# Patient Record
Sex: Male | Born: 1935
Health system: Southern US, Community
[De-identification: ages and names within clinical notes are randomized; demographics above are authoritative.]

## PROBLEM LIST (undated history)

## (undated) DIAGNOSIS — I10 Essential (primary) hypertension: Secondary | ICD-10-CM

## (undated) DIAGNOSIS — I739 Peripheral vascular disease, unspecified: Secondary | ICD-10-CM

## (undated) DIAGNOSIS — J449 Chronic obstructive pulmonary disease, unspecified: Secondary | ICD-10-CM

## (undated) DIAGNOSIS — I82409 Acute embolism and thrombosis of unspecified deep veins of unspecified lower extremity: Secondary | ICD-10-CM

## (undated) DIAGNOSIS — E785 Hyperlipidemia, unspecified: Secondary | ICD-10-CM

## (undated) DIAGNOSIS — I2510 Atherosclerotic heart disease of native coronary artery without angina pectoris: Secondary | ICD-10-CM

## (undated) HISTORY — DX: Essential (primary) hypertension: I10

## (undated) HISTORY — DX: Chronic obstructive pulmonary disease, unspecified: J44.9

## (undated) HISTORY — DX: Atherosclerotic heart disease of native coronary artery without angina pectoris: I25.10

## (undated) HISTORY — PX: TONSILLECTOMY: SUR1361

## (undated) HISTORY — PX: COLONOSCOPY: SHX174

## (undated) HISTORY — DX: Acute embolism and thrombosis of unspecified deep veins of unspecified lower extremity: I82.409

## (undated) HISTORY — DX: Hyperlipidemia, unspecified: E78.5

## (undated) HISTORY — DX: Peripheral vascular disease, unspecified: I73.9

---

## 1992-01-03 HISTORY — PX: CARDIAC CATHETERIZATION: SHX172

## 2004-09-14 ENCOUNTER — Encounter: Admission: RE | Admit: 2004-09-14 | Discharge: 2004-09-14 | Payer: Self-pay | Admitting: Orthopedic Surgery

## 2004-09-15 ENCOUNTER — Ambulatory Visit (HOSPITAL_COMMUNITY): Admission: RE | Admit: 2004-09-15 | Discharge: 2004-09-15 | Payer: Self-pay | Admitting: Orthopedic Surgery

## 2004-09-15 ENCOUNTER — Ambulatory Visit (HOSPITAL_BASED_OUTPATIENT_CLINIC_OR_DEPARTMENT_OTHER): Admission: RE | Admit: 2004-09-15 | Discharge: 2004-09-15 | Payer: Self-pay | Admitting: Orthopedic Surgery

## 2005-05-31 ENCOUNTER — Emergency Department (HOSPITAL_COMMUNITY): Admission: EM | Admit: 2005-05-31 | Discharge: 2005-05-31 | Payer: Self-pay | Admitting: Emergency Medicine

## 2009-01-02 DIAGNOSIS — I82409 Acute embolism and thrombosis of unspecified deep veins of unspecified lower extremity: Secondary | ICD-10-CM

## 2009-01-02 HISTORY — DX: Acute embolism and thrombosis of unspecified deep veins of unspecified lower extremity: I82.409

## 2009-02-26 ENCOUNTER — Observation Stay (HOSPITAL_COMMUNITY): Admission: EM | Admit: 2009-02-26 | Discharge: 2009-02-27 | Payer: Self-pay | Admitting: Emergency Medicine

## 2009-02-26 ENCOUNTER — Ambulatory Visit: Payer: Self-pay | Admitting: Vascular Surgery

## 2009-02-26 ENCOUNTER — Ambulatory Visit (HOSPITAL_COMMUNITY): Admission: RE | Admit: 2009-02-26 | Discharge: 2009-02-26 | Payer: Self-pay | Admitting: Family Medicine

## 2009-02-26 ENCOUNTER — Encounter (INDEPENDENT_AMBULATORY_CARE_PROVIDER_SITE_OTHER): Payer: Self-pay | Admitting: Family Medicine

## 2009-08-03 ENCOUNTER — Encounter: Admission: RE | Admit: 2009-08-03 | Discharge: 2009-08-03 | Payer: Self-pay | Admitting: Family Medicine

## 2010-03-23 LAB — CBC
HCT: 39.4 % (ref 39.0–52.0)
Hemoglobin: 13.2 g/dL (ref 13.0–17.0)
MCHC: 33.5 g/dL (ref 30.0–36.0)
MCV: 90.1 fL (ref 78.0–100.0)
Platelets: 211 10*3/uL (ref 150–400)
RBC: 4.37 MIL/uL (ref 4.22–5.81)
RDW: 14.2 % (ref 11.5–15.5)
WBC: 8.1 10*3/uL (ref 4.0–10.5)

## 2010-03-23 LAB — PROTIME-INR
INR: 1.02 (ref 0.00–1.49)
INR: 1.08 (ref 0.00–1.49)
Prothrombin Time: 13.3 seconds (ref 11.6–15.2)
Prothrombin Time: 13.9 seconds (ref 11.6–15.2)

## 2010-03-23 LAB — DIFFERENTIAL
Basophils Absolute: 0 10*3/uL (ref 0.0–0.1)
Basophils Relative: 1 % (ref 0–1)
Eosinophils Absolute: 0.2 10*3/uL (ref 0.0–0.7)
Eosinophils Relative: 3 % (ref 0–5)
Lymphocytes Relative: 21 % (ref 12–46)
Lymphs Abs: 1.7 10*3/uL (ref 0.7–4.0)
Monocytes Absolute: 0.7 10*3/uL (ref 0.1–1.0)
Monocytes Relative: 8 % (ref 3–12)
Neutro Abs: 5.5 10*3/uL (ref 1.7–7.7)
Neutrophils Relative %: 68 % (ref 43–77)

## 2010-03-23 LAB — BASIC METABOLIC PANEL
BUN: 13 mg/dL (ref 6–23)
CO2: 28 mEq/L (ref 19–32)
Calcium: 8.9 mg/dL (ref 8.4–10.5)
Chloride: 107 mEq/L (ref 96–112)
Creatinine, Ser: 0.77 mg/dL (ref 0.4–1.5)
GFR calc Af Amer: >60 mL/min (ref >60)
GFR calc non Af Amer: >60 mL/min (ref >60)
Glucose, Bld: 107 mg/dL — ABNORMAL HIGH (ref 70–99)
Potassium: 3.4 mEq/L — ABNORMAL LOW (ref 3.5–5.1)
Sodium: 141 mEq/L (ref 135–145)

## 2010-05-02 ENCOUNTER — Other Ambulatory Visit: Payer: Self-pay | Admitting: Gastroenterology

## 2010-05-06 ENCOUNTER — Ambulatory Visit
Admission: RE | Admit: 2010-05-06 | Discharge: 2010-05-06 | Disposition: A | Discharge status: Home / Self Care | Payer: Medicare Other | Source: Ambulatory Visit | Attending: Gastroenterology | Admitting: Gastroenterology

## 2010-05-20 NOTE — Op Note (Signed)
NAME:  Ernest Clark, Ernest Clark NO.:  [id number]   MEDICAL RECORD NO.:  [id number]          PATIENT TYPE:  AMB   LOCATION:  DSC                          FACILITY:  [facility]   PHYSICIAN:  Cindee Salt, M.D.       DATE OF BIRTH:  1931-01-03   DATE OF PROCEDURE:  09/15/2004  DATE OF DISCHARGE:                                 OPERATIVE REPORT   PREOPERATIVE DIAGNOSIS:  Carpal tunnel syndrome right hand.   POSTOPERATIVE DIAGNOSIS:  Carpal tunnel syndrome right hand.   OPERATION:  Decompression right median nerve.   SURGEON:  Kuzma.   ASSISTANT:  Carolyne Fiscal R.N.   ANESTHESIA:  Forearm based IV regional.   HISTORY:  The patient is a 77 year old male with a history of carpal tunnel  syndrome EMG nerve conductions positive which has not responded to  conservative treatment.   PROCEDURE:  The patient is brought to the operating room where a forearm  based IV regional anesthetic was carried out without difficulty. He was  prepped using DuraPrep, supine position, right arm free. Longitudinal  incision was made in the palm and carried down through subcutaneous tissue.  Bleeders were electrocauterized. The palmar fascia was split, superficial  palmar arch identified.  The flexor tendon to the ring and little finger  identified to the ulnar side of median nerve.  The carpal retinaculum was  incised with sharp dissection. A right angle and Sewell retractor placed  between skin and forearm fascia. The fascia was released for approximately a  centimeter and a half proximal to the wrist crease under direct vision.  Canal was explored.  The tenosynovial tissue was thickened. Area of  compression to the nerve was apparent. No further lesions were identified.  The wound was irrigated. The skin then closed with interrupted 5-0 nylon  sutures. Sterile compressive dressing and splint was applied. The patient  tolerated the procedure well and was taken to the recovery for observation  in satisfactory  condition. He is discharged home to return to the [facility] [place] in 1 week on Vicodin.           ______________________________  Cindee Salt, M.D.     GK/MEDQ  D:  09/15/2004  T:  09/15/2004  Job:  [id number]

## 2010-12-08 ENCOUNTER — Encounter: Payer: Self-pay | Admitting: Internal Medicine

## 2010-12-09 ENCOUNTER — Ambulatory Visit (INDEPENDENT_AMBULATORY_CARE_PROVIDER_SITE_OTHER): Payer: Medicare Other | Admitting: Internal Medicine

## 2010-12-09 ENCOUNTER — Encounter: Payer: Self-pay | Admitting: Internal Medicine

## 2010-12-09 DIAGNOSIS — I10 Essential (primary) hypertension: Secondary | ICD-10-CM

## 2010-12-09 DIAGNOSIS — J449 Chronic obstructive pulmonary disease, unspecified: Secondary | ICD-10-CM | POA: Insufficient documentation

## 2010-12-09 MED ORDER — OLMESARTAN MEDOXOMIL 20 MG PO TABS: 20.0000 mg | ORAL_TABLET | Freq: Every day | ORAL | Status: DC

## 2010-12-09 NOTE — Progress Notes (Signed)
  Subjective:    Patient ID: Ernest Clark, male    DOB: December 29, 1931, 77 y.o.   MRN: [id number]  HPI  77 yowm quit smoking around 2002 due to cough which resolved and after seasonal problems with asthma fine for months at a time but had trouble with extreme heat or in fall at a weight of 190 then within a few years  gained significant wt and breathing deteriorated ever since.     12/09/2010 Hodaya Curto/ 1st pulmonary eval for a pattern on ACEI cc  worsening breathing x 7 years with occ nighttime sob but every time sob walking to mailbox x 30 yards stops and rests before rests no better on dulrera -  Has also seen Ramon Dredge with dysphagia but taking meds for it, not sure what, and some better s overt hb.  Minimal cough in am, thick and white with persistent daily sense of chest and sinus congestion but no purulent sputum- some better with saba. No h/o childhood allergies or asthma.  Sleeping ok without nocturnal  or early am exacerbation  of respiratory  c/o's or need for noct saba. Also denies any obvious fluctuation of symptoms with weather or environmental changes or other aggravating or alleviating factors except as outlined above    Review of Systems  Constitutional: Negative for fever, chills, activity change, appetite change and unexpected weight change.  HENT: Positive for congestion. Negative for sore throat, rhinorrhea, sneezing, trouble swallowing, dental problem, voice change and postnasal drip.   Eyes: Negative for visual disturbance.  Respiratory: Positive for cough and shortness of breath. Negative for choking.   Cardiovascular: Negative for chest pain and leg swelling.  Gastrointestinal: Negative for nausea, vomiting and abdominal pain.  Genitourinary: Negative for difficulty urinating.  Musculoskeletal: Negative for arthralgias.  Skin: Negative for rash.  Psychiatric/Behavioral: Negative for behavioral problems and confusion.       Objective:   Physical Exam  Obese wm nad  Wt 262  12/09/2010  HEENT mild turbinate edema.  Oropharynx no thrush or excess pnd or cobblestoning.  No JVD or cervical adenopathy. Mild accessory muscle hypertrophy. Trachea midline, nl thryroid. Chest was hyperinflated by percussion with diminished breath sounds and moderate increased exp time without wheeze. Hoover sign positive at mid inspiration. Regular rate and rhythm without murmur gallop or rub or increase P2 or edema.  Abd: no hsm, nl excursion. Ext warm without cyanosis or clubbing.      cxr report 01/10/10 Mild hyperinflation/ no active dz Assessment & Plan:

## 2010-12-09 NOTE — Patient Instructions (Signed)
Stop lisinopril/ Zesteril/ Prinivil  Benicar 20 mg one each am in place of the lisinopril -if too strong break it in half and only take a half in am.  Stay on dulera Take 2 puffs first thing in am and then another 2 puffs about 12 hours later.   Only use your albuterol(proaire)  as a rescue medication to be used if you can't catch your breath by resting or doing a relaxed purse lip breathing pattern. The less you use it, the better it will work when you need it.   Stop fish oil  Please schedule a follow up office visit in 6 weeks, call sooner if needed with PFT's

## 2010-12-11 NOTE — Assessment & Plan Note (Addendum)
-   HFA 25% 12/09/2010     -12/09/10  Walked RA x 3 laps @ 185 ft each stopped due to  End of study, no desats  COPD appears to be only mild to moderate at most but need pft's to confirm  When respiratory symptoms flare  well after a patient reports complete smoking cessation,  it is very hard to "blame" COPD specifically or airways disorders in general  ie it doesn't make any more sense than hearing a  [employer] driver wrecked his car while driving his kids to school or a surgeon sliced his hand off carving roast beef (it must be rare indeed!)     That is to say, once the high risk activity stops,  the symptoms should not suddenly erupt or markedly worsen.  If so, the differential diagnosis should include  obesity/deconditioning,  LPR/Reflux/Aspiration syndromes,  occult CHF, or  especially side effect of medications commonly used in this population like ACE inhibitors - not clear what role their playing but only way to know is stop them for 6 weeks then return for pft's.  The proper method of use, as well as anticipated side effects, of this metered-dose inhaler are discussed and demonstrated to the patient. Improved only tol 75% with coaching so needs lots more work.    Each maintenance medication was reviewed in detail including most importantly the difference between maintenance and as needed and under what circumstances the prns are to be used.  Please see instructions for details which were reviewed in writing and the patient given a copy.

## 2010-12-11 NOTE — Assessment & Plan Note (Signed)

## 2011-01-26 ENCOUNTER — Ambulatory Visit (INDEPENDENT_AMBULATORY_CARE_PROVIDER_SITE_OTHER): Payer: Medicare Other | Admitting: Internal Medicine

## 2011-01-26 ENCOUNTER — Encounter: Payer: Self-pay | Admitting: Internal Medicine

## 2011-01-26 VITALS — BP 138/70 | HR 101 | Temp 97.7°F | Ht 68.0 in | Wt 256.0 lb

## 2011-01-26 DIAGNOSIS — I10 Essential (primary) hypertension: Secondary | ICD-10-CM | POA: Diagnosis not present

## 2011-01-26 DIAGNOSIS — J449 Chronic obstructive pulmonary disease, unspecified: Secondary | ICD-10-CM

## 2011-01-26 LAB — PULMONARY FUNCTION TEST

## 2011-01-26 MED ORDER — OLMESARTAN MEDOXOMIL 40 MG PO TABS: ORAL_TABLET | ORAL | Status: DC

## 2011-01-26 NOTE — Assessment & Plan Note (Signed)
Adequate control on present rx, reviewed options, doing well on benicar if can afford it under his health plan, otherwise could use alternative generic arb

## 2011-01-26 NOTE — Assessment & Plan Note (Signed)
-   HFA 25% 12/09/2010  > 90% 01/26/2011     -12/09/10  Walked RA x 3 laps @ 185 ft each stopped due to  End of study, no desats   - PFT's 01/26/2011  FEV1  1.71 (68%) ratio 63 improved 13% p B2 > DLCO 87%   ERV 56%  GOLD II with significant reversibility and suboptimal  The proper method of use, as well as anticipated side effects, of this metered-dose inhaler are discussed and demonstrated to the patient. Improved to 90% with extensive coaching but this wasn't consistent.    Each maintenance medication was reviewed in detail including most importantly the difference between maintenance and as needed and under what circumstances the prns are to be used.  Please see instructions for details which were reviewed in writing and the patient given a copy.

## 2011-01-26 NOTE — Progress Notes (Signed)
PFT done today. 

## 2011-01-26 NOTE — Patient Instructions (Signed)
Work on inhaler technique:  relax and gently blow all the way out then take a nice smooth deep breath back in, triggering the inhaler at same time you start breathing in.  Hold for up to 5 seconds if you can.  Rinse and gargle with water when done   If your mouth or throat starts to bother you,   I suggest you time the inhaler to your dental care and after using the inhaler(s) brush teeth and tongue with a baking soda containing toothpaste and when you rinse this out, gargle with it first to see if this helps your mouth and throat.     Only use your albuterol (PROAIRE/ RED INHALER) as a rescue medication to be used if you can't catch your breath by resting or doing a relaxed purse lip breathing pattern. The less you use it, the better it will work when you need it.    Benicar 40 mg one half daily  Please schedule a follow up office visit in 4 weeks, sooner if needed

## 2011-01-26 NOTE — Progress Notes (Signed)
  Subjective:    Patient ID: Ernest Clark, male    DOB: 08-21-1931   MRN: [id number]  HPI  Brief patient profile:  79 yowm quit smoking around 2002 due to cough which resolved and after seasonal problems with asthma fine for months at a time but had trouble with extreme heat or in fall at a weight of 190 then within a few years  gained significant wt and breathing deteriorated ever since.     12/09/2010 Ernest Clark/ 1st pulmonary eval for a pattern on ACEI cc  worsening breathing x 7 years with occ nighttime sob but every time sob walking to mailbox x 30 yards stops and rests before rests no better on dulrera -  Has also seen Ramon Dredge with dysphagia but taking meds for it, not sure what, and some better s overt hb.  Minimal cough in am, thick and white with persistent daily sense of chest and sinus congestion but no purulent sputum- some better with saba. No h/o childhood allergies or asthma. rec Stop lisinopril/ Zesteril/ Prinivil Benicar 20 mg one each am in place of the lisinopril -if too strong break it in half and only take a half in am. Stay on dulera Take 2 puffs first thing in am and then another 2 puffs about 12 hours later.  Only use your albuterol(proaire)  as a rescue medication  Stop fish oil   01/26/2011 f/u ov/Ernest Clark cc breathing about the same using less albuterol, not really limited by breathing. No cough  Sleeping ok without nocturnal  or early am exacerbation  of respiratory  c/o's or need for noct saba. Also denies any obvious fluctuation of symptoms with weather or environmental changes or other aggravating or alleviating factors except as outlined above   ROS  At present neg for  any significant sore throat, dysphagia, itching, sneezing,  nasal congestion or excess/ purulent secretions,  fever, chills, sweats, unintended wt loss, pleuritic or exertional cp, hempoptysis, orthopnea pnd or leg swelling.  Also denies presyncope, palpitations, heartburn, abdominal pain, nausea, vomiting,  diarrhea  or change in bowel or urinary habits, dysuria,hematuria,  rash, arthralgias, visual complaints, headache, numbness weakness or ataxia.             Objective:   Physical Exam  Obese wm nad  Wt 262 12/09/2010 > 01/26/2011   256  HEENT mild turbinate edema.  Oropharynx no thrush or excess pnd or cobblestoning.  No JVD or cervical adenopathy. Mild accessory muscle hypertrophy. Trachea midline, nl thryroid. Chest was hyperinflated by percussion with diminished breath sounds and moderate increased exp time without wheeze. Hoover sign positive at mid inspiration. Regular rate and rhythm without murmur gallop or rub or increase P2 or edema.  Abd: no hsm, nl excursion. Ext warm without cyanosis or clubbing.      cxr report 01/10/10 Mild hyperinflation/ no active dz Assessment & Plan:

## 2011-02-07 DIAGNOSIS — E119 Type 2 diabetes mellitus without complications: Secondary | ICD-10-CM | POA: Diagnosis not present

## 2011-02-07 DIAGNOSIS — L0291 Cutaneous abscess, unspecified: Secondary | ICD-10-CM | POA: Diagnosis not present

## 2011-02-07 DIAGNOSIS — L0390 Cellulitis, unspecified: Secondary | ICD-10-CM | POA: Diagnosis not present

## 2011-02-23 ENCOUNTER — Encounter: Payer: Self-pay | Admitting: Internal Medicine

## 2011-02-23 ENCOUNTER — Ambulatory Visit (INDEPENDENT_AMBULATORY_CARE_PROVIDER_SITE_OTHER): Payer: Medicare Other | Admitting: Internal Medicine

## 2011-02-23 DIAGNOSIS — J449 Chronic obstructive pulmonary disease, unspecified: Secondary | ICD-10-CM

## 2011-02-23 DIAGNOSIS — I10 Essential (primary) hypertension: Secondary | ICD-10-CM

## 2011-02-23 MED ORDER — MOMETASONE FURO-FORMOTEROL FUM 200-5 MCG/ACT IN AERO: 2.0000 | INHALATION_SPRAY | Freq: Two times a day (BID) | RESPIRATORY_TRACT | Status: DC

## 2011-02-23 NOTE — Progress Notes (Signed)
  Subjective:    Patient ID: Ernest Clark, male    DOB: [birth year]   MRN: [id number]  HPI  Brief patient profile:  79 yowm quit smoking around 2002 due to cough which resolved and after seasonal problems with asthma fine for months at a time but had trouble with extreme heat or in fall at a weight of 190 then within a few years  gained significant wt and breathing deteriorated ever since. Dx GOLD II COPD 01/25/10    12/09/2010 Ernest Clark/ 1st pulmonary eval for a pattern on ACEI cc  worsening breathing x 7 years with occ nighttime sob but every time sob walking to mailbox x 30 yards stops and rests before rests no better on dulrera -  Has also seen Ernest Clark with dysphagia but taking meds for it, not sure what, and some better s overt hb.  Minimal cough in am, thick and white with persistent daily sense of chest and sinus congestion but no purulent sputum- some better with saba. No h/o childhood allergies or asthma. rec Stop lisinopril/ Zesteril/ Prinivil Benicar 20 mg one each am in place of the lisinopril -if too strong break it in half and only take a half in am. Stay on dulera Take 2 puffs first thing in am and then another 2 puffs about 12 hours later.  Only use your albuterol(proaire)  as a rescue medication  Stop fish oil   01/26/2011 f/u ov/Ernest Clark cc breathing about the same using less albuterol, not really limited by breathing. No cough rec Work on inhaler technique:  Only use your albuterol (PROAIRE/ RED INHALER) as a rescue medication  Benicar 40 mg one half daily   02/23/2011 f/u ov/Ernest Clark cc breathing much better  On dulera 200 2bid still feels needs proaire with exertion, no cough. No variability to doe   Sleeping ok without nocturnal  or early am exacerbation  of respiratory  c/o's or need for noct saba. Also denies any obvious fluctuation of symptoms with weather or environmental changes or other aggravating or alleviating factors except as outlined above   ROS  At present neg for   any significant sore throat, dysphagia, itching, sneezing,  nasal congestion or excess/ purulent secretions,  fever, chills, sweats, unintended wt loss, pleuritic or exertional cp, hempoptysis, orthopnea pnd or leg swelling.  Also denies presyncope, palpitations, heartburn, abdominal pain, nausea, vomiting, diarrhea  or change in bowel or urinary habits, dysuria,hematuria,  rash, arthralgias, visual complaints, headache, numbness weakness or ataxia.             Objective:   Physical Exam  Obese wm nad  Wt 262 12/09/2010 > 01/26/2011   256 > 02/23/2011  259 HEENT mild turbinate edema.  Oropharynx no thrush or excess pnd or cobblestoning.  No JVD or cervical adenopathy. Mild accessory muscle hypertrophy. Trachea midline, nl thryroid. Chest was hyperinflated by percussion with diminished breath sounds and moderate increased exp time without wheeze. Hoover sign positive at mid inspiration. Regular rate and rhythm without murmur gallop or rub or increase P2 or edema.  Abd: no hsm, nl excursion. Ext warm without cyanosis or clubbing.      cxr report 01/10/10 Mild hyperinflation/ no active dz Assessment & Plan:

## 2011-02-23 NOTE — Patient Instructions (Signed)
If you are satisfied with your treatment plan let your doctor know and he/she can either refill your medications or you can return here when your prescription runs out.     If in any way you are not 100% satisfied,  please tell us.  If 100% better, tell your friends!  

## 2011-02-25 NOTE — Assessment & Plan Note (Signed)
-   HFA 25% 12/09/2010  > 90% 01/26/2011     -12/09/10  Walked RA x 3 laps @ 185 ft each stopped due to  End of study, no desats   - PFT's 01/26/2011  FEV1  1.71 (68%) ratio 63 improved 13% p B2 > DLCO 87%   ERV 56%  GOLD II copd with asthmatic component eliminated with dulera 200 2bid and atypical symptoms eliminated off the acei  His need for proair is more likely obesity/deconditioning and not related to airways at all, reviewed

## 2011-02-25 NOTE — Assessment & Plan Note (Signed)
Stop acei 12/09/2010 due to ? Pseudoasthma > resolved 01/26/2011   ACE inhibitors are problematic in  pts with airway complaints because  even experienced pulmonologists can't always distinguish ace effects from copd/asthma/pnds/ allergies etc.  By themselves they don't actually cause a problem, much like oxygen can't by itself start a fire, but they certainly serve as a powerful catalyst or enhancer for any "fire"  or inflammatory process in the upper airway, be it caused by an ET  tube or more commonly reflux (especially in the obese or pts with known GERD or who are on biphoshonates) or URI's, due to interference with bradykinin clearance.  The effects of acei on bradykinin levels occurs in 100% of pt's on acei (unless they surreptitiously stop the med!) but the classic cough is only reported in 5%.  This leaves 95% of pts on acei's  with a variety of syndromes including no identifiable symptom in most  vs non-specific symptoms that wax and wane depending on what other insult is occuring at the level of the upper airway.  His symptoms of "airways" problems are so much better off acei I would avoid this class indefinitely in favor of arb but defer final choice of hbp rx to Dr Clarene Duke

## 2011-05-09 DIAGNOSIS — E119 Type 2 diabetes mellitus without complications: Secondary | ICD-10-CM | POA: Diagnosis not present

## 2011-05-09 DIAGNOSIS — E785 Hyperlipidemia, unspecified: Secondary | ICD-10-CM | POA: Diagnosis not present

## 2011-05-09 DIAGNOSIS — N529 Male erectile dysfunction, unspecified: Secondary | ICD-10-CM | POA: Diagnosis not present

## 2011-06-14 DIAGNOSIS — IMO0001 Reserved for inherently not codable concepts without codable children: Secondary | ICD-10-CM | POA: Diagnosis not present

## 2011-06-14 DIAGNOSIS — E669 Obesity, unspecified: Secondary | ICD-10-CM | POA: Diagnosis not present

## 2011-06-14 DIAGNOSIS — R809 Proteinuria, unspecified: Secondary | ICD-10-CM | POA: Diagnosis not present

## 2011-07-25 ENCOUNTER — Other Ambulatory Visit: Payer: Self-pay | Admitting: Internal Medicine

## 2011-07-25 DIAGNOSIS — M7989 Other specified soft tissue disorders: Secondary | ICD-10-CM | POA: Diagnosis not present

## 2011-07-25 DIAGNOSIS — E669 Obesity, unspecified: Secondary | ICD-10-CM | POA: Diagnosis not present

## 2011-07-25 DIAGNOSIS — IMO0001 Reserved for inherently not codable concepts without codable children: Secondary | ICD-10-CM | POA: Diagnosis not present

## 2011-07-25 DIAGNOSIS — R809 Proteinuria, unspecified: Secondary | ICD-10-CM | POA: Diagnosis not present

## 2011-07-25 DIAGNOSIS — M79669 Pain in unspecified lower leg: Secondary | ICD-10-CM

## 2011-07-25 DIAGNOSIS — Z86718 Personal history of other venous thrombosis and embolism: Secondary | ICD-10-CM | POA: Diagnosis not present

## 2011-07-26 ENCOUNTER — Ambulatory Visit
Admission: RE | Admit: 2011-07-26 | Discharge: 2011-07-26 | Disposition: A | Discharge status: Home / Self Care | Payer: Medicare Other | Source: Ambulatory Visit | Attending: Internal Medicine | Admitting: Internal Medicine

## 2011-07-26 DIAGNOSIS — IMO0001 Reserved for inherently not codable concepts without codable children: Secondary | ICD-10-CM | POA: Diagnosis not present

## 2011-07-26 DIAGNOSIS — R609 Edema, unspecified: Secondary | ICD-10-CM | POA: Diagnosis not present

## 2011-07-26 DIAGNOSIS — M79669 Pain in unspecified lower leg: Secondary | ICD-10-CM

## 2011-07-26 DIAGNOSIS — E669 Obesity, unspecified: Secondary | ICD-10-CM | POA: Diagnosis not present

## 2011-07-26 DIAGNOSIS — Z86718 Personal history of other venous thrombosis and embolism: Secondary | ICD-10-CM | POA: Diagnosis not present

## 2011-07-26 DIAGNOSIS — R809 Proteinuria, unspecified: Secondary | ICD-10-CM | POA: Diagnosis not present

## 2011-07-26 DIAGNOSIS — M79609 Pain in unspecified limb: Secondary | ICD-10-CM | POA: Diagnosis not present

## 2011-08-09 DIAGNOSIS — IMO0001 Reserved for inherently not codable concepts without codable children: Secondary | ICD-10-CM | POA: Diagnosis not present

## 2011-10-04 DIAGNOSIS — E669 Obesity, unspecified: Secondary | ICD-10-CM | POA: Diagnosis not present

## 2011-10-04 DIAGNOSIS — E119 Type 2 diabetes mellitus without complications: Secondary | ICD-10-CM | POA: Diagnosis not present

## 2011-10-04 DIAGNOSIS — R809 Proteinuria, unspecified: Secondary | ICD-10-CM | POA: Diagnosis not present

## 2011-10-10 DIAGNOSIS — D3701 Neoplasm of uncertain behavior of lip: Secondary | ICD-10-CM | POA: Diagnosis not present

## 2011-10-10 DIAGNOSIS — H9319 Tinnitus, unspecified ear: Secondary | ICD-10-CM | POA: Diagnosis not present

## 2011-10-10 DIAGNOSIS — H6120 Impacted cerumen, unspecified ear: Secondary | ICD-10-CM | POA: Diagnosis not present

## 2011-10-10 DIAGNOSIS — D3709 Neoplasm of uncertain behavior of other specified sites of the oral cavity: Secondary | ICD-10-CM | POA: Diagnosis not present

## 2011-10-24 DIAGNOSIS — H905 Unspecified sensorineural hearing loss: Secondary | ICD-10-CM | POA: Diagnosis not present

## 2011-10-24 DIAGNOSIS — D3701 Neoplasm of uncertain behavior of lip: Secondary | ICD-10-CM | POA: Diagnosis not present

## 2011-10-24 DIAGNOSIS — D3709 Neoplasm of uncertain behavior of other specified sites of the oral cavity: Secondary | ICD-10-CM | POA: Diagnosis not present

## 2011-12-11 DIAGNOSIS — R05 Cough: Secondary | ICD-10-CM | POA: Diagnosis not present

## 2011-12-11 DIAGNOSIS — E119 Type 2 diabetes mellitus without complications: Secondary | ICD-10-CM | POA: Diagnosis not present

## 2011-12-11 DIAGNOSIS — E669 Obesity, unspecified: Secondary | ICD-10-CM | POA: Diagnosis not present

## 2011-12-11 DIAGNOSIS — R809 Proteinuria, unspecified: Secondary | ICD-10-CM | POA: Diagnosis not present

## 2011-12-11 DIAGNOSIS — R059 Cough, unspecified: Secondary | ICD-10-CM | POA: Diagnosis not present

## 2011-12-12 DIAGNOSIS — J40 Bronchitis, not specified as acute or chronic: Secondary | ICD-10-CM | POA: Diagnosis not present

## 2011-12-12 DIAGNOSIS — J441 Chronic obstructive pulmonary disease with (acute) exacerbation: Secondary | ICD-10-CM | POA: Diagnosis not present

## 2012-01-22 DIAGNOSIS — R0602 Shortness of breath: Secondary | ICD-10-CM | POA: Diagnosis not present

## 2012-01-22 DIAGNOSIS — R0989 Other specified symptoms and signs involving the circulatory and respiratory systems: Secondary | ICD-10-CM | POA: Diagnosis not present

## 2012-03-22 DIAGNOSIS — E1149 Type 2 diabetes mellitus with other diabetic neurological complication: Secondary | ICD-10-CM | POA: Diagnosis not present

## 2012-03-22 DIAGNOSIS — R21 Rash and other nonspecific skin eruption: Secondary | ICD-10-CM | POA: Diagnosis not present

## 2012-03-22 DIAGNOSIS — E669 Obesity, unspecified: Secondary | ICD-10-CM | POA: Diagnosis not present

## 2012-03-25 DIAGNOSIS — M25519 Pain in unspecified shoulder: Secondary | ICD-10-CM | POA: Diagnosis not present

## 2012-03-25 DIAGNOSIS — J449 Chronic obstructive pulmonary disease, unspecified: Secondary | ICD-10-CM | POA: Diagnosis not present

## 2012-04-05 DIAGNOSIS — E669 Obesity, unspecified: Secondary | ICD-10-CM | POA: Diagnosis not present

## 2012-04-05 DIAGNOSIS — R21 Rash and other nonspecific skin eruption: Secondary | ICD-10-CM | POA: Diagnosis not present

## 2012-04-05 DIAGNOSIS — IMO0001 Reserved for inherently not codable concepts without codable children: Secondary | ICD-10-CM | POA: Diagnosis not present

## 2012-06-05 DIAGNOSIS — R21 Rash and other nonspecific skin eruption: Secondary | ICD-10-CM | POA: Diagnosis not present

## 2012-06-05 DIAGNOSIS — E669 Obesity, unspecified: Secondary | ICD-10-CM | POA: Diagnosis not present

## 2012-06-05 DIAGNOSIS — E1149 Type 2 diabetes mellitus with other diabetic neurological complication: Secondary | ICD-10-CM | POA: Diagnosis not present

## 2012-09-05 DIAGNOSIS — E063 Autoimmune thyroiditis: Secondary | ICD-10-CM | POA: Diagnosis not present

## 2012-09-05 DIAGNOSIS — E1149 Type 2 diabetes mellitus with other diabetic neurological complication: Secondary | ICD-10-CM | POA: Diagnosis not present

## 2012-09-05 DIAGNOSIS — E669 Obesity, unspecified: Secondary | ICD-10-CM | POA: Diagnosis not present

## 2012-09-05 DIAGNOSIS — E038 Other specified hypothyroidism: Secondary | ICD-10-CM | POA: Diagnosis not present

## 2012-09-23 DIAGNOSIS — J441 Chronic obstructive pulmonary disease with (acute) exacerbation: Secondary | ICD-10-CM | POA: Diagnosis not present

## 2012-09-23 DIAGNOSIS — J069 Acute upper respiratory infection, unspecified: Secondary | ICD-10-CM | POA: Diagnosis not present

## 2012-09-23 DIAGNOSIS — J449 Chronic obstructive pulmonary disease, unspecified: Secondary | ICD-10-CM | POA: Diagnosis not present

## 2012-11-18 DIAGNOSIS — K219 Gastro-esophageal reflux disease without esophagitis: Secondary | ICD-10-CM | POA: Diagnosis not present

## 2013-03-06 DIAGNOSIS — E063 Autoimmune thyroiditis: Secondary | ICD-10-CM | POA: Diagnosis not present

## 2013-03-06 DIAGNOSIS — E669 Obesity, unspecified: Secondary | ICD-10-CM | POA: Diagnosis not present

## 2013-03-06 DIAGNOSIS — E1149 Type 2 diabetes mellitus with other diabetic neurological complication: Secondary | ICD-10-CM | POA: Diagnosis not present

## 2013-03-06 DIAGNOSIS — Z6835 Body mass index (BMI) 35.0-35.9, adult: Secondary | ICD-10-CM | POA: Diagnosis not present

## 2013-03-06 DIAGNOSIS — E038 Other specified hypothyroidism: Secondary | ICD-10-CM | POA: Diagnosis not present

## 2013-08-06 DIAGNOSIS — E1149 Type 2 diabetes mellitus with other diabetic neurological complication: Secondary | ICD-10-CM | POA: Diagnosis not present

## 2013-08-06 DIAGNOSIS — E1142 Type 2 diabetes mellitus with diabetic polyneuropathy: Secondary | ICD-10-CM | POA: Diagnosis not present

## 2013-08-06 DIAGNOSIS — E063 Autoimmune thyroiditis: Secondary | ICD-10-CM | POA: Diagnosis not present

## 2013-08-06 DIAGNOSIS — E038 Other specified hypothyroidism: Secondary | ICD-10-CM | POA: Diagnosis not present

## 2013-08-06 DIAGNOSIS — Z6834 Body mass index (BMI) 34.0-34.9, adult: Secondary | ICD-10-CM | POA: Diagnosis not present

## 2013-08-06 DIAGNOSIS — E669 Obesity, unspecified: Secondary | ICD-10-CM | POA: Diagnosis not present

## 2013-08-20 ENCOUNTER — Encounter: Payer: Self-pay | Admitting: Podiatry

## 2013-08-20 ENCOUNTER — Ambulatory Visit (INDEPENDENT_AMBULATORY_CARE_PROVIDER_SITE_OTHER): Payer: Medicare Other

## 2013-08-20 ENCOUNTER — Ambulatory Visit (INDEPENDENT_AMBULATORY_CARE_PROVIDER_SITE_OTHER): Payer: Medicare Other | Admitting: Podiatry

## 2013-08-20 ENCOUNTER — Telehealth (HOSPITAL_COMMUNITY): Payer: Self-pay | Admitting: *Deleted

## 2013-08-20 VITALS — BP 133/67 | HR 89 | Resp 12

## 2013-08-20 DIAGNOSIS — M79673 Pain in unspecified foot: Secondary | ICD-10-CM

## 2013-08-20 DIAGNOSIS — M2010 Hallux valgus (acquired), unspecified foot: Secondary | ICD-10-CM

## 2013-08-20 DIAGNOSIS — M79609 Pain in unspecified limb: Secondary | ICD-10-CM

## 2013-08-20 DIAGNOSIS — E1149 Type 2 diabetes mellitus with other diabetic neurological complication: Secondary | ICD-10-CM | POA: Diagnosis not present

## 2013-08-20 MED ORDER — GABAPENTIN 300 MG PO CAPS: 300.0000 mg | ORAL_CAPSULE | Freq: Every day | ORAL | Status: DC

## 2013-08-20 NOTE — Progress Notes (Signed)
   Subjective:    Patient ID: Pauline Aus., male    DOB: July 25, 1931, 80 y.o.   MRN: [id number]  HPI N-SORE, BURNING SENSATION. L-B/L FOOT BUNION D-1 YEAR O-SLOWLY C-WORSE A-PRESSURE T-NONE  ALSO, TOENAILS TRIM IF POSSIBLE. B/L   Review of Systems  Constitutional: Positive for unexpected weight change.  HENT: Positive for hearing loss.   Respiratory: Positive for wheezing.   Musculoskeletal: Positive for gait problem and myalgias.  All other systems reviewed and are negative.      Objective:   Physical Exam  Orientated x3 white male  Vascular: DP and PT pulses 0/4 bilaterally  Neurological: Sensation to 10 g monofilament wire intact 4/5 bilaterally Ankle reflexes equal and reactive bilaterally Vibratory sensation intact bilaterally  Dermatological: Elongated, brittle, discolored toenails 6-10  Varicose veins distal lower leg ankle and dorsal feet bilaterally  Musculoskeletal: HAV deformities bilaterally Atrophic fat-pad MPJs bilaterally without any palpable plantar tenderness  X-ray examination weightbearing left foot  Intact bony structure without any fracture and/or dislocation  HAV deformity  Hammertoe deformity second  Inferior calcaneal spur  Radiographic impression:  No acute bony abnormality noted left foot  X-ray examination right foot weightbearing  Intact bony structure without fracture and/or dislocation  HAV deformity  Hammertoe deformity second  Inferior calcaneal spur  Radiographic impression:  No acute bony abnormality noted in the right foot      Assessment & Plan:   Assessment: Absent pedal pulses bilaterally rule out peripheral arterial disease Diabetic peripheral neuropathy  Plan: Patient referred to vascular lab for lower extremity arterial Doppler for the indication of diabetic with nonpalpable pedal pulses Gabapentin 300 mg #30 take one at bedtime  Reappoint x30 days Notify patient of results of arterial Doppler

## 2013-08-20 NOTE — Patient Instructions (Signed)
The vascular lab we'll contact you to schedule a lower extremity arterial Doppler Begin gabapentin 300 mg at bedtime for burning  Diabetes and Foot Care Diabetes may cause you to have problems because of poor blood supply (circulation) to your feet and legs. This may cause the skin on your feet to become thinner, break easier, and heal more slowly. Your skin may become dry, and the skin may peel and crack. You may also have nerve damage in your legs and feet causing decreased feeling in them. You may not notice minor injuries to your feet that could lead to infections or more serious problems. Taking care of your feet is one of the most important things you can do for yourself.  HOME CARE INSTRUCTIONS  Wear shoes at all times, even in the house. Do not go barefoot. Bare feet are easily injured.  Check your feet daily for blisters, cuts, and redness. If you cannot see the bottom of your feet, use a mirror or ask someone for help.  Wash your feet with warm water (do not use hot water) and mild soap. Then pat your feet and the areas between your toes until they are completely dry. Do not soak your feet as this can dry your skin.  Apply a moisturizing lotion or petroleum jelly (that does not contain alcohol and is unscented) to the skin on your feet and to dry, brittle toenails. Do not apply lotion between your toes.  Trim your toenails straight across. Do not dig under them or around the cuticle. File the edges of your nails with an emery board or nail file.  Do not cut corns or calluses or try to remove them with medicine.  Wear clean socks or stockings every day. Make sure they are not too tight. Do not wear knee-high stockings since they may decrease blood flow to your legs.  Wear shoes that fit properly and have enough cushioning. To break in new shoes, wear them for just a few hours a day. This prevents you from injuring your feet. Always look in your shoes before you put them on to be sure  there are no objects inside.  Do not cross your legs. This may decrease the blood flow to your feet.  If you find a minor scrape, cut, or break in the skin on your feet, keep it and the skin around it clean and dry. These areas may be cleansed with mild soap and water. Do not cleanse the area with peroxide, alcohol, or iodine.  When you remove an adhesive bandage, be sure not to damage the skin around it.  If you have a wound, look at it several times a day to make sure it is healing.  Do not use heating pads or hot water bottles. They may burn your skin. If you have lost feeling in your feet or legs, you may not know it is happening until it is too late.  Make sure your health care provider performs a complete foot exam at least annually or more often if you have foot problems. Report any cuts, sores, or bruises to your health care provider immediately. SEEK MEDICAL CARE IF:   You have an injury that is not healing.  You have cuts or breaks in the skin.  You have an ingrown nail.  You notice redness on your legs or feet.  You feel burning or tingling in your legs or feet.  You have pain or cramps in your legs and feet.  Your  legs or feet are numb.  Your feet always feel cold. SEEK IMMEDIATE MEDICAL CARE IF:   There is increasing redness, swelling, or pain in or around a wound.  There is a red line that goes up your leg.  Pus is coming from a wound.  You develop a fever or as directed by your health care provider.  You notice a bad smell coming from an ulcer or wound. Document Released: 12/17/1999 Document Revised: 08/21/2012 Document Reviewed: 05/28/2012 [facility] Patient Information 2015 [place], [place]. This information is not intended to replace advice given to you by your health care provider. Make sure you discuss any questions you have with your health care provider.

## 2013-08-27 ENCOUNTER — Ambulatory Visit (HOSPITAL_COMMUNITY)
Admission: RE | Admit: 2013-08-27 | Discharge: 2013-08-27 | Disposition: A | Discharge status: Home / Self Care | Payer: Medicare Other | Source: Ambulatory Visit | Attending: Cardiovascular Disease | Admitting: Cardiovascular Disease

## 2013-08-27 DIAGNOSIS — M79609 Pain in unspecified limb: Secondary | ICD-10-CM | POA: Diagnosis not present

## 2013-08-27 DIAGNOSIS — M79673 Pain in unspecified foot: Secondary | ICD-10-CM

## 2013-08-27 DIAGNOSIS — R0989 Other specified symptoms and signs involving the circulatory and respiratory systems: Secondary | ICD-10-CM | POA: Diagnosis not present

## 2013-08-27 NOTE — Progress Notes (Signed)
Lower Extremity Arterial Duplex Completed. °Brianna L Mazza,RVT °

## 2013-09-01 ENCOUNTER — Telehealth: Payer: Self-pay | Admitting: *Deleted

## 2013-09-01 ENCOUNTER — Telehealth (HOSPITAL_COMMUNITY): Payer: Self-pay | Admitting: *Deleted

## 2013-09-01 DIAGNOSIS — I739 Peripheral vascular disease, unspecified: Secondary | ICD-10-CM

## 2013-09-01 NOTE — Telephone Encounter (Signed)
I'm calling on behalf of Ernest Clark.  I'd like to speak to Dr. Phoebe Perch nurse about his vascular ultrasound that he had the other day.  We were wondering if you have any results.  My number is [phone].  You can reach them at their home number but I'm leaving at 10 but he would like me to talk to you so he can understand everything.  Give [place] a call back thank you.

## 2013-09-02 ENCOUNTER — Telehealth: Payer: Self-pay | Admitting: *Deleted

## 2013-09-02 NOTE — Telephone Encounter (Signed)
I called and informed her we have not received the results.  She asked if we would call him when the results came in or can we call her with the results. I told her we will contact the patient when we get the results.

## 2013-09-03 NOTE — Telephone Encounter (Signed)
I informed her that his doppler study was abnormal.  Dr. Amalia Hailey wants to refer him to Dr. Quay Burow for follow-up.  I told her they will call him to schedule the appointment.  She said she will let him know.

## 2013-09-03 NOTE — Telephone Encounter (Signed)
Message copied by Lolita Rieger on Wed Sep 03, 2013 10:21 AM ------      Message from: Gean Birchwood      Created: Tue Sep 02, 2013 11:45 AM       The results of the arterial Doppler dated 08/26/2013            Summary:      This is an abnormal lower extremity arterial Doppler.      Contact Dr. Gwenlyn Found immediately and patient for followup evaluation based on this abnormal arterial Doppler ------

## 2013-09-04 NOTE — Telephone Encounter (Signed)
Dr Gwenlyn Found reviewed patient's dopplers and he needs to come in for an appointment.  Patient notified and has an appt with Dr Gwenlyn Found on 9/24

## 2013-09-17 ENCOUNTER — Ambulatory Visit: Payer: Medicare Other | Admitting: Podiatry

## 2013-09-25 ENCOUNTER — Encounter: Payer: Self-pay | Admitting: Cardiovascular Disease

## 2013-09-25 ENCOUNTER — Ambulatory Visit (INDEPENDENT_AMBULATORY_CARE_PROVIDER_SITE_OTHER): Payer: Medicare Other | Admitting: Cardiovascular Disease

## 2013-09-25 VITALS — BP 135/80 | HR 88 | Ht 68.0 in | Wt 230.2 lb

## 2013-09-25 DIAGNOSIS — E785 Hyperlipidemia, unspecified: Secondary | ICD-10-CM

## 2013-09-25 DIAGNOSIS — I739 Peripheral vascular disease, unspecified: Secondary | ICD-10-CM | POA: Diagnosis not present

## 2013-09-25 DIAGNOSIS — I10 Essential (primary) hypertension: Secondary | ICD-10-CM | POA: Diagnosis not present

## 2013-09-25 DIAGNOSIS — I2510 Atherosclerotic heart disease of native coronary artery without angina pectoris: Secondary | ICD-10-CM | POA: Insufficient documentation

## 2013-09-25 DIAGNOSIS — I2583 Coronary atherosclerosis due to lipid rich plaque: Secondary | ICD-10-CM

## 2013-09-25 NOTE — Assessment & Plan Note (Signed)
The patient was referred by Dr. Amalia Hailey for evaluation of claudication. He had lower extremity arterial Doppler studies performed in our office 08/27/13 revealing a right ABI of 0.65 with moderate SFA disease and left ABI of 0.51 with occluded distal SFA. He does have diabetic peripheral neuropathy as well. Complains of hip and buttock pain primarily while lying flat and sitting which sounds more like cervical claudication. He has no aortoiliac disease which would contribute to the thigh claudication.

## 2013-09-25 NOTE — Progress Notes (Signed)
09/25/2013 Pauline Aus.   Nov 11, 1931  [id number]  Primary Physician LITTLE, Jeanella Craze, MD Primary Cardiologist: Lorretta Harp MD Renae Gloss   HPI:  Mr. Whitenight is a delightful 80 year old moderately overweight divorced Caucasian male father of 2 children, grandfather high grandchildren referred by Dr. Amalia Hailey, podiatrist, for peripheral vascular evaluation because of claudication. He is retired at working as a Furniture conservator/restorer at [employer]. His primary care physician is Dr. Hulan Fess. His cardiac risk factor profile is notable for over 100 pack years of tobacco abuse having quit 5 years ago as well as history of hypertension, diabetes or hyperlipidemia. He has never had a heart attack or stroke. He denies chest pain or shortness of breath. He has had a remote PCI. He also has what sounds like diabetic peripheral neuropathy with burning in his feet. He had Dopplers in our office performed 08/27/13 revealing a right ABI 0.65 with moderate SFA disease, left ABI 0.51 with occluded distal left SFA. He really does not complain of calf claudication.   Current Outpatient Prescriptions  Medication Sig Dispense Refill  . albuterol (PROAIR HFA) 108 (90 BASE) MCG/ACT inhaler Inhale 2 puffs into the lungs every 6 (six) hours as needed.        Marland Kitchen aspirin 81 MG tablet Take 162 mg by mouth daily.        . betamethasone dipropionate (DIPROLENE) 0.05 % cream Apply topically 2 (two) times daily.        Marland Kitchen ezetimibe (ZETIA) 10 MG tablet Take 10 mg by mouth daily.        Marland Kitchen gabapentin (NEURONTIN) 300 MG capsule Take 1 capsule (300 mg total) by mouth at bedtime.  30 capsule  1  . glimepiride (AMARYL) 2 MG tablet Take 2 mg by mouth 2 (two) times daily.        Marland Kitchen HYDROcodone-acetaminophen (NORCO) 5-325 MG per tablet Take 1 tablet by mouth every 6 (six) hours as needed.        . indomethacin (INDOCIN) 25 MG capsule Take 25 mg by mouth as needed.        . Mometasone Furo-Formoterol Fum (DULERA) 200-5  MCG/ACT AERO Inhale 2 puffs into the lungs 2 (two) times daily.  1 Inhaler  11  . montelukast (SINGULAIR) 10 MG tablet Take 10 mg by mouth daily.        Marland Kitchen olmesartan (BENICAR) 40 MG tablet One half daily  15 tablet  5  . rosuvastatin (CRESTOR) 40 MG tablet Take 40 mg by mouth daily.        . sildenafil (VIAGRA) 100 MG tablet Take 100 mg by mouth daily as needed.        . sitaGLIPtin (JANUVIA) 100 MG tablet Take 100 mg by mouth daily.       No current facility-administered medications for this visit.    Allergies  Allergen Reactions  . Actos [Pioglitazone Hydrochloride]   . Metformin And Related     History   Social History  . Marital Status: Divorced    Spouse Name: N/A    Number of Children: 2  . Years of Education: N/A   Occupational History  . retired from [employer]  . Smoking status: Former Smoker -- 2.00 packs/day for 60 years    Types: Cigarettes    Quit date: 01/02/2001  . Smokeless tobacco: Former Systems developer    Types: Chew  . Alcohol Use: No  . Drug Use: No  .  Sexual Activity: Not on file   Other Topics Concern  . Not on file   Social History Narrative  . No narrative on file     Review of Systems: General: negative for chills, fever, night sweats or weight changes.  Cardiovascular: negative for chest pain, dyspnea on exertion, edema, orthopnea, palpitations, paroxysmal nocturnal dyspnea or shortness of breath Dermatological: negative for rash Respiratory: negative for cough or wheezing Urologic: negative for hematuria Abdominal: negative for nausea, vomiting, diarrhea, bright red blood per rectum, melena, or hematemesis Neurologic: negative for visual changes, syncope, or dizziness All other systems reviewed and are otherwise negative except as noted above.    Blood pressure 135/80, pulse 88, height 5\' 8"  (1.727 m), weight 230 lb 3.2 oz (104.418 kg).  General appearance: alert and no distress Neck: no adenopathy, no carotid  bruit, no JVD, supple, symmetrical, trachea midline and thyroid not enlarged, symmetric, no tenderness/mass/nodules Lungs: clear to auscultation bilaterally Heart: regular rate and rhythm, S1, S2 normal, no murmur, click, rub or gallop Extremities: extremities normal, atraumatic, no cyanosis or edema and 1+ pedal pulses bilaterally  EKG normal sinus rhythm at 88 without ST or T wave changes  ASSESSMENT AND PLAN:   HBP (high blood pressure) Well-controlled on current medications  Peripheral arterial disease The patient was referred by Dr. Amalia Hailey for evaluation of claudication. He had lower extremity arterial Doppler studies performed in our office 08/27/13 revealing a right ABI of 0.65 with moderate SFA disease and left ABI of 0.51 with occluded distal SFA. He does have diabetic peripheral neuropathy as well. Complains of hip and buttock pain primarily while lying flat and sitting which sounds more like cervical claudication. He has no aortoiliac disease which would contribute to the thigh claudication.  Coronary artery disease History of remote PCI      Lorretta Harp MD [facility], [facility] 09/25/2013 3:53 PM

## 2013-09-25 NOTE — Patient Instructions (Signed)
Follow up with Dr Berry as needed.  

## 2013-09-25 NOTE — Assessment & Plan Note (Signed)
History of remote PCI

## 2013-09-25 NOTE — Assessment & Plan Note (Signed)
Well-controlled on current medications 

## 2013-10-01 ENCOUNTER — Encounter: Payer: Self-pay | Admitting: Podiatry

## 2013-10-01 ENCOUNTER — Ambulatory Visit (INDEPENDENT_AMBULATORY_CARE_PROVIDER_SITE_OTHER): Payer: Medicare Other | Admitting: Podiatry

## 2013-10-01 VITALS — BP 142/86 | HR 69 | Resp 12

## 2013-10-01 DIAGNOSIS — B351 Tinea unguium: Secondary | ICD-10-CM

## 2013-10-01 DIAGNOSIS — M79676 Pain in unspecified toe(s): Secondary | ICD-10-CM

## 2013-10-01 DIAGNOSIS — M79609 Pain in unspecified limb: Secondary | ICD-10-CM

## 2013-10-01 DIAGNOSIS — E1142 Type 2 diabetes mellitus with diabetic polyneuropathy: Secondary | ICD-10-CM

## 2013-10-01 DIAGNOSIS — E1149 Type 2 diabetes mellitus with other diabetic neurological complication: Secondary | ICD-10-CM

## 2013-10-01 NOTE — Progress Notes (Signed)
   Subjective:    Patient ID: Pauline Aus., male    DOB: 06/27/1931, 80 y.o.   MRN: [id number]  HPI  This patient presents assess the visit of 08/20/2013 the 300 mg gabapentin at night has reduced her painful burning sensation considerably and allows her to sleep much better. He denies any complaints from the gabapentin He was evaluated with Dr. Gwenlyn Found for peripheral arterial disease. He is also complaining of painful toenails and request debridement today and   Review of Systems  Respiratory: Positive for shortness of breath and wheezing.        Objective:   Physical Exam  Orientated x3  The toenails are elongated, hypertrophic, incurvated and tender to palpation  Vascular evaluation: Arterial Doppler 08/27/2013 was abnormal and was evaluated by Dr. Quay Burow  Neurological deferred      Assessment & Plan:   Assessment: Symptomatic onychomycoses 6-10 Peripheral arterial disease under management by Dr. Quay Burow Diabetic peripheral neuropathy  Plan: Debridement toenails x10 without bleeding Maintain gabapentin 300 mg at bedtime Okay to refill x11 months  Reappoint x3 months

## 2013-10-01 NOTE — Patient Instructions (Signed)
Continue gabapentin at night ?

## 2013-10-02 ENCOUNTER — Encounter: Payer: Self-pay | Admitting: Podiatry

## 2013-11-13 ENCOUNTER — Other Ambulatory Visit: Payer: Self-pay | Admitting: Podiatry

## 2013-11-14 ENCOUNTER — Telehealth: Payer: Self-pay | Admitting: *Deleted

## 2013-11-14 NOTE — Telephone Encounter (Signed)
"  Pharmacy at [facility] sent over a refill on his generic Neurontin yesterday.  They haven't received a response yet.  I was wondering if there's a possibility to get that back to them so we could pick that up today.  I'm actually here.  I was thinking that it would be ready by now.  They said they haven't heard from you yet.  So if you wouldn't mind that would be great.  [facility] on [address], if you don't have the fax, you can just call it in.    Prescription was okayed, sent via surescripts.  I called and informed the patient.  "Thank you, I'll pick it up first thing in the morning."

## 2013-11-17 DIAGNOSIS — E1142 Type 2 diabetes mellitus with diabetic polyneuropathy: Secondary | ICD-10-CM | POA: Diagnosis not present

## 2013-11-17 DIAGNOSIS — E039 Hypothyroidism, unspecified: Secondary | ICD-10-CM | POA: Diagnosis not present

## 2013-11-17 DIAGNOSIS — Z23 Encounter for immunization: Secondary | ICD-10-CM | POA: Diagnosis not present

## 2013-11-17 DIAGNOSIS — E063 Autoimmune thyroiditis: Secondary | ICD-10-CM | POA: Diagnosis not present

## 2013-12-24 DIAGNOSIS — I10 Essential (primary) hypertension: Secondary | ICD-10-CM | POA: Diagnosis not present

## 2013-12-24 DIAGNOSIS — E1140 Type 2 diabetes mellitus with diabetic neuropathy, unspecified: Secondary | ICD-10-CM | POA: Diagnosis not present

## 2013-12-24 DIAGNOSIS — E039 Hypothyroidism, unspecified: Secondary | ICD-10-CM | POA: Diagnosis not present

## 2013-12-24 DIAGNOSIS — J45909 Unspecified asthma, uncomplicated: Secondary | ICD-10-CM | POA: Diagnosis not present

## 2013-12-24 DIAGNOSIS — E785 Hyperlipidemia, unspecified: Secondary | ICD-10-CM | POA: Diagnosis not present

## 2013-12-29 ENCOUNTER — Ambulatory Visit: Payer: Medicare Other | Admitting: Podiatry

## 2014-01-14 ENCOUNTER — Encounter: Payer: Self-pay | Admitting: Podiatry

## 2014-01-14 ENCOUNTER — Ambulatory Visit (INDEPENDENT_AMBULATORY_CARE_PROVIDER_SITE_OTHER): Payer: Medicare Other | Admitting: Podiatry

## 2014-01-14 VITALS — BP 118/74 | HR 96 | Resp 12

## 2014-01-14 DIAGNOSIS — B351 Tinea unguium: Secondary | ICD-10-CM

## 2014-01-14 DIAGNOSIS — M79676 Pain in unspecified toe(s): Secondary | ICD-10-CM | POA: Diagnosis not present

## 2014-01-14 NOTE — Progress Notes (Signed)
Patient ID: Ernest Clark., male   DOB: 06-Feb-1931, 81 y.o.   MRN: [id number]  Subjective: As patient presents complaining of painful toenails and requests debridement  Objective: The toenails are hypertrophic elongated, incurvated, discolored and tender palpation 6-10  Assessment: Diabetic with a history of peripheral arterial disease evaluated by Dr. Quay Burow Diabetic peripheral neuropathy Symptomatic onychomycoses 6-10  Plan: Debrided toenails 10 without a bleeding  Reappoint 3 months

## 2014-01-14 NOTE — Patient Instructions (Signed)
Diabetes and Foot Care Diabetes may cause you to have problems because of poor blood supply (circulation) to your feet and legs. This may cause the skin on your feet to become thinner, break easier, and heal more slowly. Your skin may become dry, and the skin may peel and crack. You may also have nerve damage in your legs and feet causing decreased feeling in them. You may not notice minor injuries to your feet that could lead to infections or more serious problems. Taking care of your feet is one of the most important things you can do for yourself.  HOME CARE INSTRUCTIONS  Wear shoes at all times, even in the house. Do not go barefoot. Bare feet are easily injured.  Check your feet daily for blisters, cuts, and redness. If you cannot see the bottom of your feet, use a mirror or ask someone for help.  Wash your feet with warm water (do not use hot water) and mild soap. Then pat your feet and the areas between your toes until they are completely dry. Do not soak your feet as this can dry your skin.  Apply a moisturizing lotion or petroleum jelly (that does not contain alcohol and is unscented) to the skin on your feet and to dry, brittle toenails. Do not apply lotion between your toes.  Trim your toenails straight across. Do not dig under them or around the cuticle. File the edges of your nails with an emery board or nail file.  Do not cut corns or calluses or try to remove them with medicine.  Wear clean socks or stockings every day. Make sure they are not too tight. Do not wear knee-high stockings since they may decrease blood flow to your legs.  Wear shoes that fit properly and have enough cushioning. To break in new shoes, wear them for just a few hours a day. This prevents you from injuring your feet. Always look in your shoes before you put them on to be sure there are no objects inside.  Do not cross your legs. This may decrease the blood flow to your feet.  If you find a minor scrape,  cut, or break in the skin on your feet, keep it and the skin around it clean and dry. These areas may be cleansed with mild soap and water. Do not cleanse the area with peroxide, alcohol, or iodine.  When you remove an adhesive bandage, be sure not to damage the skin around it.  If you have a wound, look at it several times a day to make sure it is healing.  Do not use heating pads or hot water bottles. They may burn your skin. If you have lost feeling in your feet or legs, you may not know it is happening until it is too late.  Make sure your health care provider performs a complete foot exam at least annually or more often if you have foot problems. Report any cuts, sores, or bruises to your health care provider immediately. SEEK MEDICAL CARE IF:   You have an injury that is not healing.  You have cuts or breaks in the skin.  You have an ingrown nail.  You notice redness on your legs or feet.  You feel burning or tingling in your legs or feet.  You have pain or cramps in your legs and feet.  Your legs or feet are numb.  Your feet always feel cold. SEEK IMMEDIATE MEDICAL CARE IF:   There is increasing redness,   swelling, or pain in or around a wound.  There is a red line that goes up your leg.  Pus is coming from a wound.  You develop a fever or as directed by your health care provider.  You notice a bad smell coming from an ulcer or wound. Document Released: 12/17/1999 Document Revised: 08/21/2012 Document Reviewed: 05/28/2012 [employer] Patient Information 2015 [employer]. This information is not intended to replace advice given to you by your health care provider. Make sure you discuss any questions you have with your health care provider.  

## 2014-04-22 ENCOUNTER — Encounter: Payer: Self-pay | Admitting: Podiatry

## 2014-04-22 ENCOUNTER — Ambulatory Visit (INDEPENDENT_AMBULATORY_CARE_PROVIDER_SITE_OTHER): Payer: Medicare Other | Admitting: Podiatry

## 2014-04-22 DIAGNOSIS — B351 Tinea unguium: Secondary | ICD-10-CM

## 2014-04-22 DIAGNOSIS — M79676 Pain in unspecified toe(s): Secondary | ICD-10-CM

## 2014-04-22 NOTE — Progress Notes (Signed)
Patient ID: Ernest Clark., male   DOB: [birth year], 81 y.o.   MRN: [id number]  Subjective: This patient presents complaining of painful toenails and requests toenail debridement  Objective: The toenails are elongated, hypertrophic, incurvated, discolored and tender to direct palpation 6-10  Assessment: Symptomatic onychomycoses 6-10 Diabetic peripheral neuropathy Diabetic with a history of peripheral arterial disease evaluated by Dr. Quay Burow  Plan: Debridement of toenails 10 without any bleeding  Reappoint 3 months

## 2014-04-22 NOTE — Patient Instructions (Signed)
Diabetes and Foot Care Diabetes may cause you to have problems because of poor blood supply (circulation) to your feet and legs. This may cause the skin on your feet to become thinner, break easier, and heal more slowly. Your skin may become dry, and the skin may peel and crack. You may also have nerve damage in your legs and feet causing decreased feeling in them. You may not notice minor injuries to your feet that could lead to infections or more serious problems. Taking care of your feet is one of the most important things you can do for yourself.  HOME CARE INSTRUCTIONS  Wear shoes at all times, even in the house. Do not go barefoot. Bare feet are easily injured.  Check your feet daily for blisters, cuts, and redness. If you cannot see the bottom of your feet, use a mirror or ask someone for help.  Wash your feet with warm water (do not use hot water) and mild soap. Then pat your feet and the areas between your toes until they are completely dry. Do not soak your feet as this can dry your skin.  Apply a moisturizing lotion or petroleum jelly (that does not contain alcohol and is unscented) to the skin on your feet and to dry, brittle toenails. Do not apply lotion between your toes.  Trim your toenails straight across. Do not dig under them or around the cuticle. File the edges of your nails with an emery board or nail file.  Do not cut corns or calluses or try to remove them with medicine.  Wear clean socks or stockings every day. Make sure they are not too tight. Do not wear knee-high stockings since they may decrease blood flow to your legs.  Wear shoes that fit properly and have enough cushioning. To break in new shoes, wear them for just a few hours a day. This prevents you from injuring your feet. Always look in your shoes before you put them on to be sure there are no objects inside.  Do not cross your legs. This may decrease the blood flow to your feet.  If you find a minor scrape,  cut, or break in the skin on your feet, keep it and the skin around it clean and dry. These areas may be cleansed with mild soap and water. Do not cleanse the area with peroxide, alcohol, or iodine.  When you remove an adhesive bandage, be sure not to damage the skin around it.  If you have a wound, look at it several times a day to make sure it is healing.  Do not use heating pads or hot water bottles. They may burn your skin. If you have lost feeling in your feet or legs, you may not know it is happening until it is too late.  Make sure your health care provider performs a complete foot exam at least annually or more often if you have foot problems. Report any cuts, sores, or bruises to your health care provider immediately. SEEK MEDICAL CARE IF:   You have an injury that is not healing.  You have cuts or breaks in the skin.  You have an ingrown nail.  You notice redness on your legs or feet.  You feel burning or tingling in your legs or feet.  You have pain or cramps in your legs and feet.  Your legs or feet are numb.  Your feet always feel cold. SEEK IMMEDIATE MEDICAL CARE IF:   There is increasing redness,   swelling, or pain in or around a wound.  There is a red line that goes up your leg.  Pus is coming from a wound.  You develop a fever or as directed by your health care provider.  You notice a bad smell coming from an ulcer or wound. Document Released: 12/17/1999 Document Revised: 08/21/2012 Document Reviewed: 05/28/2012 [employer] Patient Information 2015 [employer]. This information is not intended to replace advice given to you by your health care provider. Make sure you discuss any questions you have with your health care provider.  

## 2014-07-07 DIAGNOSIS — S93491A Sprain of other ligament of right ankle, initial encounter: Secondary | ICD-10-CM | POA: Diagnosis not present

## 2014-07-29 ENCOUNTER — Ambulatory Visit (INDEPENDENT_AMBULATORY_CARE_PROVIDER_SITE_OTHER): Payer: Medicare Other | Admitting: Podiatry

## 2014-07-29 DIAGNOSIS — B351 Tinea unguium: Secondary | ICD-10-CM

## 2014-07-29 DIAGNOSIS — M79673 Pain in unspecified foot: Secondary | ICD-10-CM | POA: Diagnosis not present

## 2014-07-29 DIAGNOSIS — E1142 Type 2 diabetes mellitus with diabetic polyneuropathy: Secondary | ICD-10-CM | POA: Diagnosis not present

## 2014-07-29 DIAGNOSIS — I739 Peripheral vascular disease, unspecified: Secondary | ICD-10-CM

## 2014-07-29 NOTE — Patient Instructions (Signed)
As you have informed us the right foot episode of gout which was evaluated by [facility] orthopedic and you have a pending follow-up with your medical doctor  Diabetes and Foot Care Diabetes may cause you to have problems because of poor blood supply (circulation) to your feet and legs. This may cause the skin on your feet to become thinner, break easier, and heal more slowly. Your skin may become dry, and the skin may peel and crack. You may also have nerve damage in your legs and feet causing decreased feeling in them. You may not notice minor injuries to your feet that could lead to infections or more serious problems. Taking care of your feet is one of the most important things you can do for yourself.  HOME CARE INSTRUCTIONS  Wear shoes at all times, even in the house. Do not go barefoot. Bare feet are easily injured.  Check your feet daily for blisters, cuts, and redness. If you cannot see the bottom of your feet, use a mirror or ask someone for help.  Wash your feet with warm water (do not use hot water) and mild soap. Then pat your feet and the areas between your toes until they are completely dry. Do not soak your feet as this can dry your skin.  Apply a moisturizing lotion or petroleum jelly (that does not contain alcohol and is unscented) to the skin on your feet and to dry, brittle toenails. Do not apply lotion between your toes.  Trim your toenails straight across. Do not dig under them or around the cuticle. File the edges of your nails with an emery board or nail file.  Do not cut corns or calluses or try to remove them with medicine.  Wear clean socks or stockings every day. Make sure they are not too tight. Do not wear knee-high stockings since they may decrease blood flow to your legs.  Wear shoes that fit properly and have enough cushioning. To break in new shoes, wear them for just a few hours a day. This prevents you from injuring your feet. Always look in your shoes before you  put them on to be sure there are no objects inside.  Do not cross your legs. This may decrease the blood flow to your feet.  If you find a minor scrape, cut, or break in the skin on your feet, keep it and the skin around it clean and dry. These areas may be cleansed with mild soap and water. Do not cleanse the area with peroxide, alcohol, or iodine.  When you remove an adhesive bandage, be sure not to damage the skin around it.  If you have a wound, look at it several times a day to make sure it is healing.  Do not use heating pads or hot water bottles. They may burn your skin. If you have lost feeling in your feet or legs, you may not know it is happening until it is too late.  Make sure your health care provider performs a complete foot exam at least annually or more often if you have foot problems. Report any cuts, sores, or bruises to your health care provider immediately. SEEK MEDICAL CARE IF:   You have an injury that is not healing.  You have cuts or breaks in the skin.  You have an ingrown nail.  You notice redness on your legs or feet.  You feel burning or tingling in your legs or feet.  You have pain or cramps in  your legs and feet.  Your legs or feet are numb.  Your feet always feel cold. SEEK IMMEDIATE MEDICAL CARE IF:   There is increasing redness, swelling, or pain in or around a wound.  There is a red line that goes up your leg.  Pus is coming from a wound.  You develop a fever or as directed by your health care provider.  You notice a bad smell coming from an ulcer or wound. Document Released: 12/17/1999 Document Revised: 08/21/2012 Document Reviewed: 05/28/2012 [facility] Patient Information 2015 [place], [place]. This information is not intended to replace advice given to you by your health care provider. Make sure you discuss any questions you have with your health care provider.

## 2014-07-30 NOTE — Progress Notes (Signed)
Patient ID: Ernest Clark., male   DOB: 1931-03-30, 81 y.o.   MRN: [id number]  Subjective: This patient presents for a scheduled visit for debridement of mycotic toenails. He also states that he's had a possible gout episode in the right foot there is been evaluated and diagnosed by orthopedic doctor on 07/17/2014 with follow-up with his internist.  Objective: Right foot demonstrates no open lesions. There is local erythema edema noted The toenails are elongated, hypertrophic, incurvated and tender to direct palpation 6-10  Assessment: Diabetic with a history of neuropathy and peripheral arterial disease. Peripheral arterial disease is been evaluated by Dr. Quay Burow The swelling has been evaluated by with the Dr. and has follow-up with internist Mycotic toenails 6-10  Plan: Debridement of toenails 10 without any bleeding  Reappoint 3 months

## 2014-08-06 DIAGNOSIS — Z794 Long term (current) use of insulin: Secondary | ICD-10-CM | POA: Diagnosis not present

## 2014-08-06 DIAGNOSIS — E1142 Type 2 diabetes mellitus with diabetic polyneuropathy: Secondary | ICD-10-CM | POA: Diagnosis not present

## 2014-08-06 DIAGNOSIS — E039 Hypothyroidism, unspecified: Secondary | ICD-10-CM | POA: Diagnosis not present

## 2014-08-06 DIAGNOSIS — M109 Gout, unspecified: Secondary | ICD-10-CM | POA: Diagnosis not present

## 2014-08-06 DIAGNOSIS — E063 Autoimmune thyroiditis: Secondary | ICD-10-CM | POA: Diagnosis not present

## 2014-08-11 DIAGNOSIS — L03115 Cellulitis of right lower limb: Secondary | ICD-10-CM | POA: Diagnosis not present

## 2014-08-14 DIAGNOSIS — L03119 Cellulitis of unspecified part of limb: Secondary | ICD-10-CM | POA: Diagnosis not present

## 2014-08-14 DIAGNOSIS — M10071 Idiopathic gout, right ankle and foot: Secondary | ICD-10-CM | POA: Diagnosis not present

## 2014-08-17 DIAGNOSIS — L03115 Cellulitis of right lower limb: Secondary | ICD-10-CM | POA: Diagnosis not present

## 2014-08-17 DIAGNOSIS — M109 Gout, unspecified: Secondary | ICD-10-CM | POA: Diagnosis not present

## 2014-08-19 DIAGNOSIS — L03115 Cellulitis of right lower limb: Secondary | ICD-10-CM | POA: Diagnosis not present

## 2014-08-26 DIAGNOSIS — M7989 Other specified soft tissue disorders: Secondary | ICD-10-CM | POA: Diagnosis not present

## 2014-08-27 DIAGNOSIS — I70201 Unspecified atherosclerosis of native arteries of extremities, right leg: Secondary | ICD-10-CM | POA: Diagnosis not present

## 2014-08-27 DIAGNOSIS — R600 Localized edema: Secondary | ICD-10-CM | POA: Diagnosis not present

## 2014-09-04 DIAGNOSIS — I8310 Varicose veins of unspecified lower extremity with inflammation: Secondary | ICD-10-CM | POA: Diagnosis not present

## 2014-09-04 DIAGNOSIS — R609 Edema, unspecified: Secondary | ICD-10-CM | POA: Diagnosis not present

## 2014-09-04 DIAGNOSIS — I739 Peripheral vascular disease, unspecified: Secondary | ICD-10-CM | POA: Diagnosis not present

## 2014-09-04 DIAGNOSIS — Z79899 Other long term (current) drug therapy: Secondary | ICD-10-CM | POA: Diagnosis not present

## 2014-09-04 DIAGNOSIS — E1140 Type 2 diabetes mellitus with diabetic neuropathy, unspecified: Secondary | ICD-10-CM | POA: Diagnosis not present

## 2014-09-14 DIAGNOSIS — R600 Localized edema: Secondary | ICD-10-CM | POA: Diagnosis not present

## 2014-09-14 DIAGNOSIS — E1142 Type 2 diabetes mellitus with diabetic polyneuropathy: Secondary | ICD-10-CM | POA: Diagnosis not present

## 2014-09-14 DIAGNOSIS — Z794 Long term (current) use of insulin: Secondary | ICD-10-CM | POA: Diagnosis not present

## 2014-09-14 DIAGNOSIS — E063 Autoimmune thyroiditis: Secondary | ICD-10-CM | POA: Diagnosis not present

## 2014-09-14 DIAGNOSIS — E039 Hypothyroidism, unspecified: Secondary | ICD-10-CM | POA: Diagnosis not present

## 2014-09-21 DIAGNOSIS — I8312 Varicose veins of left lower extremity with inflammation: Secondary | ICD-10-CM | POA: Diagnosis not present

## 2014-09-21 DIAGNOSIS — I8311 Varicose veins of right lower extremity with inflammation: Secondary | ICD-10-CM | POA: Diagnosis not present

## 2014-09-21 DIAGNOSIS — I872 Venous insufficiency (chronic) (peripheral): Secondary | ICD-10-CM | POA: Diagnosis not present

## 2014-09-21 DIAGNOSIS — L821 Other seborrheic keratosis: Secondary | ICD-10-CM | POA: Diagnosis not present

## 2014-10-22 DIAGNOSIS — Z23 Encounter for immunization: Secondary | ICD-10-CM | POA: Diagnosis not present

## 2014-11-04 ENCOUNTER — Encounter: Payer: Self-pay | Admitting: Podiatry

## 2014-11-04 ENCOUNTER — Ambulatory Visit (INDEPENDENT_AMBULATORY_CARE_PROVIDER_SITE_OTHER): Payer: Medicare Other | Admitting: Podiatry

## 2014-11-04 DIAGNOSIS — E1142 Type 2 diabetes mellitus with diabetic polyneuropathy: Secondary | ICD-10-CM | POA: Diagnosis not present

## 2014-11-04 DIAGNOSIS — I739 Peripheral vascular disease, unspecified: Secondary | ICD-10-CM

## 2014-11-04 DIAGNOSIS — B351 Tinea unguium: Secondary | ICD-10-CM | POA: Diagnosis not present

## 2014-11-04 DIAGNOSIS — M79673 Pain in unspecified foot: Secondary | ICD-10-CM

## 2014-11-04 NOTE — Progress Notes (Signed)
Patient ID: Ernest Barna., male   DOB: 1931-04-26, 81 y.o.   MRN: [id number]    Subjective:   this patient presents for scheduled visit complaining of painful toenails when walking wearing shoes and requests toenail debridement. Patient also states that in the past 3 weeks he feel causing his right foot and leg to become swollen including a second right toe which is still swollen and pain-free   Objective:  edematous second right toe without any open lesions   hammertoe second right toe  the toenails are elongated, hypertrophic, deformed and tender to direct palpation 6-10   Assessment:  diabetic with a history of neuropathy and peripheral arterial disease   peripheral 2 disease is been evaluated by Dr. Gwenlyn Found   second right toe  Is edematous   with correct alignment other than hammertoe deformity , however, no open lesions   symptomatic onychomycosis 6-10   plan:  debridement toenails 10 mechanically and electrically without any bleeding   advised patient to wear a soft shoe until all swelling in second right toe resolves  Reappoint 3 months

## 2014-11-04 NOTE — Patient Instructions (Signed)
Diabetes and Foot Care Diabetes may cause you to have problems because of poor blood supply (circulation) to your feet and legs. This may cause the skin on your feet to become thinner, break easier, and heal more slowly. Your skin may become dry, and the skin may peel and crack. You may also have nerve damage in your legs and feet causing decreased feeling in them. You may not notice minor injuries to your feet that could lead to infections or more serious problems. Taking care of your feet is one of the most important things you can do for yourself.  HOME CARE INSTRUCTIONS  Wear shoes at all times, even in the house. Do not go barefoot. Bare feet are easily injured.  Check your feet daily for blisters, cuts, and redness. If you cannot see the bottom of your feet, use a mirror or ask someone for help.  Wash your feet with warm water (do not use hot water) and mild soap. Then pat your feet and the areas between your toes until they are completely dry. Do not soak your feet as this can dry your skin.  Apply a moisturizing lotion or petroleum jelly (that does not contain alcohol and is unscented) to the skin on your feet and to dry, brittle toenails. Do not apply lotion between your toes.  Trim your toenails straight across. Do not dig under them or around the cuticle. File the edges of your nails with an emery board or nail file.  Do not cut corns or calluses or try to remove them with medicine.  Wear clean socks or stockings every day. Make sure they are not too tight. Do not wear knee-high stockings since they may decrease blood flow to your legs.  Wear shoes that fit properly and have enough cushioning. To break in new shoes, wear them for just a few hours a day. This prevents you from injuring your feet. Always look in your shoes before you put them on to be sure there are no objects inside.  Do not cross your legs. This may decrease the blood flow to your feet.  If you find a minor scrape,  cut, or break in the skin on your feet, keep it and the skin around it clean and dry. These areas may be cleansed with mild soap and water. Do not cleanse the area with peroxide, alcohol, or iodine.  When you remove an adhesive bandage, be sure not to damage the skin around it.  If you have a wound, look at it several times a day to make sure it is healing.  Do not use heating pads or hot water bottles. They may burn your skin. If you have lost feeling in your feet or legs, you may not know it is happening until it is too late.  Make sure your health care provider performs a complete foot exam at least annually or more often if you have foot problems. Report any cuts, sores, or bruises to your health care provider immediately. SEEK MEDICAL CARE IF:   You have an injury that is not healing.  You have cuts or breaks in the skin.  You have an ingrown nail.  You notice redness on your legs or feet.  You feel burning or tingling in your legs or feet.  You have pain or cramps in your legs and feet.  Your legs or feet are numb.  Your feet always feel cold. SEEK IMMEDIATE MEDICAL CARE IF:   There is increasing redness,   swelling, or pain in or around a wound.  There is a red line that goes up your leg.  Pus is coming from a wound.  You develop a fever or as directed by your health care provider.  You notice a bad smell coming from an ulcer or wound.   This information is not intended to replace advice given to you by your health care provider. Make sure you discuss any questions you have with your health care provider.   Document Released: 12/17/1999 Document Revised: 08/21/2012 Document Reviewed: 05/28/2012 Elsevier Interactive Patient Education 2016 [employer]  

## 2014-12-16 DIAGNOSIS — Z794 Long term (current) use of insulin: Secondary | ICD-10-CM | POA: Diagnosis not present

## 2014-12-16 DIAGNOSIS — E063 Autoimmune thyroiditis: Secondary | ICD-10-CM | POA: Diagnosis not present

## 2014-12-16 DIAGNOSIS — Z7984 Long term (current) use of oral hypoglycemic drugs: Secondary | ICD-10-CM | POA: Diagnosis not present

## 2014-12-16 DIAGNOSIS — R600 Localized edema: Secondary | ICD-10-CM | POA: Diagnosis not present

## 2014-12-16 DIAGNOSIS — E039 Hypothyroidism, unspecified: Secondary | ICD-10-CM | POA: Diagnosis not present

## 2014-12-16 DIAGNOSIS — E1142 Type 2 diabetes mellitus with diabetic polyneuropathy: Secondary | ICD-10-CM | POA: Diagnosis not present

## 2015-02-11 DIAGNOSIS — E1142 Type 2 diabetes mellitus with diabetic polyneuropathy: Secondary | ICD-10-CM | POA: Diagnosis not present

## 2015-02-11 DIAGNOSIS — E039 Hypothyroidism, unspecified: Secondary | ICD-10-CM | POA: Diagnosis not present

## 2015-02-11 DIAGNOSIS — Z794 Long term (current) use of insulin: Secondary | ICD-10-CM | POA: Diagnosis not present

## 2015-02-11 DIAGNOSIS — E063 Autoimmune thyroiditis: Secondary | ICD-10-CM | POA: Diagnosis not present

## 2015-02-17 ENCOUNTER — Ambulatory Visit (INDEPENDENT_AMBULATORY_CARE_PROVIDER_SITE_OTHER): Payer: Medicare Other | Admitting: Podiatry

## 2015-02-17 ENCOUNTER — Encounter: Payer: Self-pay | Admitting: Podiatry

## 2015-02-17 DIAGNOSIS — M79674 Pain in right toe(s): Secondary | ICD-10-CM

## 2015-02-17 DIAGNOSIS — M79675 Pain in left toe(s): Secondary | ICD-10-CM

## 2015-02-17 DIAGNOSIS — B351 Tinea unguium: Secondary | ICD-10-CM | POA: Diagnosis not present

## 2015-02-17 NOTE — Patient Instructions (Signed)
Diabetes and Foot Care Diabetes may cause you to have problems because of poor blood supply (circulation) to your feet and legs. This may cause the skin on your feet to become thinner, break easier, and heal more slowly. Your skin may become dry, and the skin may peel and crack. You may also have nerve damage in your legs and feet causing decreased feeling in them. You may not notice minor injuries to your feet that could lead to infections or more serious problems. Taking care of your feet is one of the most important things you can do for yourself.  HOME CARE INSTRUCTIONS  Wear shoes at all times, even in the house. Do not go barefoot. Bare feet are easily injured.  Check your feet daily for blisters, cuts, and redness. If you cannot see the bottom of your feet, use a mirror or ask someone for help.  Wash your feet with warm water (do not use hot water) and mild soap. Then pat your feet and the areas between your toes until they are completely dry. Do not soak your feet as this can dry your skin.  Apply a moisturizing lotion or petroleum jelly (that does not contain alcohol and is unscented) to the skin on your feet and to dry, brittle toenails. Do not apply lotion between your toes.  Trim your toenails straight across. Do not dig under them or around the cuticle. File the edges of your nails with an emery board or nail file.  Do not cut corns or calluses or try to remove them with medicine.  Wear clean socks or stockings every day. Make sure they are not too tight. Do not wear knee-high stockings since they may decrease blood flow to your legs.  Wear shoes that fit properly and have enough cushioning. To break in new shoes, wear them for just a few hours a day. This prevents you from injuring your feet. Always look in your shoes before you put them on to be sure there are no objects inside.  Do not cross your legs. This may decrease the blood flow to your feet.  If you find a minor scrape,  cut, or break in the skin on your feet, keep it and the skin around it clean and dry. These areas may be cleansed with mild soap and water. Do not cleanse the area with peroxide, alcohol, or iodine.  When you remove an adhesive bandage, be sure not to damage the skin around it.  If you have a wound, look at it several times a day to make sure it is healing.  Do not use heating pads or hot water bottles. They may burn your skin. If you have lost feeling in your feet or legs, you may not know it is happening until it is too late.  Make sure your health care provider performs a complete foot exam at least annually or more often if you have foot problems. Report any cuts, sores, or bruises to your health care provider immediately. SEEK MEDICAL CARE IF:   You have an injury that is not healing.  You have cuts or breaks in the skin.  You have an ingrown nail.  You notice redness on your legs or feet.  You feel burning or tingling in your legs or feet.  You have pain or cramps in your legs and feet.  Your legs or feet are numb.  Your feet always feel cold. SEEK IMMEDIATE MEDICAL CARE IF:   There is increasing redness,   swelling, or pain in or around a wound.  There is a red line that goes up your leg.  Pus is coming from a wound.  You develop a fever or as directed by your health care provider.  You notice a bad smell coming from an ulcer or wound.   This information is not intended to replace advice given to you by your health care provider. Make sure you discuss any questions you have with your health care provider.   Document Released: 12/17/1999 Document Revised: 08/21/2012 Document Reviewed: 05/28/2012 Elsevier Interactive Patient Education 2016 [employer]  

## 2015-02-17 NOTE — Progress Notes (Signed)
Patient ID: Ernest Clark., male   DOB: 07-09-1931, 82 y.o.   MRN: [id number]  Subjective: This patient presents again for schedule visit complaining of toenails that are uncomfortable walking wearing shoes and requesting nail debridement.  Objective: Orientated 3 No open skin lesions bilaterally The toenails are elongated, brittle, deformed, discolored and tender to direct palpation 6-10  Assessment: Diabetic with a history neuropathy and peripheral arterial disease Symptomatic onychomycoses 6-10  Plan: Debrided toenails 6-10 mechanically an electrical without any bleeding  Reappoint 3 months

## 2015-03-06 ENCOUNTER — Encounter (HOSPITAL_COMMUNITY): Payer: Self-pay

## 2015-03-06 ENCOUNTER — Emergency Department (HOSPITAL_COMMUNITY)
Admission: EM | Admit: 2015-03-06 | Discharge: 2015-03-07 | Disposition: A | Discharge status: Home / Self Care | Payer: Medicare Other | Attending: Emergency Medicine | Admitting: Emergency Medicine

## 2015-03-06 ENCOUNTER — Emergency Department (HOSPITAL_COMMUNITY): Payer: Medicare Other

## 2015-03-06 DIAGNOSIS — J449 Chronic obstructive pulmonary disease, unspecified: Secondary | ICD-10-CM | POA: Diagnosis not present

## 2015-03-06 DIAGNOSIS — S42292A Other displaced fracture of upper end of left humerus, initial encounter for closed fracture: Secondary | ICD-10-CM | POA: Insufficient documentation

## 2015-03-06 DIAGNOSIS — Y92511 Restaurant or cafe as the place of occurrence of the external cause: Secondary | ICD-10-CM | POA: Diagnosis not present

## 2015-03-06 DIAGNOSIS — Z7984 Long term (current) use of oral hypoglycemic drugs: Secondary | ICD-10-CM | POA: Insufficient documentation

## 2015-03-06 DIAGNOSIS — Z23 Encounter for immunization: Secondary | ICD-10-CM | POA: Diagnosis not present

## 2015-03-06 DIAGNOSIS — I2510 Atherosclerotic heart disease of native coronary artery without angina pectoris: Secondary | ICD-10-CM | POA: Diagnosis not present

## 2015-03-06 DIAGNOSIS — S42252A Displaced fracture of greater tuberosity of left humerus, initial encounter for closed fracture: Secondary | ICD-10-CM | POA: Diagnosis not present

## 2015-03-06 DIAGNOSIS — S42212A Unspecified displaced fracture of surgical neck of left humerus, initial encounter for closed fracture: Secondary | ICD-10-CM

## 2015-03-06 DIAGNOSIS — S0081XA Abrasion of other part of head, initial encounter: Secondary | ICD-10-CM | POA: Insufficient documentation

## 2015-03-06 DIAGNOSIS — I10 Essential (primary) hypertension: Secondary | ICD-10-CM | POA: Diagnosis not present

## 2015-03-06 DIAGNOSIS — Z7982 Long term (current) use of aspirin: Secondary | ICD-10-CM | POA: Insufficient documentation

## 2015-03-06 DIAGNOSIS — S0990XA Unspecified injury of head, initial encounter: Secondary | ICD-10-CM | POA: Insufficient documentation

## 2015-03-06 DIAGNOSIS — E785 Hyperlipidemia, unspecified: Secondary | ICD-10-CM | POA: Insufficient documentation

## 2015-03-06 DIAGNOSIS — Z86718 Personal history of other venous thrombosis and embolism: Secondary | ICD-10-CM | POA: Insufficient documentation

## 2015-03-06 DIAGNOSIS — Z87891 Personal history of nicotine dependence: Secondary | ICD-10-CM | POA: Diagnosis not present

## 2015-03-06 DIAGNOSIS — S199XXA Unspecified injury of neck, initial encounter: Secondary | ICD-10-CM | POA: Diagnosis not present

## 2015-03-06 DIAGNOSIS — Y9301 Activity, walking, marching and hiking: Secondary | ICD-10-CM | POA: Insufficient documentation

## 2015-03-06 DIAGNOSIS — Z79899 Other long term (current) drug therapy: Secondary | ICD-10-CM | POA: Insufficient documentation

## 2015-03-06 DIAGNOSIS — E119 Type 2 diabetes mellitus without complications: Secondary | ICD-10-CM | POA: Insufficient documentation

## 2015-03-06 DIAGNOSIS — Z7952 Long term (current) use of systemic steroids: Secondary | ICD-10-CM | POA: Diagnosis not present

## 2015-03-06 DIAGNOSIS — M25512 Pain in left shoulder: Secondary | ICD-10-CM | POA: Diagnosis not present

## 2015-03-06 DIAGNOSIS — Y998 Other external cause status: Secondary | ICD-10-CM | POA: Diagnosis not present

## 2015-03-06 DIAGNOSIS — S4992XA Unspecified injury of left shoulder and upper arm, initial encounter: Secondary | ICD-10-CM | POA: Diagnosis present

## 2015-03-06 DIAGNOSIS — W19XXXA Unspecified fall, initial encounter: Secondary | ICD-10-CM

## 2015-03-06 DIAGNOSIS — W108XXA Fall (on) (from) other stairs and steps, initial encounter: Secondary | ICD-10-CM | POA: Insufficient documentation

## 2015-03-06 DIAGNOSIS — M109 Gout, unspecified: Secondary | ICD-10-CM | POA: Diagnosis not present

## 2015-03-06 DIAGNOSIS — S0031XA Abrasion of nose, initial encounter: Secondary | ICD-10-CM | POA: Diagnosis not present

## 2015-03-06 LAB — CBG MONITORING, ED: Glucose-Capillary: 150 mg/dL — ABNORMAL HIGH (ref 65–99)

## 2015-03-06 MED ORDER — TETANUS-DIPHTH-ACELL PERTUSSIS 5-2.5-18.5 LF-MCG/0.5 IM SUSP
0.5000 mL | Freq: Once | INTRAMUSCULAR | Status: AC
  Administered 2015-03-06: 0.5 mL via INTRAMUSCULAR
  Filled 2015-03-06: qty 0.5

## 2015-03-06 MED ORDER — HYDROCODONE-ACETAMINOPHEN 5-325 MG PO TABS
1.0000 | ORAL_TABLET | Freq: Once | ORAL | Status: AC
  Administered 2015-03-07: 1 via ORAL
  Filled 2015-03-06: qty 1

## 2015-03-06 NOTE — ED Notes (Signed)
Onset tonight pt was coming out of restaurant, stepped off step and fell forward onto gravel, abrasion to forehead and top of nose.  Pt c/o left shoulder pain, unable to move arm d/t pain.

## 2015-03-06 NOTE — ED Notes (Signed)
Patient transported to X-ray 

## 2015-03-06 NOTE — ED Provider Notes (Signed)
CSN: [id number]     Arrival date & time 03/06/15  2130 History   First MD Initiated Contact with Patient 03/06/15 2202     Chief Complaint  Patient presents with  . Fall  . Abrasion  . Shoulder Pain    Ernest Clark. is a 82 y.o. male who presents to the emergency department after a trip and fall while walking out of a restaurant tonight. The patient reports he was walking out of a restaurant in the dark when he misstepped and tripped. He fell hitting his forehead and his left shoulder. He is complaining of pain at his left shoulder and has pain with movement. He denies loss of consciousness. He denies headache or neck pain. He denies any numbness, tingling or weakness. He is not on any blood thinners. He reports chronic shortness of breath that is not worsened due to his COPD. He denies fevers, chest pain, palpitations, urinary symptoms, abdominal pain, nausea, vomiting, diarrhea, rashes, or syncope.   Patient is a 82 y.o. male presenting with fall and shoulder pain. The history is provided by the patient and a relative. No language interpreter was used.  Fall Associated symptoms include arthralgias. Pertinent negatives include no abdominal pain, chest pain, chills, congestion, coughing, fever, headaches, nausea, neck pain, numbness, rash, sore throat, vomiting or weakness.  Shoulder Pain Associated symptoms: no back pain, no fever and no neck pain     Past Medical History  Diagnosis Date  . Coronary heart disease     remote PCI  . Hypertension   . Hyperlipidemia   . Diabetes mellitus   . DVT (deep venous thrombosis) ([place]) 2011  . Gout   . COPD (chronic obstructive pulmonary disease) ([place])   . Peripheral arterial disease [facility])    Past Surgical History  Procedure Laterality Date  . Tonsillectomy  as a child  . Cardiac catheterization  1994    w/ PTCA   . Colonoscopy      w/ polyp resection   Family History  Problem Relation Age of Onset  . Diabetes Father   . Diabetes  Mother   . Diabetes Brother   . Diabetes Brother    Social History  Substance Use Topics  . Smoking status: Former Smoker -- 2.00 packs/day for 60 years    Types: Cigarettes    Quit date: 01/02/2001  . Smokeless tobacco: Former Systems developer    Types: Chew  . Alcohol Use: No    Review of Systems  Constitutional: Negative for fever and chills.  HENT: Negative for congestion and sore throat.   Eyes: Negative for visual disturbance.  Respiratory: Negative for cough, shortness of breath and wheezing.   Cardiovascular: Negative for chest pain and palpitations.  Gastrointestinal: Negative for nausea, vomiting, abdominal pain and diarrhea.  Genitourinary: Negative for dysuria.  Musculoskeletal: Positive for arthralgias. Negative for back pain, neck pain and neck stiffness.  Skin: Positive for wound. Negative for rash.  Neurological: Negative for dizziness, syncope, weakness, light-headedness, numbness and headaches.      Allergies  Actos and Metformin and related  Home Medications   Prior to Admission medications   Medication Sig Start Date End Date Taking? Authorizing Provider  albuterol (PROAIR HFA) 108 (90 BASE) MCG/ACT inhaler Inhale 2 puffs into the lungs every 6 (six) hours as needed for shortness of breath.    Yes Historical Provider, MD  allopurinol (ZYLOPRIM) 100 MG tablet TK 1 T PO D 10/13/14  Yes Historical Provider, MD  aspirin 81  MG tablet Take 81 mg by mouth daily.    Yes Historical Provider, MD  glimepiride (AMARYL) 2 MG tablet Take 2 mg by mouth 2 (two) times daily.     Yes Historical Provider, MD  montelukast (SINGULAIR) 10 MG tablet Take 10 mg by mouth daily.     Yes Historical Provider, MD  olmesartan (BENICAR) 40 MG tablet One half daily 01/26/11  Yes Tanda Rockers, MD  rosuvastatin (CRESTOR) 40 MG tablet Take 40 mg by mouth daily.     Yes Historical Provider, MD  sitaGLIPtin (JANUVIA) 100 MG tablet Take 100 mg by mouth daily.   Yes Historical Provider, MD  Skin  Protectants, Misc. (EUCERIN) cream Apply 1 application topically as needed for dry skin.   Yes Historical Provider, MD  amoxicillin-clavulanate (AUGMENTIN) 875-125 MG tablet TK 1 T PO  Q 12 H 08/24/14   Historical Provider, MD  betamethasone dipropionate (DIPROLENE) 0.05 % cream Apply topically 2 (two) times daily.      Historical Provider, MD  cephALEXin (KEFLEX) 500 MG capsule TK ONE C PO  TID X 10 DAYS 08/11/14   Historical Provider, MD  ezetimibe (ZETIA) 10 MG tablet Take 10 mg by mouth daily.      Historical Provider, MD  FLUZONE HIGH-DOSE 0.5 ML SUSY ADM 0.5ML IM UTD 10/22/14   Historical Provider, MD  gabapentin (NEURONTIN) 300 MG capsule TAKE ONE CAPSULE ( 300 MG TOTAL) BY MOUTH AT BEDTIME 11/14/13   Gean Birchwood, DPM  HYDROcodone-acetaminophen (NORCO/VICODIN) 5-325 MG tablet Take 1 tablet by mouth every 4 (four) hours as needed for moderate pain or severe pain. 03/07/15   Waynetta Pean, PA-C  indomethacin (INDOCIN) 25 MG capsule Take 25 mg by mouth as needed.      Historical Provider, MD  Mometasone Furo-Formoterol Fum (DULERA) 200-5 MCG/ACT AERO Inhale 2 puffs into the lungs 2 (two) times daily. 02/23/11   Tanda Rockers, MD  sildenafil (VIAGRA) 100 MG tablet Take 100 mg by mouth daily as needed.      Historical Provider, MD  triamcinolone cream (KENALOG) 0.1 % APP EXT  TO LEGS ATN 09/21/14   Historical Provider, MD  triamterene-hydrochlorothiazide (DYAZIDE) 37.5-25 MG capsule TK ONE C PO QAM 08/27/14   Historical Provider, MD   BP 162/88 mmHg  Pulse 91  Temp(Src) 97.3 F (36.3 C) (Oral)  Resp 19  Ht 5\' 8"  (1.727 m)  Wt 108.636 kg  BMI 36.42 kg/m2  SpO2 95% Physical Exam  Constitutional: He is oriented to person, place, and time. He appears well-developed and well-nourished. No distress.  Nontoxic appearing.  HENT:  Head: Normocephalic.  Right Ear: External ear normal.  Left Ear: External ear normal.  Mouth/Throat: Oropharynx is clear and moist.  Small nonbleeding abrasion  noted to the patient's nose and anterior forehead. No other evidence of head injury.   Eyes: Conjunctivae and EOM are normal. Pupils are equal, round, and reactive to light. Right eye exhibits no discharge. Left eye exhibits no discharge.  Neck: Normal range of motion. Neck supple. No JVD present. No tracheal deviation present.  No midline neck tenderness.  Cardiovascular: Regular rhythm, normal heart sounds and intact distal pulses.  Exam reveals no gallop and no friction rub.   No murmur heard. Heart rate is 104. Bilateral radial pulses are intact. Good capillary refill to his left distal fingertips.  Pulmonary/Chest: Effort normal and breath sounds normal. No respiratory distress. He has no wheezes. He has no rales. He exhibits no tenderness.  Lungs are clear to auscultation bilaterally.  Abdominal: Soft. Bowel sounds are normal. There is no tenderness. There is no guarding.  Musculoskeletal: He exhibits tenderness. He exhibits no edema.  Patient has tenderness to his left humeral head. He has restricted range of motion due to pain. No clavicle tenderness. No tenderness over his scapula. No midline neck or back tenderness.  Lymphadenopathy:    He has no cervical adenopathy.  Neurological: He is alert and oriented to person, place, and time. No cranial nerve deficit. Coordination normal.  The patient is alert night 3. Cranial nerves are intact. Sensation is intact in his bilateral upper and lower extremities.  Skin: Skin is warm and dry. No rash noted. He is not diaphoretic. No erythema. No pallor.  Psychiatric: He has a normal mood and affect. His behavior is normal.  Nursing note and vitals reviewed.   ED Course  Procedures (including critical care time) Labs Review Labs Reviewed  CBG MONITORING, ED - Abnormal; Notable for the following:    Glucose-Capillary 150 (*)    All other components within normal limits    Imaging Review Ct Head Wo Contrast  03/07/2015  CLINICAL DATA:   Stepped off step and fell into gravel parking lot, with abrasion at the forehead. Concern for cervical spine injury. Initial encounter. EXAM: CT HEAD WITHOUT CONTRAST CT CERVICAL SPINE WITHOUT CONTRAST TECHNIQUE: Multidetector CT imaging of the head and cervical spine was performed following the standard protocol without intravenous contrast. Multiplanar CT image reconstructions of the cervical spine were also generated. COMPARISON:  None. FINDINGS: CT HEAD FINDINGS There is no evidence of acute infarction, mass lesion, or intra- or extra-axial hemorrhage on CT. Prominence of the ventricles and sulci reflects mild to moderate cortical volume loss. Scattered periventricular and subcortical white matter change likely reflects small vessel ischemic microangiopathy. The brainstem and fourth ventricle are within normal limits. The basal ganglia are unremarkable in appearance. The cerebral hemispheres demonstrate grossly normal gray-white differentiation. No mass effect or midline shift is seen. There is no evidence of fracture; visualized osseous structures are unremarkable in appearance. A 1.2 cm soft tissue density is suggested along the superior aspect of the central right orbit, with minimal calcification, of uncertain significance. The orbits are otherwise within normal limits. The paranasal sinuses and mastoid air cells are well-aerated. No significant soft tissue abnormalities are seen. CT CERVICAL SPINE FINDINGS There is no evidence of fracture or subluxation. Multilevel disc space narrowing is noted along the cervical spine, with scattered anterior and posterior osteophyte complexes, and mild underlying facet disease. Vertebral bodies demonstrate normal height and alignment. Prevertebral soft tissues are within normal limits. The thyroid gland is unremarkable in appearance. The visualized lung apices are clear. Scattered calcification is noted at the carotid bifurcations bilaterally, with likely mild luminal  narrowing. IMPRESSION: 1. No evidence of traumatic intracranial injury or fracture. 2. No evidence of fracture or subluxation along the cervical spine. 3. 1.2 cm soft tissue density suggested along the superior aspect of the central right orbit, with minimal calcification, of uncertain significance. If the patient has any right orbital symptoms, MRI of the orbits could be considered for further evaluation. 4. Mild to moderate cortical volume loss and scattered small vessel ischemic microangiopathy. 5. Mild diffuse degenerative change along the cervical spine. 6. Scattered calcification at the carotid bifurcations bilaterally, with likely mild luminal narrowing. Carotid ultrasound would be helpful for further evaluation, when and as deemed clinically appropriate. Electronically Signed   By: Francoise Schaumann.D.  On: 03/07/2015 00:49   Ct Cervical Spine Wo Contrast  03/07/2015  CLINICAL DATA:  Stepped off step and fell into gravel parking lot, with abrasion at the forehead. Concern for cervical spine injury. Initial encounter. EXAM: CT HEAD WITHOUT CONTRAST CT CERVICAL SPINE WITHOUT CONTRAST TECHNIQUE: Multidetector CT imaging of the head and cervical spine was performed following the standard protocol without intravenous contrast. Multiplanar CT image reconstructions of the cervical spine were also generated. COMPARISON:  None. FINDINGS: CT HEAD FINDINGS There is no evidence of acute infarction, mass lesion, or intra- or extra-axial hemorrhage on CT. Prominence of the ventricles and sulci reflects mild to moderate cortical volume loss. Scattered periventricular and subcortical white matter change likely reflects small vessel ischemic microangiopathy. The brainstem and fourth ventricle are within normal limits. The basal ganglia are unremarkable in appearance. The cerebral hemispheres demonstrate grossly normal gray-white differentiation. No mass effect or midline shift is seen. There is no evidence of fracture;  visualized osseous structures are unremarkable in appearance. A 1.2 cm soft tissue density is suggested along the superior aspect of the central right orbit, with minimal calcification, of uncertain significance. The orbits are otherwise within normal limits. The paranasal sinuses and mastoid air cells are well-aerated. No significant soft tissue abnormalities are seen. CT CERVICAL SPINE FINDINGS There is no evidence of fracture or subluxation. Multilevel disc space narrowing is noted along the cervical spine, with scattered anterior and posterior osteophyte complexes, and mild underlying facet disease. Vertebral bodies demonstrate normal height and alignment. Prevertebral soft tissues are within normal limits. The thyroid gland is unremarkable in appearance. The visualized lung apices are clear. Scattered calcification is noted at the carotid bifurcations bilaterally, with likely mild luminal narrowing. IMPRESSION: 1. No evidence of traumatic intracranial injury or fracture. 2. No evidence of fracture or subluxation along the cervical spine. 3. 1.2 cm soft tissue density suggested along the superior aspect of the central right orbit, with minimal calcification, of uncertain significance. If the patient has any right orbital symptoms, MRI of the orbits could be considered for further evaluation. 4. Mild to moderate cortical volume loss and scattered small vessel ischemic microangiopathy. 5. Mild diffuse degenerative change along the cervical spine. 6. Scattered calcification at the carotid bifurcations bilaterally, with likely mild luminal narrowing. Carotid ultrasound would be helpful for further evaluation, when and as deemed clinically appropriate. Electronically Signed   By: Garald Balding M.D.   On: 03/07/2015 00:49   Dg Shoulder Left  03/06/2015  CLINICAL DATA:  Missed a step coming out of restaurant and fell face first. Left shoulder pain. Initial encounter. EXAM: LEFT SHOULDER - 2+ VIEW COMPARISON:  None.  FINDINGS: There is a comminuted fracture involving the left humeral neck, with extension to the greater tuberosity. The left humeral head remains seated at the glenoid fossa. Mild degenerative change is noted at the left acromioclavicular joint. Soft tissue swelling is suggested about the left shoulder. The visualized portions of the left lung appear grossly clear. IMPRESSION: Comminuted fracture involving the left humeral neck, with extension to the greater tuberosity. Electronically Signed   By: Garald Balding M.D.   On: 03/06/2015 22:23   I have personally reviewed and evaluated these images and lab results as part of my medical decision-making.   EKG Interpretation   Date/Time:  Saturday March 06 2015 21:42:50 EST Ventricular Rate:  120 PR Interval:  194 QRS Duration: 54 QT Interval:  294 QTC Calculation: 415 R Axis:   -48 Text Interpretation:  Sinus tachycardia Left  axis deviation Anterior  infarct , age undetermined Abnormal ECG artifact limits interpretation  Confirmed by GOLDSTON  MD, SCOTT ([id number]) on 03/06/2015 10:00:43 PM     Filed Vitals:   03/06/15 2315 03/06/15 2330 03/06/15 2345 03/07/15 0045  BP: 137/73 158/89 154/89 162/88  Pulse: 86 87 89 91  Temp:      TempSrc:      Resp: 14 18 20 19   Height:      Weight:      SpO2: 96% 94% 95% 95%    MDM   Meds given in ED:  Medications  Tdap (BOOSTRIX) injection 0.5 mL (0.5 mLs Intramuscular Given 03/06/15 2329)  HYDROcodone-acetaminophen (NORCO/VICODIN) 5-325 MG per tablet 1 tablet (1 tablet Oral Given 03/07/15 0041)    New Prescriptions   HYDROCODONE-ACETAMINOPHEN (NORCO/VICODIN) 5-325 MG TABLET    Take 1 tablet by mouth every 4 (four) hours as needed for moderate pain or severe pain.    Final diagnoses:  Humeral surgical neck fracture, left, closed, initial encounter  Fall, initial encounter   This s a 82 y.o. male who presents to the emergency department after a trip and fall while walking out of a restaurant tonight.  The patient reports he was walking out of a restaurant in the dark when he misstepped and tripped. He fell hitting his forehead and his left shoulder. He is complaining of pain at his left shoulder and has pain with movement. He denies loss of consciousness. He denies headache or neck pain. He denies any numbness, tingling or weakness. He is not on any blood thinners. On exam patient is afebrile and nontoxic-appearing. He has no focal neurological deficits. There is abrasion to his forehead and no views. He has tenderness to his left humeral head and decreased range of motion due to pain. No deformity noted. He is neurovascularly intact. Left shoulder x-ray indicates a comminuted fracture involving the left humeral neck with extension into the greater tuberosity. CT head and neck shows no evidence of traumatic intracranial injury or fracture along the cervical spine.  Patient placed in arm sling and will follow up with Dr. Percell Miller from orthopedic surgery beginning of this week. Will provide with a small amount of Vicodin for pain control. I discussed precautions in using narcotics as they can make him drowsy, loopy and stable on his feet. Patient tolerated Norco in the emergency department well. Patient's tetanus updated in the emergency department. I advised the patient to follow-up with their primary care provider and orthopedic surgery this week. I advised the patient to return to the emergency department with new or worsening symptoms or new concerns. The patient and his son verbalized understanding and agreement with plan.    This patient was discussed with and evaluated by Dr. Regenia Skeeter who agrees with assessment and plan.    Waynetta Pean, PA-C 03/07/15 0121  Sherwood Gambler, MD 03/07/15 [phone]

## 2015-03-07 ENCOUNTER — Emergency Department (HOSPITAL_COMMUNITY): Payer: Medicare Other

## 2015-03-07 ENCOUNTER — Other Ambulatory Visit (HOSPITAL_COMMUNITY): Payer: Medicare Other

## 2015-03-07 DIAGNOSIS — S42292A Other displaced fracture of upper end of left humerus, initial encounter for closed fracture: Secondary | ICD-10-CM | POA: Diagnosis not present

## 2015-03-07 DIAGNOSIS — S199XXA Unspecified injury of neck, initial encounter: Secondary | ICD-10-CM | POA: Diagnosis not present

## 2015-03-07 DIAGNOSIS — S0990XA Unspecified injury of head, initial encounter: Secondary | ICD-10-CM | POA: Diagnosis not present

## 2015-03-07 MED ORDER — HYDROCODONE-ACETAMINOPHEN 5-325 MG PO TABS: 1.0000 | ORAL_TABLET | ORAL | Status: DC | PRN

## 2015-03-07 NOTE — Discharge Instructions (Signed)
Humerus Fracture Treated With Immobilization The humerus is the large bone in your upper arm. You have a broken (fractured) humerus. These fractures are easily diagnosed with X-rays. TREATMENT  Simple fractures which will heal without disability are treated with simple immobilization. Immobilization means you will wear a cast, splint, or sling. You have a fracture which will do well with immobilization. The fracture will heal well simply by being held in a good position until it is stable enough to begin range of motion exercises. Do not take part in activities which would further injure your arm.  HOME CARE INSTRUCTIONS   Put ice on the injured area.  Put ice in a plastic bag.  Place a towel between your skin and the bag.  Leave the ice on for 15-20 minutes, 03-04 times a day.  If you have a cast:  Do not scratch the skin under the cast using sharp or pointed objects.  Check the skin around the cast every day. You may put lotion on any red or sore areas.  Keep your cast dry and clean.  If you have a splint:  Wear the splint as directed.  Keep your splint dry and clean.  You may loosen the elastic around the splint if your fingers become numb, tingle, or turn cold or blue.  If you have a sling:  Wear the sling as directed.  Do not put pressure on any part of your cast or splint until it is fully hardened.  Your cast or splint can be protected during bathing with a plastic bag. Do not lower the cast or splint into water.  Only take over-the-counter or prescription medicines for pain, discomfort, or fever as directed by your caregiver.  Do range of motion exercises as instructed by your caregiver.  Follow up as directed by your caregiver. This is very important in order to avoid permanent injury or disability and chronic pain. SEEK IMMEDIATE MEDICAL CARE IF:   Your skin or nails in the injured arm turn blue or gray.  Your arm feels cold or numb.  You develop severe pain  in the injured arm.  You are having problems with the medicines you were given. MAKE SURE YOU:   Understand these instructions.  Will watch your condition.  Will get help right away if you are not doing well or get worse.   This information is not intended to replace advice given to you by your health care provider. Make sure you discuss any questions you have with your health care provider.   Document Released: 03/27/2000 Document Revised: 01/09/2014 Document Reviewed: 05/13/2014 Elsevier Interactive Patient Education 2016 [employer].  How to Use a Sling A sling is a type of hanging bandage that is worn around your neck to protect an injured arm, shoulder, or other body part. You may need to wear a sling to keep you from moving (immobilize) the injured body part while it heals. Keeping the injured part of your body still reduces pain and speeds up healing. Your health care provider may recommend using a sling if you have:   A broken arm.  A broken collarbone.  A shoulder injury.  Surgery. RISKS AND COMPLICATIONS Wearing a sling the wrong way can:  Make your injury worse.  Cause stiffness or numbness.  Affect blood circulation in your arm and hand. This can causetingling or numbness in your fingers or hands. HOW TO USE A SLING The way that you should use a sling depends on your injury. It  is important that you follow all of your health care provider's instructions for your injury. Also follow these general guidelines:  Wear the sling so that your arm bends 90 degrees at the elbow. That is like a right angle or the shape of a capital letter "L." The sling should also support your wrist and your hand.  Try to avoid moving your arm.  Do not lie down flat on your back while wearing a sling. Sleep in a recliner or use pillows to raise your upper body in bed.  Do not twist, raise, or move your arm in a way that could make your injury worse.  Do not lean on your arm while  wearing a sling.  Do not lift anything while wearing a sling. SEEK MEDICAL CARE IF:  You have bruising, swelling, or pain that is getting worse.  Your pain medicine is not helping.  You have a fever. SEEK IMMEDIATE MEDICAL CARE IF:  Your fingers are numb or tingling.  Your fingers turn blue or feel cold to the touch.  You cannot control the bleeding from your injury.  You are short of breath.   This information is not intended to replace advice given to you by your health care provider. Make sure you discuss any questions you have with your health care provider.   Document Released: 08/03/2003 Document Revised: 01/09/2014 Document Reviewed: 10/22/2013 Elsevier Interactive Patient Education [employer].

## 2015-03-08 ENCOUNTER — Telehealth (HOSPITAL_COMMUNITY): Payer: Self-pay

## 2015-03-10 DIAGNOSIS — S42295A Other nondisplaced fracture of upper end of left humerus, initial encounter for closed fracture: Secondary | ICD-10-CM | POA: Diagnosis not present

## 2015-03-10 DIAGNOSIS — M25512 Pain in left shoulder: Secondary | ICD-10-CM | POA: Diagnosis not present

## 2015-03-12 DIAGNOSIS — J449 Chronic obstructive pulmonary disease, unspecified: Secondary | ICD-10-CM | POA: Diagnosis not present

## 2015-03-12 DIAGNOSIS — E1151 Type 2 diabetes mellitus with diabetic peripheral angiopathy without gangrene: Secondary | ICD-10-CM | POA: Diagnosis not present

## 2015-03-12 DIAGNOSIS — Z9181 History of falling: Secondary | ICD-10-CM | POA: Diagnosis not present

## 2015-03-12 DIAGNOSIS — S42202D Unspecified fracture of upper end of left humerus, subsequent encounter for fracture with routine healing: Secondary | ICD-10-CM | POA: Diagnosis not present

## 2015-03-12 DIAGNOSIS — I2510 Atherosclerotic heart disease of native coronary artery without angina pectoris: Secondary | ICD-10-CM | POA: Diagnosis not present

## 2015-03-12 DIAGNOSIS — M109 Gout, unspecified: Secondary | ICD-10-CM | POA: Diagnosis not present

## 2015-03-15 DIAGNOSIS — S42202D Unspecified fracture of upper end of left humerus, subsequent encounter for fracture with routine healing: Secondary | ICD-10-CM | POA: Diagnosis not present

## 2015-03-15 DIAGNOSIS — J449 Chronic obstructive pulmonary disease, unspecified: Secondary | ICD-10-CM | POA: Diagnosis not present

## 2015-03-15 DIAGNOSIS — E1151 Type 2 diabetes mellitus with diabetic peripheral angiopathy without gangrene: Secondary | ICD-10-CM | POA: Diagnosis not present

## 2015-03-15 DIAGNOSIS — I2510 Atherosclerotic heart disease of native coronary artery without angina pectoris: Secondary | ICD-10-CM | POA: Diagnosis not present

## 2015-03-15 DIAGNOSIS — Z9181 History of falling: Secondary | ICD-10-CM | POA: Diagnosis not present

## 2015-03-15 DIAGNOSIS — M109 Gout, unspecified: Secondary | ICD-10-CM | POA: Diagnosis not present

## 2015-03-16 DIAGNOSIS — I2510 Atherosclerotic heart disease of native coronary artery without angina pectoris: Secondary | ICD-10-CM | POA: Diagnosis not present

## 2015-03-16 DIAGNOSIS — S42202D Unspecified fracture of upper end of left humerus, subsequent encounter for fracture with routine healing: Secondary | ICD-10-CM | POA: Diagnosis not present

## 2015-03-16 DIAGNOSIS — E1151 Type 2 diabetes mellitus with diabetic peripheral angiopathy without gangrene: Secondary | ICD-10-CM | POA: Diagnosis not present

## 2015-03-16 DIAGNOSIS — Z9181 History of falling: Secondary | ICD-10-CM | POA: Diagnosis not present

## 2015-03-16 DIAGNOSIS — J449 Chronic obstructive pulmonary disease, unspecified: Secondary | ICD-10-CM | POA: Diagnosis not present

## 2015-03-16 DIAGNOSIS — M109 Gout, unspecified: Secondary | ICD-10-CM | POA: Diagnosis not present

## 2015-03-17 DIAGNOSIS — E1151 Type 2 diabetes mellitus with diabetic peripheral angiopathy without gangrene: Secondary | ICD-10-CM | POA: Diagnosis not present

## 2015-03-17 DIAGNOSIS — I2510 Atherosclerotic heart disease of native coronary artery without angina pectoris: Secondary | ICD-10-CM | POA: Diagnosis not present

## 2015-03-17 DIAGNOSIS — Z9181 History of falling: Secondary | ICD-10-CM | POA: Diagnosis not present

## 2015-03-17 DIAGNOSIS — J449 Chronic obstructive pulmonary disease, unspecified: Secondary | ICD-10-CM | POA: Diagnosis not present

## 2015-03-17 DIAGNOSIS — S42202D Unspecified fracture of upper end of left humerus, subsequent encounter for fracture with routine healing: Secondary | ICD-10-CM | POA: Diagnosis not present

## 2015-03-17 DIAGNOSIS — M109 Gout, unspecified: Secondary | ICD-10-CM | POA: Diagnosis not present

## 2015-03-18 DIAGNOSIS — Z9181 History of falling: Secondary | ICD-10-CM | POA: Diagnosis not present

## 2015-03-18 DIAGNOSIS — J449 Chronic obstructive pulmonary disease, unspecified: Secondary | ICD-10-CM | POA: Diagnosis not present

## 2015-03-18 DIAGNOSIS — I2510 Atherosclerotic heart disease of native coronary artery without angina pectoris: Secondary | ICD-10-CM | POA: Diagnosis not present

## 2015-03-18 DIAGNOSIS — S42202D Unspecified fracture of upper end of left humerus, subsequent encounter for fracture with routine healing: Secondary | ICD-10-CM | POA: Diagnosis not present

## 2015-03-18 DIAGNOSIS — M109 Gout, unspecified: Secondary | ICD-10-CM | POA: Diagnosis not present

## 2015-03-18 DIAGNOSIS — E1151 Type 2 diabetes mellitus with diabetic peripheral angiopathy without gangrene: Secondary | ICD-10-CM | POA: Diagnosis not present

## 2015-03-19 DIAGNOSIS — D649 Anemia, unspecified: Secondary | ICD-10-CM | POA: Diagnosis not present

## 2015-03-19 DIAGNOSIS — R9431 Abnormal electrocardiogram [ECG] [EKG]: Secondary | ICD-10-CM | POA: Diagnosis not present

## 2015-03-19 DIAGNOSIS — L03116 Cellulitis of left lower limb: Secondary | ICD-10-CM | POA: Diagnosis not present

## 2015-03-20 DIAGNOSIS — M79606 Pain in leg, unspecified: Secondary | ICD-10-CM | POA: Diagnosis not present

## 2015-03-20 DIAGNOSIS — Z794 Long term (current) use of insulin: Secondary | ICD-10-CM | POA: Diagnosis not present

## 2015-03-20 DIAGNOSIS — E1142 Type 2 diabetes mellitus with diabetic polyneuropathy: Secondary | ICD-10-CM | POA: Diagnosis not present

## 2015-03-22 DIAGNOSIS — J449 Chronic obstructive pulmonary disease, unspecified: Secondary | ICD-10-CM | POA: Diagnosis not present

## 2015-03-22 DIAGNOSIS — S42202D Unspecified fracture of upper end of left humerus, subsequent encounter for fracture with routine healing: Secondary | ICD-10-CM | POA: Diagnosis not present

## 2015-03-22 DIAGNOSIS — R609 Edema, unspecified: Secondary | ICD-10-CM | POA: Diagnosis not present

## 2015-03-22 DIAGNOSIS — Z9181 History of falling: Secondary | ICD-10-CM | POA: Diagnosis not present

## 2015-03-22 DIAGNOSIS — L03119 Cellulitis of unspecified part of limb: Secondary | ICD-10-CM | POA: Diagnosis not present

## 2015-03-22 DIAGNOSIS — M109 Gout, unspecified: Secondary | ICD-10-CM | POA: Diagnosis not present

## 2015-03-22 DIAGNOSIS — I2510 Atherosclerotic heart disease of native coronary artery without angina pectoris: Secondary | ICD-10-CM | POA: Diagnosis not present

## 2015-03-22 DIAGNOSIS — E1151 Type 2 diabetes mellitus with diabetic peripheral angiopathy without gangrene: Secondary | ICD-10-CM | POA: Diagnosis not present

## 2015-03-23 DIAGNOSIS — S42202D Unspecified fracture of upper end of left humerus, subsequent encounter for fracture with routine healing: Secondary | ICD-10-CM | POA: Diagnosis not present

## 2015-03-23 DIAGNOSIS — E1151 Type 2 diabetes mellitus with diabetic peripheral angiopathy without gangrene: Secondary | ICD-10-CM | POA: Diagnosis not present

## 2015-03-23 DIAGNOSIS — I2510 Atherosclerotic heart disease of native coronary artery without angina pectoris: Secondary | ICD-10-CM | POA: Diagnosis not present

## 2015-03-23 DIAGNOSIS — M109 Gout, unspecified: Secondary | ICD-10-CM | POA: Diagnosis not present

## 2015-03-23 DIAGNOSIS — J449 Chronic obstructive pulmonary disease, unspecified: Secondary | ICD-10-CM | POA: Diagnosis not present

## 2015-03-23 DIAGNOSIS — Z9181 History of falling: Secondary | ICD-10-CM | POA: Diagnosis not present

## 2015-03-24 DIAGNOSIS — S42295D Other nondisplaced fracture of upper end of left humerus, subsequent encounter for fracture with routine healing: Secondary | ICD-10-CM | POA: Diagnosis not present

## 2015-03-24 DIAGNOSIS — S42202D Unspecified fracture of upper end of left humerus, subsequent encounter for fracture with routine healing: Secondary | ICD-10-CM | POA: Diagnosis not present

## 2015-03-24 DIAGNOSIS — J449 Chronic obstructive pulmonary disease, unspecified: Secondary | ICD-10-CM | POA: Diagnosis not present

## 2015-03-24 DIAGNOSIS — E1151 Type 2 diabetes mellitus with diabetic peripheral angiopathy without gangrene: Secondary | ICD-10-CM | POA: Diagnosis not present

## 2015-03-24 DIAGNOSIS — L03116 Cellulitis of left lower limb: Secondary | ICD-10-CM | POA: Diagnosis not present

## 2015-03-24 DIAGNOSIS — Z9181 History of falling: Secondary | ICD-10-CM | POA: Diagnosis not present

## 2015-03-24 DIAGNOSIS — R609 Edema, unspecified: Secondary | ICD-10-CM | POA: Diagnosis not present

## 2015-03-24 DIAGNOSIS — I2510 Atherosclerotic heart disease of native coronary artery without angina pectoris: Secondary | ICD-10-CM | POA: Diagnosis not present

## 2015-03-24 DIAGNOSIS — M109 Gout, unspecified: Secondary | ICD-10-CM | POA: Diagnosis not present

## 2015-03-26 ENCOUNTER — Telehealth: Payer: Self-pay | Admitting: Physician Assistant

## 2015-03-26 DIAGNOSIS — E1151 Type 2 diabetes mellitus with diabetic peripheral angiopathy without gangrene: Secondary | ICD-10-CM | POA: Diagnosis not present

## 2015-03-26 DIAGNOSIS — Z9181 History of falling: Secondary | ICD-10-CM | POA: Diagnosis not present

## 2015-03-26 DIAGNOSIS — M109 Gout, unspecified: Secondary | ICD-10-CM | POA: Diagnosis not present

## 2015-03-26 DIAGNOSIS — I2510 Atherosclerotic heart disease of native coronary artery without angina pectoris: Secondary | ICD-10-CM | POA: Diagnosis not present

## 2015-03-26 DIAGNOSIS — S42202D Unspecified fracture of upper end of left humerus, subsequent encounter for fracture with routine healing: Secondary | ICD-10-CM | POA: Diagnosis not present

## 2015-03-26 DIAGNOSIS — J449 Chronic obstructive pulmonary disease, unspecified: Secondary | ICD-10-CM | POA: Diagnosis not present

## 2015-03-26 NOTE — Telephone Encounter (Signed)
Received records from [place] for appointment on 3/38/17 with Rosaria Ferries, PA.  Records given to [employer] (medical records) for Rhonda's schedule on 03/30/15. lp

## 2015-03-29 DIAGNOSIS — D649 Anemia, unspecified: Secondary | ICD-10-CM | POA: Diagnosis not present

## 2015-03-30 ENCOUNTER — Encounter: Payer: Medicare Other | Admitting: Physician Assistant

## 2015-03-30 DIAGNOSIS — R609 Edema, unspecified: Secondary | ICD-10-CM | POA: Diagnosis not present

## 2015-03-30 NOTE — Progress Notes (Signed)
    Cardiology Office Note   Date:  03/30/2015   ID:  Pauline Aus., DOB [birth year], MRN [id number]  PCP:  Fulton Reek, MD  Cardiologist:  Dr Stacy Gardner, PA-C   No chief complaint on file.    History of Present Illness: Ulice Czech. is a 82 y.o. male with a history of DM, HTN, COPD, PAD (L-SFA 100%), hypothyroid, gout, periph neuropathy.  Seen in ER 03/04 after a fall, L humeral neck fx, ECG? abnl Seen in Dr Eddie Dibbles office 03/17 and rx for cellulitis LLE   This encounter was created in error - please disregard.

## 2015-03-31 DIAGNOSIS — Z9181 History of falling: Secondary | ICD-10-CM | POA: Diagnosis not present

## 2015-03-31 DIAGNOSIS — E1151 Type 2 diabetes mellitus with diabetic peripheral angiopathy without gangrene: Secondary | ICD-10-CM | POA: Diagnosis not present

## 2015-03-31 DIAGNOSIS — M109 Gout, unspecified: Secondary | ICD-10-CM | POA: Diagnosis not present

## 2015-03-31 DIAGNOSIS — I2510 Atherosclerotic heart disease of native coronary artery without angina pectoris: Secondary | ICD-10-CM | POA: Diagnosis not present

## 2015-03-31 DIAGNOSIS — J449 Chronic obstructive pulmonary disease, unspecified: Secondary | ICD-10-CM | POA: Diagnosis not present

## 2015-03-31 DIAGNOSIS — S42202D Unspecified fracture of upper end of left humerus, subsequent encounter for fracture with routine healing: Secondary | ICD-10-CM | POA: Diagnosis not present

## 2015-04-01 ENCOUNTER — Encounter: Payer: Self-pay | Admitting: *Deleted

## 2015-04-02 DIAGNOSIS — J449 Chronic obstructive pulmonary disease, unspecified: Secondary | ICD-10-CM | POA: Diagnosis not present

## 2015-04-02 DIAGNOSIS — I2510 Atherosclerotic heart disease of native coronary artery without angina pectoris: Secondary | ICD-10-CM | POA: Diagnosis not present

## 2015-04-02 DIAGNOSIS — Z9181 History of falling: Secondary | ICD-10-CM | POA: Diagnosis not present

## 2015-04-02 DIAGNOSIS — E1151 Type 2 diabetes mellitus with diabetic peripheral angiopathy without gangrene: Secondary | ICD-10-CM | POA: Diagnosis not present

## 2015-04-02 DIAGNOSIS — M109 Gout, unspecified: Secondary | ICD-10-CM | POA: Diagnosis not present

## 2015-04-02 DIAGNOSIS — S42202D Unspecified fracture of upper end of left humerus, subsequent encounter for fracture with routine healing: Secondary | ICD-10-CM | POA: Diagnosis not present

## 2015-04-05 DIAGNOSIS — E1151 Type 2 diabetes mellitus with diabetic peripheral angiopathy without gangrene: Secondary | ICD-10-CM | POA: Diagnosis not present

## 2015-04-05 DIAGNOSIS — J449 Chronic obstructive pulmonary disease, unspecified: Secondary | ICD-10-CM | POA: Diagnosis not present

## 2015-04-05 DIAGNOSIS — I2510 Atherosclerotic heart disease of native coronary artery without angina pectoris: Secondary | ICD-10-CM | POA: Diagnosis not present

## 2015-04-05 DIAGNOSIS — Z9181 History of falling: Secondary | ICD-10-CM | POA: Diagnosis not present

## 2015-04-05 DIAGNOSIS — S42202D Unspecified fracture of upper end of left humerus, subsequent encounter for fracture with routine healing: Secondary | ICD-10-CM | POA: Diagnosis not present

## 2015-04-05 DIAGNOSIS — M109 Gout, unspecified: Secondary | ICD-10-CM | POA: Diagnosis not present

## 2015-04-06 ENCOUNTER — Ambulatory Visit: Payer: Medicare Other | Admitting: Physician Assistant

## 2015-04-07 ENCOUNTER — Encounter: Payer: Self-pay | Admitting: Cardiovascular Disease

## 2015-04-07 ENCOUNTER — Ambulatory Visit (INDEPENDENT_AMBULATORY_CARE_PROVIDER_SITE_OTHER): Payer: Medicare Other | Admitting: Cardiovascular Disease

## 2015-04-07 VITALS — BP 138/64 | HR 98 | Ht 68.0 in | Wt 232.8 lb

## 2015-04-07 DIAGNOSIS — M109 Gout, unspecified: Secondary | ICD-10-CM | POA: Diagnosis not present

## 2015-04-07 DIAGNOSIS — E785 Hyperlipidemia, unspecified: Secondary | ICD-10-CM | POA: Diagnosis not present

## 2015-04-07 DIAGNOSIS — Z9181 History of falling: Secondary | ICD-10-CM | POA: Diagnosis not present

## 2015-04-07 DIAGNOSIS — S42202D Unspecified fracture of upper end of left humerus, subsequent encounter for fracture with routine healing: Secondary | ICD-10-CM | POA: Diagnosis not present

## 2015-04-07 DIAGNOSIS — I2510 Atherosclerotic heart disease of native coronary artery without angina pectoris: Secondary | ICD-10-CM

## 2015-04-07 DIAGNOSIS — I739 Peripheral vascular disease, unspecified: Secondary | ICD-10-CM | POA: Diagnosis not present

## 2015-04-07 DIAGNOSIS — I2583 Coronary atherosclerosis due to lipid rich plaque: Secondary | ICD-10-CM

## 2015-04-07 DIAGNOSIS — I10 Essential (primary) hypertension: Secondary | ICD-10-CM

## 2015-04-07 DIAGNOSIS — E1151 Type 2 diabetes mellitus with diabetic peripheral angiopathy without gangrene: Secondary | ICD-10-CM | POA: Diagnosis not present

## 2015-04-07 DIAGNOSIS — J449 Chronic obstructive pulmonary disease, unspecified: Secondary | ICD-10-CM | POA: Diagnosis not present

## 2015-04-07 NOTE — Assessment & Plan Note (Signed)
History of hyperlipidemia on statin therapy followed by his PCP 

## 2015-04-07 NOTE — Progress Notes (Signed)
04/07/2015 Ernest Clark.   Mar 02, 1931  [id number]  Primary Physician Ernest Clark, Ernest Craze, MD Primary Cardiologist: Ernest Harp MD Ernest Clark   HPI:  [name] is a delightful 82 year old moderately overweight divorced Caucasian male father of 2 children, grandfather high grandchildren referred by Dr. Amalia Hailey, podiatrist, for peripheral vascular evaluation because of claudication. I last saw him in the office 09/25/13. He is retired at working as a Furniture conservator/restorer at [employer]. His primary care physician is Dr. Hulan Clark. His cardiac risk factor profile is notable for over 100 pack years of tobacco abuse having quit 5 years ago as well as history of hypertension, diabetes or hyperlipidemia. He has never had a heart attack or stroke. He denies chest pain or shortness of breath. He has had a remote PCI. He also has what sounds like diabetic peripheral neuropathy with burning in his feet. He had Dopplers in our office performed 08/27/13 revealing a right ABI 0.65 with moderate SFA disease, left ABI 0.51 with occluded distal left SFA. He really does not complain of calf claudication. He recently fell and sustained a fractured bone. At the time for suppression of A. Fib on his EKG from closer inspection it appears to be sinus rhythm.   Current Outpatient Prescriptions  Medication Sig Dispense Refill  . albuterol (PROAIR HFA) 108 (90 BASE) MCG/ACT inhaler Inhale 2 puffs into the lungs every 6 (six) hours as needed for shortness of breath.     . allopurinol (ZYLOPRIM) 100 MG tablet TK 1 T PO D  12  . amoxicillin-clavulanate (AUGMENTIN) 875-125 MG tablet TK 1 T PO  Q 12 H  1  . aspirin 81 MG tablet Take 81 mg by mouth daily.     . betamethasone dipropionate (DIPROLENE) 0.05 % cream Apply topically 2 (two) times daily.      . cephALEXin (KEFLEX) 500 MG capsule TK ONE C PO  TID X 10 DAYS  0  . ezetimibe (ZETIA) 10 MG tablet Take 10 mg by mouth daily.      Marland Kitchen FLUZONE HIGH-DOSE 0.5 ML  SUSY ADM 0.5ML IM UTD  0  . gabapentin (NEURONTIN) 300 MG capsule TAKE ONE CAPSULE ( 300 MG TOTAL) BY MOUTH AT BEDTIME 30 capsule 0  . glimepiride (AMARYL) 2 MG tablet Take 2 mg by mouth 2 (two) times daily.      Marland Kitchen HYDROcodone-acetaminophen (NORCO/VICODIN) 5-325 MG tablet Take 1 tablet by mouth every 4 (four) hours as needed for moderate pain or severe pain. 12 tablet 0  . indomethacin (INDOCIN) 25 MG capsule Take 25 mg by mouth as needed.      . Mometasone Furo-Formoterol Fum (DULERA) 200-5 MCG/ACT AERO Inhale 2 puffs into the lungs 2 (two) times daily. 1 Inhaler 11  . montelukast (SINGULAIR) 10 MG tablet Take 10 mg by mouth daily.      Marland Kitchen olmesartan (BENICAR) 40 MG tablet One half daily 15 tablet 5  . rosuvastatin (CRESTOR) 40 MG tablet Take 40 mg by mouth daily.      . sildenafil (VIAGRA) 100 MG tablet Take 100 mg by mouth daily as needed.      . sitaGLIPtin (JANUVIA) 100 MG tablet Take 100 mg by mouth daily.    . Skin Protectants, Misc. (EUCERIN) cream Apply 1 application topically as needed for dry skin.    Marland Kitchen triamcinolone cream (KENALOG) 0.1 % APP EXT  TO LEGS ATN  0  . triamterene-hydrochlorothiazide (DYAZIDE) 37.5-25 MG capsule TK ONE C PO  QAM  6  . furosemide (LASIX) 20 MG tablet Take 40 mg by mouth daily.   0  . traMADol (ULTRAM) 50 MG tablet 50 mg every 12 (twelve) hours as needed.   0   No current facility-administered medications for this visit.    Allergies  Allergen Reactions  . Actos [Pioglitazone Hydrochloride] Other (See Comments)    RIGHT LEG SWELLING   . Metformin And Related Other (See Comments)    Social History   Social History  . Marital Status: Divorced    Spouse Name: N/A  . Number of Children: 2  . Years of Education: N/A   Occupational History  . retired from [employer]  . Smoking status: Former Smoker -- 2.00 packs/day for 60 years    Types: Cigarettes    Quit date: 01/02/2001  . Smokeless tobacco: Former Systems developer     Types: Chew  . Alcohol Use: No  . Drug Use: No  . Sexual Activity: Not on file   Other Topics Concern  . Not on file   Social History Narrative     Review of Systems: General: negative for chills, fever, night sweats or weight changes.  Cardiovascular: negative for chest pain, dyspnea on exertion, edema, orthopnea, palpitations, paroxysmal nocturnal dyspnea or shortness of breath Dermatological: negative for rash Respiratory: negative for cough or wheezing Urologic: negative for hematuria Abdominal: negative for nausea, vomiting, diarrhea, bright red blood per rectum, melena, or hematemesis Neurologic: negative for visual changes, syncope, or dizziness All other systems reviewed and are otherwise negative except as noted above.    Blood pressure 138/64, pulse 98, height 5\' 8"  (1.727 m), weight 232 lb 12.8 oz (105.597 kg).  General appearance: alert and no distress Neck: no adenopathy, no carotid bruit, no JVD, supple, symmetrical, trachea midline and thyroid not enlarged, symmetric, no tenderness/mass/nodules Lungs: clear to auscultation bilaterally Heart: regular rate and rhythm, S1, S2 normal, no murmur, click, rub or gallop Extremities: extremities normal, atraumatic, no cyanosis or edema  EKG not performed today  ASSESSMENT AND PLAN:   Peripheral arterial disease History of peripheral arterial disease with Dopplers performed 08/27/13 revealing a right ABI 0.65 with moderate SFA disease and a left ABI 0.51 with an occluded left SFA. He does have diabetic peripheral neuropathy as well as arthritic changes. He denies claudication.  Coronary artery disease History of CAD with remote PCI. The patient denies chest pain  Essential hypertension History of hypertension blood pressure measured at 138/64. He is on Dyazide. Continue current meds at current dosing  Hyperlipidemia History of hyperlipidemia on statin therapy followed by his PCP      Ernest Harp MD  [facility], [facility] 04/07/2015 3:01 PM

## 2015-04-07 NOTE — Patient Instructions (Signed)

## 2015-04-07 NOTE — Assessment & Plan Note (Signed)
History of hypertension blood pressure measured at 138/64. He is on Dyazide. Continue current meds at current dosing

## 2015-04-07 NOTE — Assessment & Plan Note (Signed)
History of CAD with remote PCI. The patient denies chest pain

## 2015-04-07 NOTE — Assessment & Plan Note (Signed)
History of peripheral arterial disease with Dopplers performed 08/27/13 revealing a right ABI 0.65 with moderate SFA disease and a left ABI 0.51 with an occluded left SFA. He does have diabetic peripheral neuropathy as well as arthritic changes. He denies claudication.

## 2015-04-09 DIAGNOSIS — Z9181 History of falling: Secondary | ICD-10-CM | POA: Diagnosis not present

## 2015-04-09 DIAGNOSIS — J449 Chronic obstructive pulmonary disease, unspecified: Secondary | ICD-10-CM | POA: Diagnosis not present

## 2015-04-09 DIAGNOSIS — M109 Gout, unspecified: Secondary | ICD-10-CM | POA: Diagnosis not present

## 2015-04-09 DIAGNOSIS — I2510 Atherosclerotic heart disease of native coronary artery without angina pectoris: Secondary | ICD-10-CM | POA: Diagnosis not present

## 2015-04-09 DIAGNOSIS — E1151 Type 2 diabetes mellitus with diabetic peripheral angiopathy without gangrene: Secondary | ICD-10-CM | POA: Diagnosis not present

## 2015-04-09 DIAGNOSIS — S42202D Unspecified fracture of upper end of left humerus, subsequent encounter for fracture with routine healing: Secondary | ICD-10-CM | POA: Diagnosis not present

## 2015-04-14 DIAGNOSIS — Z9181 History of falling: Secondary | ICD-10-CM | POA: Diagnosis not present

## 2015-04-14 DIAGNOSIS — M109 Gout, unspecified: Secondary | ICD-10-CM | POA: Diagnosis not present

## 2015-04-14 DIAGNOSIS — I2510 Atherosclerotic heart disease of native coronary artery without angina pectoris: Secondary | ICD-10-CM | POA: Diagnosis not present

## 2015-04-14 DIAGNOSIS — S42202D Unspecified fracture of upper end of left humerus, subsequent encounter for fracture with routine healing: Secondary | ICD-10-CM | POA: Diagnosis not present

## 2015-04-14 DIAGNOSIS — J449 Chronic obstructive pulmonary disease, unspecified: Secondary | ICD-10-CM | POA: Diagnosis not present

## 2015-04-14 DIAGNOSIS — E1151 Type 2 diabetes mellitus with diabetic peripheral angiopathy without gangrene: Secondary | ICD-10-CM | POA: Diagnosis not present

## 2015-04-15 DIAGNOSIS — I2510 Atherosclerotic heart disease of native coronary artery without angina pectoris: Secondary | ICD-10-CM | POA: Diagnosis not present

## 2015-04-15 DIAGNOSIS — Z9181 History of falling: Secondary | ICD-10-CM | POA: Diagnosis not present

## 2015-04-15 DIAGNOSIS — M109 Gout, unspecified: Secondary | ICD-10-CM | POA: Diagnosis not present

## 2015-04-15 DIAGNOSIS — J449 Chronic obstructive pulmonary disease, unspecified: Secondary | ICD-10-CM | POA: Diagnosis not present

## 2015-04-15 DIAGNOSIS — E1151 Type 2 diabetes mellitus with diabetic peripheral angiopathy without gangrene: Secondary | ICD-10-CM | POA: Diagnosis not present

## 2015-04-15 DIAGNOSIS — S42202D Unspecified fracture of upper end of left humerus, subsequent encounter for fracture with routine healing: Secondary | ICD-10-CM | POA: Diagnosis not present

## 2015-04-19 DIAGNOSIS — M109 Gout, unspecified: Secondary | ICD-10-CM | POA: Diagnosis not present

## 2015-04-19 DIAGNOSIS — I2510 Atherosclerotic heart disease of native coronary artery without angina pectoris: Secondary | ICD-10-CM | POA: Diagnosis not present

## 2015-04-19 DIAGNOSIS — S42202D Unspecified fracture of upper end of left humerus, subsequent encounter for fracture with routine healing: Secondary | ICD-10-CM | POA: Diagnosis not present

## 2015-04-19 DIAGNOSIS — E1151 Type 2 diabetes mellitus with diabetic peripheral angiopathy without gangrene: Secondary | ICD-10-CM | POA: Diagnosis not present

## 2015-04-19 DIAGNOSIS — J449 Chronic obstructive pulmonary disease, unspecified: Secondary | ICD-10-CM | POA: Diagnosis not present

## 2015-04-19 DIAGNOSIS — Z9181 History of falling: Secondary | ICD-10-CM | POA: Diagnosis not present

## 2015-04-20 DIAGNOSIS — S42202D Unspecified fracture of upper end of left humerus, subsequent encounter for fracture with routine healing: Secondary | ICD-10-CM | POA: Diagnosis not present

## 2015-04-20 DIAGNOSIS — E1151 Type 2 diabetes mellitus with diabetic peripheral angiopathy without gangrene: Secondary | ICD-10-CM | POA: Diagnosis not present

## 2015-04-20 DIAGNOSIS — Z9181 History of falling: Secondary | ICD-10-CM | POA: Diagnosis not present

## 2015-04-20 DIAGNOSIS — J449 Chronic obstructive pulmonary disease, unspecified: Secondary | ICD-10-CM | POA: Diagnosis not present

## 2015-04-20 DIAGNOSIS — I2510 Atherosclerotic heart disease of native coronary artery without angina pectoris: Secondary | ICD-10-CM | POA: Diagnosis not present

## 2015-04-20 DIAGNOSIS — M109 Gout, unspecified: Secondary | ICD-10-CM | POA: Diagnosis not present

## 2015-04-21 DIAGNOSIS — E785 Hyperlipidemia, unspecified: Secondary | ICD-10-CM | POA: Diagnosis not present

## 2015-04-21 DIAGNOSIS — R609 Edema, unspecified: Secondary | ICD-10-CM | POA: Diagnosis not present

## 2015-04-21 DIAGNOSIS — D649 Anemia, unspecified: Secondary | ICD-10-CM | POA: Diagnosis not present

## 2015-04-21 DIAGNOSIS — S42295D Other nondisplaced fracture of upper end of left humerus, subsequent encounter for fracture with routine healing: Secondary | ICD-10-CM | POA: Diagnosis not present

## 2015-04-21 DIAGNOSIS — I872 Venous insufficiency (chronic) (peripheral): Secondary | ICD-10-CM | POA: Diagnosis not present

## 2015-04-21 DIAGNOSIS — E039 Hypothyroidism, unspecified: Secondary | ICD-10-CM | POA: Diagnosis not present

## 2015-04-21 DIAGNOSIS — I10 Essential (primary) hypertension: Secondary | ICD-10-CM | POA: Diagnosis not present

## 2015-04-21 DIAGNOSIS — E1142 Type 2 diabetes mellitus with diabetic polyneuropathy: Secondary | ICD-10-CM | POA: Diagnosis not present

## 2015-04-21 DIAGNOSIS — Z794 Long term (current) use of insulin: Secondary | ICD-10-CM | POA: Diagnosis not present

## 2015-04-22 DIAGNOSIS — E1151 Type 2 diabetes mellitus with diabetic peripheral angiopathy without gangrene: Secondary | ICD-10-CM | POA: Diagnosis not present

## 2015-04-22 DIAGNOSIS — M109 Gout, unspecified: Secondary | ICD-10-CM | POA: Diagnosis not present

## 2015-04-22 DIAGNOSIS — Z9181 History of falling: Secondary | ICD-10-CM | POA: Diagnosis not present

## 2015-04-22 DIAGNOSIS — S42202D Unspecified fracture of upper end of left humerus, subsequent encounter for fracture with routine healing: Secondary | ICD-10-CM | POA: Diagnosis not present

## 2015-04-22 DIAGNOSIS — J449 Chronic obstructive pulmonary disease, unspecified: Secondary | ICD-10-CM | POA: Diagnosis not present

## 2015-04-22 DIAGNOSIS — I2510 Atherosclerotic heart disease of native coronary artery without angina pectoris: Secondary | ICD-10-CM | POA: Diagnosis not present

## 2015-04-27 DIAGNOSIS — M109 Gout, unspecified: Secondary | ICD-10-CM | POA: Diagnosis not present

## 2015-04-27 DIAGNOSIS — Z9181 History of falling: Secondary | ICD-10-CM | POA: Diagnosis not present

## 2015-04-27 DIAGNOSIS — S42202D Unspecified fracture of upper end of left humerus, subsequent encounter for fracture with routine healing: Secondary | ICD-10-CM | POA: Diagnosis not present

## 2015-04-27 DIAGNOSIS — J449 Chronic obstructive pulmonary disease, unspecified: Secondary | ICD-10-CM | POA: Diagnosis not present

## 2015-04-27 DIAGNOSIS — I2510 Atherosclerotic heart disease of native coronary artery without angina pectoris: Secondary | ICD-10-CM | POA: Diagnosis not present

## 2015-04-27 DIAGNOSIS — E1151 Type 2 diabetes mellitus with diabetic peripheral angiopathy without gangrene: Secondary | ICD-10-CM | POA: Diagnosis not present

## 2015-04-29 DIAGNOSIS — Z9181 History of falling: Secondary | ICD-10-CM | POA: Diagnosis not present

## 2015-04-29 DIAGNOSIS — E1151 Type 2 diabetes mellitus with diabetic peripheral angiopathy without gangrene: Secondary | ICD-10-CM | POA: Diagnosis not present

## 2015-04-29 DIAGNOSIS — I2510 Atherosclerotic heart disease of native coronary artery without angina pectoris: Secondary | ICD-10-CM | POA: Diagnosis not present

## 2015-04-29 DIAGNOSIS — J449 Chronic obstructive pulmonary disease, unspecified: Secondary | ICD-10-CM | POA: Diagnosis not present

## 2015-04-29 DIAGNOSIS — S42202D Unspecified fracture of upper end of left humerus, subsequent encounter for fracture with routine healing: Secondary | ICD-10-CM | POA: Diagnosis not present

## 2015-04-29 DIAGNOSIS — M109 Gout, unspecified: Secondary | ICD-10-CM | POA: Diagnosis not present

## 2015-04-30 DIAGNOSIS — I2510 Atherosclerotic heart disease of native coronary artery without angina pectoris: Secondary | ICD-10-CM | POA: Diagnosis not present

## 2015-04-30 DIAGNOSIS — M109 Gout, unspecified: Secondary | ICD-10-CM | POA: Diagnosis not present

## 2015-04-30 DIAGNOSIS — Z9181 History of falling: Secondary | ICD-10-CM | POA: Diagnosis not present

## 2015-04-30 DIAGNOSIS — S42202D Unspecified fracture of upper end of left humerus, subsequent encounter for fracture with routine healing: Secondary | ICD-10-CM | POA: Diagnosis not present

## 2015-04-30 DIAGNOSIS — E1151 Type 2 diabetes mellitus with diabetic peripheral angiopathy without gangrene: Secondary | ICD-10-CM | POA: Diagnosis not present

## 2015-04-30 DIAGNOSIS — J449 Chronic obstructive pulmonary disease, unspecified: Secondary | ICD-10-CM | POA: Diagnosis not present

## 2015-05-03 DIAGNOSIS — M109 Gout, unspecified: Secondary | ICD-10-CM | POA: Diagnosis not present

## 2015-05-03 DIAGNOSIS — S42202D Unspecified fracture of upper end of left humerus, subsequent encounter for fracture with routine healing: Secondary | ICD-10-CM | POA: Diagnosis not present

## 2015-05-03 DIAGNOSIS — I2510 Atherosclerotic heart disease of native coronary artery without angina pectoris: Secondary | ICD-10-CM | POA: Diagnosis not present

## 2015-05-03 DIAGNOSIS — E1151 Type 2 diabetes mellitus with diabetic peripheral angiopathy without gangrene: Secondary | ICD-10-CM | POA: Diagnosis not present

## 2015-05-03 DIAGNOSIS — J449 Chronic obstructive pulmonary disease, unspecified: Secondary | ICD-10-CM | POA: Diagnosis not present

## 2015-05-03 DIAGNOSIS — Z9181 History of falling: Secondary | ICD-10-CM | POA: Diagnosis not present

## 2015-05-05 DIAGNOSIS — S42202D Unspecified fracture of upper end of left humerus, subsequent encounter for fracture with routine healing: Secondary | ICD-10-CM | POA: Diagnosis not present

## 2015-05-05 DIAGNOSIS — E1142 Type 2 diabetes mellitus with diabetic polyneuropathy: Secondary | ICD-10-CM | POA: Diagnosis not present

## 2015-05-05 DIAGNOSIS — I739 Peripheral vascular disease, unspecified: Secondary | ICD-10-CM | POA: Diagnosis not present

## 2015-05-05 DIAGNOSIS — J449 Chronic obstructive pulmonary disease, unspecified: Secondary | ICD-10-CM | POA: Diagnosis not present

## 2015-05-05 DIAGNOSIS — Z794 Long term (current) use of insulin: Secondary | ICD-10-CM | POA: Diagnosis not present

## 2015-05-05 DIAGNOSIS — M109 Gout, unspecified: Secondary | ICD-10-CM | POA: Diagnosis not present

## 2015-05-05 DIAGNOSIS — E063 Autoimmune thyroiditis: Secondary | ICD-10-CM | POA: Diagnosis not present

## 2015-05-05 DIAGNOSIS — S42295D Other nondisplaced fracture of upper end of left humerus, subsequent encounter for fracture with routine healing: Secondary | ICD-10-CM | POA: Diagnosis not present

## 2015-05-05 DIAGNOSIS — Z9181 History of falling: Secondary | ICD-10-CM | POA: Diagnosis not present

## 2015-05-05 DIAGNOSIS — E1151 Type 2 diabetes mellitus with diabetic peripheral angiopathy without gangrene: Secondary | ICD-10-CM | POA: Diagnosis not present

## 2015-05-05 DIAGNOSIS — E039 Hypothyroidism, unspecified: Secondary | ICD-10-CM | POA: Diagnosis not present

## 2015-05-05 DIAGNOSIS — I2510 Atherosclerotic heart disease of native coronary artery without angina pectoris: Secondary | ICD-10-CM | POA: Diagnosis not present

## 2015-05-05 DIAGNOSIS — R609 Edema, unspecified: Secondary | ICD-10-CM | POA: Diagnosis not present

## 2015-05-06 DIAGNOSIS — E1151 Type 2 diabetes mellitus with diabetic peripheral angiopathy without gangrene: Secondary | ICD-10-CM | POA: Diagnosis not present

## 2015-05-06 DIAGNOSIS — Z9181 History of falling: Secondary | ICD-10-CM | POA: Diagnosis not present

## 2015-05-06 DIAGNOSIS — S42202D Unspecified fracture of upper end of left humerus, subsequent encounter for fracture with routine healing: Secondary | ICD-10-CM | POA: Diagnosis not present

## 2015-05-06 DIAGNOSIS — I2510 Atherosclerotic heart disease of native coronary artery without angina pectoris: Secondary | ICD-10-CM | POA: Diagnosis not present

## 2015-05-06 DIAGNOSIS — J449 Chronic obstructive pulmonary disease, unspecified: Secondary | ICD-10-CM | POA: Diagnosis not present

## 2015-05-06 DIAGNOSIS — M109 Gout, unspecified: Secondary | ICD-10-CM | POA: Diagnosis not present

## 2015-05-07 DIAGNOSIS — M109 Gout, unspecified: Secondary | ICD-10-CM | POA: Diagnosis not present

## 2015-05-07 DIAGNOSIS — J449 Chronic obstructive pulmonary disease, unspecified: Secondary | ICD-10-CM | POA: Diagnosis not present

## 2015-05-07 DIAGNOSIS — E1151 Type 2 diabetes mellitus with diabetic peripheral angiopathy without gangrene: Secondary | ICD-10-CM | POA: Diagnosis not present

## 2015-05-07 DIAGNOSIS — S42202D Unspecified fracture of upper end of left humerus, subsequent encounter for fracture with routine healing: Secondary | ICD-10-CM | POA: Diagnosis not present

## 2015-05-07 DIAGNOSIS — Z9181 History of falling: Secondary | ICD-10-CM | POA: Diagnosis not present

## 2015-05-07 DIAGNOSIS — I2510 Atherosclerotic heart disease of native coronary artery without angina pectoris: Secondary | ICD-10-CM | POA: Diagnosis not present

## 2015-05-11 ENCOUNTER — Encounter: Payer: Self-pay | Admitting: Podiatry

## 2015-05-11 ENCOUNTER — Ambulatory Visit (INDEPENDENT_AMBULATORY_CARE_PROVIDER_SITE_OTHER): Payer: Medicare Other

## 2015-05-11 ENCOUNTER — Ambulatory Visit (INDEPENDENT_AMBULATORY_CARE_PROVIDER_SITE_OTHER): Payer: Medicare Other | Admitting: Podiatry

## 2015-05-11 VITALS — BP 116/74 | HR 69 | Temp 98.6°F | Resp 12

## 2015-05-11 DIAGNOSIS — L02619 Cutaneous abscess of unspecified foot: Secondary | ICD-10-CM | POA: Diagnosis not present

## 2015-05-11 DIAGNOSIS — B351 Tinea unguium: Secondary | ICD-10-CM | POA: Diagnosis not present

## 2015-05-11 DIAGNOSIS — R52 Pain, unspecified: Secondary | ICD-10-CM

## 2015-05-11 DIAGNOSIS — S82202D Unspecified fracture of shaft of left tibia, subsequent encounter for closed fracture with routine healing: Secondary | ICD-10-CM | POA: Diagnosis not present

## 2015-05-11 DIAGNOSIS — I2510 Atherosclerotic heart disease of native coronary artery without angina pectoris: Secondary | ICD-10-CM | POA: Diagnosis not present

## 2015-05-11 DIAGNOSIS — L03119 Cellulitis of unspecified part of limb: Secondary | ICD-10-CM | POA: Diagnosis not present

## 2015-05-11 DIAGNOSIS — M79675 Pain in left toe(s): Secondary | ICD-10-CM

## 2015-05-11 DIAGNOSIS — R2681 Unsteadiness on feet: Secondary | ICD-10-CM | POA: Diagnosis not present

## 2015-05-11 DIAGNOSIS — M79674 Pain in right toe(s): Secondary | ICD-10-CM | POA: Diagnosis not present

## 2015-05-11 DIAGNOSIS — Z9181 History of falling: Secondary | ICD-10-CM | POA: Diagnosis not present

## 2015-05-11 DIAGNOSIS — J449 Chronic obstructive pulmonary disease, unspecified: Secondary | ICD-10-CM | POA: Diagnosis not present

## 2015-05-11 DIAGNOSIS — E1151 Type 2 diabetes mellitus with diabetic peripheral angiopathy without gangrene: Secondary | ICD-10-CM | POA: Diagnosis not present

## 2015-05-11 NOTE — Progress Notes (Signed)
Subjective:    Patient ID: Ernest Aus., male    DOB: 09-Sep-1931, 82 y.o.   MRN: [id number]  HPI This patient presents today for a scheduled visit complaining of uncomfortable toenails walking wearing shoes and request toenail debridement The patient's son is present in the treatment room today who helps give more detailed history of the chief complaint  Also patient is requesting evaluation for redness, burning, a painful feet/ankles bilaterally for approximately 2 months with gradual reduction symptoms over the past 1 month. t ankles over the past  month. The patient says that the redness and swelling has significantly improved. Over the past 2 months. He describes taking clindamycin orally for 10 days in the past month by his primary care physician which has significantly reduced the symptoms of the the swelling and redness. He also has taken furosemide. The swelling and redness seems to rotate between the seems to rotate from one extremity to the other with today's redness more prominent in the dorsal left ankle and foot. He also says that he was taking gabapentin but discontinued it recently is unsure as to why and now he complains of great deal of discomfort at night preventing him from falling asleep easily and staying asleep.  Patient is a diabetic with a known history of peripheral arterial disease. Patient was last evaluated for peripheral arterial disease on 04/07/2015 by Dr. Gwenlyn Found  Review of Systems  Cardiovascular: Positive for leg swelling.  Musculoskeletal: Positive for gait problem.  Skin: Positive for color change.       Objective:   Physical Exam  Patient appears orientated 3 Oral temperature 98.6 BP 116/74 Pulse 69 Respiration 12  Vascular: Bilateral lower extremity edema Tenderness palpation bilaterally DP and PT pulses 0/4 bilaterally Capillary reflex leg bilaterally  Neurological: Sensation to 10 g monofilament wire intact 4/5 right 1/5  left Vibratory sensation reactive right nonreactive left Ankle reflexes equal reactive bilaterally   Dermatological: No open skin lesions bilaterally Dorsal aspect of left foot has erythema without warmth Right ankle appears more edematous than the left The toenails are brittle hypertrophic, deformed and tender to direct palpation 6-10  Musculoskeletal: HAV right Hammertoe second right   review of chart notes Dr. Gwenlyn Found dated 04/07/2015 Peripheral arterial disease Coronary artery disease essential hypertension hyperlipidemia  X-ray examination weightbearing left foot dated 05/11/2015  Intact bony structure without fracture and/or dislocation Decreased bone density in all views HAV left with decreased joint space in first MPJ Decreased joint spaces midfoot  Radiographic impression: No acute bony abnormality noted in the left foot on the x-ray of 05/11/2015   X-ray weightbearing right foot dated 05/11/2015  Intact bony structure without a fracture and/or dislocation HAV deformity with decreased joint space right first MPJ  Radiographic impression: No acute bony abnormality noted in weightbearing x-ray dated 05/11/2015  X-ray weightbearing left ankle dated 05/11/2015  Intact bony structure without fracture and/or dislocation Ankle mortise joint space adequate Inferior calcaneal spur  Radiographic impression: No acute bony abnormality noted in the left ankle dated 05/11/2015   X-ray examination weightbearing right ankle dated 05/11/2015  Intact bony structure without fracture and/or dislocation Ankle mortise joint space adequate Inferior calcaneal spur  Radiographic impression: No acute bony abnormality noted in weightbearing ankle dated 05/11/2015      Assessment & Plan:   Assessment: Resolving or reducing cellulitis lower extremity bilaterally Symptomatic onychomycoses 6-10 Diabetic peripheral neuropathy  Plan: Today I reviewed the results of examination  and x-rays with patient and patient's  son. At this time because the symptoms seem to be gradually improving and the erythema and edema seems to rotate between the right and left extremities I did not prescribe any antibiotics at this time. I want patient to return within 7 days to evaluate this problem again. Also, patient advised that if he develops any sudden pain, redness, swelling, warmth present to emergency department or contact our office Also, I recommended that patient restart his existing prescription for gabapentin  The toenails 6-10 were debrided mechanically and electronically without any bleeding  Reappoint 7 days

## 2015-05-11 NOTE — Patient Instructions (Signed)
Today your examination revealed 1 month history of improving swelling and redness in your right and left feet that have been treated with 10 days of clindamycin. Also, you were complaining of difficulty falling asleep and have recently discontinued gabapentin. As his symptoms seem to be improving wear going to observe the swelling redness and reevaluate 7 days. If you develop any sudden increase in pain, swelling, fever present to ED  Also, if you have your existing prescription for gabapentin refill it and take it as it was previously described  Diabetes and Foot Care Diabetes may cause you to have problems because of poor blood supply (circulation) to your feet and legs. This may cause the skin on your feet to become thinner, break easier, and heal more slowly. Your skin may become dry, and the skin may peel and crack. You may also have nerve damage in your legs and feet causing decreased feeling in them. You may not notice minor injuries to your feet that could lead to infections or more serious problems. Taking care of your feet is one of the most important things you can do for yourself.  HOME CARE INSTRUCTIONS  Wear shoes at all times, even in the house. Do not go barefoot. Bare feet are easily injured.  Check your feet daily for blisters, cuts, and redness. If you cannot see the bottom of your feet, use a mirror or ask someone for help.  Wash your feet with warm water (do not use hot water) and mild soap. Then pat your feet and the areas between your toes until they are completely dry. Do not soak your feet as this can dry your skin.  Apply a moisturizing lotion or petroleum jelly (that does not contain alcohol and is unscented) to the skin on your feet and to dry, brittle toenails. Do not apply lotion between your toes.  Trim your toenails straight across. Do not dig under them or around the cuticle. File the edges of your nails with an emery board or nail file.  Do not cut corns or  calluses or try to remove them with medicine.  Wear clean socks or stockings every day. Make sure they are not too tight. Do not wear knee-high stockings since they may decrease blood flow to your legs.  Wear shoes that fit properly and have enough cushioning. To break in new shoes, wear them for just a few hours a day. This prevents you from injuring your feet. Always look in your shoes before you put them on to be sure there are no objects inside.  Do not cross your legs. This may decrease the blood flow to your feet.  If you find a minor scrape, cut, or break in the skin on your feet, keep it and the skin around it clean and dry. These areas may be cleansed with mild soap and water. Do not cleanse the area with peroxide, alcohol, or iodine.  When you remove an adhesive bandage, be sure not to damage the skin around it.  If you have a wound, look at it several times a day to make sure it is healing.  Do not use heating pads or hot water bottles. They may burn your skin. If you have lost feeling in your feet or legs, you may not know it is happening until it is too late.  Make sure your health care provider performs a complete foot exam at least annually or more often if you have foot problems. Report any cuts, sores,  or bruises to your health care provider immediately. SEEK MEDICAL CARE IF:   You have an injury that is not healing.  You have cuts or breaks in the skin.  You have an ingrown nail.  You notice redness on your legs or feet.  You feel burning or tingling in your legs or feet.  You have pain or cramps in your legs and feet.  Your legs or feet are numb.  Your feet always feel cold. SEEK IMMEDIATE MEDICAL CARE IF:   There is increasing redness, swelling, or pain in or around a wound.  There is a red line that goes up your leg.  Pus is coming from a wound.  You develop a fever or as directed by your health care provider.  You notice a bad smell coming from an  ulcer or wound.   This information is not intended to replace advice given to you by your health care provider. Make sure you discuss any questions you have with your health care provider.   Document Released: 12/17/1999 Document Revised: 08/21/2012 Document Reviewed: 05/28/2012 Elsevier Interactive Patient Education [employer].

## 2015-05-12 DIAGNOSIS — I2510 Atherosclerotic heart disease of native coronary artery without angina pectoris: Secondary | ICD-10-CM | POA: Diagnosis not present

## 2015-05-12 DIAGNOSIS — S82202D Unspecified fracture of shaft of left tibia, subsequent encounter for closed fracture with routine healing: Secondary | ICD-10-CM | POA: Diagnosis not present

## 2015-05-12 DIAGNOSIS — R2681 Unsteadiness on feet: Secondary | ICD-10-CM | POA: Diagnosis not present

## 2015-05-12 DIAGNOSIS — J449 Chronic obstructive pulmonary disease, unspecified: Secondary | ICD-10-CM | POA: Diagnosis not present

## 2015-05-12 DIAGNOSIS — E1151 Type 2 diabetes mellitus with diabetic peripheral angiopathy without gangrene: Secondary | ICD-10-CM | POA: Diagnosis not present

## 2015-05-12 DIAGNOSIS — Z9181 History of falling: Secondary | ICD-10-CM | POA: Diagnosis not present

## 2015-05-14 DIAGNOSIS — R2681 Unsteadiness on feet: Secondary | ICD-10-CM | POA: Diagnosis not present

## 2015-05-14 DIAGNOSIS — I2510 Atherosclerotic heart disease of native coronary artery without angina pectoris: Secondary | ICD-10-CM | POA: Diagnosis not present

## 2015-05-14 DIAGNOSIS — E1151 Type 2 diabetes mellitus with diabetic peripheral angiopathy without gangrene: Secondary | ICD-10-CM | POA: Diagnosis not present

## 2015-05-14 DIAGNOSIS — S82202D Unspecified fracture of shaft of left tibia, subsequent encounter for closed fracture with routine healing: Secondary | ICD-10-CM | POA: Diagnosis not present

## 2015-05-14 DIAGNOSIS — J449 Chronic obstructive pulmonary disease, unspecified: Secondary | ICD-10-CM | POA: Diagnosis not present

## 2015-05-14 DIAGNOSIS — Z9181 History of falling: Secondary | ICD-10-CM | POA: Diagnosis not present

## 2015-05-17 DIAGNOSIS — Z9181 History of falling: Secondary | ICD-10-CM | POA: Diagnosis not present

## 2015-05-17 DIAGNOSIS — E1151 Type 2 diabetes mellitus with diabetic peripheral angiopathy without gangrene: Secondary | ICD-10-CM | POA: Diagnosis not present

## 2015-05-17 DIAGNOSIS — I2510 Atherosclerotic heart disease of native coronary artery without angina pectoris: Secondary | ICD-10-CM | POA: Diagnosis not present

## 2015-05-17 DIAGNOSIS — S82202D Unspecified fracture of shaft of left tibia, subsequent encounter for closed fracture with routine healing: Secondary | ICD-10-CM | POA: Diagnosis not present

## 2015-05-17 DIAGNOSIS — J449 Chronic obstructive pulmonary disease, unspecified: Secondary | ICD-10-CM | POA: Diagnosis not present

## 2015-05-17 DIAGNOSIS — R2681 Unsteadiness on feet: Secondary | ICD-10-CM | POA: Diagnosis not present

## 2015-05-18 ENCOUNTER — Ambulatory Visit (INDEPENDENT_AMBULATORY_CARE_PROVIDER_SITE_OTHER): Payer: Medicare Other | Admitting: Podiatry

## 2015-05-18 ENCOUNTER — Encounter: Payer: Self-pay | Admitting: Podiatry

## 2015-05-18 VITALS — BP 129/76 | HR 69 | Temp 96.5°F | Resp 12

## 2015-05-18 DIAGNOSIS — Z9181 History of falling: Secondary | ICD-10-CM | POA: Diagnosis not present

## 2015-05-18 DIAGNOSIS — L02619 Cutaneous abscess of unspecified foot: Secondary | ICD-10-CM | POA: Diagnosis not present

## 2015-05-18 DIAGNOSIS — I2510 Atherosclerotic heart disease of native coronary artery without angina pectoris: Secondary | ICD-10-CM | POA: Diagnosis not present

## 2015-05-18 DIAGNOSIS — E1151 Type 2 diabetes mellitus with diabetic peripheral angiopathy without gangrene: Secondary | ICD-10-CM | POA: Diagnosis not present

## 2015-05-18 DIAGNOSIS — L03119 Cellulitis of unspecified part of limb: Secondary | ICD-10-CM

## 2015-05-18 DIAGNOSIS — R2681 Unsteadiness on feet: Secondary | ICD-10-CM | POA: Diagnosis not present

## 2015-05-18 DIAGNOSIS — S82202D Unspecified fracture of shaft of left tibia, subsequent encounter for closed fracture with routine healing: Secondary | ICD-10-CM | POA: Diagnosis not present

## 2015-05-18 DIAGNOSIS — J449 Chronic obstructive pulmonary disease, unspecified: Secondary | ICD-10-CM | POA: Diagnosis not present

## 2015-05-18 NOTE — Progress Notes (Signed)
Subjective:    Patient ID: Ernest Aus., male    DOB: 27-May-1931, 82 y.o.   MRN: [id number]  HPI This patient presents today for a scheduled visit complaining of uncomfortable toenails walking wearing shoes and request toenail debridement The patient's son is present in the treatment room today who helps give more detailed history of the chief complaint  Also patient is requesting evaluation for redness, burning, a painful feet/ankles bilaterally for approximately 2 months with gradual reduction symptoms over the past 1 month. t ankles over the past month. The patient says that the redness and swelling has significantly improved. Over the past 2 months. He describes taking clindamycin orally for 10 days Completing the last dose on 03/24/2015, by his primary care physician which has significantly reduced the symptoms of the the swelling and redness. He also has taken furosemide. The swelling and redness seems to rotate between the seems to rotate from one extremity to the other with today's redness more prominent in the dorsal left ankle and foot. He also says that he was taking gabapentin but discontinued it recently is unsure as to why and now he complains of great deal of discomfort at night preventing him from falling asleep easily and staying asleep.  Patient is a diabetic with a known history of peripheral arterial disease. Patient was last evaluated for peripheral arterial disease on 04/07/2015 by Dr. Gwenlyn Clark On the initial visit of 05/11/2015 I deferred on adding any further oral antibiotics of the symptoms were stable and not worsening over time   Review of Systems  Cardiovascular: Positive for leg swelling.  Skin: Positive for color change.       Objective:   Physical Exam  BP 129/76 Pulse 69 Respiration 12 Temperature 96.5 Fahrenheit  Vascular: Bilateral lower extremity edema Tenderness palpation bilaterally DP and PT pulses 0/4 bilaterally Capillary reflex leg  bilaterally  Neurological: Sensation to 10 g monofilament wire intact 4/5 right 1/5 left Vibratory sensation reactive right nonreactive left Ankle reflexes equal reactive bilaterally   Dermatological: No open skin lesions bilaterally Dorsal aspect of left foot has erythema without warmth Right ankle appears more edematous than the left The toenails are brittle hypertrophic, deformed and tender to direct palpation 6-10  Musculoskeletal: HAV right Hammertoe second right  review of chart notes Dr. Gwenlyn Clark dated 04/07/2015 Peripheral arterial disease Coronary artery disease essential hypertension hyperlipidemia  X-ray examination weightbearing left foot dated 05/11/2015  Intact bony structure without fracture and/or dislocation Decreased bone density in all views HAV left with decreased joint space in first MPJ Decreased joint spaces midfoot  Radiographic impression: No acute bony abnormality noted in the left foot on the x-ray of 05/11/2015   X-ray weightbearing right foot dated 05/11/2015  Intact bony structure without a fracture and/or dislocation HAV deformity with decreased joint space right first MPJ  Radiographic impression: No acute bony abnormality noted in weightbearing x-ray dated 05/11/2015  X-ray weightbearing left ankle dated 05/11/2015  Intact bony structure without fracture and/or dislocation Ankle mortise joint space adequate Inferior calcaneal spur  Radiographic impression: No acute bony abnormality noted in the left ankle dated 05/11/2015   X-ray examination weightbearing right ankle dated 05/11/2015  Intact bony structure without fracture and/or dislocation Ankle mortise joint space adequate Inferior calcaneal spur  Radiographic impression: No acute bony abnormality noted in weightbearing ankle dated 05/11/2015          Assessment & Plan:   Assessment: Resolving or reducing cellulitis lower extremity bilaterallyThere is no progression  in the erythema, edema as noted in the baseline examination of 05/11/2015 Bilateral peripheral edema Diabetic peripheral neuropathy  Plan: At this time his symptoms are seen to be stabilized out any progression no further treatment recommended at this time. I recommended to patient and his son to observe the area for any sudden change in appearance  Reappoint 3 months for nail debridement

## 2015-05-18 NOTE — Patient Instructions (Signed)
Today your foot examination has not changed in appearance and general findings from the visit of 05/11/2015. It appears that the cellulitis on the left foot has resolved continue to observe any sudden increase in pain, swelling, redness and presented for evaluation if you notice the symptoms Return as needed for follow-up in regards to this problem Reschedule 3 months for debridement of mycotic toenails    Diabetes and Foot Care Diabetes may cause you to have problems because of poor blood supply (circulation) to your feet and legs. This may cause the skin on your feet to become thinner, break easier, and heal more slowly. Your skin may become dry, and the skin may peel and crack. You may also have nerve damage in your legs and feet causing decreased feeling in them. You may not notice minor injuries to your feet that could lead to infections or more serious problems. Taking care of your feet is one of the most important things you can do for yourself.  HOME CARE INSTRUCTIONS  Wear shoes at all times, even in the house. Do not go barefoot. Bare feet are easily injured.  Check your feet daily for blisters, cuts, and redness. If you cannot see the bottom of your feet, use a mirror or ask someone for help.  Wash your feet with warm water (do not use hot water) and mild soap. Then pat your feet and the areas between your toes until they are completely dry. Do not soak your feet as this can dry your skin.  Apply a moisturizing lotion or petroleum jelly (that does not contain alcohol and is unscented) to the skin on your feet and to dry, brittle toenails. Do not apply lotion between your toes.  Trim your toenails straight across. Do not dig under them or around the cuticle. File the edges of your nails with an emery board or nail file.  Do not cut corns or calluses or try to remove them with medicine.  Wear clean socks or stockings every day. Make sure they are not too tight. Do not wear knee-high  stockings since they may decrease blood flow to your legs.  Wear shoes that fit properly and have enough cushioning. To break in new shoes, wear them for just a few hours a day. This prevents you from injuring your feet. Always look in your shoes before you put them on to be sure there are no objects inside.  Do not cross your legs. This may decrease the blood flow to your feet.  If you find a minor scrape, cut, or break in the skin on your feet, keep it and the skin around it clean and dry. These areas may be cleansed with mild soap and water. Do not cleanse the area with peroxide, alcohol, or iodine.  When you remove an adhesive bandage, be sure not to damage the skin around it.  If you have a wound, look at it several times a day to make sure it is healing.  Do not use heating pads or hot water bottles. They may burn your skin. If you have lost feeling in your feet or legs, you may not know it is happening until it is too late.  Make sure your health care provider performs a complete foot exam at least annually or more often if you have foot problems. Report any cuts, sores, or bruises to your health care provider immediately. SEEK MEDICAL CARE IF:   You have an injury that is not healing.  You  have cuts or breaks in the skin.  You have an ingrown nail.  You notice redness on your legs or feet.  You feel burning or tingling in your legs or feet.  You have pain or cramps in your legs and feet.  Your legs or feet are numb.  Your feet always feel cold. SEEK IMMEDIATE MEDICAL CARE IF:   There is increasing redness, swelling, or pain in or around a wound.  There is a red line that goes up your leg.  Pus is coming from a wound.  You develop a fever or as directed by your health care provider.  You notice a bad smell coming from an ulcer or wound.   This information is not intended to replace advice given to you by your health care provider. Make sure you discuss any questions  you have with your health care provider.   Document Released: 12/17/1999 Document Revised: 08/21/2012 Document Reviewed: 05/28/2012 Elsevier Interactive Patient Education [employer].

## 2015-05-20 DIAGNOSIS — S82202D Unspecified fracture of shaft of left tibia, subsequent encounter for closed fracture with routine healing: Secondary | ICD-10-CM | POA: Diagnosis not present

## 2015-05-20 DIAGNOSIS — R2681 Unsteadiness on feet: Secondary | ICD-10-CM | POA: Diagnosis not present

## 2015-05-20 DIAGNOSIS — E1151 Type 2 diabetes mellitus with diabetic peripheral angiopathy without gangrene: Secondary | ICD-10-CM | POA: Diagnosis not present

## 2015-05-20 DIAGNOSIS — J449 Chronic obstructive pulmonary disease, unspecified: Secondary | ICD-10-CM | POA: Diagnosis not present

## 2015-05-20 DIAGNOSIS — I2510 Atherosclerotic heart disease of native coronary artery without angina pectoris: Secondary | ICD-10-CM | POA: Diagnosis not present

## 2015-05-20 DIAGNOSIS — Z9181 History of falling: Secondary | ICD-10-CM | POA: Diagnosis not present

## 2015-05-24 DIAGNOSIS — J449 Chronic obstructive pulmonary disease, unspecified: Secondary | ICD-10-CM | POA: Diagnosis not present

## 2015-05-24 DIAGNOSIS — R2681 Unsteadiness on feet: Secondary | ICD-10-CM | POA: Diagnosis not present

## 2015-05-24 DIAGNOSIS — E1151 Type 2 diabetes mellitus with diabetic peripheral angiopathy without gangrene: Secondary | ICD-10-CM | POA: Diagnosis not present

## 2015-05-24 DIAGNOSIS — I2510 Atherosclerotic heart disease of native coronary artery without angina pectoris: Secondary | ICD-10-CM | POA: Diagnosis not present

## 2015-05-24 DIAGNOSIS — Z9181 History of falling: Secondary | ICD-10-CM | POA: Diagnosis not present

## 2015-05-24 DIAGNOSIS — S82202D Unspecified fracture of shaft of left tibia, subsequent encounter for closed fracture with routine healing: Secondary | ICD-10-CM | POA: Diagnosis not present

## 2015-05-25 DIAGNOSIS — J449 Chronic obstructive pulmonary disease, unspecified: Secondary | ICD-10-CM | POA: Diagnosis not present

## 2015-05-25 DIAGNOSIS — S82202D Unspecified fracture of shaft of left tibia, subsequent encounter for closed fracture with routine healing: Secondary | ICD-10-CM | POA: Diagnosis not present

## 2015-05-25 DIAGNOSIS — I2510 Atherosclerotic heart disease of native coronary artery without angina pectoris: Secondary | ICD-10-CM | POA: Diagnosis not present

## 2015-05-25 DIAGNOSIS — E1151 Type 2 diabetes mellitus with diabetic peripheral angiopathy without gangrene: Secondary | ICD-10-CM | POA: Diagnosis not present

## 2015-05-25 DIAGNOSIS — Z9181 History of falling: Secondary | ICD-10-CM | POA: Diagnosis not present

## 2015-05-25 DIAGNOSIS — R2681 Unsteadiness on feet: Secondary | ICD-10-CM | POA: Diagnosis not present

## 2015-05-26 ENCOUNTER — Ambulatory Visit: Payer: Medicare Other | Admitting: Podiatry

## 2015-05-26 DIAGNOSIS — I2510 Atherosclerotic heart disease of native coronary artery without angina pectoris: Secondary | ICD-10-CM | POA: Diagnosis not present

## 2015-05-26 DIAGNOSIS — R2681 Unsteadiness on feet: Secondary | ICD-10-CM | POA: Diagnosis not present

## 2015-05-26 DIAGNOSIS — J449 Chronic obstructive pulmonary disease, unspecified: Secondary | ICD-10-CM | POA: Diagnosis not present

## 2015-05-26 DIAGNOSIS — S82202D Unspecified fracture of shaft of left tibia, subsequent encounter for closed fracture with routine healing: Secondary | ICD-10-CM | POA: Diagnosis not present

## 2015-05-26 DIAGNOSIS — E1151 Type 2 diabetes mellitus with diabetic peripheral angiopathy without gangrene: Secondary | ICD-10-CM | POA: Diagnosis not present

## 2015-05-26 DIAGNOSIS — Z9181 History of falling: Secondary | ICD-10-CM | POA: Diagnosis not present

## 2015-05-27 DIAGNOSIS — Z9181 History of falling: Secondary | ICD-10-CM | POA: Diagnosis not present

## 2015-05-27 DIAGNOSIS — E1151 Type 2 diabetes mellitus with diabetic peripheral angiopathy without gangrene: Secondary | ICD-10-CM | POA: Diagnosis not present

## 2015-05-27 DIAGNOSIS — J449 Chronic obstructive pulmonary disease, unspecified: Secondary | ICD-10-CM | POA: Diagnosis not present

## 2015-05-27 DIAGNOSIS — S82202D Unspecified fracture of shaft of left tibia, subsequent encounter for closed fracture with routine healing: Secondary | ICD-10-CM | POA: Diagnosis not present

## 2015-05-27 DIAGNOSIS — R2681 Unsteadiness on feet: Secondary | ICD-10-CM | POA: Diagnosis not present

## 2015-05-27 DIAGNOSIS — I2510 Atherosclerotic heart disease of native coronary artery without angina pectoris: Secondary | ICD-10-CM | POA: Diagnosis not present

## 2015-06-01 DIAGNOSIS — S82202D Unspecified fracture of shaft of left tibia, subsequent encounter for closed fracture with routine healing: Secondary | ICD-10-CM | POA: Diagnosis not present

## 2015-06-01 DIAGNOSIS — J449 Chronic obstructive pulmonary disease, unspecified: Secondary | ICD-10-CM | POA: Diagnosis not present

## 2015-06-01 DIAGNOSIS — Z9181 History of falling: Secondary | ICD-10-CM | POA: Diagnosis not present

## 2015-06-01 DIAGNOSIS — R2681 Unsteadiness on feet: Secondary | ICD-10-CM | POA: Diagnosis not present

## 2015-06-01 DIAGNOSIS — I2510 Atherosclerotic heart disease of native coronary artery without angina pectoris: Secondary | ICD-10-CM | POA: Diagnosis not present

## 2015-06-01 DIAGNOSIS — E1151 Type 2 diabetes mellitus with diabetic peripheral angiopathy without gangrene: Secondary | ICD-10-CM | POA: Diagnosis not present

## 2015-06-03 DIAGNOSIS — E1151 Type 2 diabetes mellitus with diabetic peripheral angiopathy without gangrene: Secondary | ICD-10-CM | POA: Diagnosis not present

## 2015-06-03 DIAGNOSIS — Z9181 History of falling: Secondary | ICD-10-CM | POA: Diagnosis not present

## 2015-06-03 DIAGNOSIS — J449 Chronic obstructive pulmonary disease, unspecified: Secondary | ICD-10-CM | POA: Diagnosis not present

## 2015-06-03 DIAGNOSIS — I2510 Atherosclerotic heart disease of native coronary artery without angina pectoris: Secondary | ICD-10-CM | POA: Diagnosis not present

## 2015-06-03 DIAGNOSIS — R2681 Unsteadiness on feet: Secondary | ICD-10-CM | POA: Diagnosis not present

## 2015-06-03 DIAGNOSIS — S82202D Unspecified fracture of shaft of left tibia, subsequent encounter for closed fracture with routine healing: Secondary | ICD-10-CM | POA: Diagnosis not present

## 2015-06-07 DIAGNOSIS — S82202D Unspecified fracture of shaft of left tibia, subsequent encounter for closed fracture with routine healing: Secondary | ICD-10-CM | POA: Diagnosis not present

## 2015-06-07 DIAGNOSIS — R2681 Unsteadiness on feet: Secondary | ICD-10-CM | POA: Diagnosis not present

## 2015-06-07 DIAGNOSIS — J449 Chronic obstructive pulmonary disease, unspecified: Secondary | ICD-10-CM | POA: Diagnosis not present

## 2015-06-07 DIAGNOSIS — I2510 Atherosclerotic heart disease of native coronary artery without angina pectoris: Secondary | ICD-10-CM | POA: Diagnosis not present

## 2015-06-07 DIAGNOSIS — Z9181 History of falling: Secondary | ICD-10-CM | POA: Diagnosis not present

## 2015-06-07 DIAGNOSIS — E1151 Type 2 diabetes mellitus with diabetic peripheral angiopathy without gangrene: Secondary | ICD-10-CM | POA: Diagnosis not present

## 2015-06-09 DIAGNOSIS — I2510 Atherosclerotic heart disease of native coronary artery without angina pectoris: Secondary | ICD-10-CM | POA: Diagnosis not present

## 2015-06-09 DIAGNOSIS — R2681 Unsteadiness on feet: Secondary | ICD-10-CM | POA: Diagnosis not present

## 2015-06-09 DIAGNOSIS — E1151 Type 2 diabetes mellitus with diabetic peripheral angiopathy without gangrene: Secondary | ICD-10-CM | POA: Diagnosis not present

## 2015-06-09 DIAGNOSIS — Z9181 History of falling: Secondary | ICD-10-CM | POA: Diagnosis not present

## 2015-06-09 DIAGNOSIS — S82202D Unspecified fracture of shaft of left tibia, subsequent encounter for closed fracture with routine healing: Secondary | ICD-10-CM | POA: Diagnosis not present

## 2015-06-09 DIAGNOSIS — J449 Chronic obstructive pulmonary disease, unspecified: Secondary | ICD-10-CM | POA: Diagnosis not present

## 2015-06-21 DIAGNOSIS — E1142 Type 2 diabetes mellitus with diabetic polyneuropathy: Secondary | ICD-10-CM | POA: Diagnosis not present

## 2015-06-21 DIAGNOSIS — Z794 Long term (current) use of insulin: Secondary | ICD-10-CM | POA: Diagnosis not present

## 2015-06-21 DIAGNOSIS — Z7984 Long term (current) use of oral hypoglycemic drugs: Secondary | ICD-10-CM | POA: Diagnosis not present

## 2015-06-21 DIAGNOSIS — E063 Autoimmune thyroiditis: Secondary | ICD-10-CM | POA: Diagnosis not present

## 2015-06-21 DIAGNOSIS — E785 Hyperlipidemia, unspecified: Secondary | ICD-10-CM | POA: Diagnosis not present

## 2015-06-21 DIAGNOSIS — I10 Essential (primary) hypertension: Secondary | ICD-10-CM | POA: Diagnosis not present

## 2015-06-23 DIAGNOSIS — I739 Peripheral vascular disease, unspecified: Secondary | ICD-10-CM | POA: Diagnosis not present

## 2015-06-23 DIAGNOSIS — R609 Edema, unspecified: Secondary | ICD-10-CM | POA: Diagnosis not present

## 2015-06-23 DIAGNOSIS — Z794 Long term (current) use of insulin: Secondary | ICD-10-CM | POA: Diagnosis not present

## 2015-06-23 DIAGNOSIS — E1142 Type 2 diabetes mellitus with diabetic polyneuropathy: Secondary | ICD-10-CM | POA: Diagnosis not present

## 2015-06-23 DIAGNOSIS — E063 Autoimmune thyroiditis: Secondary | ICD-10-CM | POA: Diagnosis not present

## 2015-06-23 DIAGNOSIS — E039 Hypothyroidism, unspecified: Secondary | ICD-10-CM | POA: Diagnosis not present

## 2015-08-17 ENCOUNTER — Ambulatory Visit (INDEPENDENT_AMBULATORY_CARE_PROVIDER_SITE_OTHER): Payer: Medicare Other | Admitting: Podiatry

## 2015-08-17 ENCOUNTER — Encounter: Payer: Self-pay | Admitting: Podiatry

## 2015-08-17 DIAGNOSIS — B351 Tinea unguium: Secondary | ICD-10-CM | POA: Diagnosis not present

## 2015-08-17 DIAGNOSIS — M79675 Pain in left toe(s): Secondary | ICD-10-CM | POA: Diagnosis not present

## 2015-08-17 DIAGNOSIS — M79674 Pain in right toe(s): Secondary | ICD-10-CM

## 2015-08-17 NOTE — Progress Notes (Signed)
Patient ID: Ernest Clark., male   DOB: [birth year], 82 y.o.   MRN: [id number]   Subjective: This patient presents today complaining of elongated and thickened toenails and requests toenail debridement. He states that the nails are cough and walking wearing shoes Patient is a diabetic with a known history of peripheral arterial disease that has been evaluated by Dr. Gwenlyn Found  Objective Vascular: Bilateral lower extremity edema Tenderness palpation bilaterally DP and PT pulses 0/4 bilaterally Capillary reflex leg bilaterally  Neurological: Sensation to 10 g monofilament wire intact 4/5 right 1/5 left Vibratory sensation reactive right nonreactive left Ankle reflexes equal reactive bilaterally   Dermatological: No open skin lesions bilaterally Right ankle appears more edematous than the left The toenails are brittle hypertrophic, deformed and tender to direct palpation 6-10  Musculoskeletal: HAV right Hammertoe second right  Assessment: Peripheral neuropathy Peripheral vascular disease Diabetic Symptomatic onychomycoses 6-10  Plan: Debridement of toenails 6-10 mechanically and electrically without any bleeding  Reappoint 3 months

## 2015-08-17 NOTE — Patient Instructions (Signed)
Diabetes and Foot Care Diabetes may cause you to have problems because of poor blood supply (circulation) to your feet and legs. This may cause the skin on your feet to become thinner, break easier, and heal more slowly. Your skin may become dry, and the skin may peel and crack. You may also have nerve damage in your legs and feet causing decreased feeling in them. You may not notice minor injuries to your feet that could lead to infections or more serious problems. Taking care of your feet is one of the most important things you can do for yourself.  HOME CARE INSTRUCTIONS  Wear shoes at all times, even in the house. Do not go barefoot. Bare feet are easily injured.  Check your feet daily for blisters, cuts, and redness. If you cannot see the bottom of your feet, use a mirror or ask someone for help.  Wash your feet with warm water (do not use hot water) and mild soap. Then pat your feet and the areas between your toes until they are completely dry. Do not soak your feet as this can dry your skin.  Apply a moisturizing lotion or petroleum jelly (that does not contain alcohol and is unscented) to the skin on your feet and to dry, brittle toenails. Do not apply lotion between your toes.  Trim your toenails straight across. Do not dig under them or around the cuticle. File the edges of your nails with an emery board or nail file.  Do not cut corns or calluses or try to remove them with medicine.  Wear clean socks or stockings every day. Make sure they are not too tight. Do not wear knee-high stockings since they may decrease blood flow to your legs.  Wear shoes that fit properly and have enough cushioning. To break in new shoes, wear them for just a few hours a day. This prevents you from injuring your feet. Always look in your shoes before you put them on to be sure there are no objects inside.  Do not cross your legs. This may decrease the blood flow to your feet.  If you find a minor scrape,  cut, or break in the skin on your feet, keep it and the skin around it clean and dry. These areas may be cleansed with mild soap and water. Do not cleanse the area with peroxide, alcohol, or iodine.  When you remove an adhesive bandage, be sure not to damage the skin around it.  If you have a wound, look at it several times a day to make sure it is healing.  Do not use heating pads or hot water bottles. They may burn your skin. If you have lost feeling in your feet or legs, you may not know it is happening until it is too late.  Make sure your health care provider performs a complete foot exam at least annually or more often if you have foot problems. Report any cuts, sores, or bruises to your health care provider immediately. SEEK MEDICAL CARE IF:   You have an injury that is not healing.  You have cuts or breaks in the skin.  You have an ingrown nail.  You notice redness on your legs or feet.  You feel burning or tingling in your legs or feet.  You have pain or cramps in your legs and feet.  Your legs or feet are numb.  Your feet always feel cold. SEEK IMMEDIATE MEDICAL CARE IF:   There is increasing redness,   swelling, or pain in or around a wound.  There is a red line that goes up your leg.  Pus is coming from a wound.  You develop a fever or as directed by your health care provider.  You notice a bad smell coming from an ulcer or wound.   This information is not intended to replace advice given to you by your health care provider. Make sure you discuss any questions you have with your health care provider.   Document Released: 12/17/1999 Document Revised: 08/21/2012 Document Reviewed: 05/28/2012 Elsevier Interactive Patient Education 2016 [employer]  

## 2015-08-20 DIAGNOSIS — M7989 Other specified soft tissue disorders: Secondary | ICD-10-CM | POA: Diagnosis not present

## 2015-08-20 DIAGNOSIS — Z23 Encounter for immunization: Secondary | ICD-10-CM | POA: Diagnosis not present

## 2015-10-04 DIAGNOSIS — I739 Peripheral vascular disease, unspecified: Secondary | ICD-10-CM | POA: Diagnosis not present

## 2015-10-04 DIAGNOSIS — E1142 Type 2 diabetes mellitus with diabetic polyneuropathy: Secondary | ICD-10-CM | POA: Diagnosis not present

## 2015-10-04 DIAGNOSIS — Z794 Long term (current) use of insulin: Secondary | ICD-10-CM | POA: Diagnosis not present

## 2015-10-04 DIAGNOSIS — E039 Hypothyroidism, unspecified: Secondary | ICD-10-CM | POA: Diagnosis not present

## 2015-10-04 DIAGNOSIS — E063 Autoimmune thyroiditis: Secondary | ICD-10-CM | POA: Diagnosis not present

## 2015-10-04 DIAGNOSIS — R609 Edema, unspecified: Secondary | ICD-10-CM | POA: Diagnosis not present

## 2015-10-21 DIAGNOSIS — J449 Chronic obstructive pulmonary disease, unspecified: Secondary | ICD-10-CM | POA: Diagnosis not present

## 2015-10-21 DIAGNOSIS — I10 Essential (primary) hypertension: Secondary | ICD-10-CM | POA: Diagnosis not present

## 2015-10-21 DIAGNOSIS — E039 Hypothyroidism, unspecified: Secondary | ICD-10-CM | POA: Diagnosis not present

## 2015-10-21 DIAGNOSIS — E1140 Type 2 diabetes mellitus with diabetic neuropathy, unspecified: Secondary | ICD-10-CM | POA: Diagnosis not present

## 2015-10-21 DIAGNOSIS — D649 Anemia, unspecified: Secondary | ICD-10-CM | POA: Diagnosis not present

## 2015-10-21 DIAGNOSIS — Z794 Long term (current) use of insulin: Secondary | ICD-10-CM | POA: Diagnosis not present

## 2015-10-21 DIAGNOSIS — E785 Hyperlipidemia, unspecified: Secondary | ICD-10-CM | POA: Diagnosis not present

## 2015-11-17 ENCOUNTER — Ambulatory Visit: Payer: Medicare Other | Admitting: Podiatry

## 2015-11-30 ENCOUNTER — Ambulatory Visit (INDEPENDENT_AMBULATORY_CARE_PROVIDER_SITE_OTHER): Payer: Medicare Other | Admitting: Podiatry

## 2015-11-30 ENCOUNTER — Encounter: Payer: Self-pay | Admitting: Podiatry

## 2015-11-30 VITALS — BP 113/66 | HR 88

## 2015-11-30 DIAGNOSIS — M79674 Pain in right toe(s): Secondary | ICD-10-CM | POA: Diagnosis not present

## 2015-11-30 DIAGNOSIS — B351 Tinea unguium: Secondary | ICD-10-CM | POA: Diagnosis not present

## 2015-11-30 DIAGNOSIS — M79675 Pain in left toe(s): Secondary | ICD-10-CM | POA: Diagnosis not present

## 2015-11-30 NOTE — Progress Notes (Signed)
Patient ID: Ernest Clark., male   DOB: [birth year], 82 y.o.   MRN: [id number]    Subjective: This patient presents today complaining of elongated and thickened toenails and requests toenail debridement. He states that the nails are cough and walking wearing shoes Patient is a diabetic with a known history of peripheral arterial disease that has been evaluated by Dr. Gwenlyn Found The patient's son is present in the treatment room today  Objective Vascular: Bilateral lower extremity edema Tenderness palpation bilaterally DP and PT pulses 0/4 bilaterally Capillary reflex leg bilaterally  Neurological: Sensation to 10 g monofilament wire intact 4/5 right 1/5 left Vibratory sensation reactive right nonreactive left Ankle reflexes equal reactive bilaterally   Dermatological: No open skin lesions bilaterally Corn distal third right toe Right ankle appears more edematous than the left The toenails are brittle hypertrophic, deformed and tender to direct palpation 6-10  Musculoskeletal: HAV right Hammertoe second right  Assessment: Peripheral neuropathy Peripheral vascular disease Diabetic Symptomatic onychomycoses 6-10  Plan: Debridement of toenails 6-10 mechanically and electrically without any bleeding Debride corn third right toe without any bleeding  Reappoint 3 months

## 2015-11-30 NOTE — Patient Instructions (Signed)

## 2016-01-11 ENCOUNTER — Telehealth: Payer: Self-pay | Admitting: *Deleted

## 2016-01-11 NOTE — Telephone Encounter (Signed)
Pt's son, Shoji Dize states pt is currently having increase pain and swelling in feet now that Dr. Buddy Duty is weaning pt off Gabapentin again. Pt's son, states initially Dr. Buddy Duty had taken pt off of the Gabapentin and son was wondering if Dr. Amalia Hailey had ordered Gabapentin for pt.  I reviewed Medications and Dr. Amalia Hailey had not ordered a prescription of Gabapentin, but 05/11/2015 Dr. Amalia Hailey chart note states, "Also, I recommended that patient restart his existing prescription for Gabapentin". Pt's son, states Dr. Buddy Duty had started pt on Cymbalta, and son was wondering if the Cymbalta and Gabapentin were okay to be taken together. I told pt's son, that there was not Cymbalta listed in pt's Medications. Pt's son states maybe it is Cetirizine I told him that was not listed in pt's Medication list. Pt's son, states maybe it was Crestor, was it not to be taken with the Gabapentin.  I had reviewed product literature in [facility] and [employer] and did not see a reaction between Crestor and Gabapentin, I informed pt's son of this. Pt's son states he will check with Dr. Buddy Duty and he feels pt needs another appt with Dr. Amalia Hailey.

## 2016-02-29 ENCOUNTER — Ambulatory Visit: Payer: Medicare Other | Admitting: Podiatry

## 2016-02-29 ENCOUNTER — Ambulatory Visit (INDEPENDENT_AMBULATORY_CARE_PROVIDER_SITE_OTHER): Payer: Medicare Other | Admitting: Podiatry

## 2016-02-29 ENCOUNTER — Encounter: Payer: Self-pay | Admitting: Podiatry

## 2016-02-29 DIAGNOSIS — I739 Peripheral vascular disease, unspecified: Secondary | ICD-10-CM

## 2016-02-29 DIAGNOSIS — M79674 Pain in right toe(s): Secondary | ICD-10-CM

## 2016-02-29 DIAGNOSIS — M79675 Pain in left toe(s): Secondary | ICD-10-CM

## 2016-02-29 DIAGNOSIS — B351 Tinea unguium: Secondary | ICD-10-CM | POA: Diagnosis not present

## 2016-02-29 NOTE — Patient Instructions (Addendum)
History of toe injury third left proximally 2 weeks ago causing some partial tearing of the toenail. Apply topical antibiotic ointment and Band-Aid to the third left toenail daily until the skin looks normal  Diabetes and Foot Care Diabetes may cause you to have problems because of poor blood supply (circulation) to your feet and legs. This may cause the skin on your feet to become thinner, break easier, and heal more slowly. Your skin may become dry, and the skin may peel and crack. You may also have nerve damage in your legs and feet causing decreased feeling in them. You may not notice minor injuries to your feet that could lead to infections or more serious problems. Taking care of your feet is one of the most important things you can do for yourself. Follow these instructions at home:  Wear shoes at all times, even in the house. Do not go barefoot. Bare feet are easily injured.  Check your feet daily for blisters, cuts, and redness. If you cannot see the bottom of your feet, use a mirror or ask someone for help.  Wash your feet with warm water (do not use hot water) and mild soap. Then pat your feet and the areas between your toes until they are completely dry. Do not soak your feet as this can dry your skin.  Apply a moisturizing lotion or petroleum jelly (that does not contain alcohol and is unscented) to the skin on your feet and to dry, brittle toenails. Do not apply lotion between your toes.  Trim your toenails straight across. Do not dig under them or around the cuticle. File the edges of your nails with an emery board or nail file.  Do not cut corns or calluses or try to remove them with medicine.  Wear clean socks or stockings every day. Make sure they are not too tight. Do not wear knee-high stockings since they may decrease blood flow to your legs.  Wear shoes that fit properly and have enough cushioning. To break in new shoes, wear them for just a few hours a day. This prevents  you from injuring your feet. Always look in your shoes before you put them on to be sure there are no objects inside.  Do not cross your legs. This may decrease the blood flow to your feet.  If you find a minor scrape, cut, or break in the skin on your feet, keep it and the skin around it clean and dry. These areas may be cleansed with mild soap and water. Do not cleanse the area with peroxide, alcohol, or iodine.  When you remove an adhesive bandage, be sure not to damage the skin around it.  If you have a wound, look at it several times a day to make sure it is healing.  Do not use heating pads or hot water bottles. They may burn your skin. If you have lost feeling in your feet or legs, you may not know it is happening until it is too late.  Make sure your health care provider performs a complete foot exam at least annually or more often if you have foot problems. Report any cuts, sores, or bruises to your health care provider immediately. Contact a health care provider if:  You have an injury that is not healing.  You have cuts or breaks in the skin.  You have an ingrown nail.  You notice redness on your legs or feet.  You feel burning or tingling in your legs  or feet.  You have pain or cramps in your legs and feet.  Your legs or feet are numb.  Your feet always feel cold. Get help right away if:  There is increasing redness, swelling, or pain in or around a wound.  There is a red line that goes up your leg.  Pus is coming from a wound.  You develop a fever or as directed by your health care provider.  You notice a bad smell coming from an ulcer or wound. This information is not intended to replace advice given to you by your health care provider. Make sure you discuss any questions you have with your health care provider. Document Released: 12/17/1999 Document Revised: 05/27/2015 Document Reviewed: 05/28/2012 Elsevier Interactive Patient Education  2017 [employer].

## 2016-02-29 NOTE — Progress Notes (Signed)
Patient ID: Ernest Clark., male   DOB: Nov 04, 1931, 83 y.o.   MRN: [id number]    Subjective: This patient presents today complaining of elongated and thickened toenails and requests toenail debridement. He states that the nails are cough and walking wearing shoes Patient is a diabetic with a known history of peripheral arterial disease that has been evaluated by Dr. Gwenlyn Found Patient recalls stubbing third left toe approximately 2 weeks ago and describes some loosening of the toenail he says that the areas improving and he's been applying some ointment to the area The patient's son is present in the treatment room today  Objective Vascular: Bilateral lower extremity edema Tenderness palpation bilaterally DP and PT pulses 0/4 bilaterally Capillary reflex leg bilaterally  Neurological: Sensation to 10 g monofilament wire intact 4/5 right 1/5 left Vibratory sensation reactive right nonreactive left Ankle reflexes equal reactive bilaterally   Dermatological: No open skin lesions bilaterally Corn distal third right toe Right ankle appears more edematous than the left The toenails are brittle hypertrophic, deformed and tender to direct palpation 6-10 The third left toenail is partially detached from the nailbed distally with eschar. There is no surrounding erythema, edema, warmth, drainage from the third left toenail. Musculoskeletal: HAV right Hammertoe second right  Assessment: Peripheral neuropathy Peripheral vascular disease Diabetic Symptomatic onychomycoses 6-10 Partial traumatic injury of third left toenail without clinical sign of infection  Plan: Debridement of toenails 6-10 mechanically and electrically without anybleeding Debride corn third right toe without any bleeding Patient instructed apply topical antibiotic ointment and Band-Aid daily to the third left toenail until the surrounding tissues look normal without eschar  Reappoint 3 months

## 2016-04-04 DIAGNOSIS — E063 Autoimmune thyroiditis: Secondary | ICD-10-CM | POA: Diagnosis not present

## 2016-04-04 DIAGNOSIS — E1142 Type 2 diabetes mellitus with diabetic polyneuropathy: Secondary | ICD-10-CM | POA: Diagnosis not present

## 2016-04-04 DIAGNOSIS — E039 Hypothyroidism, unspecified: Secondary | ICD-10-CM | POA: Diagnosis not present

## 2016-04-04 DIAGNOSIS — R609 Edema, unspecified: Secondary | ICD-10-CM | POA: Diagnosis not present

## 2016-04-04 DIAGNOSIS — I739 Peripheral vascular disease, unspecified: Secondary | ICD-10-CM | POA: Diagnosis not present

## 2016-04-04 DIAGNOSIS — Z794 Long term (current) use of insulin: Secondary | ICD-10-CM | POA: Diagnosis not present

## 2016-05-12 DIAGNOSIS — E039 Hypothyroidism, unspecified: Secondary | ICD-10-CM | POA: Diagnosis not present

## 2016-05-30 ENCOUNTER — Ambulatory Visit (INDEPENDENT_AMBULATORY_CARE_PROVIDER_SITE_OTHER): Payer: Medicare Other | Admitting: Podiatry

## 2016-05-30 ENCOUNTER — Encounter: Payer: Self-pay | Admitting: Podiatry

## 2016-05-30 DIAGNOSIS — E1142 Type 2 diabetes mellitus with diabetic polyneuropathy: Secondary | ICD-10-CM | POA: Diagnosis not present

## 2016-05-30 DIAGNOSIS — B351 Tinea unguium: Secondary | ICD-10-CM | POA: Diagnosis not present

## 2016-05-30 DIAGNOSIS — M79674 Pain in right toe(s): Secondary | ICD-10-CM | POA: Diagnosis not present

## 2016-05-30 DIAGNOSIS — I739 Peripheral vascular disease, unspecified: Secondary | ICD-10-CM | POA: Diagnosis not present

## 2016-05-30 DIAGNOSIS — M79675 Pain in left toe(s): Secondary | ICD-10-CM | POA: Diagnosis not present

## 2016-05-30 NOTE — Patient Instructions (Signed)

## 2016-05-30 NOTE — Progress Notes (Signed)
Patient ID: Ernest W Newcom Jr., male   DOB: 10/07/1931, 83 y.o.   MRN: [id number]      Subjective: This patient presents today complaining of elongated and thickened toenails and requests toenail debridement. He states that the nails are cough and walking wearing shoes Patient is a diabetic with a known history of peripheral arterial disease that has been evaluated by Dr. Berry The patient's son is present in the treatment room today  Objective Vascular: Bilateral lower extremity pitting edema Tenderness palpation bilaterally DP and PT pulses 0/4 bilaterally Capillary reflex leg bilaterally  Neurological: Sensation to 10 g monofilament wire intact 4/5 right 1/5 left Vibratory sensation reactive right nonreactive left Ankle reflexes equal reactive bilaterally   Dermatological: Atrophic skin with absent hair growth bilaterally No open skin lesions bilaterally Corn distal third right toe Plantar callus fifth MPJ right Right ankle appears more edematous than the left The toenails are brittle hypertrophic, deformed and tender to direct palpation 6-10 The fifth left toenailis partially detached from the nailbed distally, There is no surrounding erythema, edema, warmth, drainage from the fifth left toenail. The entire fifth nail plates left plates left after debridement  Musculoskeletal: HAV right Hammertoe second right  Assessment: Diabetic Peripheral neuropathy Peripheral vascular disease Symptomatic onychomycoses 6-10   Plan: Debridement of toenails 6-10 mechanically and electrically without anybleeding Debride corn third right toe without any bleeding  Reappoint 3 months 

## 2016-07-04 ENCOUNTER — Encounter (HOSPITAL_COMMUNITY): Payer: Self-pay | Admitting: Emergency Medicine

## 2016-07-04 ENCOUNTER — Emergency Department (HOSPITAL_COMMUNITY): Payer: Medicare Other

## 2016-07-04 ENCOUNTER — Inpatient Hospital Stay (HOSPITAL_COMMUNITY)
Admission: EM | Admit: 2016-07-04 | Discharge: 2016-07-08 | DRG: 184 | Disposition: A | Discharge status: Skilled Nursing Facility | Payer: Medicare Other | Attending: Internal Medicine | Admitting: Internal Medicine

## 2016-07-04 DIAGNOSIS — E1151 Type 2 diabetes mellitus with diabetic peripheral angiopathy without gangrene: Secondary | ICD-10-CM | POA: Diagnosis present

## 2016-07-04 DIAGNOSIS — Z9861 Coronary angioplasty status: Secondary | ICD-10-CM

## 2016-07-04 DIAGNOSIS — J441 Chronic obstructive pulmonary disease with (acute) exacerbation: Secondary | ICD-10-CM | POA: Diagnosis not present

## 2016-07-04 DIAGNOSIS — I10 Essential (primary) hypertension: Secondary | ICD-10-CM | POA: Diagnosis present

## 2016-07-04 DIAGNOSIS — Z794 Long term (current) use of insulin: Secondary | ICD-10-CM | POA: Diagnosis not present

## 2016-07-04 DIAGNOSIS — W19XXXA Unspecified fall, initial encounter: Secondary | ICD-10-CM

## 2016-07-04 DIAGNOSIS — E119 Type 2 diabetes mellitus without complications: Secondary | ICD-10-CM | POA: Diagnosis not present

## 2016-07-04 DIAGNOSIS — R0781 Pleurodynia: Secondary | ICD-10-CM | POA: Diagnosis not present

## 2016-07-04 DIAGNOSIS — M109 Gout, unspecified: Secondary | ICD-10-CM | POA: Diagnosis present

## 2016-07-04 DIAGNOSIS — D72829 Elevated white blood cell count, unspecified: Secondary | ICD-10-CM | POA: Diagnosis present

## 2016-07-04 DIAGNOSIS — S299XXA Unspecified injury of thorax, initial encounter: Secondary | ICD-10-CM | POA: Diagnosis not present

## 2016-07-04 DIAGNOSIS — S0990XA Unspecified injury of head, initial encounter: Secondary | ICD-10-CM

## 2016-07-04 DIAGNOSIS — J449 Chronic obstructive pulmonary disease, unspecified: Secondary | ICD-10-CM | POA: Diagnosis present

## 2016-07-04 DIAGNOSIS — K219 Gastro-esophageal reflux disease without esophagitis: Secondary | ICD-10-CM | POA: Diagnosis present

## 2016-07-04 DIAGNOSIS — I2583 Coronary atherosclerosis due to lipid rich plaque: Secondary | ICD-10-CM | POA: Diagnosis not present

## 2016-07-04 DIAGNOSIS — I739 Peripheral vascular disease, unspecified: Secondary | ICD-10-CM | POA: Diagnosis not present

## 2016-07-04 DIAGNOSIS — I2510 Atherosclerotic heart disease of native coronary artery without angina pectoris: Secondary | ICD-10-CM | POA: Diagnosis not present

## 2016-07-04 DIAGNOSIS — S199XXA Unspecified injury of neck, initial encounter: Secondary | ICD-10-CM | POA: Diagnosis not present

## 2016-07-04 DIAGNOSIS — R0600 Dyspnea, unspecified: Secondary | ICD-10-CM

## 2016-07-04 DIAGNOSIS — S2249XA Multiple fractures of ribs, unspecified side, initial encounter for closed fracture: Secondary | ICD-10-CM | POA: Diagnosis present

## 2016-07-04 DIAGNOSIS — R972 Elevated prostate specific antigen [PSA]: Secondary | ICD-10-CM | POA: Diagnosis present

## 2016-07-04 DIAGNOSIS — E785 Hyperlipidemia, unspecified: Secondary | ICD-10-CM | POA: Diagnosis present

## 2016-07-04 DIAGNOSIS — E871 Hypo-osmolality and hyponatremia: Secondary | ICD-10-CM | POA: Diagnosis not present

## 2016-07-04 DIAGNOSIS — S2241XA Multiple fractures of ribs, right side, initial encounter for closed fracture: Secondary | ICD-10-CM | POA: Diagnosis not present

## 2016-07-04 DIAGNOSIS — R600 Localized edema: Secondary | ICD-10-CM | POA: Diagnosis present

## 2016-07-04 DIAGNOSIS — R911 Solitary pulmonary nodule: Secondary | ICD-10-CM | POA: Diagnosis present

## 2016-07-04 DIAGNOSIS — Z86718 Personal history of other venous thrombosis and embolism: Secondary | ICD-10-CM

## 2016-07-04 DIAGNOSIS — Z7982 Long term (current) use of aspirin: Secondary | ICD-10-CM

## 2016-07-04 DIAGNOSIS — Z79899 Other long term (current) drug therapy: Secondary | ICD-10-CM

## 2016-07-04 DIAGNOSIS — W06XXXA Fall from bed, initial encounter: Secondary | ICD-10-CM | POA: Diagnosis present

## 2016-07-04 DIAGNOSIS — Z87891 Personal history of nicotine dependence: Secondary | ICD-10-CM

## 2016-07-04 DIAGNOSIS — S0081XA Abrasion of other part of head, initial encounter: Secondary | ICD-10-CM | POA: Diagnosis not present

## 2016-07-04 LAB — CBC WITH DIFFERENTIAL/PLATELET
Basophils Absolute: 0.1 10*3/uL (ref 0.0–0.1)
Basophils Relative: 0 %
Eosinophils Absolute: 0.2 10*3/uL (ref 0.0–0.7)
Eosinophils Relative: 1 %
HCT: 39.3 % (ref 39.0–52.0)
Hemoglobin: 13 g/dL (ref 13.0–17.0)
Lymphocytes Relative: 13 %
Lymphs Abs: 1.8 10*3/uL (ref 0.7–4.0)
MCH: 29.3 pg (ref 26.0–34.0)
MCHC: 33.1 g/dL (ref 30.0–36.0)
MCV: 88.5 fL (ref 78.0–100.0)
Monocytes Absolute: 1 10*3/uL (ref 0.1–1.0)
Monocytes Relative: 7 %
Neutro Abs: 11.2 10*3/uL — ABNORMAL HIGH (ref 1.7–7.7)
Neutrophils Relative %: 79 %
Platelets: 284 10*3/uL (ref 150–400)
RBC: 4.44 MIL/uL (ref 4.22–5.81)
RDW: 14 % (ref 11.5–15.5)
WBC: 14.3 10*3/uL — ABNORMAL HIGH (ref 4.0–10.5)

## 2016-07-04 LAB — I-STAT CHEM 8, ED
BUN: 18 mg/dL (ref 6–20)
Calcium, Ion: 0.98 mmol/L — ABNORMAL LOW (ref 1.15–1.40)
Chloride: 96 mmol/L — ABNORMAL LOW (ref 101–111)
Creatinine, Ser: 0.9 mg/dL (ref 0.61–1.24)
Glucose, Bld: 122 mg/dL — ABNORMAL HIGH (ref 65–99)
HCT: 42 % (ref 39.0–52.0)
Hemoglobin: 14.3 g/dL (ref 13.0–17.0)
Potassium: 4.7 mmol/L (ref 3.5–5.1)
Sodium: 128 mmol/L — ABNORMAL LOW (ref 135–145)
TCO2: 23 mmol/L (ref 0–100)

## 2016-07-04 LAB — BASIC METABOLIC PANEL
Anion gap: 9 (ref 5–15)
BUN: 15 mg/dL (ref 6–20)
CO2: 25 mmol/L (ref 22–32)
Calcium: 9.2 mg/dL (ref 8.9–10.3)
Chloride: 93 mmol/L — ABNORMAL LOW (ref 101–111)
Creatinine, Ser: 1.01 mg/dL (ref 0.61–1.24)
GFR calc Af Amer: >60 mL/min (ref >60)
GFR calc non Af Amer: >60 mL/min (ref >60)
Glucose, Bld: 120 mg/dL — ABNORMAL HIGH (ref 65–99)
Potassium: 4.7 mmol/L (ref 3.5–5.1)
Sodium: 127 mmol/L — ABNORMAL LOW (ref 135–145)

## 2016-07-04 LAB — GLUCOSE, CAPILLARY: Glucose-Capillary: 200 mg/dL — ABNORMAL HIGH (ref 65–99)

## 2016-07-04 MED ORDER — ALBUTEROL SULFATE (2.5 MG/3ML) 0.083% IN NEBU
2.5000 mg | INHALATION_SOLUTION | Freq: Two times a day (BID) | RESPIRATORY_TRACT | Status: DC
  Administered 2016-07-05 – 2016-07-07 (×5): 2.5 mg via RESPIRATORY_TRACT
  Filled 2016-07-04 (×6): qty 3

## 2016-07-04 MED ORDER — INSULIN ASPART 100 UNIT/ML ~~LOC~~ SOLN
0.0000 [IU] | Freq: Three times a day (TID) | SUBCUTANEOUS | Status: DC
  Administered 2016-07-05 – 2016-07-07 (×7): 2 [IU] via SUBCUTANEOUS
  Administered 2016-07-08: 3 [IU] via SUBCUTANEOUS

## 2016-07-04 MED ORDER — INSULIN GLARGINE 100 UNIT/ML ~~LOC~~ SOLN
26.0000 [IU] | Freq: Every morning | SUBCUTANEOUS | Status: DC
  Administered 2016-07-05 – 2016-07-08 (×4): 26 [IU] via SUBCUTANEOUS
  Filled 2016-07-04 (×5): qty 0.26

## 2016-07-04 MED ORDER — ROSUVASTATIN CALCIUM 40 MG PO TABS
40.0000 mg | ORAL_TABLET | Freq: Every day | ORAL | Status: DC
  Administered 2016-07-05 – 2016-07-08 (×4): 40 mg via ORAL
  Filled 2016-07-04 (×5): qty 1

## 2016-07-04 MED ORDER — HYDROCODONE-ACETAMINOPHEN 5-325 MG PO TABS
1.0000 | ORAL_TABLET | ORAL | Status: DC | PRN
  Administered 2016-07-05 – 2016-07-08 (×4): 1 via ORAL
  Filled 2016-07-04 (×5): qty 1

## 2016-07-04 MED ORDER — INSULIN ASPART 100 UNIT/ML ~~LOC~~ SOLN: 0.0000 [IU] | Freq: Every day | SUBCUTANEOUS | Status: DC

## 2016-07-04 MED ORDER — ASPIRIN EC 81 MG PO TBEC
81.0000 mg | DELAYED_RELEASE_TABLET | Freq: Every day | ORAL | Status: DC
  Administered 2016-07-05 – 2016-07-08 (×4): 81 mg via ORAL
  Filled 2016-07-04 (×4): qty 1

## 2016-07-04 MED ORDER — IPRATROPIUM BROMIDE 0.02 % IN SOLN
0.5000 mg | Freq: Once | RESPIRATORY_TRACT | Status: AC
  Administered 2016-07-04: 0.5 mg via RESPIRATORY_TRACT
  Filled 2016-07-04: qty 2.5

## 2016-07-04 MED ORDER — SODIUM CHLORIDE 0.9 % IV SOLN
INTRAVENOUS | Status: DC
  Administered 2016-07-04 – 2016-07-07 (×5): via INTRAVENOUS

## 2016-07-04 MED ORDER — IPRATROPIUM-ALBUTEROL 0.5-2.5 (3) MG/3ML IN SOLN
3.0000 mL | Freq: Once | RESPIRATORY_TRACT | Status: AC
  Administered 2016-07-04: 3 mL via RESPIRATORY_TRACT
  Filled 2016-07-04: qty 3

## 2016-07-04 MED ORDER — ENOXAPARIN SODIUM 40 MG/0.4ML ~~LOC~~ SOLN
40.0000 mg | SUBCUTANEOUS | Status: DC
  Administered 2016-07-05 – 2016-07-08 (×4): 40 mg via SUBCUTANEOUS
  Filled 2016-07-04 (×4): qty 0.4

## 2016-07-04 MED ORDER — ALBUTEROL SULFATE (2.5 MG/3ML) 0.083% IN NEBU
5.0000 mg | INHALATION_SOLUTION | Freq: Once | RESPIRATORY_TRACT | Status: AC
  Administered 2016-07-04: 5 mg via RESPIRATORY_TRACT
  Filled 2016-07-04: qty 6

## 2016-07-04 MED ORDER — IOPAMIDOL (ISOVUE-300) INJECTION 61%
INTRAVENOUS | Status: AC
  Administered 2016-07-04: 75 mL
  Filled 2016-07-04: qty 75

## 2016-07-04 MED ORDER — ALLOPURINOL 100 MG PO TABS
100.0000 mg | ORAL_TABLET | Freq: Every day | ORAL | Status: DC
  Administered 2016-07-05 – 2016-07-08 (×4): 100 mg via ORAL
  Filled 2016-07-04 (×4): qty 1

## 2016-07-04 MED ORDER — MORPHINE SULFATE (PF) 4 MG/ML IV SOLN
4.0000 mg | Freq: Once | INTRAVENOUS | Status: AC
  Administered 2016-07-04: 4 mg via INTRAVENOUS
  Filled 2016-07-04: qty 1

## 2016-07-04 MED ORDER — ONDANSETRON HCL 4 MG PO TABS: 4.0000 mg | ORAL_TABLET | Freq: Four times a day (QID) | ORAL | Status: DC | PRN

## 2016-07-04 MED ORDER — PANTOPRAZOLE SODIUM 40 MG PO TBEC
40.0000 mg | DELAYED_RELEASE_TABLET | Freq: Every day | ORAL | Status: DC
  Administered 2016-07-05 – 2016-07-08 (×4): 40 mg via ORAL
  Filled 2016-07-04 (×4): qty 1

## 2016-07-04 MED ORDER — MOMETASONE FURO-FORMOTEROL FUM 200-5 MCG/ACT IN AERO
2.0000 | INHALATION_SPRAY | Freq: Two times a day (BID) | RESPIRATORY_TRACT | Status: DC
  Administered 2016-07-05 – 2016-07-08 (×8): 2 via RESPIRATORY_TRACT
  Filled 2016-07-04 (×2): qty 8.8

## 2016-07-04 MED ORDER — IRBESARTAN 300 MG PO TABS
300.0000 mg | ORAL_TABLET | Freq: Every day | ORAL | Status: DC
  Administered 2016-07-05 – 2016-07-08 (×4): 300 mg via ORAL
  Filled 2016-07-04 (×4): qty 1

## 2016-07-04 MED ORDER — ONDANSETRON HCL 4 MG/2ML IJ SOLN: 4.0000 mg | Freq: Four times a day (QID) | INTRAMUSCULAR | Status: DC | PRN

## 2016-07-04 NOTE — ED Notes (Signed)
Pt returned from CT °

## 2016-07-04 NOTE — ED Notes (Signed)
This RN helped pt to use the urinal. Pt was difficult to assist to standing and was unsteady on his feet.

## 2016-07-04 NOTE — ED Notes (Signed)
Ernest Clark, Ernest Clark, [phone].

## 2016-07-04 NOTE — ED Provider Notes (Signed)
[facility] DEPT Provider Note   CSN: [id number] Arrival date & time: 07/04/16  1150     History   Chief Complaint Chief Complaint  Patient presents with  . Fall  . Rib Injury  . Shoulder Pain    HPI Ernest Clark. is a 83 y.o. male.  Ernest Clark. Is a 83 y.o. Male who presents to the emergency department after a slip and fall 2 days ago complaining of right rib pain. Patient reports he was getting out of bed 2 days ago and put his hand on a railing when it slipped causing him to fall onto his right side of his chest and his head. He reports he has done this twice in the past week. His son is at bedside and reports that the railings are there to keep him from rolling out of bed. They're not designed to hold his weight. Patient reports he last fell 2 days ago. He reports pain to his right side of his ribs. He also reports hitting his head and denies loss of consciousness. He reports a minor headache. He does not take anticoagulants. Son reports that he chronically has some wheezing and takes albuterol as needed. Patient denies fevers, increasing cough, shortness of breath, chest pain, abdominal pain, nausea, vomiting, numbness, tingling, weakness, changes to his vision, neck pain, back pain, or rashes.    The history is provided by the patient, medical records and a relative. No language interpreter was used.  Fall  Associated symptoms include chest pain and headaches. Pertinent negatives include no abdominal pain and no shortness of breath.  Shoulder Pain   Pertinent negatives include no numbness.    Past Medical History:  Diagnosis Date  . COPD (chronic obstructive pulmonary disease) ([place])   . Coronary heart disease    remote PCI  . Diabetes mellitus   . DVT (deep venous thrombosis) ([place]) 2011  . Gout   . Hyperlipidemia   . Hypertension   . Peripheral arterial disease [facility])     Patient Active Problem List   Diagnosis Date Noted  . Peripheral arterial  disease ([place]) 09/25/2013  . Coronary artery disease 09/25/2013  . Essential hypertension 09/25/2013  . Hyperlipidemia 09/25/2013  . COPD (chronic obstructive pulmonary disease) ([place]) 12/09/2010  . HBP (high blood pressure) 12/09/2010    Past Surgical History:  Procedure Laterality Date  . CARDIAC CATHETERIZATION  1994   w/ PTCA   . COLONOSCOPY     w/ polyp resection  . TONSILLECTOMY  as a child       Home Medications    Prior to Admission medications   Medication Sig Start Date End Date Taking? Authorizing Provider  albuterol (PROAIR HFA) 108 (90 BASE) MCG/ACT inhaler Inhale 2 puffs into the lungs every 6 (six) hours as needed for shortness of breath.     [provider]  allopurinol (ZYLOPRIM) 100 MG tablet TK 1 T PO D 10/13/14   [provider]  aspirin 81 MG tablet Take 81 mg by mouth daily.     [provider]  betamethasone dipropionate (DIPROLENE) 0.05 % cream Apply topically 2 (two) times daily.      [provider]  cephALEXin (KEFLEX) 500 MG capsule TK ONE C PO  TID X 10 DAYS 08/11/14   [provider]  ezetimibe (ZETIA) 10 MG tablet Take 10 mg by mouth daily.      [provider]  FLUZONE HIGH-DOSE 0.5 ML SUSY ADM 0.5ML IM UTD  10/22/14   [provider]  furosemide (LASIX) 20 MG tablet Take 40 mg by mouth daily.  03/22/15   [provider]  furosemide (LASIX) 40 MG tablet TK 1 T PO ONCE A DAY 04/09/15   [provider]  glimepiride (AMARYL) 2 MG tablet Take 2 mg by mouth 2 (two) times daily.      [provider]  HYDROcodone-acetaminophen (NORCO/VICODIN) 5-325 MG tablet Take 1 tablet by mouth every 4 (four) hours as needed for moderate pain or severe pain. 03/07/15   Ernest Pean, PA-C  indomethacin (INDOCIN) 25 MG capsule Take 25 mg by mouth as needed.      [provider]  Mometasone Furo-Formoterol Fum (DULERA) 200-5 MCG/ACT AERO Inhale 2 puffs into the lungs 2 (two) times  daily. 02/23/11   Tanda Rockers, MD  montelukast (SINGULAIR) 10 MG tablet Take 10 mg by mouth daily.      [provider]  olmesartan (BENICAR) 40 MG tablet One half daily 01/26/11   Tanda Rockers, MD  rosuvastatin (CRESTOR) 40 MG tablet Take 40 mg by mouth daily.      [provider]  sildenafil (VIAGRA) 100 MG tablet Take 100 mg by mouth daily as needed.      [provider]  sitaGLIPtin (JANUVIA) 100 MG tablet Take 100 mg by mouth daily.    [provider]  Skin Protectants, Misc. (EUCERIN) cream Apply 1 application topically as needed for dry skin.    [provider]  traMADol (ULTRAM) 50 MG tablet 50 mg every 12 (twelve) hours as needed.  03/22/15   [provider]  triamcinolone cream (KENALOG) 0.1 % APP EXT  TO LEGS ATN 09/21/14   [provider]  triamterene-hydrochlorothiazide (DYAZIDE) 37.5-25 MG capsule TK ONE C PO QAM 08/27/14   [provider]    Family History Family History  Problem Relation Age of Onset  . Diabetes Father   . Diabetes Mother   . Diabetes Brother   . Diabetes Brother     Social History Social History  Substance Use Topics  . Smoking status: Former Smoker    Packs/day: 2.00    Years: 60.00    Types: Cigarettes    Quit date: 01/02/2001  . Smokeless tobacco: Former Systems developer    Types: Chew  . Alcohol use No     Allergies   Actos [pioglitazone hydrochloride] and Metformin and related   Review of Systems Review of Systems  Constitutional: Negative for chills and fever.  HENT: Negative for congestion and sore throat.   Eyes: Negative for visual disturbance.  Respiratory: Positive for wheezing (chronic ). Negative for cough and shortness of breath.   Cardiovascular: Positive for chest pain. Negative for palpitations.  Gastrointestinal: Negative for abdominal pain, diarrhea, nausea and vomiting.  Genitourinary: Negative for difficulty urinating, dysuria and urgency.    Musculoskeletal: Negative for back pain and neck pain.  Skin: Negative for rash.  Neurological: Positive for headaches. Negative for dizziness, syncope, weakness, light-headedness and numbness.     Physical Exam Updated Vital Signs BP 138/72   Pulse (!) 101   Temp 97.9 F (36.6 C) (Oral)   Resp 17   Ht 5' 8.5" (1.74 m)   Wt 111.6 kg (246 lb)   SpO2 97%   BMI 36.86 kg/m   Physical Exam  Constitutional: He is oriented to person, place, and time. He appears well-developed and well-nourished. No distress.  Nontoxic appearing. Obese male.  HENT:  Head:  Normocephalic and atraumatic.  Right Ear: External ear normal.  Left Ear: External ear normal.  Mouth/Throat: Oropharynx is clear and moist.  Superficial old appearing abrasion noted to his left forehead. No other visible or palpated signs of head injury or trauma.  Eyes: Conjunctivae and EOM are normal. Pupils are equal, round, and reactive to light. Right eye exhibits no discharge. Left eye exhibits no discharge.  Neck: Normal range of motion. Neck supple. No JVD present.  No midline neck tenderness to palpation. No crepitus or step-offs.  Cardiovascular: Normal rate, regular rhythm, normal heart sounds and intact distal pulses.  Exam reveals no gallop and no friction rub.   No murmur heard. Pulmonary/Chest: Effort normal. No stridor. No respiratory distress. He has wheezes. He has no rales. He exhibits tenderness.  Scattered wheezes noted bilaterally. No increased work of breathing. No rales or rhonchi. Symmetric chest expansion bilaterally. Patient has right-sided chest wall tenderness to palpation which reproduces his pain. No crepitus or deformity. No ecchymosis.  Abdominal: Soft. There is no tenderness. There is no guarding.  Musculoskeletal: Normal range of motion. He exhibits no edema, tenderness or deformity.  Patient is spontaneously moving all extremities in a coordinated fashion exhibiting good strength. Patient's  bilateral shoulder, elbow, wrist, hip, knee and ankle joints are supple and nontender to palpation. No midline back tenderness to palpation.  Lymphadenopathy:    He has no cervical adenopathy.  Neurological: He is alert and oriented to person, place, and time. No cranial nerve deficit or sensory deficit. He exhibits normal muscle tone. Coordination normal.  Alert and oriented 3. Speech is clear and coherent. No pronator drift. Sensation and strength is intact in his bilateral upper and lower extremities. EOMs are intact. Vision is grossly intact.  Skin: Skin is warm and dry. Capillary refill takes less than 2 seconds. No rash noted. He is not diaphoretic. No erythema. No pallor.  Psychiatric: He has a normal mood and affect. His behavior is normal.  Nursing note and vitals reviewed.    ED Treatments / Results  Labs (all labs ordered are listed, but only abnormal results are displayed) Labs Reviewed  I-STAT CHEM 8, ED - Abnormal; Notable for the following:       Result Value   Sodium 128 (*)    Chloride 96 (*)    Glucose, Bld 122 (*)    Calcium, Ion 0.98 (*)    All other components within normal limits  BASIC METABOLIC PANEL  CBC WITH DIFFERENTIAL/PLATELET    EKG  EKG Interpretation None       Radiology Dg Ribs Unilateral W/chest Right  Result Date: 07/04/2016 CLINICAL DATA:  Status post fall from bed twice this morning. Generalized right ribcage pain. EXAM: RIGHT RIBS AND CHEST - 3+ VIEW COMPARISON:  Chest x-ray of September 14, 2004 FINDINGS: The lungs are adequately inflated. There is no focal infiltrate. There is no pneumothorax nor definite pleural effusion. The heart and pulmonary vascularity are normal. There is calcification in the wall of the aortic arch. Right rib detail images reveal minimally displaced fractures of the lateral aspects of the right fifth, sixth, and seventh ribs. IMPRESSION: Fractures of the lateral aspects of the right fifth through seventh ribs. No  pneumothorax nor significant pleural effusion. Mild chronic bronchitic changes. Thoracic aortic atherosclerosis. Electronically Signed   By: David  Martinique M.D.   On: 07/04/2016 14:54   Ct Head Wo Contrast  Result Date: 07/04/2016 CLINICAL DATA:  Fall, hit head. EXAM: CT HEAD WITHOUT CONTRAST  CT CERVICAL SPINE WITHOUT CONTRAST TECHNIQUE: Multidetector CT imaging of the head and cervical spine was performed following the standard protocol without intravenous contrast. Multiplanar CT image reconstructions of the cervical spine were also generated. COMPARISON:  03/07/2015 FINDINGS: CT HEAD FINDINGS Brain: There is atrophy and chronic small vessel disease changes. No acute intracranial abnormality. Specifically, no hemorrhage, hydrocephalus, mass lesion, acute infarction, or significant intracranial injury. Vascular: No hyperdense vessel or unexpected calcification. Skull: No acute calvarial abnormality. Sinuses/Orbits: Visualized paranasal sinuses and mastoids clear. Orbital soft tissues unremarkable. Other: None CT CERVICAL SPINE FINDINGS Alignment: Normal Skull base and vertebrae: No fracture Soft tissues and spinal canal: Prevertebral soft tissues are normal. No epidural or paraspinal hematoma. Disc levels: Diffuse degenerative disc and facet disease throughout the cervical spine. Upper chest: Negative Other: Carotid artery calcifications again noted, unchanged. IMPRESSION: No acute intracranial abnormality. Atrophy, chronic microvascular disease. No acute bony abnormality in the cervical spine. Degenerative disc and facet disease. Electronically Signed   By: Rolm Baptise M.D.   On: 07/04/2016 15:14   Ct Cervical Spine Wo Contrast  Result Date: 07/04/2016 CLINICAL DATA:  Fall, hit head. EXAM: CT HEAD WITHOUT CONTRAST CT CERVICAL SPINE WITHOUT CONTRAST TECHNIQUE: Multidetector CT imaging of the head and cervical spine was performed following the standard protocol without intravenous contrast. Multiplanar CT  image reconstructions of the cervical spine were also generated. COMPARISON:  03/07/2015 FINDINGS: CT HEAD FINDINGS Brain: There is atrophy and chronic small vessel disease changes. No acute intracranial abnormality. Specifically, no hemorrhage, hydrocephalus, mass lesion, acute infarction, or significant intracranial injury. Vascular: No hyperdense vessel or unexpected calcification. Skull: No acute calvarial abnormality. Sinuses/Orbits: Visualized paranasal sinuses and mastoids clear. Orbital soft tissues unremarkable. Other: None CT CERVICAL SPINE FINDINGS Alignment: Normal Skull base and vertebrae: No fracture Soft tissues and spinal canal: Prevertebral soft tissues are normal. No epidural or paraspinal hematoma. Disc levels: Diffuse degenerative disc and facet disease throughout the cervical spine. Upper chest: Negative Other: Carotid artery calcifications again noted, unchanged. IMPRESSION: No acute intracranial abnormality. Atrophy, chronic microvascular disease. No acute bony abnormality in the cervical spine. Degenerative disc and facet disease. Electronically Signed   By: Rolm Baptise M.D.   On: 07/04/2016 15:14    Procedures Procedures (including critical care time)  Medications Ordered in ED Medications  ipratropium-albuterol (DUONEB) 0.5-2.5 (3) MG/3ML nebulizer solution 3 mL (3 mLs Nebulization Given 07/04/16 1531)     Initial Impression / Assessment and Plan / ED Course  I have reviewed the triage vital signs and the nursing notes.  Pertinent labs & imaging results that were available during my care of the patient were reviewed by me and considered in my medical decision making (see chart for details).    This is a 84 y.o. Male who presents to the emergency department after a slip and fall 2 days ago complaining of right rib pain. Patient reports he was getting out of bed 2 days ago and put his hand on a railing when it slipped causing him to fall onto his right side of his chest and  his head. He reports he has done this twice in the past week. His son is at bedside and reports that the railings are there to keep him from rolling out of bed. They're not designed to hold his weight. Patient reports he last fell 2 days ago. He reports pain to his right side of his ribs. He also reports hitting his head and denies loss of consciousness. He reports a minor  headache. He does not take anticoagulants. Son reports that he chronically has some wheezing and takes albuterol as needed.   On exam patient is afebrile and nontoxic appearing. His slight scattered wheezing noted bilaterally. No increased work of breathing. His right-sided chest wall is tender to palpation. No crepitus, deformity or ecchymosis. He has no focal neurological deficits.  X-ray of his right RIBS with chest shows fractures of his right fifth, sixth and seventh ribs. No pneumothorax or pleural effusion. Obtain CT chest with contrast. Plan for admission.  CT head and cervical spine show no acute findings.  At shift change patient is awaiting blood work and CT chest. Patient care signed out to Quincy Carnes, PA-C at shift change. She will disposition the patient accordingly after imaging results.   This patient was discussed with Dr. Sherry Ruffing who agrees with assessment and plan.   Final Clinical Impressions(s) / ED Diagnoses   Final diagnoses:  Multiple fractures of ribs, right side, initial encounter for closed fracture  Fall, initial encounter  Minor head injury, initial encounter    New Prescriptions New Prescriptions   No medications on file     Ernest Pean, PA-C 07/04/16 1600    Tegeler, Gwenyth Allegra, MD 07/10/16 [phone]

## 2016-07-04 NOTE — ED Triage Notes (Signed)
Pt. Stated, we have this railing for our bed, and I've fell out of be twice. Injured rt. Arm and shoulder and underneath rt. Arm pit.

## 2016-07-04 NOTE — ED Notes (Signed)
ED Provider at bedside. 

## 2016-07-04 NOTE — ED Notes (Signed)
Pt oxygen saturations to be 88% with good pleth. Placed pt on 2L [place] and saturations improved to 95%. Will cont to monitor.

## 2016-07-04 NOTE — H&P (Signed)
History and Physical  Patient Name: Ernest Clark.     PJA:[id number]    DOB: July 06, 1931    DOA: 07/04/2016 PCP: Fulton Reek, MD  Patient coming from: Home  Chief Complaint: Rib pain, weakness      HPI: Ernest Clark. is a 83 y.o. male with a past medical history significant for CAD s/p PCI in '94. COPD, IDDM, HTN, and PVD who presents with fall and rib pain.  The patient was in his usual state of health (he tells me he still drives, doesn't use a cane, no dementia) until about a week or so ago when he started to feel weaker, need to start using a walker he had in the house because of weakness.  Then two nights ago, he woke up to pee, and fell (in the dark he felt disoriented, and missed the railing on his bed? He is vague), landed on his right side, hurt his right ribs.  Since then, he has had a lot of pain, had difficulty caring for himself at home, so today his son brought him to the ER.  He has right rib pain and generalized weakness, no confusion, seizures, no cough, sputum production, fever.  ED course: -Afebrile, heart rate 101, respirations normal, pulse ox variating between mid-90s and upper 80s, blood pressure 138/72 -Na 127, K 4.7, Cr 1.01 (baseline 1.1), WBC 14.3K, Hgb 13 -Rib x-ray showed right 5-7 rib fractures -CT head and c-spine normal -CT chest showed above mentioned rib fractures, incidental focal sclerotic lesion in thoracic spine (nonspecific) and incidental lung nodule -Case was discussed with Trauma on call who had no particular recommendations and TRH were asked to evaluate for multiple rib fractures and hyponatremia   The patient has no new medications.  He is back on furosemide for leg swelling over the last 2 months, takes 1-2 times daily (no recent change in dose).  No echocardiogram in our system to suggest CHF, no previous admissions here for CHF.       ROS: Review of Systems  Constitutional: Negative for chills and fever.  Respiratory:  Positive for cough.   Cardiovascular: Negative for chest pain.  Musculoskeletal: Positive for falls.  Neurological: Positive for weakness. Negative for dizziness, sensory change, speech change, focal weakness, seizures, loss of consciousness and headaches.  All other systems reviewed and are negative.         Past Medical History:  Diagnosis Date  . COPD (chronic obstructive pulmonary disease) ([place])   . Coronary heart disease    remote PCI  . Diabetes mellitus   . DVT (deep venous thrombosis) ([place]) 2011  . Gout   . Hyperlipidemia   . Hypertension   . Peripheral arterial disease [facility])     Past Surgical History:  Procedure Laterality Date  . CARDIAC CATHETERIZATION  1994   w/ PTCA   . COLONOSCOPY     w/ polyp resection  . TONSILLECTOMY  as a child    Social History: Patient lives with his ex-wife.  He still drives, usually walks without a cane. Denies dementia. 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  Former heavy smoker.  Former alcohol user.  Allergies  Allergen Reactions  . Actos [Pioglitazone Hydrochloride] Other (See Comments)    RIGHT LEG SWELLING   . Metformin And Related Other (See Comments)    Family history: family history includes Diabetes in his brother, brother, father, and mother.  Prior to Admission medications   Medication Sig Start Date End Date Taking? Authorizing  Provider  acetaminophen (TYLENOL) 500 MG tablet Take 1,000 mg by mouth every 6 (six) hours as needed.   Yes [provider]  albuterol (PROAIR HFA) 108 (90 BASE) MCG/ACT inhaler Inhale 2 puffs into the lungs every 6 (six) hours as needed for wheezing or shortness of breath.    Yes [provider]  allopurinol (ZYLOPRIM) 100 MG tablet TAKE 1 TABLET (100MG ) DAILY 10/13/14  Yes [provider]  aspirin 81 MG tablet Take 81 mg by mouth daily.    Yes [provider]  cetirizine (ZYRTEC) 10 MG tablet Take 10 mg by mouth daily as needed for allergies.   Yes [provider]  docusate sodium (STOOL SOFTENER) 100 MG capsule Take 100 mg by mouth 2 (two) times daily.   Yes [provider]  DULoxetine (CYMBALTA) 30 MG capsule Take 30 mg by mouth daily.   Yes [provider]  Ferrous Sulfate (IRON) 325 (65 Fe) MG TABS Take 325 mg by mouth daily.   Yes [provider]  furosemide (LASIX) 40 MG tablet TAKE 1 TABLET (40MG ) BY MOUTH DAILY 04/09/15  Yes [provider]  Insulin Degludec (TRESIBA FLEXTOUCH [place]) Inject 26 Units into the skin every morning.   Yes [provider]  insulin lispro (HUMALOG) 100 UNIT/ML injection Inject 8 Units into the skin daily.   Yes [provider]  Melatonin 10 MG TABS Take 10 mg by mouth at bedtime.   Yes [provider]  Mometasone Furo-Formoterol Fum (DULERA) 200-5 MCG/ACT AERO Inhale 2 puffs into the lungs 2 (two) times daily. 02/23/11  Yes Tanda Rockers, MD  olmesartan (BENICAR) 40 MG tablet One half daily Patient taking differently: Take 20 mg by mouth daily.  01/26/11  Yes Tanda Rockers, MD  omeprazole (PRILOSEC) 20 MG capsule Take 20 mg by mouth 2 (two) times daily.   Yes [provider]  rosuvastatin (CRESTOR) 40 MG tablet Take 40 mg by mouth daily.     Yes [provider]  sitaGLIPtin (JANUVIA) 100 MG tablet Take 100 mg by mouth daily.   Yes [provider]  HYDROcodone-acetaminophen (NORCO/VICODIN) 5-325 MG tablet Take 1 tablet by mouth every 4 (four) hours as needed for moderate pain or severe pain. Patient not taking: Reported on 07/04/2016 03/07/15   Waynetta Pean, PA-C       Physical Exam: BP 111/79   Pulse 99   Temp 97.9 F (36.6 C) (Oral)   Resp 17   Ht 5' 8.5" (1.74 m)   Wt 111.6 kg (246 lb)   SpO2 97%   BMI 36.86 kg/m  General appearance: Well-developed, elderly adult male, alert and in no acute distress, appears weak, debilitated.   Eyes: Anicteric, conjunctiva pink, lids and lashes normal. PERRL. Left  Ptosis. ENT: No nasal deformity, discharge, epistaxis.  Hearing poor, hearing aids in. OP moist without lesions.  Dentures upper. Neck: No neck masses.  Trachea midline.  No thyromegaly/tenderness. Lymph: No cervical or supraclavicular lymphadenopathy. Skin: Warm and dry.  No jaundice.  No suspicious rashes or lesions. Cardiac: Tachycardic, regular, nl S1-S2, no murmurs appreciated.  Capillary refill is brisk.  JVP not visible.  No LE edema.  Radial pulses 2+ and symmetric.  DP pulses diminished. Respiratory: Normal respiratory rate and rhythm.  CTAB without rales or wheezes. Abdomen: Abdomen soft.  No TTP. No ascites, distension, hepatosplenomegaly.   MSK: No deformities or effusions.  No cyanosis or clubbing. Neuro: Cranial nerves 3-12 intact except left ptosis.  Sensation intact to light touch. Speech is fluent.  Muscle strength 5/5 and symmetric.    Psych: Sensorium intact and responding to questions, attention normal.  Behavior appropriate.  Affect normal.  Judgment and insight appear normal.     Labs on Admission:  I have personally reviewed following labs and imaging studies: CBC:  Recent Labs Lab 07/04/16 1538 07/04/16 1548  WBC 14.3*  --   NEUTROABS 11.2*  --   HGB 13.0 14.3  HCT 39.3 42.0  MCV 88.5  --   PLT 284  --    Basic Metabolic Panel:  Recent Labs Lab 07/04/16 1538 07/04/16 1548  NA 127* 128*  K 4.7 4.7  CL 93* 96*  CO2 25  --   GLUCOSE 120* 122*  BUN 15 18  CREATININE 1.01 0.90  CALCIUM 9.2  --    GFR: Estimated Creatinine Clearance: 76 mL/min (by C-G formula based on SCr of 0.9 mg/dL).        Radiological Exams on Admission: Personally reviewed Rib x-ray report, CT chest and CT head, cspine reports: Dg Ribs Unilateral W/chest Right  Result Date: 07/04/2016 CLINICAL DATA:  Status post fall from bed twice this morning. Generalized right ribcage pain. EXAM: RIGHT RIBS AND CHEST - 3+ VIEW COMPARISON:  Chest x-ray of September 14, 2004 FINDINGS: The  lungs are adequately inflated. There is no focal infiltrate. There is no pneumothorax nor definite pleural effusion. The heart and pulmonary vascularity are normal. There is calcification in the wall of the aortic arch. Right rib detail images reveal minimally displaced fractures of the lateral aspects of the right fifth, sixth, and seventh ribs. IMPRESSION: Fractures of the lateral aspects of the right fifth through seventh ribs. No pneumothorax nor significant pleural effusion. Mild chronic bronchitic changes. Thoracic aortic atherosclerosis. Electronically Signed   By: David  Martinique M.D.   On: 07/04/2016 14:54   Ct Head Wo Contrast  Result Date: 07/04/2016 CLINICAL DATA:  Fall, hit head. EXAM: CT HEAD WITHOUT CONTRAST CT CERVICAL SPINE WITHOUT CONTRAST TECHNIQUE: Multidetector CT imaging of the head and cervical spine was performed following the standard protocol without intravenous contrast. Multiplanar CT image reconstructions of the cervical spine were also generated. COMPARISON:  03/07/2015 FINDINGS: CT HEAD FINDINGS Brain: There is atrophy and chronic small vessel disease changes. No acute intracranial abnormality. Specifically, no hemorrhage, hydrocephalus, mass lesion, acute infarction, or significant intracranial injury. Vascular: No hyperdense vessel or unexpected calcification. Skull: No acute calvarial abnormality. Sinuses/Orbits: Visualized paranasal sinuses and mastoids clear. Orbital soft tissues unremarkable. Other: None CT CERVICAL SPINE FINDINGS Alignment: Normal Skull base and vertebrae: No fracture Soft tissues and spinal canal: Prevertebral soft tissues are normal. No epidural or paraspinal hematoma. Disc levels: Diffuse degenerative disc and facet disease throughout the cervical spine. Upper chest: Negative Other: Carotid artery calcifications again noted, unchanged. IMPRESSION: No acute intracranial abnormality. Atrophy, chronic microvascular disease. No acute bony abnormality in the  cervical spine. Degenerative disc and facet disease. Electronically Signed   By: Rolm Baptise M.D.   On: 07/04/2016 15:14   Ct Chest W Contrast  Result Date: 07/04/2016 CLINICAL DATA:  83 year old hypertensive male post fall getting out of bed 2 days ago hitting right-side of chest. Shortness breath. Subsequent encounter. EXAM: CT CHEST WITH CONTRAST TECHNIQUE: Multidetector CT imaging of the chest was performed during intravenous contrast administration. CONTRAST:  15mL ISOVUE-300 IOPAMIDOL (ISOVUE-300) INJECTION 61% COMPARISON:  07/04/2016 chest x-ray. FINDINGS: Cardiovascular: No pulmonary embolus. No aortic dissection. Ascending thoracic aorta measures up to  4.1 cm. Heart size within normal limits. Prominent coronary artery calcification. Calcification aortic root. Mediastinum/Nodes: Top-normal size pretracheal lymph node. Lungs/Pleura: No pneumothorax. Scattered atelectatic changes/scarring. 4 mm right apical nodule (series 4 image 24). Trachea and mainstem bronchi are patent. Upper Abdomen: Elevated right hemidiaphragm. No acute upper abdominal abnormality noted. Gallstones. Right renal cyst. Musculoskeletal: Slightly displaced fracture anterolateral aspect right fifth and sixth rib. There may be a subtle buckle fracture of the right seventh rib. Broad-based lipoma right lateral chest wall. Sclerosis at the base of the T1 spinous process and left T1 lamina. Sclerotic metastatic disease not excluded in the proper clinical setting. No other sclerotic foci noted. Mild degenerative changes lower cervical spine and thoracic spine with mild curvature thoracic spine without focal compression fracture. IMPRESSION: Slightly displaced fracture anterolateral aspect right fifth and sixth rib. There may be a subtle buckle fracture of the right seventh rib. No pneumothorax. Basilar atelectatic changes/scarring. Broad-based lipoma right lateral chest wall. Sclerosis at the base of the T1 spinous process and left T1  lamina. Sclerotic metastatic disease not excluded in the proper clinical setting. No other sclerotic foci noted. Mild degenerative changes lower cervical spine and thoracic spine with mild curvature thoracic spine without focal compression fracture. 4 mm right apical nodule (series 4 image 24). No follow-up needed if patient is low-risk. Non-contrast chest CT can be considered in 12 months if patient is high-risk. This recommendation follows the consensus statement: Guidelines for Management of Incidental Pulmonary Nodules Detected on CT Images: From the Fleischner Society 2017; Radiology 2017; 284:228-243. Aortic Atherosclerosis (ICD10-I70.0). Ascending thoracic aorta measures up to 4.1 cm. Prominent coronary artery calcification. Electronically Signed   By: Genia Del M.D.   On: 07/04/2016 17:14   Ct Cervical Spine Wo Contrast  Result Date: 07/04/2016 CLINICAL DATA:  Fall, hit head. EXAM: CT HEAD WITHOUT CONTRAST CT CERVICAL SPINE WITHOUT CONTRAST TECHNIQUE: Multidetector CT imaging of the head and cervical spine was performed following the standard protocol without intravenous contrast. Multiplanar CT image reconstructions of the cervical spine were also generated. COMPARISON:  03/07/2015 FINDINGS: CT HEAD FINDINGS Brain: There is atrophy and chronic small vessel disease changes. No acute intracranial abnormality. Specifically, no hemorrhage, hydrocephalus, mass lesion, acute infarction, or significant intracranial injury. Vascular: No hyperdense vessel or unexpected calcification. Skull: No acute calvarial abnormality. Sinuses/Orbits: Visualized paranasal sinuses and mastoids clear. Orbital soft tissues unremarkable. Other: None CT CERVICAL SPINE FINDINGS Alignment: Normal Skull base and vertebrae: No fracture Soft tissues and spinal canal: Prevertebral soft tissues are normal. No epidural or paraspinal hematoma. Disc levels: Diffuse degenerative disc and facet disease throughout the cervical spine. Upper  chest: Negative Other: Carotid artery calcifications again noted, unchanged. IMPRESSION: No acute intracranial abnormality. Atrophy, chronic microvascular disease. No acute bony abnormality in the cervical spine. Degenerative disc and facet disease. Electronically Signed   By: Rolm Baptise M.D.   On: 07/04/2016 15:14    EKG: Independently reviewed. Rate 108, QTc 425, LAFB, no ST changes.           Assessment/Plan  1. Multiple rib fractures:  From fall 2 days ago.   -Recommend observation 24 hours -Hydrocodone-acetaminophen for pain, step up if needed -IS every 4 hrs while awake -PT eval -CM consult for SNF placement vs HHPT (son asked that he be contacted re: options)  2. Hyponatremia:  Patient thinks he has been taking "salt tabs", son thinks patient is confused, only taking Lasix, restarted about 2 months ago for leg swelling. -Hold Lasix -Check urine  sodium tomorrow -Check free water clearance/osmolalities tomorrow -Gentle IV fluids overnight and repeat BMP -Duloxetine only weakly assoc with hypoNA, but will hold for now  3. CT chest incidental findings:  -For sclerotic bone lesion: check PSA, if negative, f/u lesion on CT chest in a few months -For lung nodule: repeat CT chest in 12 months  4. Leukocytosis:  Presumably this is reactive to #1, no focal infection identified.  5. Diabetes:  -Continue degludec -Hold Januvia -SSI with meals  6. Hypertesnion and CV secondary prevention:  -Hold furosemide -Continue Crestor -Continue aspirin -Continue Olmesartan  7. COPD:  -Continue Dulera  8. Other medications: -Continue allopurinol -Continue PPI -Continue iron         DVT prophylaxis: Lovenox  Code Status: FULL  Family Communication: Son by phone  Disposition Plan: Anticipate IV fluids overnight, check urine studies and repeat Na in AM.  For rib fractures: reassess pain control tomorrow, work with PT, likely home with HHPT tomorrow. Consults called:  None Admission status: OBS At the point of initial evaluation, it is my clinical opinion that admission for OBSERVATION is reasonable and necessary because the patient's presenting complaints in the context of their chronic conditions represent sufficient risk of deterioration or significant morbidity to constitute reasonable grounds for close observation in the hospital setting, but that the patient may be medically stable for discharge from the hospital within 24 to 48 hours.    Medical decision making: Patient seen at 7:50 PM on 07/04/2016.  The patient was discussed with Quincy Carnes, PA-C.  What exists of the patient's chart was reviewed in depth and summarized above.  Clinical condition: stable.        Edwin Dada [facility] Pager [phone]

## 2016-07-04 NOTE — ED Provider Notes (Signed)
Assumed care from PA Dansie at shift change.  See his note for full H&P.  Briefly, 83 y.o. M here after 2 falls, most recent was 2 days ago.  Fell onto right ribs and now with right rib pain.  Some wheezing on exam.  3 rib fxs on plain films.  Plan:  CT chest w/contrast, labs.  May need to consult trauma.  Results for orders placed or performed during the hospital encounter of 83/15/17  Basic metabolic panel  Result Value Ref Range   Sodium 127 (L) 135 - 145 mmol/L   Potassium 4.7 3.5 - 5.1 mmol/L   Chloride 93 (L) 101 - 111 mmol/L   CO2 25 22 - 32 mmol/L   Glucose, Bld 120 (H) 65 - 99 mg/dL   BUN 15 6 - 20 mg/dL   Creatinine, Ser 1.01 0.61 - 1.24 mg/dL   Calcium 9.2 8.9 - 10.3 mg/dL   GFR calc non Af Amer >60 >60 mL/min   GFR calc Af Amer >60 >60 mL/min   Anion gap 9 5 - 15  CBC with Differential  Result Value Ref Range   WBC 14.3 (H) 4.0 - 10.5 K/uL   RBC 4.44 4.22 - 5.81 MIL/uL   Hemoglobin 13.0 13.0 - 17.0 g/dL   HCT 39.3 39.0 - 52.0 %   MCV 88.5 78.0 - 100.0 fL   MCH 29.3 26.0 - 34.0 pg   MCHC 33.1 30.0 - 36.0 g/dL   RDW 14.0 11.5 - 15.5 %   Platelets 284 150 - 400 K/uL   Neutrophils Relative % 79 %   Neutro Abs 11.2 (H) 1.7 - 7.7 K/uL   Lymphocytes Relative 13 %   Lymphs Abs 1.8 0.7 - 4.0 K/uL   Monocytes Relative 7 %   Monocytes Absolute 1.0 0.1 - 1.0 K/uL   Eosinophils Relative 1 %   Eosinophils Absolute 0.2 0.0 - 0.7 K/uL   Basophils Relative 0 %   Basophils Absolute 0.1 0.0 - 0.1 K/uL  I-stat Chem 8, ED  Result Value Ref Range   Sodium 128 (L) 135 - 145 mmol/L   Potassium 4.7 3.5 - 5.1 mmol/L   Chloride 96 (L) 101 - 111 mmol/L   BUN 18 6 - 20 mg/dL   Creatinine, Ser 0.90 0.61 - 1.24 mg/dL   Glucose, Bld 122 (H) 65 - 99 mg/dL   Calcium, Ion 0.98 (L) 1.15 - 1.40 mmol/L   TCO2 23 0 - 100 mmol/L   Hemoglobin 14.3 13.0 - 17.0 g/dL   HCT 42.0 39.0 - 52.0 %   Dg Ribs Unilateral W/chest Right  Result Date: 07/04/2016 CLINICAL DATA:  Status post fall from bed  twice this morning. Generalized right ribcage pain. EXAM: RIGHT RIBS AND CHEST - 3+ VIEW COMPARISON:  Chest x-ray of September 14, 2004 FINDINGS: The lungs are adequately inflated. There is no focal infiltrate. There is no pneumothorax nor definite pleural effusion. The heart and pulmonary vascularity are normal. There is calcification in the wall of the aortic arch. Right rib detail images reveal minimally displaced fractures of the lateral aspects of the right fifth, sixth, and seventh ribs. IMPRESSION: Fractures of the lateral aspects of the right fifth through seventh ribs. No pneumothorax nor significant pleural effusion. Mild chronic bronchitic changes. Thoracic aortic atherosclerosis. Electronically Signed   By: David  Martinique M.D.   On: 07/04/2016 14:54   Ct Head Wo Contrast  Result Date: 07/04/2016 CLINICAL DATA:  Fall, hit head. EXAM: CT HEAD WITHOUT CONTRAST  CT CERVICAL SPINE WITHOUT CONTRAST TECHNIQUE: Multidetector CT imaging of the head and cervical spine was performed following the standard protocol without intravenous contrast. Multiplanar CT image reconstructions of the cervical spine were also generated. COMPARISON:  03/07/2015 FINDINGS: CT HEAD FINDINGS Brain: There is atrophy and chronic small vessel disease changes. No acute intracranial abnormality. Specifically, no hemorrhage, hydrocephalus, mass lesion, acute infarction, or significant intracranial injury. Vascular: No hyperdense vessel or unexpected calcification. Skull: No acute calvarial abnormality. Sinuses/Orbits: Visualized paranasal sinuses and mastoids clear. Orbital soft tissues unremarkable. Other: None CT CERVICAL SPINE FINDINGS Alignment: Normal Skull base and vertebrae: No fracture Soft tissues and spinal canal: Prevertebral soft tissues are normal. No epidural or paraspinal hematoma. Disc levels: Diffuse degenerative disc and facet disease throughout the cervical spine. Upper chest: Negative Other: Carotid artery  calcifications again noted, unchanged. IMPRESSION: No acute intracranial abnormality. Atrophy, chronic microvascular disease. No acute bony abnormality in the cervical spine. Degenerative disc and facet disease. Electronically Signed   By: Rolm Baptise M.D.   On: 07/04/2016 15:14   Ct Chest W Contrast  Result Date: 07/04/2016 CLINICAL DATA:  83 year old hypertensive male post fall getting out of bed 2 days ago hitting right-side of chest. Shortness breath. Subsequent encounter. EXAM: CT CHEST WITH CONTRAST TECHNIQUE: Multidetector CT imaging of the chest was performed during intravenous contrast administration. CONTRAST:  39mL ISOVUE-300 IOPAMIDOL (ISOVUE-300) INJECTION 61% COMPARISON:  07/04/2016 chest x-ray. FINDINGS: Cardiovascular: No pulmonary embolus. No aortic dissection. Ascending thoracic aorta measures up to 4.1 cm. Heart size within normal limits. Prominent coronary artery calcification. Calcification aortic root. Mediastinum/Nodes: Top-normal size pretracheal lymph node. Lungs/Pleura: No pneumothorax. Scattered atelectatic changes/scarring. 4 mm right apical nodule (series 4 image 24). Trachea and mainstem bronchi are patent. Upper Abdomen: Elevated right hemidiaphragm. No acute upper abdominal abnormality noted. Gallstones. Right renal cyst. Musculoskeletal: Slightly displaced fracture anterolateral aspect right fifth and sixth rib. There may be a subtle buckle fracture of the right seventh rib. Broad-based lipoma right lateral chest wall. Sclerosis at the base of the T1 spinous process and left T1 lamina. Sclerotic metastatic disease not excluded in the proper clinical setting. No other sclerotic foci noted. Mild degenerative changes lower cervical spine and thoracic spine with mild curvature thoracic spine without focal compression fracture. IMPRESSION: Slightly displaced fracture anterolateral aspect right fifth and sixth rib. There may be a subtle buckle fracture of the right seventh rib. No  pneumothorax. Basilar atelectatic changes/scarring. Broad-based lipoma right lateral chest wall. Sclerosis at the base of the T1 spinous process and left T1 lamina. Sclerotic metastatic disease not excluded in the proper clinical setting. No other sclerotic foci noted. Mild degenerative changes lower cervical spine and thoracic spine with mild curvature thoracic spine without focal compression fracture. 4 mm right apical nodule (series 4 image 24). No follow-up needed if patient is low-risk. Non-contrast chest CT can be considered in 12 months if patient is high-risk. This recommendation follows the consensus statement: Guidelines for Management of Incidental Pulmonary Nodules Detected on CT Images: From the Fleischner Society 2017; Radiology 2017; 284:228-243. Aortic Atherosclerosis (ICD10-I70.0). Ascending thoracic aorta measures up to 4.1 cm. Prominent coronary artery calcification. Electronically Signed   By: Genia Del M.D.   On: 07/04/2016 17:14   Ct Cervical Spine Wo Contrast  Result Date: 07/04/2016 CLINICAL DATA:  Fall, hit head. EXAM: CT HEAD WITHOUT CONTRAST CT CERVICAL SPINE WITHOUT CONTRAST TECHNIQUE: Multidetector CT imaging of the head and cervical spine was performed following the standard protocol without intravenous contrast. Multiplanar CT  image reconstructions of the cervical spine were also generated. COMPARISON:  03/07/2015 FINDINGS: CT HEAD FINDINGS Brain: There is atrophy and chronic small vessel disease changes. No acute intracranial abnormality. Specifically, no hemorrhage, hydrocephalus, mass lesion, acute infarction, or significant intracranial injury. Vascular: No hyperdense vessel or unexpected calcification. Skull: No acute calvarial abnormality. Sinuses/Orbits: Visualized paranasal sinuses and mastoids clear. Orbital soft tissues unremarkable. Other: None CT CERVICAL SPINE FINDINGS Alignment: Normal Skull base and vertebrae: No fracture Soft tissues and spinal canal:  Prevertebral soft tissues are normal. No epidural or paraspinal hematoma. Disc levels: Diffuse degenerative disc and facet disease throughout the cervical spine. Upper chest: Negative Other: Carotid artery calcifications again noted, unchanged. IMPRESSION: No acute intracranial abnormality. Atrophy, chronic microvascular disease. No acute bony abnormality in the cervical spine. Degenerative disc and facet disease. Electronically Signed   By: Rolm Baptise M.D.   On: 07/04/2016 15:14   Chest CT with evidence of right fifth and 6 rib fractures as well as questionable buckle fracture of the seventh. Patient vitals remained stable on room air, he is not hypoxic or tachypnea. He continues to have some wheezing. He does have history of COPD. Patient given additional neb. Will start incentive spirometry. We'll plan to ambulate with pulse ox.  6:13 PM Patient unable to get up and ambulate due to pain.  He generally ambulates with a walker.  Lung sounds clearing after neb.  Remains with normal oxygen saturations here.  Given that he cannot get up, feel he will need admission.  Discussed with trauma surgery, Dr. Kieth Brightly-- no acute intervention, agrees with pain control admission given his current state.    Will admit to medicine for ongoing care.     Larene Pickett, PA-C 07/05/16 Sylvester Harder    Duffy Bruce, MD 07/05/16 1131

## 2016-07-04 NOTE — ED Notes (Signed)
Assisted pt with using the restroom.

## 2016-07-04 NOTE — Progress Notes (Signed)
Received patient from Ed. Patient AOx4, VS stable with slightly elevated BP 144/60 and PR 102, O2Sat at 97% on RA and pain at 2/10 without movement.  Patient oriented to room, bed controls and call light.  Offered ice water to drink and patient resting on bed comfortably watching TV and call light within reach.  Will monitor.

## 2016-07-04 NOTE — ED Notes (Signed)
Pt was not able to assist in log rolling to change linens.

## 2016-07-05 DIAGNOSIS — W06XXXA Fall from bed, initial encounter: Secondary | ICD-10-CM | POA: Diagnosis present

## 2016-07-05 DIAGNOSIS — Z87891 Personal history of nicotine dependence: Secondary | ICD-10-CM | POA: Diagnosis not present

## 2016-07-05 DIAGNOSIS — R972 Elevated prostate specific antigen [PSA]: Secondary | ICD-10-CM | POA: Diagnosis present

## 2016-07-05 DIAGNOSIS — R911 Solitary pulmonary nodule: Secondary | ICD-10-CM | POA: Diagnosis present

## 2016-07-05 DIAGNOSIS — M109 Gout, unspecified: Secondary | ICD-10-CM | POA: Diagnosis present

## 2016-07-05 DIAGNOSIS — E1151 Type 2 diabetes mellitus with diabetic peripheral angiopathy without gangrene: Secondary | ICD-10-CM | POA: Diagnosis present

## 2016-07-05 DIAGNOSIS — Z794 Long term (current) use of insulin: Secondary | ICD-10-CM | POA: Diagnosis not present

## 2016-07-05 DIAGNOSIS — Z7982 Long term (current) use of aspirin: Secondary | ICD-10-CM | POA: Diagnosis not present

## 2016-07-05 DIAGNOSIS — R0781 Pleurodynia: Secondary | ICD-10-CM | POA: Diagnosis not present

## 2016-07-05 DIAGNOSIS — S2241XA Multiple fractures of ribs, right side, initial encounter for closed fracture: Secondary | ICD-10-CM | POA: Diagnosis present

## 2016-07-05 DIAGNOSIS — I2583 Coronary atherosclerosis due to lipid rich plaque: Secondary | ICD-10-CM

## 2016-07-05 DIAGNOSIS — E785 Hyperlipidemia, unspecified: Secondary | ICD-10-CM | POA: Diagnosis present

## 2016-07-05 DIAGNOSIS — R2681 Unsteadiness on feet: Secondary | ICD-10-CM | POA: Diagnosis not present

## 2016-07-05 DIAGNOSIS — J441 Chronic obstructive pulmonary disease with (acute) exacerbation: Secondary | ICD-10-CM | POA: Diagnosis present

## 2016-07-05 DIAGNOSIS — J449 Chronic obstructive pulmonary disease, unspecified: Secondary | ICD-10-CM | POA: Diagnosis not present

## 2016-07-05 DIAGNOSIS — I2510 Atherosclerotic heart disease of native coronary artery without angina pectoris: Secondary | ICD-10-CM | POA: Diagnosis not present

## 2016-07-05 DIAGNOSIS — I10 Essential (primary) hypertension: Secondary | ICD-10-CM

## 2016-07-05 DIAGNOSIS — R600 Localized edema: Secondary | ICD-10-CM | POA: Diagnosis present

## 2016-07-05 DIAGNOSIS — S2241XS Multiple fractures of ribs, right side, sequela: Secondary | ICD-10-CM | POA: Diagnosis not present

## 2016-07-05 DIAGNOSIS — M6281 Muscle weakness (generalized): Secondary | ICD-10-CM | POA: Diagnosis not present

## 2016-07-05 DIAGNOSIS — R278 Other lack of coordination: Secondary | ICD-10-CM | POA: Diagnosis not present

## 2016-07-05 DIAGNOSIS — D72829 Elevated white blood cell count, unspecified: Secondary | ICD-10-CM | POA: Diagnosis present

## 2016-07-05 DIAGNOSIS — Z79899 Other long term (current) drug therapy: Secondary | ICD-10-CM | POA: Diagnosis not present

## 2016-07-05 DIAGNOSIS — W19XXXA Unspecified fall, initial encounter: Secondary | ICD-10-CM

## 2016-07-05 DIAGNOSIS — S299XXA Unspecified injury of thorax, initial encounter: Secondary | ICD-10-CM | POA: Diagnosis not present

## 2016-07-05 DIAGNOSIS — Z86718 Personal history of other venous thrombosis and embolism: Secondary | ICD-10-CM | POA: Diagnosis not present

## 2016-07-05 DIAGNOSIS — E871 Hypo-osmolality and hyponatremia: Secondary | ICD-10-CM | POA: Diagnosis not present

## 2016-07-05 DIAGNOSIS — R41841 Cognitive communication deficit: Secondary | ICD-10-CM | POA: Diagnosis not present

## 2016-07-05 DIAGNOSIS — R0602 Shortness of breath: Secondary | ICD-10-CM | POA: Diagnosis not present

## 2016-07-05 DIAGNOSIS — K219 Gastro-esophageal reflux disease without esophagitis: Secondary | ICD-10-CM | POA: Diagnosis present

## 2016-07-05 DIAGNOSIS — Z9861 Coronary angioplasty status: Secondary | ICD-10-CM | POA: Diagnosis not present

## 2016-07-05 LAB — OSMOLALITY, URINE: Osmolality, Ur: 328 mOsm/kg (ref 300–900)

## 2016-07-05 LAB — BASIC METABOLIC PANEL
Anion gap: 6 (ref 5–15)
BUN: 13 mg/dL (ref 6–20)
CO2: 27 mmol/L (ref 22–32)
Calcium: 8.8 mg/dL — ABNORMAL LOW (ref 8.9–10.3)
Chloride: 99 mmol/L — ABNORMAL LOW (ref 101–111)
Creatinine, Ser: 0.88 mg/dL (ref 0.61–1.24)
GFR calc Af Amer: >60 mL/min (ref >60)
GFR calc non Af Amer: >60 mL/min (ref >60)
Glucose, Bld: 88 mg/dL (ref 65–99)
Potassium: 4.2 mmol/L (ref 3.5–5.1)
Sodium: 132 mmol/L — ABNORMAL LOW (ref 135–145)

## 2016-07-05 LAB — GLUCOSE, CAPILLARY
Glucose-Capillary: 87 mg/dL (ref 65–99)
Glucose-Capillary: 149 mg/dL — ABNORMAL HIGH (ref 65–99)
Glucose-Capillary: 130 mg/dL — ABNORMAL HIGH (ref 65–99)
Glucose-Capillary: 126 mg/dL — ABNORMAL HIGH (ref 65–99)

## 2016-07-05 LAB — SODIUM, URINE, RANDOM: Sodium, Ur: 36 mmol/L

## 2016-07-05 LAB — PSA: Prostatic Specific Antigen: 10.45 ng/mL — ABNORMAL HIGH (ref 0.00–4.00)

## 2016-07-05 LAB — OSMOLALITY: Osmolality: 275 mOsm/kg (ref 275–295)

## 2016-07-05 MED ORDER — GUAIFENESIN-DM 100-10 MG/5ML PO SYRP
5.0000 mL | ORAL_SOLUTION | ORAL | Status: DC | PRN
  Administered 2016-07-05 – 2016-07-07 (×4): 5 mL via ORAL
  Filled 2016-07-05 (×4): qty 5

## 2016-07-05 MED ORDER — LEVOTHYROXINE SODIUM 25 MCG PO TABS
25.0000 ug | ORAL_TABLET | Freq: Every day | ORAL | Status: DC
  Administered 2016-07-05 – 2016-07-08 (×4): 25 ug via ORAL
  Filled 2016-07-05 (×4): qty 1

## 2016-07-05 NOTE — Progress Notes (Signed)
Pt. seen for [facility] ordered BID, medication not found until after 2300 hrs., upon initial contact with pt. stated he "has been using ProAir inhaler at home 3X/day to help loosen mucus from lungs, and his Dulera twice a day as ordered, assessment done, pt. Sleeping once RT able to return for administration after locating medication.

## 2016-07-05 NOTE — Care Management Note (Addendum)
83 yo M with multiple rib fractures from fall 2 days ago. He is admitted under observation.  PT/OT is recommending HHPT/OT  Met with pt to discuss PT recommendations. He reports that he is planning to return home alone and he is able to manage. Discussed PT recommendations. He stated he needs to discuss [place] with his son Rodas). Asked pt if I could contact his son to discuss [facility] and he agreed.  Contacted pt's son and he stated that his father lives alone and he wants him to go to rehab. Informed son that the SW will assist them with the rehab facility.  Spoke with pt's nurse and explained the d/c plan to rehab.  Will continue to f/u to assist with the d/c plan if pt is not able to go to rehab.

## 2016-07-05 NOTE — Evaluation (Signed)
Physical Therapy Evaluation Patient Details Name: Ernest Clark. MRN: [id number] DOB: 1931/07/14 Today's Date: 07/05/2016   History of Present Illness  Ernest Clark. is a 83 y.o. male with a past medical history significant for CAD s/p PCI in '94. COPD, IDDM, HTN, and PVD who presents with fall and rib pain.  Clinical Impression  >pt admitted with above. Pt functioning at min guard level and is at increased falls risk. Due to rib and R flank pain pt requires assist for transfers and ADLs. Pt requires 24/7 assist for safe d/c home and would benefit from HHPT or outpt PT if he has a ride to improve strength and balance to decrease falls risk as patient has had several falls at home.    Follow Up Recommendations Home health PT;Supervision/Assistance - 24 hour (if patient has a ride would benfit from outpt PT)    Equipment Recommendations  None recommended by PT    Recommendations for Other Services       Precautions / Restrictions Precautions Precautions: Fall Precaution Comments: very HOH Restrictions Weight Bearing Restrictions: No Other Position/Activity Restrictions: limited UE ROM due to rib pain      Mobility  Bed Mobility Overal bed mobility: Needs Assistance Bed Mobility: Rolling;Sidelying to Sit Rolling: Supervision (used blankets to splint R flank) Sidelying to sit: Mod assist       General bed mobility comments: modA for trunk elevation due to pain  Transfers Overall transfer level: Needs assistance   Transfers: Sit to/from Stand Sit to Stand: Min assist         General transfer comment: v/c's to push up from bed but con't to pull up on walker, v/c's and tactile cues to reach back for chair  Ambulation/Gait Ambulation/Gait assistance: Min guard Ambulation Distance (Feet): 120 Feet Assistive device: Rolling walker (2 wheeled) Gait Pattern/deviations: Step-through pattern;Decreased stride length;Trunk flexed Gait velocity: slow Gait velocity  interpretation: Below normal speed for age/gender General Gait Details: pt with 1 standing rest break. pt requires RW for safe ambualtion, pt very unsteady without AD  Stairs Stairs:  (pt deferred due to fatigue and flank pain)          Wheelchair Mobility    Modified Rankin (Stroke Patients Only)       Balance Overall balance assessment: History of Falls                                           Pertinent Vitals/Pain Pain Assessment: 0-10 Pain Score: 7  Pain Location: R flank/ribs Pain Descriptors / Indicators: Jabbing Pain Intervention(s): Monitored during session (educated on using blanket/pillow to splint)    Home Living Family/patient expects to be discharged to:: Private residence Living Arrangements: Spouse/significant other (ex-wife who has dementia) Available Help at Discharge: Family;Personal care attendant;Available 24 hours/day (3 people who provide 24/7) Type of Home: House Home Access: Stairs to enter Entrance Stairs-Rails: Can reach both Entrance Stairs-Number of Steps: 4 Home Layout: One level Home Equipment: Walker - 2 wheels;Cane - single point;Bedside commode;Shower seat;Grab bars - toilet;Grab bars - tub/shower      Prior Function Level of Independence: Independent         Comments: was indep, driving, used cane occasionally but recently started using walker     Hand Dominance   Dominant Hand: Right    Extremity/Trunk Assessment   Upper Extremity Assessment Upper Extremity Assessment:  Generalized weakness (limited by flank/rib pain)    Lower Extremity Assessment Lower Extremity Assessment: Generalized weakness (pt reports having claudications in arteries in both LEs)    Cervical / Trunk Assessment Cervical / Trunk Assessment: Normal  Communication   Communication: HOH  Cognition Arousal/Alertness: Awake/alert Behavior During Therapy: WFL for tasks assessed/performed Overall Cognitive Status: No  family/caregiver present to determine baseline cognitive functioning                                 General Comments: pt with delayed comprehension but could be from [facility], decreased safety awareness      General Comments General comments (skin integrity, edema, etc.): R LE with mild edema    Exercises     Assessment/Plan    PT Assessment Patient needs continued PT services  PT Problem List Decreased strength;Decreased range of motion;Decreased activity tolerance;Decreased balance;Decreased mobility;Decreased knowledge of use of DME;Decreased safety awareness;Decreased cognition;Pain       PT Treatment Interventions DME instruction;Gait training;Stair training;Functional mobility training;Therapeutic activities;Therapeutic exercise;Balance training;Neuromuscular re-education    PT Goals (Current goals can be found in the Care Plan section)  Acute Rehab PT Goals Patient Stated Goal: get my equilibrium back PT Goal Formulation: With patient Time For Goal Achievement: 07/19/16 Potential to Achieve Goals: Fair    Frequency Min 3X/week   Barriers to discharge Decreased caregiver support unsure if pt would also have 24/7 or if help is exclusively for ex-wife    Co-evaluation               AM-PAC PT "6 Clicks" Daily Activity  Outcome Measure Difficulty turning over in bed (including adjusting bedclothes, sheets and blankets)?: A Lot Difficulty moving from lying on back to sitting on the side of the bed? : A Lot Difficulty sitting down on and standing up from a chair with arms (e.g., wheelchair, bedside commode, etc,.)?: A Little Help needed moving to and from a bed to chair (including a wheelchair)?: A Little Help needed walking in hospital room?: A Little Help needed climbing 3-5 steps with a railing? : A Lot 6 Click Score: 15    End of Session Equipment Utilized During Treatment: Gait belt Activity Tolerance: Patient tolerated treatment well Patient  left: in chair;with call bell/phone within reach;with chair alarm set Nurse Communication: Mobility status PT Visit Diagnosis: Unsteadiness on feet (R26.81);Pain;Difficulty in walking, not elsewhere classified (R26.2);History of falling (Z91.81) Pain - part of body:  (R flank)    Time: 1040-1107 PT Time Calculation (min) (ACUTE ONLY): 27 min   Charges:   PT Evaluation $PT Eval Moderate Complexity: 1 Procedure PT Treatments $Gait Training: 8-22 mins   PT G Codes:   PT G-Codes **NOT FOR INPATIENT CLASS** Functional Assessment Tool Used: Clinical judgement Functional Limitation: Mobility: Walking and moving around Mobility: Walking and Moving Around Current Status (K8127): At least 40 percent but less than 60 percent impaired, limited or restricted Mobility: Walking and Moving Around Goal Status [phone]): At least 1 percent but less than 20 percent impaired, limited or restricted    Kittie Plater, PT, DPT Pager #: [phone] Office #: [phone]   [place] 07/05/2016, 11:26 AM

## 2016-07-05 NOTE — Evaluation (Signed)
Occupational Therapy Evaluation Patient Details Name: Ernest Clark. MRN: [id number] DOB: March 20, 1931 Today's Date: 07/05/2016    History of Present Illness Ernest Clark. is a 83 y.o. male with a past medical history significant for CAD s/p PCI in '94. COPD, IDDM, HTN, and PVD who presents with fall and rib pain.   Clinical Impression   Pt reports he was managing BADL independently PTA; aide was assisting with IADL. Currently pt requires min-mod assist for functional mobility and ADL. Pt presenting with pain, decreased safety awareness, impaired balance, generalized weakness/UE ROM due to pain, and hx of falls impacting his independence and safety with ADL and functional mobility. Pt planning to d/c home with 24/7 supervision from Ross and family. Recommending HHOT for follow up to maximize independence and safety with ADL and functional mobility upon return home. Pt would benefit from continued skilled OT to address established goals.    Follow Up Recommendations  Home health OT;Supervision/Assistance - 24 hour    Equipment Recommendations  None recommended by OT    Recommendations for Other Services       Precautions / Restrictions Precautions Precautions: Fall Precaution Comments: very HOH Restrictions Weight Bearing Restrictions: No Other Position/Activity Restrictions: limited UE ROM due to rib pain      Mobility Bed Mobility        General bed mobility comments: Pt OOB in chair upon arrival  Transfers Overall transfer level: Needs assistance Equipment used: Rolling walker (2 wheeled) Transfers: Sit to/from Stand Sit to Stand: Mod assist;Min assist         General transfer comment: Cues for hand placement and technique. Pt bracing with pillow along R side. Min assist from chair, mod assist from toilet    Balance Overall balance assessment: Needs assistance;History of Falls Sitting-balance support: Feet supported;No upper extremity supported Sitting  balance-Leahy Scale: Fair     Standing balance support: No upper extremity supported;During functional activity Standing balance-Leahy Scale: Fair Standing balance comment: for static standing                           ADL either performed or assessed with clinical judgement   ADL Overall ADL's : Needs assistance/impaired Eating/Feeding: Set up;Sitting   Grooming: Set up;Supervision/safety;Sitting   Upper Body Bathing: Minimal assistance;Sitting   Lower Body Bathing: Moderate assistance;Sit to/from stand   Upper Body Dressing : Minimal assistance;Sitting   Lower Body Dressing: Moderate assistance;Sit to/from stand   Toilet Transfer: Minimal assistance;Moderate assistance;Ambulation;Comfort height toilet;RW Armed forces technical officer Details (indicate cue type and reason): Mod assist for sit to stand from toilet, min assist for mobility to bathroom Toileting- Clothing Manipulation and Hygiene: Set up;Min guard;Sitting/lateral lean Toileting - Clothing Manipulation Details (indicate cue type and reason): for peri care     Functional mobility during ADLs: Minimal assistance;Rolling walker General ADL Comments: DOE 2/4 with mobility; educated on pursed lip breathing     Vision         Perception     Praxis      Pertinent Vitals/Pain Pain Assessment: Faces Pain Score: 7  Faces Pain Scale: Hurts even more Pain Location: R ribs Pain Descriptors / Indicators: Jabbing;Sharp Pain Intervention(s): Monitored during session     Hand Dominance Right   Extremity/Trunk Assessment Upper Extremity Assessment Upper Extremity Assessment: Generalized weakness (limited assessment due to pain)   Lower Extremity Assessment Lower Extremity Assessment: Defer to PT evaluation   Cervical / Trunk Assessment Cervical / Trunk  Assessment: Normal   Communication Communication Communication: HOH   Cognition Arousal/Alertness: Awake/alert Behavior During Therapy: WFL for tasks  assessed/performed Overall Cognitive Status: No family/caregiver present to determine baseline cognitive functioning                                 General Comments: pt with delayed comprehension but could be from [facility], decreased safety awareness   General Comments      Exercises     Shoulder Instructions      Home Living Family/patient expects to be discharged to:: Private residence Living Arrangements: Spouse/significant other (ex wife who has dementia) Available Help at Discharge: Family;Personal care attendant;Available 24 hours/day (3 people who provide 24/7) Type of Home: House Home Access: Stairs to enter [employer] of Steps: 4 Entrance Stairs-Rails: Can reach both Home Layout: One level     Bathroom Shower/Tub: Occupational psychologist: Handicapped height     Home Equipment: Environmental consultant - 2 wheels;Cane - single point;Bedside commode;Shower seat;Grab bars - toilet;Grab bars - tub/shower          Prior Functioning/Environment Level of Independence: Needs assistance  Gait / Transfers Assistance Needed: used cane for mobility occasionally, recently using RW ADL's / Homemaking Assistance Needed: pt completing BADL with mod I, assist with medication management and IADL           OT Problem List: Decreased strength;Decreased range of motion;Decreased activity tolerance;Impaired balance (sitting and/or standing);Decreased cognition;Decreased safety awareness;Decreased knowledge of use of DME or AE;Decreased knowledge of precautions;Cardiopulmonary status limiting activity;Obesity;Pain;Impaired UE functional use      OT Treatment/Interventions: Self-care/ADL training;Therapeutic exercise;Energy conservation;DME and/or AE instruction;Therapeutic activities;Patient/family education;Balance training;Cognitive remediation/compensation    OT Goals(Current goals can be found in the care plan section) Acute Rehab OT Goals Patient Stated Goal:  return home OT Goal Formulation: With patient Time For Goal Achievement: 07/19/16 Potential to Achieve Goals: Good ADL Goals Pt Will Perform Grooming: with supervision;standing Pt Will Perform Upper Body Bathing: with supervision;sitting Pt Will Perform Lower Body Bathing: with supervision;sit to/from stand Pt Will Transfer to Toilet: with supervision;ambulating;bedside commode (over toilet) Pt Will Perform Toileting - Clothing Manipulation and hygiene: with supervision;sit to/from stand Pt Will Perform Tub/Shower Transfer: Shower transfer;with supervision;ambulating;shower seat;rolling walker  OT Frequency: Min 2X/week   Barriers to D/C:            Co-evaluation              AM-PAC PT "6 Clicks" Daily Activity     Outcome Measure Help from another person eating meals?: None Help from another person taking care of personal grooming?: A Little Help from another person toileting, which includes using toliet, bedpan, or urinal?: A Lot Help from another person bathing (including washing, rinsing, drying)?: A Lot Help from another person to put on and taking off regular upper body clothing?: A Little Help from another person to put on and taking off regular lower body clothing?: A Lot 6 Click Score: 16   End of Session Equipment Utilized During Treatment: Rolling walker  Activity Tolerance: Patient tolerated treatment well Patient left: in chair;with call bell/phone within reach;with chair alarm set  OT Visit Diagnosis: Unsteadiness on feet (R26.81);Other abnormalities of gait and mobility (R26.89);History of falling (Z91.81);Pain Pain - Right/Left: Right Pain - part of body:  (side)                Time: 4196-2229 OT Time Calculation (min):  18 min Charges:  OT General Charges $OT Visit: 1 Procedure OT Evaluation $OT Eval Moderate Complexity: 1 Procedure G-Codes: OT G-codes **NOT FOR INPATIENT CLASS** Functional Assessment Tool Used: AM-PAC 6 Clicks Daily  Activity Functional Limitation: Self care Self Care Current Status (W0992): At least 40 percent but less than 60 percent impaired, limited or restricted Self Care Goal Status (T8004): At least 20 percent but less than 40 percent impaired, limited or restricted   Mel Almond A. Ulice Brilliant, M.S., OTR/L Pager: [place] 07/05/2016, 12:23 PM

## 2016-07-05 NOTE — Care Management Note (Signed)
[place] NOTIFICATION   Patient Details  Name: Ernest Clark. MRN: [id number] Date of Birth: July 27, 1931   Medicare Observation Status Notification Given:   yes    Norina Buzzard, RN 07/05/2016, 3:24 PM

## 2016-07-05 NOTE — Progress Notes (Signed)
[facility]                                                                              Patient Demographics  Ernest Clark, is a 83 y.o. male, DOB - Feb 28, 1931, JEH:[id number]  Admit date - 07/04/2016   Admitting Physician Edwin Dada, MD  Outpatient Primary MD for the patient is Little, Jeanella Craze, MD  Outpatient specialists:   LOS - 0  days   Medical records reviewed and are as summarized below:    Chief Complaint  Patient presents with  . Fall  . Rib Injury  . Shoulder Pain       Brief summary    83 y.o. male with a past medical history significant for CAD s/p PCI in '94. COPD, IDDM, HTN, and PVD  Presented with mechanical fall and rib pain. The patient reported that he was in his usual state of health until about a week ago when he started to feel weaker, 2 nights before admission he woke up to urinate, Ernest Clark on his bad and landed on his right side and hurt his right RIBS. Rib x-ray showed right 5-7 rib fractures. CT chest showed rib fractures, incidental focal sclerotic lesion in the thoracic spine, nonspecific and incidental lung nodule. Patient was admitted for multiple rib fractures and hyponatremia.   Assessment & Plan    Principal Problem:   Multiple rib fractures - From mechanical fall - Continue pain control, incentive spirometry - PT evaluation recommended home health PT although son requesting for skilled nursing facility placement  Active Problems: Hyponatremia - Improving, patient is taking Lasix which was started 2 months ago for leg swelling - Hold Lasix and Cymbalta, patient was placed on gentle IV fluid hydration -Presented with sodium of 127, improving to 132 today     COPD (chronic obstructive pulmonary disease) (HCC) - Currently stable, no wheezing, continued dulera  CT chest findings, pulmonary nodule - Repeat CT chest in 12 months - Mildly elevated PSA, 10.45, no prior PSA to compare with. Outpatient follow-up  with urology recommended     Type 2 diabetes mellitus without complication, with long-term current use of insulin (HCC) - Continue Lantus, sliding scale insulin - CBGs controlled, follow hemoglobin A1c  GERD - Continue PPI  History of gout - Continue allopurinol  Code Status: full  DVT Prophylaxis:  Lovenox Family Communication: Discussed in detail with the patient, all imaging results, lab results explained to the patient    Disposition Plan:   Time Spent in minutes  25 minutes  Procedures:    Consultants:     Antimicrobials:      Medications  Scheduled Meds: . albuterol  2.5 mg Nebulization BID  . allopurinol  100 mg Oral Daily  . aspirin EC  81 mg Oral Daily  . enoxaparin (LOVENOX) injection  40 mg Subcutaneous Q24H  . insulin aspart  0-15 Units Subcutaneous TID WC  . insulin aspart  0-5 Units Subcutaneous QHS  . insulin glargine  26 Units Subcutaneous q morning - 10a  . irbesartan  300 mg Oral Daily  . levothyroxine  25 mcg Oral QAC breakfast  .  mometasone-formoterol  2 puff Inhalation BID  . pantoprazole  40 mg Oral Daily  . rosuvastatin  40 mg Oral Daily   Continuous Infusions: . sodium chloride 75 mL/hr at 07/05/16 1122   PRN Meds:.HYDROcodone-acetaminophen, ondansetron **OR** ondansetron (ZOFRAN) IV   Antibiotics   Anti-infectives    None        Subjective:   Ernest Clark was seen and examined today.  Still having rib pain on deep breathing and coughing, 6/10.  Patient denies dizziness, abdominal pain, N/V/D/C, new weakness, numbess, tingling. No acute events overnight. No fevers or chills.   Objective:   Vitals:   07/04/16 2201 07/05/16 0035 07/05/16 0429 07/05/16 0802  BP: (!) 144/60  120/64   Pulse: (!) 102 83 98   Resp: 19  18   Temp: 98.1 F (36.7 C)  98.2 F (36.8 C)   TempSrc: Oral  Oral   SpO2: 97% 94% 98% 91%  Weight:      Height:        Intake/Output Summary (Last 24 hours) at 07/05/16 1420 Last data filed at  07/05/16 0934  Gross per 24 hour  Intake           1112.5 ml  Output              750 ml  Net            362.5 ml     Wt Readings from Last 3 Encounters:  07/04/16 111.6 kg (246 lb)  04/07/15 105.6 kg (232 lb 12.8 oz)  03/06/15 108.6 kg (239 lb 8 oz)     Exam  General: Alert and oriented x 3, NAD  Eyes: PERRLA, EOMI, Anicteric Sclera,  HEENT:  Atraumatic, normocephalic, normal oropharynx  Cardiovascular: S1 S2 auscultated, no rubs, murmurs or gallops. Regular rate and rhythm.  Respiratory: Clear to auscultation bilaterally, no wheezing, rales or rhonchi  Gastrointestinal: Soft, nontender, nondistended, + bowel sounds  Ext: no pedal edema bilaterally  Neuro: AAOx3, Cr N's II- XII. Strength 5/5 upper and lower extremities bilaterally, speech clear, sensations grossly intact  Musculoskeletal: No digital cyanosis, clubbing  Skin: No rashes  Psych: Normal affect and demeanor, alert and oriented x3    Data Reviewed:  I have personally reviewed following labs and imaging studies  Micro Results No results found for this or any previous visit (from the past 240 hour(s)).  Radiology Reports Dg Ribs Unilateral W/chest Right  Result Date: 07/04/2016 CLINICAL DATA:  Status post fall from bed twice this morning. Generalized right ribcage pain. EXAM: RIGHT RIBS AND CHEST - 3+ VIEW COMPARISON:  Chest x-ray of September 14, 2004 FINDINGS: The lungs are adequately inflated. There is no focal infiltrate. There is no pneumothorax nor definite pleural effusion. The heart and pulmonary vascularity are normal. There is calcification in the wall of the aortic arch. Right rib detail images reveal minimally displaced fractures of the lateral aspects of the right fifth, sixth, and seventh ribs. IMPRESSION: Fractures of the lateral aspects of the right fifth through seventh ribs. No pneumothorax nor significant pleural effusion. Mild chronic bronchitic changes. Thoracic aortic atherosclerosis.  Electronically Signed   By: David  Martinique M.D.   On: 07/04/2016 14:54   Ct Head Wo Contrast  Result Date: 07/04/2016 CLINICAL DATA:  Fall, hit head. EXAM: CT HEAD WITHOUT CONTRAST CT CERVICAL SPINE WITHOUT CONTRAST TECHNIQUE: Multidetector CT imaging of the head and cervical spine was performed following the standard protocol without intravenous contrast. Multiplanar CT image reconstructions of the  cervical spine were also generated. COMPARISON:  03/07/2015 FINDINGS: CT HEAD FINDINGS Brain: There is atrophy and chronic small vessel disease changes. No acute intracranial abnormality. Specifically, no hemorrhage, hydrocephalus, mass lesion, acute infarction, or significant intracranial injury. Vascular: No hyperdense vessel or unexpected calcification. Skull: No acute calvarial abnormality. Sinuses/Orbits: Visualized paranasal sinuses and mastoids clear. Orbital soft tissues unremarkable. Other: None CT CERVICAL SPINE FINDINGS Alignment: Normal Skull base and vertebrae: No fracture Soft tissues and spinal canal: Prevertebral soft tissues are normal. No epidural or paraspinal hematoma. Disc levels: Diffuse degenerative disc and facet disease throughout the cervical spine. Upper chest: Negative Other: Carotid artery calcifications again noted, unchanged. IMPRESSION: No acute intracranial abnormality. Atrophy, chronic microvascular disease. No acute bony abnormality in the cervical spine. Degenerative disc and facet disease. Electronically Signed   By: Rolm Baptise M.D.   On: 07/04/2016 15:14   Ct Chest W Contrast  Result Date: 07/04/2016 CLINICAL DATA:  83 year old hypertensive male post fall getting out of bed 2 days ago hitting right-side of chest. Shortness breath. Subsequent encounter. EXAM: CT CHEST WITH CONTRAST TECHNIQUE: Multidetector CT imaging of the chest was performed during intravenous contrast administration. CONTRAST:  64mL ISOVUE-300 IOPAMIDOL (ISOVUE-300) INJECTION 61% COMPARISON:  07/04/2016  chest x-ray. FINDINGS: Cardiovascular: No pulmonary embolus. No aortic dissection. Ascending thoracic aorta measures up to 4.1 cm. Heart size within normal limits. Prominent coronary artery calcification. Calcification aortic root. Mediastinum/Nodes: Top-normal size pretracheal lymph node. Lungs/Pleura: No pneumothorax. Scattered atelectatic changes/scarring. 4 mm right apical nodule (series 4 image 24). Trachea and mainstem bronchi are patent. Upper Abdomen: Elevated right hemidiaphragm. No acute upper abdominal abnormality noted. Gallstones. Right renal cyst. Musculoskeletal: Slightly displaced fracture anterolateral aspect right fifth and sixth rib. There may be a subtle buckle fracture of the right seventh rib. Broad-based lipoma right lateral chest wall. Sclerosis at the base of the T1 spinous process and left T1 lamina. Sclerotic metastatic disease not excluded in the proper clinical setting. No other sclerotic foci noted. Mild degenerative changes lower cervical spine and thoracic spine with mild curvature thoracic spine without focal compression fracture. IMPRESSION: Slightly displaced fracture anterolateral aspect right fifth and sixth rib. There may be a subtle buckle fracture of the right seventh rib. No pneumothorax. Basilar atelectatic changes/scarring. Broad-based lipoma right lateral chest wall. Sclerosis at the base of the T1 spinous process and left T1 lamina. Sclerotic metastatic disease not excluded in the proper clinical setting. No other sclerotic foci noted. Mild degenerative changes lower cervical spine and thoracic spine with mild curvature thoracic spine without focal compression fracture. 4 mm right apical nodule (series 4 image 24). No follow-up needed if patient is low-risk. Non-contrast chest CT can be considered in 12 months if patient is high-risk. This recommendation follows the consensus statement: Guidelines for Management of Incidental Pulmonary Nodules Detected on CT Images: From  the Fleischner Society 2017; Radiology 2017; 284:228-243. Aortic Atherosclerosis (ICD10-I70.0). Ascending thoracic aorta measures up to 4.1 cm. Prominent coronary artery calcification. Electronically Signed   By: Genia Del M.D.   On: 07/04/2016 17:14   Ct Cervical Spine Wo Contrast  Result Date: 07/04/2016 CLINICAL DATA:  Fall, hit head. EXAM: CT HEAD WITHOUT CONTRAST CT CERVICAL SPINE WITHOUT CONTRAST TECHNIQUE: Multidetector CT imaging of the head and cervical spine was performed following the standard protocol without intravenous contrast. Multiplanar CT image reconstructions of the cervical spine were also generated. COMPARISON:  03/07/2015 FINDINGS: CT HEAD FINDINGS Brain: There is atrophy and chronic small vessel disease changes. No acute intracranial abnormality.  Specifically, no hemorrhage, hydrocephalus, mass lesion, acute infarction, or significant intracranial injury. Vascular: No hyperdense vessel or unexpected calcification. Skull: No acute calvarial abnormality. Sinuses/Orbits: Visualized paranasal sinuses and mastoids clear. Orbital soft tissues unremarkable. Other: None CT CERVICAL SPINE FINDINGS Alignment: Normal Skull base and vertebrae: No fracture Soft tissues and spinal canal: Prevertebral soft tissues are normal. No epidural or paraspinal hematoma. Disc levels: Diffuse degenerative disc and facet disease throughout the cervical spine. Upper chest: Negative Other: Carotid artery calcifications again noted, unchanged. IMPRESSION: No acute intracranial abnormality. Atrophy, chronic microvascular disease. No acute bony abnormality in the cervical spine. Degenerative disc and facet disease. Electronically Signed   By: Rolm Baptise M.D.   On: 07/04/2016 15:14    Lab Data:  CBC:  Recent Labs Lab 07/04/16 1538 07/04/16 1548  WBC 14.3*  --   NEUTROABS 11.2*  --   HGB 13.0 14.3  HCT 39.3 42.0  MCV 88.5  --   PLT 284  --    Basic Metabolic Panel:  Recent Labs Lab 07/04/16 1538  07/04/16 1548 07/05/16 0705  NA 127* 128* 132*  K 4.7 4.7 4.2  CL 93* 96* 99*  CO2 25  --  27  GLUCOSE 120* 122* 88  BUN 15 18 13   CREATININE 1.01 0.90 0.88  CALCIUM 9.2  --  8.8*   GFR: Estimated Creatinine Clearance: 77.7 mL/min (by C-G formula based on SCr of 0.88 mg/dL). Liver Function Tests: No results for input(s): AST, ALT, ALKPHOS, BILITOT, PROT, ALBUMIN in the last 168 hours. No results for input(s): LIPASE, AMYLASE in the last 168 hours. No results for input(s): AMMONIA in the last 168 hours. Coagulation Profile: No results for input(s): INR, PROTIME in the last 168 hours. Cardiac Enzymes: No results for input(s): CKTOTAL, CKMB, CKMBINDEX, TROPONINI in the last 168 hours. BNP (last 3 results) No results for input(s): PROBNP in the last 8760 hours. HbA1C: No results for input(s): HGBA1C in the last 72 hours. CBG:  Recent Labs Lab 07/04/16 2213 07/05/16 0732 07/05/16 1250  GLUCAP 200* 87 149*   Lipid Profile: No results for input(s): CHOL, HDL, LDLCALC, TRIG, CHOLHDL, LDLDIRECT in the last 72 hours. Thyroid Function Tests: No results for input(s): TSH, T4TOTAL, FREET4, T3FREE, THYROIDAB in the last 72 hours. Anemia Panel: No results for input(s): VITAMINB12, FOLATE, FERRITIN, TIBC, IRON, RETICCTPCT in the last 72 hours. Urine analysis: No results found for: COLORURINE, APPEARANCEUR, LABSPEC, PHURINE, GLUCOSEU, HGBUR, BILIRUBINUR, KETONESUR, PROTEINUR, UROBILINOGEN, NITRITE, LEUKOCYTESUR   Ripudeep Rai M.D. [facility] 07/05/2016, 2:20 PM  Pager: [phone] Between 7am to 7pm - call Pager - [phone]  After 7pm go to [email or web address] - password [email or web address]  Call night coverage person covering after 7pm

## 2016-07-06 DIAGNOSIS — E871 Hypo-osmolality and hyponatremia: Secondary | ICD-10-CM

## 2016-07-06 LAB — BASIC METABOLIC PANEL
Anion gap: 6 (ref 5–15)
BUN: 12 mg/dL (ref 6–20)
CO2: 25 mmol/L (ref 22–32)
Calcium: 9.2 mg/dL (ref 8.9–10.3)
Chloride: 101 mmol/L (ref 101–111)
Creatinine, Ser: 0.86 mg/dL (ref 0.61–1.24)
GFR calc Af Amer: >60 mL/min (ref >60)
GFR calc non Af Amer: >60 mL/min (ref >60)
Glucose, Bld: 125 mg/dL — ABNORMAL HIGH (ref 65–99)
Potassium: 4.4 mmol/L (ref 3.5–5.1)
Sodium: 132 mmol/L — ABNORMAL LOW (ref 135–145)

## 2016-07-06 LAB — CBC
HCT: 39.9 % (ref 39.0–52.0)
Hemoglobin: 12.8 g/dL — ABNORMAL LOW (ref 13.0–17.0)
MCH: 28.6 pg (ref 26.0–34.0)
MCHC: 32.1 g/dL (ref 30.0–36.0)
MCV: 89.1 fL (ref 78.0–100.0)
Platelets: 301 10*3/uL (ref 150–400)
RBC: 4.48 MIL/uL (ref 4.22–5.81)
RDW: 14.2 % (ref 11.5–15.5)
WBC: 12.3 10*3/uL — ABNORMAL HIGH (ref 4.0–10.5)

## 2016-07-06 LAB — GLUCOSE, CAPILLARY
Glucose-Capillary: 124 mg/dL — ABNORMAL HIGH (ref 65–99)
Glucose-Capillary: 136 mg/dL — ABNORMAL HIGH (ref 65–99)
Glucose-Capillary: 110 mg/dL — ABNORMAL HIGH (ref 65–99)
Glucose-Capillary: 141 mg/dL — ABNORMAL HIGH (ref 65–99)

## 2016-07-06 LAB — HEMOGLOBIN A1C
Hgb A1c MFr Bld: 7.6 % — ABNORMAL HIGH (ref 4.8–5.6)
Mean Plasma Glucose: 171 mg/dL

## 2016-07-06 NOTE — Progress Notes (Signed)
[facility]                                                                              Patient Demographics  Ernest Clark, is a 83 y.o. male, DOB - 11-03-1931, HTD:[id number]  Admit date - 07/04/2016   Admitting Physician Ernest Dada, MD  Outpatient Primary MD for the patient is Ernest Clark, Ernest Craze, MD  Outpatient specialists:   LOS - 1  days   Medical records reviewed and are as summarized below:    Chief Complaint  Patient presents with  . Fall  . Rib Injury  . Shoulder Pain       Brief summary    83 y.o. male with a past medical history significant for CAD s/p PCI in '94. COPD, IDDM, HTN, and PVD  Presented with mechanical fall and rib pain. The patient reported that he was in his usual state of health until about a week ago when he started to feel weaker, 2 nights before admission he woke up to urinate, [name] on his bad and landed on his right side and hurt his right RIBS. Rib x-ray showed right 5-7 rib fractures. CT chest showed rib fractures, incidental focal sclerotic lesion in the thoracic spine, nonspecific and incidental lung nodule. Patient was admitted for multiple rib fractures and hyponatremia.   Assessment & Plan    Principal Problem:   Multiple rib fractures - From mechanical fall - Still having some pain, continue Norco for the pain - Patient interested in skilled nursing facility, social consult placed, called the patient's son however he did not pick up the phone    Active Problems: Hyponatremia - Improving, was on Lasix which was started 2 months ago for leg swelling.  - Sodium 127 on admission, improving to 132, continue gentle hydration    COPD (chronic obstructive pulmonary disease) (HCC) - No wheezing, continue dulera  CT chest findings, pulmonary nodule - Repeat CT chest in 12 months - Mildly elevated PSA, 10.45, no prior PSA to compare with.  -Outpatient follow-up with urology recommended    Type 2  diabetes mellitus without complication, with long-term current use of insulin (HCC) - CBG is fairly controlled inpatient  - However hemoglobin A1c 7.6, continue Lantus 26 units, NovoLog sliding scale insulin   GERD - Stable, continue PPI  History of gout -Stable, continue allopurinol  Code Status: full  DVT Prophylaxis:  Lovenox Family Communication: Discussed in detail with the patient, all imaging results, lab results explained to the patient . Called patient's son on the phone however he did not pick up   Disposition Plan: Skilled nursing facility once bed available  Time Spent in minutes  25 minutes  Procedures:    Consultants:     Antimicrobials:      Medications  Scheduled Meds: . albuterol  2.5 mg Nebulization BID  . allopurinol  100 mg Oral Daily  . aspirin EC  81 mg Oral Daily  . enoxaparin (LOVENOX) injection  40 mg Subcutaneous Q24H  . insulin aspart  0-15 Units Subcutaneous TID WC  . insulin aspart  0-5 Units Subcutaneous QHS  . insulin glargine  26 Units Subcutaneous q morning - 10a  . irbesartan  300 mg Oral Daily  . levothyroxine  25 mcg Oral QAC breakfast  . mometasone-formoterol  2 puff Inhalation BID  . pantoprazole  40 mg Oral Daily  . rosuvastatin  40 mg Oral Daily   Continuous Infusions: . sodium chloride 75 mL/hr at 07/06/16 0400   PRN Meds:.guaiFENesin-dextromethorphan, HYDROcodone-acetaminophen, ondansetron **OR** ondansetron (ZOFRAN) IV   Antibiotics   Anti-infectives    None        Subjective:   Ernest Clark was seen and examined today. Still having right-sided rib pain however is improving, intermittently 5/10. No fevers or chills.   Patient denies dizziness, abdominal pain, N/V/D/C, new weakness, numbess, tingling. No acute events overnight.   Objective:   Vitals:   07/05/16 2300 07/06/16 0621 07/06/16 0900 07/06/16 0903  BP: (!) 145/55 (!) 162/81    Pulse: 89 94    Resp: 17 17    Temp: 97.7 F (36.5 C) 98.4 F  (36.9 C)    TempSrc: Oral     SpO2: 95% 94% 96% 99%  Weight:      Height:        Intake/Output Summary (Last 24 hours) at 07/06/16 1123 Last data filed at 07/06/16 0900  Gross per 24 hour  Intake             1740 ml  Output             1650 ml  Net               90 ml     Wt Readings from Last 3 Encounters:  07/04/16 111.6 kg (246 lb)  04/07/15 105.6 kg (232 lb 12.8 oz)  03/06/15 108.6 kg (239 lb 8 oz)     Exam Physical Exam  General: Alert and oriented x 3, NAD  Eyes: PERRLA, EOMI  HEENT:  Atraumatic, normocephalic, normal oropharynx  Cardiovascular: S1 S2 auscultated, no rubs, murmurs or gallops. Regular rate and rhythm. No pedal edema b/l  Respiratory: Clear to auscultation bilaterally, no wheezing, rales or rhonchi, tender in the right chest wall  Gastrointestinal: Soft, nontender, nondistended, + bowel sounds  Ext: no pedal edema bilaterally  Neuro: no neurological deficits  Musculoskeletal: No digital cyanosis, clubbing  Skin: No rashes  Psych: Normal affect and demeanor, alert and oriented x3     Data Reviewed:  I have personally reviewed following labs and imaging studies  Micro Results No results found for this or any previous visit (from the past 240 hour(s)).  Radiology Reports Dg Ribs Unilateral W/chest Right  Result Date: 07/04/2016 CLINICAL DATA:  Status post fall from bed twice this morning. Generalized right ribcage pain. EXAM: RIGHT RIBS AND CHEST - 3+ VIEW COMPARISON:  Chest x-ray of September 14, 2004 FINDINGS: The lungs are adequately inflated. There is no focal infiltrate. There is no pneumothorax nor definite pleural effusion. The heart and pulmonary vascularity are normal. There is calcification in the wall of the aortic arch. Right rib detail images reveal minimally displaced fractures of the lateral aspects of the right fifth, sixth, and seventh ribs. IMPRESSION: Fractures of the lateral aspects of the right fifth through seventh  ribs. No pneumothorax nor significant pleural effusion. Mild chronic bronchitic changes. Thoracic aortic atherosclerosis. Electronically Signed   By: Ernest  Clark M.D.   On: 07/04/2016 14:54   Ct Head Wo Contrast  Result Date: 07/04/2016 CLINICAL DATA:  Fall, hit head. EXAM: CT HEAD WITHOUT CONTRAST CT CERVICAL  SPINE WITHOUT CONTRAST TECHNIQUE: Multidetector CT imaging of the head and cervical spine was performed following the standard protocol without intravenous contrast. Multiplanar CT image reconstructions of the cervical spine were also generated. COMPARISON:  03/07/2015 FINDINGS: CT HEAD FINDINGS Brain: There is atrophy and chronic small vessel disease changes. No acute intracranial abnormality. Specifically, no hemorrhage, hydrocephalus, mass lesion, acute infarction, or significant intracranial injury. Vascular: No hyperdense vessel or unexpected calcification. Skull: No acute calvarial abnormality. Sinuses/Orbits: Visualized paranasal sinuses and mastoids clear. Orbital soft tissues unremarkable. Other: None CT CERVICAL SPINE FINDINGS Alignment: Normal Skull base and vertebrae: No fracture Soft tissues and spinal canal: Prevertebral soft tissues are normal. No epidural or paraspinal hematoma. Disc levels: Diffuse degenerative disc and facet disease throughout the cervical spine. Upper chest: Negative Other: Carotid artery calcifications again noted, unchanged. IMPRESSION: No acute intracranial abnormality. Atrophy, chronic microvascular disease. No acute bony abnormality in the cervical spine. Degenerative disc and facet disease. Electronically Signed   By: Rolm Baptise M.D.   On: 07/04/2016 15:14   Ct Chest W Contrast  Result Date: 07/04/2016 CLINICAL DATA:  83 year old hypertensive male post fall getting out of bed 2 days ago hitting right-side of chest. Shortness breath. Subsequent encounter. EXAM: CT CHEST WITH CONTRAST TECHNIQUE: Multidetector CT imaging of the chest was performed during  intravenous contrast administration. CONTRAST:  2mL ISOVUE-300 IOPAMIDOL (ISOVUE-300) INJECTION 61% COMPARISON:  07/04/2016 chest x-ray. FINDINGS: Cardiovascular: No pulmonary embolus. No aortic dissection. Ascending thoracic aorta measures up to 4.1 cm. Heart size within normal limits. Prominent coronary artery calcification. Calcification aortic root. Mediastinum/Nodes: Top-normal size pretracheal lymph node. Lungs/Pleura: No pneumothorax. Scattered atelectatic changes/scarring. 4 mm right apical nodule (series 4 image 24). Trachea and mainstem bronchi are patent. Upper Abdomen: Elevated right hemidiaphragm. No acute upper abdominal abnormality noted. Gallstones. Right renal cyst. Musculoskeletal: Slightly displaced fracture anterolateral aspect right fifth and sixth rib. There may be a subtle buckle fracture of the right seventh rib. Broad-based lipoma right lateral chest wall. Sclerosis at the base of the T1 spinous process and left T1 lamina. Sclerotic metastatic disease not excluded in the proper clinical setting. No other sclerotic foci noted. Mild degenerative changes lower cervical spine and thoracic spine with mild curvature thoracic spine without focal compression fracture. IMPRESSION: Slightly displaced fracture anterolateral aspect right fifth and sixth rib. There may be a subtle buckle fracture of the right seventh rib. No pneumothorax. Basilar atelectatic changes/scarring. Broad-based lipoma right lateral chest wall. Sclerosis at the base of the T1 spinous process and left T1 lamina. Sclerotic metastatic disease not excluded in the proper clinical setting. No other sclerotic foci noted. Mild degenerative changes lower cervical spine and thoracic spine with mild curvature thoracic spine without focal compression fracture. 4 mm right apical nodule (series 4 image 24). No follow-up needed if patient is low-risk. Non-contrast chest CT can be considered in 12 months if patient is high-risk. This  recommendation follows the consensus statement: Guidelines for Management of Incidental Pulmonary Nodules Detected on CT Images: From the Fleischner Society 2017; Radiology 2017; 284:228-243. Aortic Atherosclerosis (ICD10-I70.0). Ascending thoracic aorta measures up to 4.1 cm. Prominent coronary artery calcification. Electronically Signed   By: Genia Del M.D.   On: 07/04/2016 17:14   Ct Cervical Spine Wo Contrast  Result Date: 07/04/2016 CLINICAL DATA:  Fall, hit head. EXAM: CT HEAD WITHOUT CONTRAST CT CERVICAL SPINE WITHOUT CONTRAST TECHNIQUE: Multidetector CT imaging of the head and cervical spine was performed following the standard protocol without intravenous contrast. Multiplanar CT image reconstructions  of the cervical spine were also generated. COMPARISON:  03/07/2015 FINDINGS: CT HEAD FINDINGS Brain: There is atrophy and chronic small vessel disease changes. No acute intracranial abnormality. Specifically, no hemorrhage, hydrocephalus, mass lesion, acute infarction, or significant intracranial injury. Vascular: No hyperdense vessel or unexpected calcification. Skull: No acute calvarial abnormality. Sinuses/Orbits: Visualized paranasal sinuses and mastoids clear. Orbital soft tissues unremarkable. Other: None CT CERVICAL SPINE FINDINGS Alignment: Normal Skull base and vertebrae: No fracture Soft tissues and spinal canal: Prevertebral soft tissues are normal. No epidural or paraspinal hematoma. Disc levels: Diffuse degenerative disc and facet disease throughout the cervical spine. Upper chest: Negative Other: Carotid artery calcifications again noted, unchanged. IMPRESSION: No acute intracranial abnormality. Atrophy, chronic microvascular disease. No acute bony abnormality in the cervical spine. Degenerative disc and facet disease. Electronically Signed   By: Rolm Baptise M.D.   On: 07/04/2016 15:14    Lab Data:  CBC:  Recent Labs Lab 07/04/16 1538 07/04/16 1548 07/06/16 0517  WBC 14.3*  --   12.3*  NEUTROABS 11.2*  --   --   HGB 13.0 14.3 12.8*  HCT 39.3 42.0 39.9  MCV 88.5  --  89.1  PLT 284  --  364   Basic Metabolic Panel:  Recent Labs Lab 07/04/16 1538 07/04/16 1548 07/05/16 0705 07/06/16 0517  NA 127* 128* 132* 132*  K 4.7 4.7 4.2 4.4  CL 93* 96* 99* 101  CO2 25  --  27 25  GLUCOSE 120* 122* 88 125*  BUN 15 18 13 12   CREATININE 1.01 0.90 0.88 0.86  CALCIUM 9.2  --  8.8* 9.2   GFR: Estimated Creatinine Clearance: 79.5 mL/min (by C-G formula based on SCr of 0.86 mg/dL). Liver Function Tests: No results for input(s): AST, ALT, ALKPHOS, BILITOT, PROT, ALBUMIN in the last 168 hours. No results for input(s): LIPASE, AMYLASE in the last 168 hours. No results for input(s): AMMONIA in the last 168 hours. Coagulation Profile: No results for input(s): INR, PROTIME in the last 168 hours. Cardiac Enzymes: No results for input(s): CKTOTAL, CKMB, CKMBINDEX, TROPONINI in the last 168 hours. BNP (last 3 results) No results for input(s): PROBNP in the last 8760 hours. HbA1C:  Recent Labs  07/05/16 1517  HGBA1C 7.6*   CBG:  Recent Labs Lab 07/05/16 0732 07/05/16 1250 07/05/16 1751 07/05/16 2059 07/06/16 0735  GLUCAP 87 149* 130* 126* 124*   Lipid Profile: No results for input(s): CHOL, HDL, LDLCALC, TRIG, CHOLHDL, LDLDIRECT in the last 72 hours. Thyroid Function Tests: No results for input(s): TSH, T4TOTAL, FREET4, T3FREE, THYROIDAB in the last 72 hours. Anemia Panel: No results for input(s): VITAMINB12, FOLATE, FERRITIN, TIBC, IRON, RETICCTPCT in the last 72 hours. Urine analysis: No results found for: COLORURINE, APPEARANCEUR, LABSPEC, PHURINE, GLUCOSEU, HGBUR, BILIRUBINUR, KETONESUR, PROTEINUR, UROBILINOGEN, NITRITE, LEUKOCYTESUR   Ripudeep Rai M.D. [facility] 07/06/2016, 11:23 AM  Pager: [phone] Between 7am to 7pm - call Pager - [phone]  After 7pm go to [email or web address] - password [email or web address]  Call night coverage person covering after  7pm

## 2016-07-06 NOTE — Progress Notes (Signed)
Physical Therapy Treatment Patient Details Name: Ernest Clark. MRN: [id number] DOB: July 08, 1931 Today's Date: 07/06/2016    History of Present Illness Thurman Sarver. is a 83 y.o. male with a past medical history significant for CAD s/p PCI in '94. COPD, IDDM, HTN, and PVD who presents with fall and rib pain.    PT Comments    Upon arrival pt is transferring supine to sit at EOB with nursing to use urinal. Pt reports his R side is less painful today and agrees to mobilize. Pt requires VCs for safe use of RW during ambulation, as well as directional cues as pt is observed to be somewhat inattentive to surroundings. During ambulation, pt reported becoming SOB and SpO2 monitored (see vital info below). Pt able to navigate stairs today. Pt states he lives at home with his ex wife and that they have several aides that live with them- however, not sure how reliable this information is. Pt will benefit from continued acute therapy for strengthening and mobilization for safe d/c.   Vitals: SpO2 during ambulation: 88%; improved to 94% after instructing pt in deep breathing.    Follow Up Recommendations  Home health PT;Supervision/Assistance - 24 hour (pt reports he lives with ex wife and has aides 24hrs, unsure how reliable this is)     Equipment Recommendations  None recommended by PT    Recommendations for Other Services       Precautions / Restrictions Precautions Precautions: Fall Precaution Comments: very HOH    Mobility  Bed Mobility   Bed Mobility: Supine to Sit     Supine to sit: Min assist     General bed mobility comments: min HHA to rise from surface  Transfers Overall transfer level: Needs assistance   Transfers: Sit to/from Stand Sit to Stand: Min guard         General transfer comment: mod cues with pt ignoring safety for hand placement  Ambulation/Gait Ambulation/Gait assistance: Min guard Ambulation Distance (Feet): 300 Feet Assistive device:  Rolling walker (2 wheeled) Gait Pattern/deviations: Step-through pattern;Trunk flexed   Gait velocity interpretation: Below normal speed for age/gender General Gait Details: cues for direction and to step into RW   Stairs Stairs: Yes   Stair Management: Step to pattern;Forwards;One rail Right Number of Stairs: 4 General stair comments: pt ascended with rail right and HHA on left, descended sideways with both hands on right rail   Wheelchair Mobility    Modified Rankin (Stroke Patients Only)       Balance Overall balance assessment: History of Falls   Sitting balance-Leahy Scale: Fair       Standing balance-Leahy Scale: Fair                              Cognition Arousal/Alertness: Awake/alert Behavior During Therapy: WFL for tasks assessed/performed Overall Cognitive Status: No family/caregiver present to determine baseline cognitive functioning                                 General Comments: 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, delayed response to commands particularly for transfers      Exercises      General Comments        Pertinent Vitals/Pain Pain Score: 8  Pain Location: right chest Pain Descriptors / Indicators: Sore Pain Intervention(s): Limited activity within patient's tolerance;Repositioned;Monitored during session  Home Living                      Prior Function            PT Goals (current goals can now be found in the care plan section) Progress towards PT goals: Progressing toward goals    Frequency           PT Plan      Co-evaluation              AM-PAC PT "6 Clicks" Daily Activity  Outcome Measure  Difficulty turning over in bed (including adjusting bedclothes, sheets and blankets)?: A Lot Difficulty moving from lying on back to sitting on the side of the bed? : Total Difficulty sitting down on and standing up from a chair with arms (e.g., wheelchair, bedside commode,  etc,.)?: A Little Help needed moving to and from a bed to chair (including a wheelchair)?: A Little Help needed walking in hospital room?: A Little Help needed climbing 3-5 steps with a railing? : A Little 6 Click Score: 15    End of Session Equipment Utilized During Treatment: Gait belt Activity Tolerance: Patient tolerated treatment well Patient left: in chair;with call bell/phone within reach;with chair alarm set Nurse Communication: Mobility status       Time: 1107-1130 PT Time Calculation (min) (ACUTE ONLY): 23 min  Charges:  $Gait Training: 23-37 mins                    G CodesElberta Leatherwood, SPT Acute Re[facility] [place] 07/06/2016, 12:50 PM

## 2016-07-06 NOTE — Clinical Social Work Note (Signed)
CSW met with pt this AM to discuss consult for SNF placement. Pt refused SNF initially, but gave CSW permission to speak with son. CSW spoke with son-Monnie Santilli at [phone] who feels pt need STR at [facility].   CSW met with pt and son at bedside to address SNF. Pt now agreeable to SNF placement. CSW will fax [facility] and provide offers. Pt preference is [facility]. CSW discussed with pt and son that pt would need a 3 night, inpatient qualifying stay for Medicare to pay for STR. Son expressed understanding. CSW assessment to follow. CSW continuing to follow.   Oretha Ellis, [place], [place] Work [phone]

## 2016-07-07 ENCOUNTER — Inpatient Hospital Stay (HOSPITAL_COMMUNITY): Payer: Medicare Other

## 2016-07-07 LAB — BASIC METABOLIC PANEL
Anion gap: 5 (ref 5–15)
BUN: 11 mg/dL (ref 6–20)
CO2: 24 mmol/L (ref 22–32)
Calcium: 8.9 mg/dL (ref 8.9–10.3)
Chloride: 103 mmol/L (ref 101–111)
Creatinine, Ser: 0.85 mg/dL (ref 0.61–1.24)
GFR calc Af Amer: >60 mL/min (ref >60)
GFR calc non Af Amer: >60 mL/min (ref >60)
Glucose, Bld: 115 mg/dL — ABNORMAL HIGH (ref 65–99)
Potassium: 4.4 mmol/L (ref 3.5–5.1)
Sodium: 132 mmol/L — ABNORMAL LOW (ref 135–145)

## 2016-07-07 LAB — GLUCOSE, CAPILLARY
Glucose-Capillary: 125 mg/dL — ABNORMAL HIGH (ref 65–99)
Glucose-Capillary: 141 mg/dL — ABNORMAL HIGH (ref 65–99)
Glucose-Capillary: 126 mg/dL — ABNORMAL HIGH (ref 65–99)
Glucose-Capillary: 129 mg/dL — ABNORMAL HIGH (ref 65–99)

## 2016-07-07 LAB — BRAIN NATRIURETIC PEPTIDE: B Natriuretic Peptide: 30.8 pg/mL (ref 0.0–100.0)

## 2016-07-07 MED ORDER — ALBUTEROL SULFATE (2.5 MG/3ML) 0.083% IN NEBU
2.5000 mg | INHALATION_SOLUTION | Freq: Three times a day (TID) | RESPIRATORY_TRACT | Status: DC
  Administered 2016-07-07 – 2016-07-08 (×4): 2.5 mg via RESPIRATORY_TRACT
  Filled 2016-07-07 (×5): qty 3

## 2016-07-07 MED ORDER — POLYETHYLENE GLYCOL 3350 17 G PO PACK: 17.0000 g | PACK | Freq: Every day | ORAL | Status: DC | PRN

## 2016-07-07 MED ORDER — ALBUTEROL SULFATE (2.5 MG/3ML) 0.083% IN NEBU: 2.5000 mg | INHALATION_SOLUTION | Freq: Four times a day (QID) | RESPIRATORY_TRACT | Status: DC | PRN

## 2016-07-07 MED ORDER — SENNOSIDES-DOCUSATE SODIUM 8.6-50 MG PO TABS
1.0000 | ORAL_TABLET | Freq: Two times a day (BID) | ORAL | Status: DC
  Administered 2016-07-07 – 2016-07-08 (×3): 1 via ORAL
  Filled 2016-07-07 (×3): qty 1

## 2016-07-07 NOTE — Progress Notes (Addendum)
[facility]                                                                              Patient Demographics  Ernest Clark, is a 83 y.o. male, DOB - [birth year], XBM:[id number]  Admit date - 07/04/2016   Admitting Physician Ernest Dada, MD  Outpatient Primary MD for the patient is Little, Jeanella Craze, MD  Outpatient specialists:   LOS - 2  days   Medical records reviewed and are as summarized below:    Chief Complaint  Patient presents with  . Fall  . Rib Injury  . Shoulder Pain       Brief summary    83 y.o. male with a past medical history significant for CAD s/p PCI in '94. COPD, IDDM, HTN, and PVD  Presented with mechanical fall and rib pain. The patient reported that he was in his usual state of health until about a week ago when he started to feel weaker, 2 nights before admission he woke up to urinate, Ernest Clark on his bad and landed on his right side and hurt his right RIBS. Rib x-ray showed right 5-7 rib fractures. CT chest showed rib fractures, incidental focal sclerotic lesion in the thoracic spine, nonspecific and incidental lung nodule. Patient was admitted for multiple rib fractures and hyponatremia.   Assessment & Plan    Principal Problem:   Multiple rib fractures - From mechanical fall - Feeling somewhat short of breath today, obtain chest x-ray, BNP - Discontinued IV fluids - Continue pain control, incentive spirometry - Wheezing, placed on scheduled nebs Addendum:  CXR : no ac abn  BNP 30   Active Problems: Hyponatremia - Improving, was on Lasix which was started 2 months ago for leg swelling.  - Sodium 127 on admission, improving to 132 - IV fluids discontinued, may need to restart Lasix, if fluid overload    COPD (chronic obstructive pulmonary disease) (HCC) - Wheezing today, placed on scheduled nebs, continue dulera  CT chest findings, pulmonary nodule - Repeat CT chest in 12 months - Mildly elevated PSA, 10.45,  no prior PSA to compare with.  -Outpatient follow-up with urology recommended    Type 2 diabetes mellitus without complication, with long-term current use of insulin (HCC) - CBG is fairly controlled inpatient  - However hemoglobin A1c 7.6, continue Lantus 26 units, NovoLog sliding scale insulin  GERD - Stable, continue PPI  History of gout -Stable, continue allopurinol  Code Status: full  DVT Prophylaxis:  Lovenox Family Communication: Discussed in detail with the patient, all imaging results, lab results explained to the patient .   Disposition Plan: Skilled nursing facility once bed available, likely in a.m.  Time Spent in minutes  25 minutes  Procedures:    Consultants:     Antimicrobials:      Medications  Scheduled Meds: . albuterol  2.5 mg Nebulization TID  . allopurinol  100 mg Oral Daily  . aspirin EC  81 mg Oral Daily  . enoxaparin (LOVENOX) injection  40 mg Subcutaneous Q24H  . insulin aspart  0-15 Units Subcutaneous TID WC  . insulin aspart  0-5  Units Subcutaneous QHS  . insulin glargine  26 Units Subcutaneous q morning - 10a  . irbesartan  300 mg Oral Daily  . levothyroxine  25 mcg Oral QAC breakfast  . mometasone-formoterol  2 puff Inhalation BID  . pantoprazole  40 mg Oral Daily  . rosuvastatin  40 mg Oral Daily  . senna-docusate  1 tablet Oral BID   Continuous Infusions:  PRN Meds:.albuterol, guaiFENesin-dextromethorphan, HYDROcodone-acetaminophen, ondansetron **OR** ondansetron (ZOFRAN) IV, polyethylene glycol   Antibiotics   Anti-infectives    None        Subjective:   Ernest Clark was seen and examined today. Feeling somewhat short of breath, wheezing today. No fevers. Still having some right-sided soreness, overall improving. Patient denies dizziness, abdominal pain, N/V/D/C, new weakness, numbess, tingling.   Objective:   Vitals:   07/06/16 2137 07/07/16 0442 07/07/16 0614 07/07/16 0941  BP: (!) 155/81 (!) 158/72    Pulse:  93 92  98  Resp: 18 18  18   Temp: 98.7 F (37.1 C) 98.1 F (36.7 C)    TempSrc: Oral Oral    SpO2: 95% 95% 92% 90%  Weight:      Height:        Intake/Output Summary (Last 24 hours) at 07/07/16 1251 Last data filed at 07/07/16 0900  Gross per 24 hour  Intake           3247.5 ml  Output             1400 ml  Net           1847.5 ml     Wt Readings from Last 3 Encounters:  07/04/16 111.6 kg (246 lb)  04/07/15 105.6 kg (232 lb 12.8 oz)  03/06/15 108.6 kg (239 lb 8 oz)     Exam Physical Exam General: Alert and oriented x 3, NAD Eyes:  HEENT:  normocephalic atraumatic Cardiovascular: S1 S2 clear, RRR, trace edema b/l Respiratory: Bilateral scattered expiratory wheezing Gastrointestinal: Soft, nontender, nondistended, + bowel sounds Ext: no pedal edema bilaterally Neuro: no FND Musculoskeletal: No digital cyanosis, clubbing Skin: No rashes Psych: Normal affect and demeanor, alert and oriented x3       Data Reviewed:  I have personally reviewed following labs and imaging studies  Micro Results No results found for this or any previous visit (from the past 240 hour(s)).  Radiology Reports Dg Ribs Unilateral W/chest Right  Result Date: 07/04/2016 CLINICAL DATA:  Status post fall from bed twice this morning. Generalized right ribcage pain. EXAM: RIGHT RIBS AND CHEST - 3+ VIEW COMPARISON:  Chest x-ray of September 14, 2004 FINDINGS: The lungs are adequately inflated. There is no focal infiltrate. There is no pneumothorax nor definite pleural effusion. The heart and pulmonary vascularity are normal. There is calcification in the wall of the aortic arch. Right rib detail images reveal minimally displaced fractures of the lateral aspects of the right fifth, sixth, and seventh ribs. IMPRESSION: Fractures of the lateral aspects of the right fifth through seventh ribs. No pneumothorax nor significant pleural effusion. Mild chronic bronchitic changes. Thoracic aortic atherosclerosis.  Electronically Signed   By: David  Martinique M.D.   On: 07/04/2016 14:54   Ct Head Wo Contrast  Result Date: 07/04/2016 CLINICAL DATA:  Fall, hit head. EXAM: CT HEAD WITHOUT CONTRAST CT CERVICAL SPINE WITHOUT CONTRAST TECHNIQUE: Multidetector CT imaging of the head and cervical spine was performed following the standard protocol without intravenous contrast. Multiplanar CT image reconstructions of the cervical spine were also generated.  COMPARISON:  03/07/2015 FINDINGS: CT HEAD FINDINGS Brain: There is atrophy and chronic small vessel disease changes. No acute intracranial abnormality. Specifically, no hemorrhage, hydrocephalus, mass lesion, acute infarction, or significant intracranial injury. Vascular: No hyperdense vessel or unexpected calcification. Skull: No acute calvarial abnormality. Sinuses/Orbits: Visualized paranasal sinuses and mastoids clear. Orbital soft tissues unremarkable. Other: None CT CERVICAL SPINE FINDINGS Alignment: Normal Skull base and vertebrae: No fracture Soft tissues and spinal canal: Prevertebral soft tissues are normal. No epidural or paraspinal hematoma. Disc levels: Diffuse degenerative disc and facet disease throughout the cervical spine. Upper chest: Negative Other: Carotid artery calcifications again noted, unchanged. IMPRESSION: No acute intracranial abnormality. Atrophy, chronic microvascular disease. No acute bony abnormality in the cervical spine. Degenerative disc and facet disease. Electronically Signed   By: Rolm Baptise M.D.   On: 07/04/2016 15:14   Ct Chest W Contrast  Result Date: 07/04/2016 CLINICAL DATA:  83 year old hypertensive male post fall getting out of bed 2 days ago hitting right-side of chest. Shortness breath. Subsequent encounter. EXAM: CT CHEST WITH CONTRAST TECHNIQUE: Multidetector CT imaging of the chest was performed during intravenous contrast administration. CONTRAST:  59mL ISOVUE-300 IOPAMIDOL (ISOVUE-300) INJECTION 61% COMPARISON:  07/04/2016  chest x-ray. FINDINGS: Cardiovascular: No pulmonary embolus. No aortic dissection. Ascending thoracic aorta measures up to 4.1 cm. Heart size within normal limits. Prominent coronary artery calcification. Calcification aortic root. Mediastinum/Nodes: Top-normal size pretracheal lymph node. Lungs/Pleura: No pneumothorax. Scattered atelectatic changes/scarring. 4 mm right apical nodule (series 4 image 24). Trachea and mainstem bronchi are patent. Upper Abdomen: Elevated right hemidiaphragm. No acute upper abdominal abnormality noted. Gallstones. Right renal cyst. Musculoskeletal: Slightly displaced fracture anterolateral aspect right fifth and sixth rib. There may be a subtle buckle fracture of the right seventh rib. Broad-based lipoma right lateral chest wall. Sclerosis at the base of the T1 spinous process and left T1 lamina. Sclerotic metastatic disease not excluded in the proper clinical setting. No other sclerotic foci noted. Mild degenerative changes lower cervical spine and thoracic spine with mild curvature thoracic spine without focal compression fracture. IMPRESSION: Slightly displaced fracture anterolateral aspect right fifth and sixth rib. There may be a subtle buckle fracture of the right seventh rib. No pneumothorax. Basilar atelectatic changes/scarring. Broad-based lipoma right lateral chest wall. Sclerosis at the base of the T1 spinous process and left T1 lamina. Sclerotic metastatic disease not excluded in the proper clinical setting. No other sclerotic foci noted. Mild degenerative changes lower cervical spine and thoracic spine with mild curvature thoracic spine without focal compression fracture. 4 mm right apical nodule (series 4 image 24). No follow-up needed if patient is low-risk. Non-contrast chest CT can be considered in 12 months if patient is high-risk. This recommendation follows the consensus statement: Guidelines for Management of Incidental Pulmonary Nodules Detected on CT Images: From  the Fleischner Society 2017; Radiology 2017; 284:228-243. Aortic Atherosclerosis (ICD10-I70.0). Ascending thoracic aorta measures up to 4.1 cm. Prominent coronary artery calcification. Electronically Signed   By: Genia Del M.D.   On: 07/04/2016 17:14   Ct Cervical Spine Wo Contrast  Result Date: 07/04/2016 CLINICAL DATA:  Fall, hit head. EXAM: CT HEAD WITHOUT CONTRAST CT CERVICAL SPINE WITHOUT CONTRAST TECHNIQUE: Multidetector CT imaging of the head and cervical spine was performed following the standard protocol without intravenous contrast. Multiplanar CT image reconstructions of the cervical spine were also generated. COMPARISON:  03/07/2015 FINDINGS: CT HEAD FINDINGS Brain: There is atrophy and chronic small vessel disease changes. No acute intracranial abnormality. Specifically, no hemorrhage, hydrocephalus, mass  lesion, acute infarction, or significant intracranial injury. Vascular: No hyperdense vessel or unexpected calcification. Skull: No acute calvarial abnormality. Sinuses/Orbits: Visualized paranasal sinuses and mastoids clear. Orbital soft tissues unremarkable. Other: None CT CERVICAL SPINE FINDINGS Alignment: Normal Skull base and vertebrae: No fracture Soft tissues and spinal canal: Prevertebral soft tissues are normal. No epidural or paraspinal hematoma. Disc levels: Diffuse degenerative disc and facet disease throughout the cervical spine. Upper chest: Negative Other: Carotid artery calcifications again noted, unchanged. IMPRESSION: No acute intracranial abnormality. Atrophy, chronic microvascular disease. No acute bony abnormality in the cervical spine. Degenerative disc and facet disease. Electronically Signed   By: Rolm Baptise M.D.   On: 07/04/2016 15:14    Lab Data:  CBC:  Recent Labs Lab 07/04/16 1538 07/04/16 1548 07/06/16 0517  WBC 14.3*  --  12.3*  NEUTROABS 11.2*  --   --   HGB 13.0 14.3 12.8*  HCT 39.3 42.0 39.9  MCV 88.5  --  89.1  PLT 284  --  997   Basic  Metabolic Panel:  Recent Labs Lab 07/04/16 1538 07/04/16 1548 07/05/16 0705 07/06/16 0517 07/07/16 0527  NA 127* 128* 132* 132* 132*  K 4.7 4.7 4.2 4.4 4.4  CL 93* 96* 99* 101 103  CO2 25  --  27 25 24   GLUCOSE 120* 122* 88 125* 115*  BUN 15 18 13 12 11   CREATININE 1.01 0.90 0.88 0.86 0.85  CALCIUM 9.2  --  8.8* 9.2 8.9   GFR: Estimated Creatinine Clearance: 80.5 mL/min (by C-G formula based on SCr of 0.85 mg/dL). Liver Function Tests: No results for input(s): AST, ALT, ALKPHOS, BILITOT, PROT, ALBUMIN in the last 168 hours. No results for input(s): LIPASE, AMYLASE in the last 168 hours. No results for input(s): AMMONIA in the last 168 hours. Coagulation Profile: No results for input(s): INR, PROTIME in the last 168 hours. Cardiac Enzymes: No results for input(s): CKTOTAL, CKMB, CKMBINDEX, TROPONINI in the last 168 hours. BNP (last 3 results) No results for input(s): PROBNP in the last 8760 hours. HbA1C:  Recent Labs  07/05/16 1517  HGBA1C 7.6*   CBG:  Recent Labs Lab 07/06/16 1127 07/06/16 1605 07/06/16 2209 07/07/16 0751 07/07/16 1158  GLUCAP 136* 110* 141* 125* 141*   Lipid Profile: No results for input(s): CHOL, HDL, LDLCALC, TRIG, CHOLHDL, LDLDIRECT in the last 72 hours. Thyroid Function Tests: No results for input(s): TSH, T4TOTAL, FREET4, T3FREE, THYROIDAB in the last 72 hours. Anemia Panel: No results for input(s): VITAMINB12, FOLATE, FERRITIN, TIBC, IRON, RETICCTPCT in the last 72 hours. Urine analysis: No results found for: COLORURINE, APPEARANCEUR, LABSPEC, PHURINE, GLUCOSEU, HGBUR, BILIRUBINUR, KETONESUR, PROTEINUR, UROBILINOGEN, NITRITE, LEUKOCYTESUR   Aubry Tucholski M.D. [facility] 07/07/2016, 12:51 PM  Pager: [phone] Between 7am to 7pm - call Pager - [phone]  After 7pm go to [email or web address] - password [email or web address]  Call night coverage person covering after 7pm

## 2016-07-07 NOTE — NC FL2 (Signed)
[place] LEVEL OF CARE SCREENING TOOL     IDENTIFICATION  Patient Name: Ernest Clark. Birthdate: Nov 09, 1931 Sex: male Admission Date (Current Location): 07/04/2016  [place] and [place] Number:  Jeorge Reister [phone] Facility and Address:  [facility] [place], [place] [address], [place], [place] [address]      Provider Number: [id number]  Attending Physician Name and Address:  Mendel Corning, MD  Relative Name and Phone Number:       Current Level of Care: Hospital Recommended Level of Care: [place] Prior Approval Number:    Date Approved/Denied:   PASRR Number: [id number]  Discharge Plan: SNF    Current Diagnoses: Patient Active Problem List   Diagnosis Date Noted  . Hyponatremia 07/04/2016  . Type 2 diabetes mellitus without complication, with long-term current use of insulin ([place]) 07/04/2016  . Multiple rib fractures 07/04/2016  . Peripheral arterial disease ([place]) 09/25/2013  . Coronary artery disease due to lipid rich plaque 09/25/2013  . Essential hypertension 09/25/2013  . Hyperlipidemia 09/25/2013  . COPD (chronic obstructive pulmonary disease) ([place]) 12/09/2010    Orientation RESPIRATION BLADDER Height & Weight     Self, Time, Situation, Place  Normal Continent Weight: 246 lb (111.6 kg) Height:  5' 8.5" (174 cm)  BEHAVIORAL SYMPTOMS/MOOD NEUROLOGICAL BOWEL NUTRITION STATUS      Continent Diet (Heart healthy/carb modified; thin fluids)  AMBULATORY STATUS COMMUNICATION OF NEEDS Skin   Limited Assist Verbally Normal                       Personal Care Assistance Level of Assistance  Bathing, Feeding, Dressing Bathing Assistance: Limited assistance Feeding assistance: Independent Dressing Assistance: Limited assistance     Functional Limitations Info  Sight, Hearing, Speech Sight Info: Adequate Hearing Info: Impaired (Hard of hearing; hearing aid) Speech Info: Adequate     SPECIAL CARE FACTORS FREQUENCY  PT (By licensed PT), OT (By licensed OT)     PT Frequency: 5x OT Frequency: 5x            Contractures Contractures Info: Not present    Additional Factors Info  Code Status, Allergies, Insulin Sliding Scale Code Status Info: Full Allergies Info: Actos Pioglitazone Hydrochloride, Metformin And Related   Insulin Sliding Scale Info: See med list       Current Medications (07/07/2016):  This is the current hospital active medication list Current Facility-Administered Medications  Medication Dose Route Frequency Provider Last Rate Last Dose  . albuterol (PROVENTIL) (2.5 MG/3ML) 0.083% nebulizer solution 2.5 mg  2.5 mg Nebulization Q6H PRN Rai, Ripudeep K, MD      . albuterol (PROVENTIL) (2.5 MG/3ML) 0.083% nebulizer solution 2.5 mg  2.5 mg Nebulization TID Rai, Ripudeep K, MD   2.5 mg at 07/07/16 1441  . allopurinol (ZYLOPRIM) tablet 100 mg  100 mg Oral Daily Edwin Dada, MD   100 mg at 07/07/16 0944  . aspirin EC tablet 81 mg  81 mg Oral Daily Edwin Dada, MD   81 mg at 07/07/16 0944  . enoxaparin (LOVENOX) injection 40 mg  40 mg Subcutaneous Q24H Edwin Dada, MD   40 mg at 07/07/16 0945  . guaiFENesin-dextromethorphan (ROBITUSSIN DM) 100-10 MG/5ML syrup 5 mL  5 mL Oral Q4H PRN Rai, Ripudeep K, MD   5 mL at 07/07/16 1342  . HYDROcodone-acetaminophen (NORCO/VICODIN) 5-325 MG per tablet 1 tablet  1 tablet Oral Q4H PRN Danford, Suann Larry, MD  1 tablet at 07/06/16 1855  . insulin aspart (novoLOG) injection 0-15 Units  0-15 Units Subcutaneous TID WC Danford, Suann Larry, MD   2 Units at 07/07/16 1246  . insulin aspart (novoLOG) injection 0-5 Units  0-5 Units Subcutaneous QHS Danford, Christopher P, MD      . insulin glargine (LANTUS) injection 26 Units  26 Units Subcutaneous q morning - 10a Edwin Dada, MD   26 Units at 07/07/16 0944  . irbesartan (AVAPRO) tablet 300 mg  300 mg Oral Daily Edwin Dada, MD   300 mg at 07/07/16 0944  . levothyroxine (SYNTHROID, LEVOTHROID) tablet 25 mcg  25 mcg Oral QAC breakfast Rai, Ripudeep K, MD   25 mcg at 07/07/16 0811  . mometasone-formoterol (DULERA) 200-5 MCG/ACT inhaler 2 puff  2 puff Inhalation BID Edwin Dada, MD   2 puff at 07/07/16 0940  . ondansetron (ZOFRAN) tablet 4 mg  4 mg Oral Q6H PRN Danford, Suann Larry, MD       Or  . ondansetron (ZOFRAN) injection 4 mg  4 mg Intravenous Q6H PRN Danford, Suann Larry, MD      . pantoprazole (PROTONIX) EC tablet 40 mg  40 mg Oral Daily Danford, Suann Larry, MD   40 mg at 07/07/16 0944  . polyethylene glycol (MIRALAX / GLYCOLAX) packet 17 g  17 g Oral Daily PRN Rai, Ripudeep K, MD      . rosuvastatin (CRESTOR) tablet 40 mg  40 mg Oral Daily Danford, Suann Larry, MD   40 mg at 07/07/16 0944  . senna-docusate (Senokot-S) tablet 1 tablet  1 tablet Oral BID Rai, Ripudeep K, MD   1 tablet at 07/07/16 9604     Discharge Medications: Please see discharge summary for a list of discharge medications.  Relevant Imaging Results:  Relevant Lab Results:   Additional Information SSN: [id number]  Truitt Merle, LCSW

## 2016-07-07 NOTE — Progress Notes (Signed)
Occupational Therapy Treatment Patient Details Name: Ernest Clark. MRN: [id number] DOB: 1931/05/22 Today's Date: 07/07/2016    History of present illness Ernest Clark. is a 83 y.o. male with a past medical history significant for CAD s/p PCI in '94. COPD, IDDM, HTN, and PVD who presents with fall and rib pain.   OT comments  Pt making progress with goals. Pt will now d/c to a SNF for STR before returning home. OT will continue to follow acutely Vitals: SpO2 during ADL mobility: 89%; improved to 93% after cues for pursed lip breathing.    Follow Up Recommendations  Supervision/Assistance - 24 hour;SNF    Equipment Recommendations  None recommended by OT    Recommendations for Other Services      Precautions / Restrictions Precautions Precautions: Fall Precaution Comments: very HOH Restrictions Weight Bearing Restrictions: No Other Position/Activity Restrictions: limited UE ROM due to rib pain       Mobility Bed Mobility               General bed mobility comments: pt up in recliner upon OT arrival  Transfers Overall transfer level: Needs assistance Equipment used: Rolling walker (2 wheeled) Transfers: Sit to/from Stand Sit to Stand: Min guard         General transfer comment: mod verbal cues for correct hand placement, technique    Balance Overall balance assessment: History of Falls Sitting-balance support: Feet supported;No upper extremity supported Sitting balance-Leahy Scale: Fair     Standing balance support: No upper extremity supported;During functional activity Standing balance-Leahy Scale: Fair                             ADL either performed or assessed with clinical judgement   ADL       Grooming: Wash/dry face;Wash/dry hands;Min guard;Standing   Upper Body Bathing: Min guard;Sitting (simulated)   Lower Body Bathing: Moderate assistance;Sit to/from stand;Sitting/lateral leans;Minimal assistance (simulated)            Toilet Transfer: Ambulation;Comfort height toilet;RW;Min guard;Cueing for safety;Grab bars Toilet Transfer Details (indicate cue type and reason): cues to use grab bars, correct hand placement Toileting- Clothing Manipulation and Hygiene: Min guard;Sit to/from stand       Functional mobility during ADLs: Rolling walker;Min guard (cues to use grab bars, correct hand placement) General ADL Comments: educated pt on energy conservation techniques during ADL mobility, pursed lip breathing     Vision Patient Visual Report: No change from baseline            Cognition Arousal/Alertness: Awake/alert   Overall Cognitive Status: No family/caregiver present to determine baseline cognitive functioning                                                      General Comments  pt very pleasant and cooperative    Pertinent Vitals/ Pain       Pain Assessment: 0-10 Pain Score: 5  Pain Location: right chest Pain Descriptors / Indicators: Sore Pain Intervention(s): Monitored during session;Repositioned  Home Living                                          Prior  Functioning/Environment              Frequency  Min 2X/week        Progress Toward Goals  OT Goals(current goals can now be found in the care plan section)  Progress towards OT goals: Progressing toward goals     Plan Discharge plan needs to be updated                     AM-PAC PT "6 Clicks" Daily Activity     Outcome Measure   Help from another person eating meals?: None Help from another person taking care of personal grooming?: A Little Help from another person toileting, which includes using toliet, bedpan, or urinal?: A Little Help from another person bathing (including washing, rinsing, drying)?: A Lot Help from another person to put on and taking off regular upper body clothing?: A Little Help from another person to put on and taking off regular  lower body clothing?: A Lot 6 Click Score: 17    End of Session Equipment Utilized During Treatment: Rolling walker  OT Visit Diagnosis: Unsteadiness on feet (R26.81);Other abnormalities of gait and mobility (R26.89);History of falling (Z91.81);Pain Pain - Right/Left: Right   Activity Tolerance Patient tolerated treatment well   Patient Left in chair;with call bell/phone within reach;with chair alarm set   Nurse Communication      Functional Assessment Tool Used: AM-PAC 6 Clicks Daily Activity   Time: 9150-5697 OT Time Calculation (min): 28 min  Charges: OT G-codes **NOT FOR INPATIENT CLASS** Functional Assessment Tool Used: AM-PAC 6 Clicks Daily Activity OT General Charges $OT Visit: 1 Procedure OT Treatments $Self Care/Home Management : 8-22 mins $Therapeutic Activity: 8-22 mins    Ernest Clark 07/07/2016, 10:55 AM

## 2016-07-08 DIAGNOSIS — R2681 Unsteadiness on feet: Secondary | ICD-10-CM | POA: Diagnosis not present

## 2016-07-08 DIAGNOSIS — E162 Hypoglycemia, unspecified: Secondary | ICD-10-CM | POA: Diagnosis not present

## 2016-07-08 DIAGNOSIS — R5381 Other malaise: Secondary | ICD-10-CM | POA: Diagnosis not present

## 2016-07-08 DIAGNOSIS — S299XXA Unspecified injury of thorax, initial encounter: Secondary | ICD-10-CM | POA: Diagnosis not present

## 2016-07-08 DIAGNOSIS — S2241XS Multiple fractures of ribs, right side, sequela: Secondary | ICD-10-CM | POA: Diagnosis not present

## 2016-07-08 DIAGNOSIS — J449 Chronic obstructive pulmonary disease, unspecified: Secondary | ICD-10-CM | POA: Diagnosis not present

## 2016-07-08 DIAGNOSIS — I10 Essential (primary) hypertension: Secondary | ICD-10-CM | POA: Diagnosis not present

## 2016-07-08 DIAGNOSIS — E119 Type 2 diabetes mellitus without complications: Secondary | ICD-10-CM | POA: Diagnosis not present

## 2016-07-08 DIAGNOSIS — M6281 Muscle weakness (generalized): Secondary | ICD-10-CM | POA: Diagnosis not present

## 2016-07-08 DIAGNOSIS — S2239XA Fracture of one rib, unspecified side, initial encounter for closed fracture: Secondary | ICD-10-CM | POA: Diagnosis not present

## 2016-07-08 DIAGNOSIS — R41841 Cognitive communication deficit: Secondary | ICD-10-CM | POA: Diagnosis not present

## 2016-07-08 DIAGNOSIS — M62838 Other muscle spasm: Secondary | ICD-10-CM | POA: Diagnosis not present

## 2016-07-08 DIAGNOSIS — I509 Heart failure, unspecified: Secondary | ICD-10-CM | POA: Diagnosis not present

## 2016-07-08 DIAGNOSIS — R278 Other lack of coordination: Secondary | ICD-10-CM | POA: Diagnosis not present

## 2016-07-08 DIAGNOSIS — R0789 Other chest pain: Secondary | ICD-10-CM | POA: Diagnosis not present

## 2016-07-08 DIAGNOSIS — R05 Cough: Secondary | ICD-10-CM | POA: Diagnosis not present

## 2016-07-08 LAB — GLUCOSE, CAPILLARY
Glucose-Capillary: 115 mg/dL — ABNORMAL HIGH (ref 65–99)
Glucose-Capillary: 177 mg/dL — ABNORMAL HIGH (ref 65–99)

## 2016-07-08 LAB — BASIC METABOLIC PANEL
Anion gap: 7 (ref 5–15)
BUN: 10 mg/dL (ref 6–20)
CO2: 25 mmol/L (ref 22–32)
Calcium: 9.2 mg/dL (ref 8.9–10.3)
Chloride: 101 mmol/L (ref 101–111)
Creatinine, Ser: 0.88 mg/dL (ref 0.61–1.24)
GFR calc Af Amer: >60 mL/min (ref >60)
GFR calc non Af Amer: >60 mL/min (ref >60)
Glucose, Bld: 132 mg/dL — ABNORMAL HIGH (ref 65–99)
Potassium: 4.2 mmol/L (ref 3.5–5.1)
Sodium: 133 mmol/L — ABNORMAL LOW (ref 135–145)

## 2016-07-08 MED ORDER — POLYETHYLENE GLYCOL 3350 17 G PO PACK: 17.0000 g | PACK | Freq: Every day | ORAL | 0 refills | Status: AC | PRN

## 2016-07-08 MED ORDER — SENNOSIDES-DOCUSATE SODIUM 8.6-50 MG PO TABS: 1.0000 | ORAL_TABLET | Freq: Two times a day (BID) | ORAL | Status: DC

## 2016-07-08 MED ORDER — FUROSEMIDE 40 MG PO TABS
40.0000 mg | ORAL_TABLET | Freq: Every day | ORAL | Status: DC
  Administered 2016-07-08: 40 mg via ORAL
  Filled 2016-07-08: qty 1

## 2016-07-08 MED ORDER — GUAIFENESIN-DM 100-10 MG/5ML PO SYRP: 5.0000 mL | ORAL_SOLUTION | ORAL | 0 refills | Status: DC | PRN

## 2016-07-08 MED ORDER — HYDROCODONE-ACETAMINOPHEN 5-325 MG PO TABS: 1.0000 | ORAL_TABLET | ORAL | 0 refills | Status: DC | PRN

## 2016-07-08 NOTE — Progress Notes (Signed)
Attempt to call report to [facility] [phone]. Voice mail left with call back number. Bartholomew Crews, RN

## 2016-07-08 NOTE — Clinical Social Work Placement (Signed)
   CLINICAL SOCIAL WORK PLACEMENT  NOTE  Date:  07/08/2016  Patient Details  Name: Ernest Clark. MRN: [id number] Date of Birth: [birth year]  Clinical Social Work is seeking post-discharge placement for this patient at the [place] level of care (*CSW will initial, date and re-position this form in  chart as items are completed):  Yes   Patient/family provided with [place] Work Department's list of facilities offering this level of care within the geographic area requested by the patient (or if unable, by the patient's family).  Yes   Patient/family informed of their freedom to choose among providers that offer the needed level of care, that participate in Medicare, Medicaid or managed care program needed by the patient, have an available bed and are willing to accept the patient.  Yes   Patient/family informed of [place]'s ownership interest in [facility] and [facility], as well as of the fact that they are under no obligation to receive care at these facilities.  PASRR submitted to EDS on 07/07/16     PASRR number received on 07/07/16     Existing PASRR number confirmed on       FL2 transmitted to all facilities in geographic area requested by pt/family on 07/07/16     FL2 transmitted to all facilities within larger geographic area on       Patient informed that his/her managed care company has contracts with or will negotiate with certain facilities, including the following:        Yes   Patient/family informed of bed offers received.  Patient chooses bed at [facility]     Physician recommends and patient chooses bed at      Patient to be transferred to [facility] on 07/08/16.  Patient to be transferred to facility by [facility]     Patient family notified on 07/08/16 of transfer.  Name of family member notified:  Ernest Clark     PHYSICIAN       Additional Comment:     _______________________________________________ Truitt Merle, [place] 07/08/2016, 5:01 PM

## 2016-07-08 NOTE — Progress Notes (Signed)
Attempt to call report to [facility] at [phone]. No answer. Bartholomew Crews, RN

## 2016-07-08 NOTE — Discharge Summary (Signed)
Physician Discharge Summary   Patient ID: Ernest Clark. MRN: [id number] DOB/AGE: May 26, 1931 83 y.o.  Admit date: 07/04/2016 Discharge date: 07/08/2016  Primary Care Physician:  Ernest Reek, MD  Discharge Diagnoses:   [place] Kitchen Multiple rib fractures . Hyponatremia . COPD (chronic obstructive pulmonary disease) (HCC)With mild exacerbation . Coronary artery disease due to lipid rich plaque . Essential hypertension . Peripheral arterial disease ([place]) . Pulmonary nodule   Mildly elevated PSA   Consults: None  Recommendations for Outpatient Follow-up:  1. CT chest showed pulmonary nodule, repeat CT chest in 12 months 2. Please repeat CBC/BMET at next visit 3. Mildly elevated PSA 10.45, no prior PSA to compare with, outpatient follow-up with urology recommended   DIET: Carb modified diet    Allergies:   Allergies  Allergen Reactions  . Actos [Pioglitazone Hydrochloride] Other (See Comments)    RIGHT LEG SWELLING   . Metformin And Related Other (See Comments)     DISCHARGE MEDICATIONS: Current Discharge Medication List    START taking these medications   Details  guaiFENesin-dextromethorphan (ROBITUSSIN DM) 100-10 MG/5ML syrup Take 5 mLs by mouth every 4 (four) hours as needed for cough. Qty: 118 mL, Refills: 0    polyethylene glycol (MIRALAX / GLYCOLAX) packet Take 17 g by mouth daily as needed for moderate constipation. Qty: 14 each, Refills: 0    senna-docusate (SENOKOT-S) 8.6-50 MG tablet Take 1 tablet by mouth 2 (two) times daily.      CONTINUE these medications which have CHANGED   Details  HYDROcodone-acetaminophen (NORCO/VICODIN) 5-325 MG tablet Take 1 tablet by mouth every 4 (four) hours as needed for moderate pain or severe pain. Qty: 12 tablet, Refills: 0      CONTINUE these medications which have NOT CHANGED   Details  acetaminophen (TYLENOL) 500 MG tablet Take 1,000 mg by mouth every 6 (six) hours as needed.    albuterol (PROAIR HFA) 108 (90  BASE) MCG/ACT inhaler Inhale 2 puffs into the lungs every 6 (six) hours as needed for wheezing or shortness of breath.     allopurinol (ZYLOPRIM) 100 MG tablet TAKE 1 TABLET (100MG ) DAILY Refills: 12    aspirin 81 MG tablet Take 81 mg by mouth daily.     cetirizine (ZYRTEC) 10 MG tablet Take 10 mg by mouth daily as needed for allergies.    docusate sodium (STOOL SOFTENER) 100 MG capsule Take 100 mg by mouth 2 (two) times daily.    DULoxetine (CYMBALTA) 30 MG capsule Take 30 mg by mouth daily.    Ferrous Sulfate (IRON) 325 (65 Fe) MG TABS Take 325 mg by mouth daily.    furosemide (LASIX) 40 MG tablet TAKE 1 TABLET (40MG ) BY MOUTH DAILY Refills: 0    Insulin Degludec (TRESIBA FLEXTOUCH [place]) Inject 26 Units into the skin every morning.    insulin lispro (HUMALOG) 100 UNIT/ML injection Inject 8 Units into the skin daily.    levothyroxine (SYNTHROID, LEVOTHROID) 25 MCG tablet Take 25 mcg by mouth daily before breakfast.    Melatonin 10 MG TABS Take 10 mg by mouth at bedtime.    Mometasone Furo-Formoterol Fum (DULERA) 200-5 MCG/ACT AERO Inhale 2 puffs into the lungs 2 (two) times daily. Qty: 1 Inhaler, Refills: 11    olmesartan (BENICAR) 40 MG tablet One half daily Qty: 15 tablet, Refills: 5    omeprazole (PRILOSEC) 20 MG capsule Take 20 mg by mouth 2 (two) times daily.    rosuvastatin (CRESTOR) 40 MG tablet Take 40 mg  by mouth daily.      sitaGLIPtin (JANUVIA) 100 MG tablet Take 100 mg by mouth daily.         Brief H and P: For complete details please refer to admission H and P, but in brief 83 y.o.malewith a past medical history significant for CAD s/p PCI in '94. COPD, IDDM, HTN, and PVD  Presented with mechanical fall and rib pain. The patient reported that he was in his usual state of health until about a week ago when he started to feel weaker, 2 nights before admission he woke up to urinate, [name] on his bad and landed on his right side and hurt his right RIBS. Rib  x-ray showed right 5-7 rib fractures. CT chest showed rib fractures, incidental focal sclerotic lesion in the thoracic spine, nonspecific and incidental lung nodule. Patient was admitted for multiple rib fractures and hyponatremia.   Hospital Course:   Multiple rib fractures - Due to mechanical fall - CT chest showed slightly displaced fracture anterolateral aspect right fifth and sixth rib, subtle buckle fracture of the right seventh rib, no pneumothorax - Patient was placed on pain control, incentive spirometry - PT recommended skilled nursing facility for rehabilitation  Hyponatremia - Improving, was on Lasix which was started 2 months ago for leg swelling.  -Sodium 127 on admission, improving to 133 at the time of discharge.  - Patient received gentle hydration, IV fluids have been discontinued    COPD (chronic obstructive pulmonary disease) (HCC) with mild exacerbation -Continue albuterol nebs, continue dulera - Patient was recommended to continue incentive spirometry  - Chest x-ray showed no acute findings, no pneumonia, BNP 30  CT chest findings, pulmonary nodule - CT chest showed a 4 mm right apical nodule no follow-up needed if patient is low risk, noncontrast chest CT can be considered in 12 months if patient is high risk. Sclerosis at the base of T1 spinous process and left T1 lamina, sclerotic metastatic disease not excluded in the proper clinical setting, no other sclerotic foci noted. - Mildly elevated PSA, 10.45, no prior PSA to compare with.  -Outpatient follow-up with urology recommended    Type 2 diabetes mellitus without complication, with long-term current use of insulin (HCC) - CBG is fairly controlled inpatient  - hemoglobin A1c 7.6, continue outpatient insulin regimen  GERD - Stable, continue PPI  History of gout -Stable, continue allopurinol  Peripheral edema - Patient is on Lasix outpatient for lower extremity with edema, restarted again, follow  BMET closely, weekly, to avoid hyponatremia. May decrease it to 20 mg daily if patient is getting hyponatremic or has poor oral intake.    Day of Discharge BP (!) 144/69 (BP Location: Left Arm)   Pulse 84   Temp 97.7 F (36.5 C) (Oral)   Resp 18   Ht 5' 8.5" (1.74 m)   Wt 111.6 kg (246 lb)   SpO2 96%   BMI 36.86 kg/m   Physical Exam: General: Alert and awake oriented x3 not in any acute distress. HEENT: anicteric sclera, pupils reactive to light and accommodation CVS: S1-S2 clear no murmur rubs or gallops Chest: Decreased breath sound at the bases with mild scattered wheezing Abdomen : soft nontender, nondistended, normal bowel sounds Extremities: no cyanosis, clubbing or edema noted bilaterally Neuro: Cranial nerves II-XII intact, no focal neurological deficits   The results of significant diagnostics from this hospitalization (including imaging, microbiology, ancillary and laboratory) are listed below for reference.    LAB RESULTS: Basic Metabolic  Panel:  Recent Labs Lab 07/07/16 0527 07/08/16 0443  NA 132* 133*  K 4.4 4.2  CL 103 101  CO2 24 25  GLUCOSE 115* 132*  BUN 11 10  CREATININE 0.85 0.88  CALCIUM 8.9 9.2   Liver Function Tests: No results for input(s): AST, ALT, ALKPHOS, BILITOT, PROT, ALBUMIN in the last 168 hours. No results for input(s): LIPASE, AMYLASE in the last 168 hours. No results for input(s): AMMONIA in the last 168 hours. CBC:  Recent Labs Lab 07/04/16 1538 07/04/16 1548 07/06/16 0517  WBC 14.3*  --  12.3*  NEUTROABS 11.2*  --   --   HGB 13.0 14.3 12.8*  HCT 39.3 42.0 39.9  MCV 88.5  --  89.1  PLT 284  --  301   Cardiac Enzymes: No results for input(s): CKTOTAL, CKMB, CKMBINDEX, TROPONINI in the last 168 hours. BNP: Invalid input(s): POCBNP CBG:  Recent Labs Lab 07/07/16 2041 07/08/16 0747  GLUCAP 129* 115*    Significant Diagnostic Studies:  Dg Ribs Unilateral W/chest Right  Result Date: 07/04/2016 CLINICAL DATA:   Status post fall from bed twice this morning. Generalized right ribcage pain. EXAM: RIGHT RIBS AND CHEST - 3+ VIEW COMPARISON:  Chest x-ray of September 14, 2004 FINDINGS: The lungs are adequately inflated. There is no focal infiltrate. There is no pneumothorax nor definite pleural effusion. The heart and pulmonary vascularity are normal. There is calcification in the wall of the aortic arch. Right rib detail images reveal minimally displaced fractures of the lateral aspects of the right fifth, sixth, and seventh ribs. IMPRESSION: Fractures of the lateral aspects of the right fifth through seventh ribs. No pneumothorax nor significant pleural effusion. Mild chronic bronchitic changes. Thoracic aortic atherosclerosis. Electronically Signed   By: David  Martinique M.D.   On: 07/04/2016 14:54   Ct Head Wo Contrast  Result Date: 07/04/2016 CLINICAL DATA:  Fall, hit head. EXAM: CT HEAD WITHOUT CONTRAST CT CERVICAL SPINE WITHOUT CONTRAST TECHNIQUE: Multidetector CT imaging of the head and cervical spine was performed following the standard protocol without intravenous contrast. Multiplanar CT image reconstructions of the cervical spine were also generated. COMPARISON:  03/07/2015 FINDINGS: CT HEAD FINDINGS Brain: There is atrophy and chronic small vessel disease changes. No acute intracranial abnormality. Specifically, no hemorrhage, hydrocephalus, mass lesion, acute infarction, or significant intracranial injury. Vascular: No hyperdense vessel or unexpected calcification. Skull: No acute calvarial abnormality. Sinuses/Orbits: Visualized paranasal sinuses and mastoids clear. Orbital soft tissues unremarkable. Other: None CT CERVICAL SPINE FINDINGS Alignment: Normal Skull base and vertebrae: No fracture Soft tissues and spinal canal: Prevertebral soft tissues are normal. No epidural or paraspinal hematoma. Disc levels: Diffuse degenerative disc and facet disease throughout the cervical spine. Upper chest: Negative Other:  Carotid artery calcifications again noted, unchanged. IMPRESSION: No acute intracranial abnormality. Atrophy, chronic microvascular disease. No acute bony abnormality in the cervical spine. Degenerative disc and facet disease. Electronically Signed   By: Rolm Baptise M.D.   On: 07/04/2016 15:14   Ct Chest W Contrast  Result Date: 07/04/2016 CLINICAL DATA:  83 year old hypertensive male post fall getting out of bed 2 days ago hitting right-side of chest. Shortness breath. Subsequent encounter. EXAM: CT CHEST WITH CONTRAST TECHNIQUE: Multidetector CT imaging of the chest was performed during intravenous contrast administration. CONTRAST:  78mL ISOVUE-300 IOPAMIDOL (ISOVUE-300) INJECTION 61% COMPARISON:  07/04/2016 chest x-ray. FINDINGS: Cardiovascular: No pulmonary embolus. No aortic dissection. Ascending thoracic aorta measures up to 4.1 cm. Heart size within normal limits. Prominent coronary artery calcification.  Calcification aortic root. Mediastinum/Nodes: Top-normal size pretracheal lymph node. Lungs/Pleura: No pneumothorax. Scattered atelectatic changes/scarring. 4 mm right apical nodule (series 4 image 24). Trachea and mainstem bronchi are patent. Upper Abdomen: Elevated right hemidiaphragm. No acute upper abdominal abnormality noted. Gallstones. Right renal cyst. Musculoskeletal: Slightly displaced fracture anterolateral aspect right fifth and sixth rib. There may be a subtle buckle fracture of the right seventh rib. Broad-based lipoma right lateral chest wall. Sclerosis at the base of the T1 spinous process and left T1 lamina. Sclerotic metastatic disease not excluded in the proper clinical setting. No other sclerotic foci noted. Mild degenerative changes lower cervical spine and thoracic spine with mild curvature thoracic spine without focal compression fracture. IMPRESSION: Slightly displaced fracture anterolateral aspect right fifth and sixth rib. There may be a subtle buckle fracture of the right  seventh rib. No pneumothorax. Basilar atelectatic changes/scarring. Broad-based lipoma right lateral chest wall. Sclerosis at the base of the T1 spinous process and left T1 lamina. Sclerotic metastatic disease not excluded in the proper clinical setting. No other sclerotic foci noted. Mild degenerative changes lower cervical spine and thoracic spine with mild curvature thoracic spine without focal compression fracture. 4 mm right apical nodule (series 4 image 24). No follow-up needed if patient is low-risk. Non-contrast chest CT can be considered in 12 months if patient is high-risk. This recommendation follows the consensus statement: Guidelines for Management of Incidental Pulmonary Nodules Detected on CT Images: From the Fleischner Society 2017; Radiology 2017; 284:228-243. Aortic Atherosclerosis (ICD10-I70.0). Ascending thoracic aorta measures up to 4.1 cm. Prominent coronary artery calcification. Electronically Signed   By: Genia Del M.D.   On: 07/04/2016 17:14   Ct Cervical Spine Wo Contrast  Result Date: 07/04/2016 CLINICAL DATA:  Fall, hit head. EXAM: CT HEAD WITHOUT CONTRAST CT CERVICAL SPINE WITHOUT CONTRAST TECHNIQUE: Multidetector CT imaging of the head and cervical spine was performed following the standard protocol without intravenous contrast. Multiplanar CT image reconstructions of the cervical spine were also generated. COMPARISON:  03/07/2015 FINDINGS: CT HEAD FINDINGS Brain: There is atrophy and chronic small vessel disease changes. No acute intracranial abnormality. Specifically, no hemorrhage, hydrocephalus, mass lesion, acute infarction, or significant intracranial injury. Vascular: No hyperdense vessel or unexpected calcification. Skull: No acute calvarial abnormality. Sinuses/Orbits: Visualized paranasal sinuses and mastoids clear. Orbital soft tissues unremarkable. Other: None CT CERVICAL SPINE FINDINGS Alignment: Normal Skull base and vertebrae: No fracture Soft tissues and spinal  canal: Prevertebral soft tissues are normal. No epidural or paraspinal hematoma. Disc levels: Diffuse degenerative disc and facet disease throughout the cervical spine. Upper chest: Negative Other: Carotid artery calcifications again noted, unchanged. IMPRESSION: No acute intracranial abnormality. Atrophy, chronic microvascular disease. No acute bony abnormality in the cervical spine. Degenerative disc and facet disease. Electronically Signed   By: Rolm Baptise M.D.   On: 07/04/2016 15:14    2D ECHO:   Disposition and Follow-up: Discharge Instructions    Diet Carb Modified    Complete by:  As directed    Increase activity slowly    Complete by:  As directed        DISPOSITION: [place] information for follow-up providers    Little, Jeanella Craze, MD. Schedule an appointment as soon as possible for a visit in 2 week(s).   Specialty:  Cardiology Contact information: [address] [place] [address] [phone]            Contact information for after-discharge care  Destination    [facility] Follow up.   Specialty:  Skilled Nursing Facility Contact information: [place] [phone]                   Time spent on Discharge: 35 mins  Signed:   Estill Cotta M.D. [facility] 07/08/2016, 11:46 AM Pager: [phone]

## 2016-07-08 NOTE — Clinical Social Work Note (Signed)
Clinical Social Work Assessment  Patient Details  Name: Ernest Clark. 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  Yes Need for family participation in patient care:  No (Coment)  Care giving concerns:  No care giving concerns identified.    Social Worker assessment / plan:  CSW met with pt on 07/06/16 to address consult for new SNF. CSW introduced self and explained role of social work. Pt from home and was living with ex-wife as a caretaker, but can no longer provide care for her. Pt refusing SNF initially but fter speaking with son pt is agreeable. CSW provided SNF listing for review. Pt prefers [facility], but agreed for CSW to fax out to [facility].   Pt is ready for discharge today and will go to [place]. Pt and son are aware and agreeable to discharge plan. CSW sent clinicals to [facility] and communicated with Janett Billow (admissions) to confirm bed offer. Room and report provided to RN and put in treatment team sticky note. Transportation arranged with [facility]. CSW is signing off as no further needs identified.  Employment status:  Retired Health visitor PT Recommendations:  Home with [employer] (Lives  alone) Information / Referral to community resources:  [place]  Patient/Family's Response to care:  Pt and son pleased with CSW support and care from hospital team.   Patient/Family's Understanding of and Emotional Response to Diagnosis, Current Treatment, and Prognosis:  Pt understands he will benefit from STR prior to returning home and is accepting of his diagnosis, current treatment plan, and prognosis.    Emotional Assessment Appearance:  Appears stated age Attitude/Demeanor/Rapport:  Other (Appropriate) Affect (typically observed):  Accepting, Adaptable, Pleasant Orientation:  Oriented to Self, Oriented to Place, Oriented to  Time, Oriented to Situation Alcohol / Substance use:  Other Psych involvement (Current and /or in the community):  No (Comment)  Discharge Needs  Concerns to be addressed:  Discharge Planning Concerns, Care Coordination Readmission within the last 30 days:  No Current discharge risk:  Dependent with Mobility, Lives alone Barriers to Discharge:  Continued Medical Work up   [employer], LCSW 07/08/2016, 5:03 PM

## 2016-07-08 NOTE — Plan of Care (Signed)
Problem: Activity: Goal: Risk for activity intolerance will decrease Outcome: Progressing Up with walker with assist- little SOB when up.

## 2016-07-10 DIAGNOSIS — E119 Type 2 diabetes mellitus without complications: Secondary | ICD-10-CM | POA: Diagnosis not present

## 2016-07-10 DIAGNOSIS — S2239XA Fracture of one rib, unspecified side, initial encounter for closed fracture: Secondary | ICD-10-CM | POA: Diagnosis not present

## 2016-07-10 DIAGNOSIS — I509 Heart failure, unspecified: Secondary | ICD-10-CM | POA: Diagnosis not present

## 2016-07-10 NOTE — Consult Note (Signed)
           [facility] CM Primary Care Navigator  07/10/2016  Rolfe Hartsell. 1931/08/05 [id number]   Went to see patient at the bedside to identify possible discharge needs but he was already discharged. Patient was discharged to skilled nursing facility([facility]) over the weekend prior to returning back home.  Primary care provider's office called [facility]) and notified of patient's discharge, need for post SNF (skilled nursing facility) discharge follow-up and transition of care.  For questions, please contact:  Dannielle Huh, BSN, RN- [facility] Primary Care Navigator  Telephone: [phone] [place]

## 2016-07-12 DIAGNOSIS — M62838 Other muscle spasm: Secondary | ICD-10-CM | POA: Diagnosis not present

## 2016-07-13 DIAGNOSIS — R5381 Other malaise: Secondary | ICD-10-CM | POA: Diagnosis not present

## 2016-07-13 DIAGNOSIS — J449 Chronic obstructive pulmonary disease, unspecified: Secondary | ICD-10-CM | POA: Diagnosis not present

## 2016-07-13 DIAGNOSIS — R0789 Other chest pain: Secondary | ICD-10-CM | POA: Diagnosis not present

## 2016-07-13 DIAGNOSIS — S2239XA Fracture of one rib, unspecified side, initial encounter for closed fracture: Secondary | ICD-10-CM | POA: Diagnosis not present

## 2016-07-14 DIAGNOSIS — R05 Cough: Secondary | ICD-10-CM | POA: Diagnosis not present

## 2016-07-14 DIAGNOSIS — S2239XA Fracture of one rib, unspecified side, initial encounter for closed fracture: Secondary | ICD-10-CM | POA: Diagnosis not present

## 2016-07-26 ENCOUNTER — Ambulatory Visit: Payer: Medicare Other | Admitting: Cardiovascular Disease

## 2016-08-08 ENCOUNTER — Ambulatory Visit: Payer: Medicare Other | Admitting: Cardiovascular Disease

## 2016-08-10 DIAGNOSIS — I10 Essential (primary) hypertension: Secondary | ICD-10-CM | POA: Diagnosis not present

## 2016-08-10 DIAGNOSIS — S2239XA Fracture of one rib, unspecified side, initial encounter for closed fracture: Secondary | ICD-10-CM | POA: Diagnosis not present

## 2016-08-11 DIAGNOSIS — E162 Hypoglycemia, unspecified: Secondary | ICD-10-CM | POA: Diagnosis not present

## 2016-08-11 DIAGNOSIS — E119 Type 2 diabetes mellitus without complications: Secondary | ICD-10-CM | POA: Diagnosis not present

## 2016-08-24 DIAGNOSIS — R5381 Other malaise: Secondary | ICD-10-CM | POA: Diagnosis not present

## 2016-08-24 DIAGNOSIS — J449 Chronic obstructive pulmonary disease, unspecified: Secondary | ICD-10-CM | POA: Diagnosis not present

## 2016-08-24 DIAGNOSIS — I509 Heart failure, unspecified: Secondary | ICD-10-CM | POA: Diagnosis not present

## 2016-08-24 DIAGNOSIS — S2239XA Fracture of one rib, unspecified side, initial encounter for closed fracture: Secondary | ICD-10-CM | POA: Diagnosis not present

## 2016-08-27 DIAGNOSIS — Z7982 Long term (current) use of aspirin: Secondary | ICD-10-CM | POA: Diagnosis not present

## 2016-08-27 DIAGNOSIS — S0990XD Unspecified injury of head, subsequent encounter: Secondary | ICD-10-CM | POA: Diagnosis not present

## 2016-08-27 DIAGNOSIS — E1151 Type 2 diabetes mellitus with diabetic peripheral angiopathy without gangrene: Secondary | ICD-10-CM | POA: Diagnosis not present

## 2016-08-27 DIAGNOSIS — I2510 Atherosclerotic heart disease of native coronary artery without angina pectoris: Secondary | ICD-10-CM | POA: Diagnosis not present

## 2016-08-27 DIAGNOSIS — M109 Gout, unspecified: Secondary | ICD-10-CM | POA: Diagnosis not present

## 2016-08-27 DIAGNOSIS — S2241XD Multiple fractures of ribs, right side, subsequent encounter for fracture with routine healing: Secondary | ICD-10-CM | POA: Diagnosis not present

## 2016-08-27 DIAGNOSIS — I2583 Coronary atherosclerosis due to lipid rich plaque: Secondary | ICD-10-CM | POA: Diagnosis not present

## 2016-08-27 DIAGNOSIS — I10 Essential (primary) hypertension: Secondary | ICD-10-CM | POA: Diagnosis not present

## 2016-08-27 DIAGNOSIS — Z7984 Long term (current) use of oral hypoglycemic drugs: Secondary | ICD-10-CM | POA: Diagnosis not present

## 2016-08-27 DIAGNOSIS — K219 Gastro-esophageal reflux disease without esophagitis: Secondary | ICD-10-CM | POA: Diagnosis not present

## 2016-08-27 DIAGNOSIS — E785 Hyperlipidemia, unspecified: Secondary | ICD-10-CM | POA: Diagnosis not present

## 2016-08-27 DIAGNOSIS — J449 Chronic obstructive pulmonary disease, unspecified: Secondary | ICD-10-CM | POA: Diagnosis not present

## 2016-08-28 ENCOUNTER — Ambulatory Visit (INDEPENDENT_AMBULATORY_CARE_PROVIDER_SITE_OTHER): Payer: Medicare Other | Admitting: Podiatry

## 2016-08-28 ENCOUNTER — Encounter: Payer: Self-pay | Admitting: Podiatry

## 2016-08-28 DIAGNOSIS — M79674 Pain in right toe(s): Secondary | ICD-10-CM

## 2016-08-28 DIAGNOSIS — E1142 Type 2 diabetes mellitus with diabetic polyneuropathy: Secondary | ICD-10-CM | POA: Diagnosis not present

## 2016-08-28 DIAGNOSIS — B351 Tinea unguium: Secondary | ICD-10-CM

## 2016-08-28 DIAGNOSIS — I739 Peripheral vascular disease, unspecified: Secondary | ICD-10-CM | POA: Diagnosis not present

## 2016-08-28 DIAGNOSIS — M79675 Pain in left toe(s): Secondary | ICD-10-CM | POA: Diagnosis not present

## 2016-08-28 NOTE — Progress Notes (Signed)
Patient ID: Ernest Clark., male   DOB: [birth year], 83 y.o.   MRN: [id number]      Subjective: This patient presents today complaining of elongated and thickened toenails and requests toenail debridement. He states that the nails are cough and walking wearing shoes Patient is a diabetic with a known history of peripheral arterial disease that has been evaluated by Dr. Gwenlyn Found The patient's son is present in the treatment room today  Objective Vascular: Bilateral lower extremity pitting edema Tenderness palpation bilaterally DP and PT pulses 0/4 bilaterally Capillary reflex leg bilaterally  Neurological: Sensation to 10 g monofilament wire intact 4/5 right 1/5 left Vibratory sensation reactive right nonreactive left Ankle reflexes equal reactive bilaterally   Dermatological: Atrophic skin with absent hair growth bilaterally No open skin lesions bilaterally Corn distal third right toe Plantar callus fifth MPJ right Right ankle appears more edematous than the left The toenails are brittle hypertrophic, deformed and tender to direct palpation 6-10 The fifth left toenailis partially detached from the nailbed distally, There is no surrounding erythema, edema, warmth, drainage from the fifth left toenail. The entire fifth nail plates left plates left after debridement  Musculoskeletal: HAV right Hammertoe second right  Assessment: Diabetic Peripheral neuropathy Peripheral vascular disease Symptomatic onychomycoses 6-10   Plan: Debridement of toenails 6-10 mechanically and electrically without anybleeding Debride corn third right toe without any bleeding  Reappoint 3 months

## 2016-08-28 NOTE — Patient Instructions (Signed)

## 2016-08-29 DIAGNOSIS — J449 Chronic obstructive pulmonary disease, unspecified: Secondary | ICD-10-CM | POA: Diagnosis not present

## 2016-08-29 DIAGNOSIS — I10 Essential (primary) hypertension: Secondary | ICD-10-CM | POA: Diagnosis not present

## 2016-08-29 DIAGNOSIS — E1151 Type 2 diabetes mellitus with diabetic peripheral angiopathy without gangrene: Secondary | ICD-10-CM | POA: Diagnosis not present

## 2016-08-29 DIAGNOSIS — S0990XD Unspecified injury of head, subsequent encounter: Secondary | ICD-10-CM | POA: Diagnosis not present

## 2016-08-29 DIAGNOSIS — S2241XD Multiple fractures of ribs, right side, subsequent encounter for fracture with routine healing: Secondary | ICD-10-CM | POA: Diagnosis not present

## 2016-08-29 DIAGNOSIS — I2510 Atherosclerotic heart disease of native coronary artery without angina pectoris: Secondary | ICD-10-CM | POA: Diagnosis not present

## 2016-08-30 ENCOUNTER — Ambulatory Visit: Payer: Medicare Other | Admitting: Podiatry

## 2016-08-31 DIAGNOSIS — R911 Solitary pulmonary nodule: Secondary | ICD-10-CM | POA: Diagnosis not present

## 2016-08-31 DIAGNOSIS — Z23 Encounter for immunization: Secondary | ICD-10-CM | POA: Diagnosis not present

## 2016-08-31 DIAGNOSIS — E669 Obesity, unspecified: Secondary | ICD-10-CM | POA: Diagnosis not present

## 2016-08-31 DIAGNOSIS — R0781 Pleurodynia: Secondary | ICD-10-CM | POA: Diagnosis not present

## 2016-08-31 DIAGNOSIS — E871 Hypo-osmolality and hyponatremia: Secondary | ICD-10-CM | POA: Diagnosis not present

## 2016-08-31 DIAGNOSIS — Z6834 Body mass index (BMI) 34.0-34.9, adult: Secondary | ICD-10-CM | POA: Diagnosis not present

## 2016-08-31 DIAGNOSIS — R972 Elevated prostate specific antigen [PSA]: Secondary | ICD-10-CM | POA: Diagnosis not present

## 2016-09-05 DIAGNOSIS — S2241XD Multiple fractures of ribs, right side, subsequent encounter for fracture with routine healing: Secondary | ICD-10-CM | POA: Diagnosis not present

## 2016-09-05 DIAGNOSIS — J449 Chronic obstructive pulmonary disease, unspecified: Secondary | ICD-10-CM | POA: Diagnosis not present

## 2016-09-05 DIAGNOSIS — I2510 Atherosclerotic heart disease of native coronary artery without angina pectoris: Secondary | ICD-10-CM | POA: Diagnosis not present

## 2016-09-05 DIAGNOSIS — I10 Essential (primary) hypertension: Secondary | ICD-10-CM | POA: Diagnosis not present

## 2016-09-05 DIAGNOSIS — E1151 Type 2 diabetes mellitus with diabetic peripheral angiopathy without gangrene: Secondary | ICD-10-CM | POA: Diagnosis not present

## 2016-09-05 DIAGNOSIS — S0990XD Unspecified injury of head, subsequent encounter: Secondary | ICD-10-CM | POA: Diagnosis not present

## 2016-09-08 DIAGNOSIS — I2510 Atherosclerotic heart disease of native coronary artery without angina pectoris: Secondary | ICD-10-CM | POA: Diagnosis not present

## 2016-09-08 DIAGNOSIS — S0990XD Unspecified injury of head, subsequent encounter: Secondary | ICD-10-CM | POA: Diagnosis not present

## 2016-09-08 DIAGNOSIS — J449 Chronic obstructive pulmonary disease, unspecified: Secondary | ICD-10-CM | POA: Diagnosis not present

## 2016-09-08 DIAGNOSIS — I10 Essential (primary) hypertension: Secondary | ICD-10-CM | POA: Diagnosis not present

## 2016-09-08 DIAGNOSIS — S2241XD Multiple fractures of ribs, right side, subsequent encounter for fracture with routine healing: Secondary | ICD-10-CM | POA: Diagnosis not present

## 2016-09-08 DIAGNOSIS — E1151 Type 2 diabetes mellitus with diabetic peripheral angiopathy without gangrene: Secondary | ICD-10-CM | POA: Diagnosis not present

## 2016-09-12 DIAGNOSIS — I2510 Atherosclerotic heart disease of native coronary artery without angina pectoris: Secondary | ICD-10-CM | POA: Diagnosis not present

## 2016-09-12 DIAGNOSIS — J449 Chronic obstructive pulmonary disease, unspecified: Secondary | ICD-10-CM | POA: Diagnosis not present

## 2016-09-12 DIAGNOSIS — S0990XD Unspecified injury of head, subsequent encounter: Secondary | ICD-10-CM | POA: Diagnosis not present

## 2016-09-12 DIAGNOSIS — I10 Essential (primary) hypertension: Secondary | ICD-10-CM | POA: Diagnosis not present

## 2016-09-12 DIAGNOSIS — E1151 Type 2 diabetes mellitus with diabetic peripheral angiopathy without gangrene: Secondary | ICD-10-CM | POA: Diagnosis not present

## 2016-09-12 DIAGNOSIS — S2241XD Multiple fractures of ribs, right side, subsequent encounter for fracture with routine healing: Secondary | ICD-10-CM | POA: Diagnosis not present

## 2016-09-18 DIAGNOSIS — I10 Essential (primary) hypertension: Secondary | ICD-10-CM | POA: Diagnosis not present

## 2016-09-18 DIAGNOSIS — E1151 Type 2 diabetes mellitus with diabetic peripheral angiopathy without gangrene: Secondary | ICD-10-CM | POA: Diagnosis not present

## 2016-09-18 DIAGNOSIS — S0990XD Unspecified injury of head, subsequent encounter: Secondary | ICD-10-CM | POA: Diagnosis not present

## 2016-09-18 DIAGNOSIS — I2510 Atherosclerotic heart disease of native coronary artery without angina pectoris: Secondary | ICD-10-CM | POA: Diagnosis not present

## 2016-09-18 DIAGNOSIS — S2241XD Multiple fractures of ribs, right side, subsequent encounter for fracture with routine healing: Secondary | ICD-10-CM | POA: Diagnosis not present

## 2016-09-18 DIAGNOSIS — J449 Chronic obstructive pulmonary disease, unspecified: Secondary | ICD-10-CM | POA: Diagnosis not present

## 2016-09-20 DIAGNOSIS — E1151 Type 2 diabetes mellitus with diabetic peripheral angiopathy without gangrene: Secondary | ICD-10-CM | POA: Diagnosis not present

## 2016-09-20 DIAGNOSIS — S2241XD Multiple fractures of ribs, right side, subsequent encounter for fracture with routine healing: Secondary | ICD-10-CM | POA: Diagnosis not present

## 2016-09-20 DIAGNOSIS — I10 Essential (primary) hypertension: Secondary | ICD-10-CM | POA: Diagnosis not present

## 2016-09-20 DIAGNOSIS — J449 Chronic obstructive pulmonary disease, unspecified: Secondary | ICD-10-CM | POA: Diagnosis not present

## 2016-09-20 DIAGNOSIS — S0990XD Unspecified injury of head, subsequent encounter: Secondary | ICD-10-CM | POA: Diagnosis not present

## 2016-09-20 DIAGNOSIS — I2510 Atherosclerotic heart disease of native coronary artery without angina pectoris: Secondary | ICD-10-CM | POA: Diagnosis not present

## 2016-09-22 DIAGNOSIS — E1151 Type 2 diabetes mellitus with diabetic peripheral angiopathy without gangrene: Secondary | ICD-10-CM | POA: Diagnosis not present

## 2016-09-22 DIAGNOSIS — I2510 Atherosclerotic heart disease of native coronary artery without angina pectoris: Secondary | ICD-10-CM | POA: Diagnosis not present

## 2016-09-22 DIAGNOSIS — I10 Essential (primary) hypertension: Secondary | ICD-10-CM | POA: Diagnosis not present

## 2016-09-22 DIAGNOSIS — S2241XD Multiple fractures of ribs, right side, subsequent encounter for fracture with routine healing: Secondary | ICD-10-CM | POA: Diagnosis not present

## 2016-09-22 DIAGNOSIS — J449 Chronic obstructive pulmonary disease, unspecified: Secondary | ICD-10-CM | POA: Diagnosis not present

## 2016-09-22 DIAGNOSIS — S0990XD Unspecified injury of head, subsequent encounter: Secondary | ICD-10-CM | POA: Diagnosis not present

## 2016-09-25 DIAGNOSIS — J449 Chronic obstructive pulmonary disease, unspecified: Secondary | ICD-10-CM | POA: Diagnosis not present

## 2016-09-25 DIAGNOSIS — E1151 Type 2 diabetes mellitus with diabetic peripheral angiopathy without gangrene: Secondary | ICD-10-CM | POA: Diagnosis not present

## 2016-09-25 DIAGNOSIS — I10 Essential (primary) hypertension: Secondary | ICD-10-CM | POA: Diagnosis not present

## 2016-09-25 DIAGNOSIS — S0990XD Unspecified injury of head, subsequent encounter: Secondary | ICD-10-CM | POA: Diagnosis not present

## 2016-09-25 DIAGNOSIS — I2510 Atherosclerotic heart disease of native coronary artery without angina pectoris: Secondary | ICD-10-CM | POA: Diagnosis not present

## 2016-09-25 DIAGNOSIS — S2241XD Multiple fractures of ribs, right side, subsequent encounter for fracture with routine healing: Secondary | ICD-10-CM | POA: Diagnosis not present

## 2016-10-03 ENCOUNTER — Ambulatory Visit (INDEPENDENT_AMBULATORY_CARE_PROVIDER_SITE_OTHER): Payer: Medicare Other | Admitting: Cardiovascular Disease

## 2016-10-03 ENCOUNTER — Encounter: Payer: Self-pay | Admitting: Cardiovascular Disease

## 2016-10-03 DIAGNOSIS — I2583 Coronary atherosclerosis due to lipid rich plaque: Secondary | ICD-10-CM

## 2016-10-03 DIAGNOSIS — J410 Simple chronic bronchitis: Secondary | ICD-10-CM | POA: Diagnosis not present

## 2016-10-03 DIAGNOSIS — E1151 Type 2 diabetes mellitus with diabetic peripheral angiopathy without gangrene: Secondary | ICD-10-CM | POA: Diagnosis not present

## 2016-10-03 DIAGNOSIS — S2241XD Multiple fractures of ribs, right side, subsequent encounter for fracture with routine healing: Secondary | ICD-10-CM | POA: Diagnosis not present

## 2016-10-03 DIAGNOSIS — I2510 Atherosclerotic heart disease of native coronary artery without angina pectoris: Secondary | ICD-10-CM | POA: Diagnosis not present

## 2016-10-03 DIAGNOSIS — I739 Peripheral vascular disease, unspecified: Secondary | ICD-10-CM

## 2016-10-03 DIAGNOSIS — S0990XD Unspecified injury of head, subsequent encounter: Secondary | ICD-10-CM | POA: Diagnosis not present

## 2016-10-03 DIAGNOSIS — I10 Essential (primary) hypertension: Secondary | ICD-10-CM

## 2016-10-03 DIAGNOSIS — J449 Chronic obstructive pulmonary disease, unspecified: Secondary | ICD-10-CM | POA: Diagnosis not present

## 2016-10-03 NOTE — Assessment & Plan Note (Signed)
History of COPD having quit tobacco abuse 16 years ago.

## 2016-10-03 NOTE — Patient Instructions (Signed)

## 2016-10-03 NOTE — Assessment & Plan Note (Signed)
History of essential hypertension blood pressure measured today initially 168/92 with follow-up blood pressure measuring 150/94. He is on Benicar. Continue current meds at current dosing.

## 2016-10-03 NOTE — Assessment & Plan Note (Signed)
History of peripheral arterial disease with lower extremity Dopplers that showed bilateral SFA disease although he really denies claudication.

## 2016-10-03 NOTE — Progress Notes (Signed)
10/03/2016 Ernest Clark.   Mar 28, 1931  [id number]  Primary Physician Hulan Fess, MD Primary Cardiologist: Lorretta Harp MD Garret Reddish, [place], [place]  HPI:  Ernest Clark. is a 83 y.o. male moderately overweight divorced Caucasian male father of 2 children, grandfather high grandchildren referred by Dr. Amalia Hailey, podiatrist, for peripheral vascular evaluation because of claudication. I last saw him in the office 04/07/15. He is retired at working as a Furniture conservator/restorer at [employer]. His primary care physician is Dr. Hulan Fess. His cardiac risk factor profile is notable for over 100 pack years of tobacco abuse having quit 5 years ago as well as history of hypertension, diabetes or hyperlipidemia. He has never had a heart attack or stroke. He denies chest pain or shortness of breath. He has had a remote PCI. He also has what sounds like diabetic peripheral neuropathy with burning in his feet. He had Dopplers in our office performed 08/27/13 revealing a right ABI 0.65 with moderate SFA disease, left ABI 0.51 with occluded distal left SFA. He really does not complain of calf claudication. He recently fell and sustained a fractured bone. Since I saw him in the office yearly half ago he did fall and fracture several ribs. He was in [facility] for rehabilitation for 5 weeks.  Current Meds  Medication Sig  . acetaminophen (TYLENOL) 500 MG tablet Take 1,000 mg by mouth every 6 (six) hours as needed.  Marland Kitchen albuterol (PROAIR HFA) 108 (90 BASE) MCG/ACT inhaler Inhale 2 puffs into the lungs every 6 (six) hours as needed for wheezing or shortness of breath.   . allopurinol (ZYLOPRIM) 100 MG tablet TAKE 1 TABLET (100MG ) DAILY  . aspirin 81 MG tablet Take 81 mg by mouth daily.   . cetirizine (ZYRTEC) 10 MG tablet Take 10 mg by mouth daily as needed for allergies.  Marland Kitchen docusate sodium (STOOL SOFTENER) 100 MG capsule Take 100 mg by mouth 2 (two) times daily.  . DULoxetine (CYMBALTA) 30 MG capsule Take 30 mg by  mouth daily.  . Ferrous Sulfate (IRON) 325 (65 Fe) MG TABS Take 325 mg by mouth daily.  . furosemide (LASIX) 40 MG tablet TAKE 1 TABLET (40MG ) BY MOUTH DAILY  . guaiFENesin-dextromethorphan (ROBITUSSIN DM) 100-10 MG/5ML syrup Take 5 mLs by mouth every 4 (four) hours as needed for cough.  . Insulin Degludec (TRESIBA FLEXTOUCH [place]) Inject 26 Units into the skin every morning.  . insulin lispro (HUMALOG) 100 UNIT/ML injection Inject 8 Units into the skin daily.  Marland Kitchen levothyroxine (SYNTHROID, LEVOTHROID) 25 MCG tablet Take 25 mcg by mouth daily before breakfast.  . Melatonin 10 MG TABS Take 10 mg by mouth at bedtime.  . Mometasone Furo-Formoterol Fum (DULERA) 200-5 MCG/ACT AERO Inhale 2 puffs into the lungs 2 (two) times daily.  Marland Kitchen olmesartan (BENICAR) 40 MG tablet One half daily (Patient taking differently: Take 20 mg by mouth daily. )  . omeprazole (PRILOSEC) 20 MG capsule Take 20 mg by mouth 2 (two) times daily.  . polyethylene glycol (MIRALAX / GLYCOLAX) packet Take 17 g by mouth daily as needed for moderate constipation.  . rosuvastatin (CRESTOR) 40 MG tablet Take 40 mg by mouth daily.    . sitaGLIPtin (JANUVIA) 100 MG tablet Take 100 mg by mouth daily.     Allergies  Allergen Reactions  . Actos [Pioglitazone Hydrochloride] Other (See Comments)    RIGHT LEG SWELLING   . Metformin And Related Other (See Comments)    Social History  Social History  . Marital status: Divorced    Spouse name: N/A  . Number of children: 2  . Years of education: N/A   Occupational History  . retired from [employer]  . Smoking status: Former Smoker    Packs/day: 2.00    Years: 60.00    Types: Cigarettes    Quit date: 01/02/2001  . Smokeless tobacco: Former Systems developer    Types: Chew  . Alcohol use No  . Drug use: No  . Sexual activity: Not on file   Other Topics Concern  . Not on file   Social History Narrative  . No narrative on file     Review of Systems: General:  negative for chills, fever, night sweats or weight changes.  Cardiovascular: negative for chest pain, dyspnea on exertion, edema, orthopnea, palpitations, paroxysmal nocturnal dyspnea or shortness of breath Dermatological: negative for rash Respiratory: negative for cough or wheezing Urologic: negative for hematuria Abdominal: negative for nausea, vomiting, diarrhea, bright red blood per rectum, melena, or hematemesis Neurologic: negative for visual changes, syncope, or dizziness All other systems reviewed and are otherwise negative except as noted above.    Blood pressure (!) 168/92, pulse 92, height 5\' 8"  (1.727 m), weight 227 lb (103 kg).  General appearance: alert and no distress Neck: no adenopathy, no carotid bruit, no JVD, supple, symmetrical, trachea midline and thyroid not enlarged, symmetric, no tenderness/mass/nodules Lungs: clear to auscultation bilaterally Heart: regular rate and rhythm, S1, S2 normal, no murmur, click, rub or gallop Extremities: extremities normal, atraumatic, no cyanosis or edema Pulses: 2+ and symmetric Skin: Skin color, texture, turgor normal. No rashes or lesions Neurologic: Alert and oriented X 3, normal strength and tone. Normal symmetric reflexes. Normal coordination and gait  EKG not performed today  ASSESSMENT AND PLAN:   COPD (chronic obstructive pulmonary disease) ([place]) History of COPD having quit tobacco abuse 16 years ago.  Peripheral arterial disease (HCC) History of peripheral arterial disease with lower extremity Dopplers that showed bilateral SFA disease although he really denies claudication.  Coronary artery disease due to lipid rich plaque History of CAD status post remote PCI. Patient denies chest pain.  Essential hypertension History of essential hypertension blood pressure measured today initially 168/92 with follow-up blood pressure measuring 150/94. He is on Benicar. Continue current meds at current dosing.      Lorretta Harp MD FACP,FACC,FAHA, [facility] 10/03/2016 11:19 AM

## 2016-10-03 NOTE — Assessment & Plan Note (Signed)
History of CAD status post remote PCI. Patient denies chest pain.

## 2016-10-05 DIAGNOSIS — S2241XD Multiple fractures of ribs, right side, subsequent encounter for fracture with routine healing: Secondary | ICD-10-CM | POA: Diagnosis not present

## 2016-10-05 DIAGNOSIS — J449 Chronic obstructive pulmonary disease, unspecified: Secondary | ICD-10-CM | POA: Diagnosis not present

## 2016-10-05 DIAGNOSIS — I2510 Atherosclerotic heart disease of native coronary artery without angina pectoris: Secondary | ICD-10-CM | POA: Diagnosis not present

## 2016-10-05 DIAGNOSIS — E1151 Type 2 diabetes mellitus with diabetic peripheral angiopathy without gangrene: Secondary | ICD-10-CM | POA: Diagnosis not present

## 2016-10-05 DIAGNOSIS — I10 Essential (primary) hypertension: Secondary | ICD-10-CM | POA: Diagnosis not present

## 2016-10-05 DIAGNOSIS — S0990XD Unspecified injury of head, subsequent encounter: Secondary | ICD-10-CM | POA: Diagnosis not present

## 2016-10-06 DIAGNOSIS — J449 Chronic obstructive pulmonary disease, unspecified: Secondary | ICD-10-CM | POA: Diagnosis not present

## 2016-10-06 DIAGNOSIS — S0990XD Unspecified injury of head, subsequent encounter: Secondary | ICD-10-CM | POA: Diagnosis not present

## 2016-10-06 DIAGNOSIS — I2510 Atherosclerotic heart disease of native coronary artery without angina pectoris: Secondary | ICD-10-CM | POA: Diagnosis not present

## 2016-10-06 DIAGNOSIS — E1151 Type 2 diabetes mellitus with diabetic peripheral angiopathy without gangrene: Secondary | ICD-10-CM | POA: Diagnosis not present

## 2016-10-06 DIAGNOSIS — S2241XD Multiple fractures of ribs, right side, subsequent encounter for fracture with routine healing: Secondary | ICD-10-CM | POA: Diagnosis not present

## 2016-10-06 DIAGNOSIS — I10 Essential (primary) hypertension: Secondary | ICD-10-CM | POA: Diagnosis not present

## 2016-10-09 DIAGNOSIS — S2241XD Multiple fractures of ribs, right side, subsequent encounter for fracture with routine healing: Secondary | ICD-10-CM | POA: Diagnosis not present

## 2016-10-09 DIAGNOSIS — E1151 Type 2 diabetes mellitus with diabetic peripheral angiopathy without gangrene: Secondary | ICD-10-CM | POA: Diagnosis not present

## 2016-10-09 DIAGNOSIS — I10 Essential (primary) hypertension: Secondary | ICD-10-CM | POA: Diagnosis not present

## 2016-10-09 DIAGNOSIS — J449 Chronic obstructive pulmonary disease, unspecified: Secondary | ICD-10-CM | POA: Diagnosis not present

## 2016-10-09 DIAGNOSIS — I2510 Atherosclerotic heart disease of native coronary artery without angina pectoris: Secondary | ICD-10-CM | POA: Diagnosis not present

## 2016-10-09 DIAGNOSIS — S0990XD Unspecified injury of head, subsequent encounter: Secondary | ICD-10-CM | POA: Diagnosis not present

## 2016-10-11 DIAGNOSIS — I10 Essential (primary) hypertension: Secondary | ICD-10-CM | POA: Diagnosis not present

## 2016-10-11 DIAGNOSIS — S0990XD Unspecified injury of head, subsequent encounter: Secondary | ICD-10-CM | POA: Diagnosis not present

## 2016-10-11 DIAGNOSIS — I2510 Atherosclerotic heart disease of native coronary artery without angina pectoris: Secondary | ICD-10-CM | POA: Diagnosis not present

## 2016-10-11 DIAGNOSIS — E1151 Type 2 diabetes mellitus with diabetic peripheral angiopathy without gangrene: Secondary | ICD-10-CM | POA: Diagnosis not present

## 2016-10-11 DIAGNOSIS — J449 Chronic obstructive pulmonary disease, unspecified: Secondary | ICD-10-CM | POA: Diagnosis not present

## 2016-10-11 DIAGNOSIS — S2241XD Multiple fractures of ribs, right side, subsequent encounter for fracture with routine healing: Secondary | ICD-10-CM | POA: Diagnosis not present

## 2016-10-17 DIAGNOSIS — E039 Hypothyroidism, unspecified: Secondary | ICD-10-CM | POA: Diagnosis not present

## 2016-10-17 DIAGNOSIS — E1142 Type 2 diabetes mellitus with diabetic polyneuropathy: Secondary | ICD-10-CM | POA: Diagnosis not present

## 2016-10-17 DIAGNOSIS — Z794 Long term (current) use of insulin: Secondary | ICD-10-CM | POA: Diagnosis not present

## 2016-10-17 DIAGNOSIS — R609 Edema, unspecified: Secondary | ICD-10-CM | POA: Diagnosis not present

## 2016-10-17 DIAGNOSIS — I739 Peripheral vascular disease, unspecified: Secondary | ICD-10-CM | POA: Diagnosis not present

## 2016-10-17 DIAGNOSIS — E063 Autoimmune thyroiditis: Secondary | ICD-10-CM | POA: Diagnosis not present

## 2016-10-24 DIAGNOSIS — E1151 Type 2 diabetes mellitus with diabetic peripheral angiopathy without gangrene: Secondary | ICD-10-CM | POA: Diagnosis not present

## 2016-10-24 DIAGNOSIS — I10 Essential (primary) hypertension: Secondary | ICD-10-CM | POA: Diagnosis not present

## 2016-10-24 DIAGNOSIS — S0990XD Unspecified injury of head, subsequent encounter: Secondary | ICD-10-CM | POA: Diagnosis not present

## 2016-10-24 DIAGNOSIS — S2241XD Multiple fractures of ribs, right side, subsequent encounter for fracture with routine healing: Secondary | ICD-10-CM | POA: Diagnosis not present

## 2016-10-24 DIAGNOSIS — I2510 Atherosclerotic heart disease of native coronary artery without angina pectoris: Secondary | ICD-10-CM | POA: Diagnosis not present

## 2016-10-24 DIAGNOSIS — J449 Chronic obstructive pulmonary disease, unspecified: Secondary | ICD-10-CM | POA: Diagnosis not present

## 2016-10-25 DIAGNOSIS — S2241XD Multiple fractures of ribs, right side, subsequent encounter for fracture with routine healing: Secondary | ICD-10-CM | POA: Diagnosis not present

## 2016-10-25 DIAGNOSIS — J449 Chronic obstructive pulmonary disease, unspecified: Secondary | ICD-10-CM | POA: Diagnosis not present

## 2016-10-25 DIAGNOSIS — E1151 Type 2 diabetes mellitus with diabetic peripheral angiopathy without gangrene: Secondary | ICD-10-CM | POA: Diagnosis not present

## 2016-10-25 DIAGNOSIS — I2510 Atherosclerotic heart disease of native coronary artery without angina pectoris: Secondary | ICD-10-CM | POA: Diagnosis not present

## 2016-10-25 DIAGNOSIS — I10 Essential (primary) hypertension: Secondary | ICD-10-CM | POA: Diagnosis not present

## 2016-10-25 DIAGNOSIS — S0990XD Unspecified injury of head, subsequent encounter: Secondary | ICD-10-CM | POA: Diagnosis not present

## 2016-10-26 DIAGNOSIS — K219 Gastro-esophageal reflux disease without esophagitis: Secondary | ICD-10-CM | POA: Diagnosis not present

## 2016-10-26 DIAGNOSIS — E1151 Type 2 diabetes mellitus with diabetic peripheral angiopathy without gangrene: Secondary | ICD-10-CM | POA: Diagnosis not present

## 2016-10-26 DIAGNOSIS — M109 Gout, unspecified: Secondary | ICD-10-CM | POA: Diagnosis not present

## 2016-10-26 DIAGNOSIS — I10 Essential (primary) hypertension: Secondary | ICD-10-CM | POA: Diagnosis not present

## 2016-10-26 DIAGNOSIS — Z8781 Personal history of (healed) traumatic fracture: Secondary | ICD-10-CM | POA: Diagnosis not present

## 2016-10-26 DIAGNOSIS — K047 Periapical abscess without sinus: Secondary | ICD-10-CM | POA: Diagnosis not present

## 2016-10-26 DIAGNOSIS — Z7984 Long term (current) use of oral hypoglycemic drugs: Secondary | ICD-10-CM | POA: Diagnosis not present

## 2016-10-26 DIAGNOSIS — I2583 Coronary atherosclerosis due to lipid rich plaque: Secondary | ICD-10-CM | POA: Diagnosis not present

## 2016-10-26 DIAGNOSIS — E785 Hyperlipidemia, unspecified: Secondary | ICD-10-CM | POA: Diagnosis not present

## 2016-10-26 DIAGNOSIS — Z7982 Long term (current) use of aspirin: Secondary | ICD-10-CM | POA: Diagnosis not present

## 2016-10-26 DIAGNOSIS — I2510 Atherosclerotic heart disease of native coronary artery without angina pectoris: Secondary | ICD-10-CM | POA: Diagnosis not present

## 2016-10-26 DIAGNOSIS — J449 Chronic obstructive pulmonary disease, unspecified: Secondary | ICD-10-CM | POA: Diagnosis not present

## 2016-11-02 DIAGNOSIS — J449 Chronic obstructive pulmonary disease, unspecified: Secondary | ICD-10-CM | POA: Diagnosis not present

## 2016-11-02 DIAGNOSIS — I2583 Coronary atherosclerosis due to lipid rich plaque: Secondary | ICD-10-CM | POA: Diagnosis not present

## 2016-11-02 DIAGNOSIS — I10 Essential (primary) hypertension: Secondary | ICD-10-CM | POA: Diagnosis not present

## 2016-11-02 DIAGNOSIS — E1151 Type 2 diabetes mellitus with diabetic peripheral angiopathy without gangrene: Secondary | ICD-10-CM | POA: Diagnosis not present

## 2016-11-02 DIAGNOSIS — K047 Periapical abscess without sinus: Secondary | ICD-10-CM | POA: Diagnosis not present

## 2016-11-02 DIAGNOSIS — I2510 Atherosclerotic heart disease of native coronary artery without angina pectoris: Secondary | ICD-10-CM | POA: Diagnosis not present

## 2016-11-08 DIAGNOSIS — I2583 Coronary atherosclerosis due to lipid rich plaque: Secondary | ICD-10-CM | POA: Diagnosis not present

## 2016-11-08 DIAGNOSIS — J449 Chronic obstructive pulmonary disease, unspecified: Secondary | ICD-10-CM | POA: Diagnosis not present

## 2016-11-08 DIAGNOSIS — E1151 Type 2 diabetes mellitus with diabetic peripheral angiopathy without gangrene: Secondary | ICD-10-CM | POA: Diagnosis not present

## 2016-11-08 DIAGNOSIS — I10 Essential (primary) hypertension: Secondary | ICD-10-CM | POA: Diagnosis not present

## 2016-11-08 DIAGNOSIS — K047 Periapical abscess without sinus: Secondary | ICD-10-CM | POA: Diagnosis not present

## 2016-11-08 DIAGNOSIS — I2510 Atherosclerotic heart disease of native coronary artery without angina pectoris: Secondary | ICD-10-CM | POA: Diagnosis not present

## 2016-11-09 DIAGNOSIS — R911 Solitary pulmonary nodule: Secondary | ICD-10-CM | POA: Diagnosis not present

## 2016-11-09 DIAGNOSIS — J449 Chronic obstructive pulmonary disease, unspecified: Secondary | ICD-10-CM | POA: Diagnosis not present

## 2016-11-09 DIAGNOSIS — I25119 Atherosclerotic heart disease of native coronary artery with unspecified angina pectoris: Secondary | ICD-10-CM | POA: Diagnosis not present

## 2016-11-09 DIAGNOSIS — E1140 Type 2 diabetes mellitus with diabetic neuropathy, unspecified: Secondary | ICD-10-CM | POA: Diagnosis not present

## 2016-11-09 DIAGNOSIS — I739 Peripheral vascular disease, unspecified: Secondary | ICD-10-CM | POA: Diagnosis not present

## 2016-11-09 DIAGNOSIS — Z6834 Body mass index (BMI) 34.0-34.9, adult: Secondary | ICD-10-CM | POA: Diagnosis not present

## 2016-11-09 DIAGNOSIS — I10 Essential (primary) hypertension: Secondary | ICD-10-CM | POA: Diagnosis not present

## 2016-11-09 DIAGNOSIS — E669 Obesity, unspecified: Secondary | ICD-10-CM | POA: Diagnosis not present

## 2016-11-09 DIAGNOSIS — E039 Hypothyroidism, unspecified: Secondary | ICD-10-CM | POA: Diagnosis not present

## 2016-11-09 DIAGNOSIS — E871 Hypo-osmolality and hyponatremia: Secondary | ICD-10-CM | POA: Diagnosis not present

## 2016-11-16 DIAGNOSIS — R350 Frequency of micturition: Secondary | ICD-10-CM | POA: Diagnosis not present

## 2016-11-16 DIAGNOSIS — R972 Elevated prostate specific antigen [PSA]: Secondary | ICD-10-CM | POA: Diagnosis not present

## 2016-11-16 DIAGNOSIS — N401 Enlarged prostate with lower urinary tract symptoms: Secondary | ICD-10-CM | POA: Diagnosis not present

## 2016-11-17 DIAGNOSIS — E1151 Type 2 diabetes mellitus with diabetic peripheral angiopathy without gangrene: Secondary | ICD-10-CM | POA: Diagnosis not present

## 2016-11-17 DIAGNOSIS — I2510 Atherosclerotic heart disease of native coronary artery without angina pectoris: Secondary | ICD-10-CM | POA: Diagnosis not present

## 2016-11-17 DIAGNOSIS — I10 Essential (primary) hypertension: Secondary | ICD-10-CM | POA: Diagnosis not present

## 2016-11-17 DIAGNOSIS — K047 Periapical abscess without sinus: Secondary | ICD-10-CM | POA: Diagnosis not present

## 2016-11-17 DIAGNOSIS — J449 Chronic obstructive pulmonary disease, unspecified: Secondary | ICD-10-CM | POA: Diagnosis not present

## 2016-11-17 DIAGNOSIS — I2583 Coronary atherosclerosis due to lipid rich plaque: Secondary | ICD-10-CM | POA: Diagnosis not present

## 2016-11-29 DIAGNOSIS — E1151 Type 2 diabetes mellitus with diabetic peripheral angiopathy without gangrene: Secondary | ICD-10-CM | POA: Diagnosis not present

## 2016-11-29 DIAGNOSIS — J449 Chronic obstructive pulmonary disease, unspecified: Secondary | ICD-10-CM | POA: Diagnosis not present

## 2016-11-29 DIAGNOSIS — K047 Periapical abscess without sinus: Secondary | ICD-10-CM | POA: Diagnosis not present

## 2016-11-29 DIAGNOSIS — I10 Essential (primary) hypertension: Secondary | ICD-10-CM | POA: Diagnosis not present

## 2016-11-29 DIAGNOSIS — I2510 Atherosclerotic heart disease of native coronary artery without angina pectoris: Secondary | ICD-10-CM | POA: Diagnosis not present

## 2016-11-29 DIAGNOSIS — I2583 Coronary atherosclerosis due to lipid rich plaque: Secondary | ICD-10-CM | POA: Diagnosis not present

## 2016-12-04 ENCOUNTER — Ambulatory Visit (INDEPENDENT_AMBULATORY_CARE_PROVIDER_SITE_OTHER): Payer: Medicare Other | Admitting: Podiatry

## 2016-12-04 ENCOUNTER — Encounter: Payer: Self-pay | Admitting: Podiatry

## 2016-12-04 DIAGNOSIS — M79675 Pain in left toe(s): Secondary | ICD-10-CM | POA: Diagnosis not present

## 2016-12-04 DIAGNOSIS — B351 Tinea unguium: Secondary | ICD-10-CM | POA: Diagnosis not present

## 2016-12-04 DIAGNOSIS — M79674 Pain in right toe(s): Secondary | ICD-10-CM | POA: Diagnosis not present

## 2016-12-04 DIAGNOSIS — I739 Peripheral vascular disease, unspecified: Secondary | ICD-10-CM

## 2016-12-04 DIAGNOSIS — E1142 Type 2 diabetes mellitus with diabetic polyneuropathy: Secondary | ICD-10-CM

## 2016-12-04 NOTE — Progress Notes (Signed)
Patient ID: Ernest Clark., male   DOB: 12-30-1931, 83 y.o.   MRN: [id number]   Subjective: This patient presents today complaining of elongated and thickened toenails and requests toenail debridement. He states that the nails are cough and walking wearing shoes Patient is a diabetic with a known history of peripheral arterial disease that has been evaluated by Dr. Gwenlyn Found The patient's son is present in the treatment room today  Objective Vascular: Bilateral lower extremitypittingedema Tenderness palpation bilaterally DP and PT pulses 0/4 bilaterally Capillary reflex leg bilaterally  Neurological: Sensation to 10 g monofilament wire intact 4/5 right 1/5 left Vibratory sensation reactive right nonreactive left Ankle reflexes equal reactive bilaterally   Dermatological: Atrophic skin with absent hair growth bilaterally No open skin lesions bilaterally Corn distal third right toe Plantar callus fifth MPJ right Right ankle appears more edematous than the left The toenails are brittle hypertrophic, deformed and tender to direct palpation 6-10 The fifth left toenailis partially detached from the nailbed distally,There is no surrounding erythema, edema, warmth, drainage from thefifthleft toenail. The entire fifth nail plates left plates left after debridement  Musculoskeletal: HAV right Hammertoe second right  Assessment: Diabetic Peripheral neuropathy Peripheral vascular disease Symptomatic onychomycoses 6-10   Plan: Debridement of toenails 6-10 mechanically and electrically without anybleeding Debride corn third right toe without any bleeding  Reappoint 3 months

## 2016-12-04 NOTE — Patient Instructions (Signed)

## 2016-12-21 DIAGNOSIS — E039 Hypothyroidism, unspecified: Secondary | ICD-10-CM | POA: Diagnosis not present

## 2017-02-08 DIAGNOSIS — E039 Hypothyroidism, unspecified: Secondary | ICD-10-CM | POA: Diagnosis not present

## 2017-02-14 DIAGNOSIS — E119 Type 2 diabetes mellitus without complications: Secondary | ICD-10-CM | POA: Diagnosis not present

## 2017-02-14 DIAGNOSIS — H5202 Hypermetropia, left eye: Secondary | ICD-10-CM | POA: Diagnosis not present

## 2017-02-14 DIAGNOSIS — H2513 Age-related nuclear cataract, bilateral: Secondary | ICD-10-CM | POA: Diagnosis not present

## 2017-02-14 DIAGNOSIS — H52222 Regular astigmatism, left eye: Secondary | ICD-10-CM | POA: Diagnosis not present

## 2017-03-06 ENCOUNTER — Ambulatory Visit (INDEPENDENT_AMBULATORY_CARE_PROVIDER_SITE_OTHER): Payer: Medicare Other | Admitting: Podiatry

## 2017-03-06 ENCOUNTER — Ambulatory Visit: Payer: Medicare Other | Admitting: Podiatry

## 2017-03-06 ENCOUNTER — Encounter: Payer: Self-pay | Admitting: Podiatry

## 2017-03-06 DIAGNOSIS — M79675 Pain in left toe(s): Secondary | ICD-10-CM | POA: Diagnosis not present

## 2017-03-06 DIAGNOSIS — M79674 Pain in right toe(s): Secondary | ICD-10-CM | POA: Diagnosis not present

## 2017-03-06 DIAGNOSIS — B351 Tinea unguium: Secondary | ICD-10-CM | POA: Diagnosis not present

## 2017-03-06 DIAGNOSIS — E1142 Type 2 diabetes mellitus with diabetic polyneuropathy: Secondary | ICD-10-CM

## 2017-03-06 NOTE — Progress Notes (Signed)
Complaint:  Visit Type: Patient returns to my office for continued preventative foot care services. Complaint: Patient states" my nails have grown long and thick and become painful to walk and wear shoes" Patient has been diagnosed with DM with pvd.. The patient presents for preventative foot care services. No changes to ROS  Podiatric Exam: Vascular: dorsalis pedis and posterior tibial pulses are not  palpable bilateral. Capillary return is immediate. Temperature gradient is WNL. Skin turgor WNL Venous stasis feet and legs. Sensorium: Diminished  Semmes Weinstein monofilament test. Normal tactile sensation bilaterally. Nail Exam: Pt has thick disfigured discolored nails with subungual debris noted bilateral entire nail hallux through fifth toenails Ulcer Exam: There is no evidence of ulcer or pre-ulcerative changes or infection. Orthopedic Exam: Muscle tone and strength are WNL. No limitations in general ROM. No crepitus or effusions noted. HAV with hammer toe right foot. Skin: No Porokeratosis. No infection or ulcers  Diagnosis:  Onychomycosis, , Pain in right toe, pain in left toes  Treatment & Plan Procedures and Treatment: Consent by patient was obtained for treatment procedures.   Debridement of mycotic and hypertrophic toenails, 1 through 5 bilateral and clearing of subungual debris. No ulceration, no infection noted.  Return Visit-Office Procedure: Patient instructed to return to the office for a follow up visit 3 months for continued evaluation and treatment.    Gardiner Barefoot DPM

## 2017-03-08 ENCOUNTER — Ambulatory Visit (INDEPENDENT_AMBULATORY_CARE_PROVIDER_SITE_OTHER): Payer: Medicare Other | Admitting: Emergency Medicine

## 2017-03-08 ENCOUNTER — Encounter: Payer: Self-pay | Admitting: Emergency Medicine

## 2017-03-08 DIAGNOSIS — J410 Simple chronic bronchitis: Secondary | ICD-10-CM | POA: Diagnosis not present

## 2017-03-08 DIAGNOSIS — J309 Allergic rhinitis, unspecified: Secondary | ICD-10-CM | POA: Insufficient documentation

## 2017-03-08 DIAGNOSIS — R911 Solitary pulmonary nodule: Secondary | ICD-10-CM

## 2017-03-08 MED ORDER — UMECLIDINIUM-VILANTEROL 62.5-25 MCG/INH IN AEPB: 1.0000 | INHALATION_SPRAY | Freq: Every day | RESPIRATORY_TRACT | 0 refills | Status: DC

## 2017-03-08 NOTE — Patient Instructions (Signed)
Temporarily stop Dulera We will try starting Anoro 1 inhalation once a day.  If you benefit from this medication then we will continue it and order it through your pharmacy. Keep Pro Air available to use 2 puffs if needed for shortness of breath, wheezing, chest tightness. Walking oximetry today.  If you qualify then we will arrange for oxygen for you to use at home when you are exerting yourself. Please start taking your Zyrtec once a day every day at least during the allergy season. Consider starting fluticasone nasal spray (generic Flonase) 2 sprays each nostril once a day. We will perform full pulmonary function testing at your follow-up visit. We will arrange for a CT scan of your chest without contrast to follow your pulmonary nodule to be done in July 2019. Follow with Dr Lamonte Sakai in 1 month or next available with full PFT on the same day

## 2017-03-08 NOTE — Progress Notes (Signed)
Subjective:    Patient ID: Pauline Aus., male    DOB: [birth year], 84 y.o.   MRN: [id number]  HPI 84 year old former smoker with a history of coronary artery disease, hypertension, hyperlipidemia, diabetes type 2, hypothyroidism, peripheral vascular disease.  He has been seen in our office before by Dr. Melvyn Novas, carries a diagnosis of COPD.  Pulmonary function testing from 01/2011 was reviewed.  This showed moderately severe obstruction with a positive bronchodilator response, borderline hyperinflation and normal diffusion capacity.   He is referred back today for evaluation of his COPD.  He is currently managed on [facility], he believes that. He has exertional SOB that is progressively worse. He is worse during heavy allergy season - has wheeze. Occasional cough, white phlegm. Having more sinus drainage for the last few weeks, impacting his breathing. He is able to walk about 1/2 a block. Able to walk through a store.  No pred or abx. He uses zyrtec prn. n                                                                                                                              He had a CT scan of his chest 07/04/16 that I reviewed.  This was done after a mechanical fall.  There were slightly displaced right fifth and sixth rib fractures without pneumothorax, and a 4 mm right apical nodule.    Review of Systems  Constitutional: Negative for fever and unexpected weight change.  HENT: Negative for congestion, dental problem, ear pain, nosebleeds, postnasal drip, rhinorrhea, sinus pressure, sneezing, sore throat and trouble swallowing.   Eyes: Negative for redness and itching.  Respiratory: Negative for cough, chest tightness, shortness of breath and wheezing.   Cardiovascular: Negative for palpitations and leg swelling.  Gastrointestinal: Negative for nausea and vomiting.  Genitourinary: Negative for dysuria.  Musculoskeletal: Negative for joint swelling.  Skin: Negative for rash.  Neurological:  Negative for headaches.  Hematological: Does not bruise/bleed easily.  Psychiatric/Behavioral: Negative for dysphoric mood. The patient is not nervous/anxious.     Past Medical History:  Diagnosis Date  . COPD (chronic obstructive pulmonary disease) ([place])   . Coronary heart disease    remote PCI  . Diabetes mellitus   . DVT (deep venous thrombosis) ([place]) 2011  . Gout   . Hyperlipidemia   . Hypertension   . Peripheral arterial disease (HCC)      Family History  Problem Relation Age of Onset  . Diabetes Father   . Diabetes Mother   . Diabetes Brother   . Diabetes Brother      Social History   Socioeconomic History  . Marital status: Divorced    Spouse name: Not on file  . Number of children: 2  . Years of education: Not on file  . Highest education level: Not on file  Social Needs  . Financial resource strain: Not on file  . Food insecurity - worry: Not on  file  . Food insecurity - inability: Not on file  . Transportation needs - medical: Not on file  . Transportation needs - non-medical: Not on file  Occupational History  . Occupation: retired from [employer]  . Smoking status: Former Smoker    Packs/day: 2.00    Years: 60.00    Pack years: 120.00    Types: Cigarettes    Last attempt to quit: 01/02/2001    Years since quitting: 16.1  . Smokeless tobacco: Former Systems developer    Types: Chew  Substance and Sexual Activity  . Alcohol use: No  . Drug use: No  . Sexual activity: Not on file  Other Topics Concern  . Not on file  Social History Narrative  . Not on file     Allergies  Allergen Reactions  . Actos [Pioglitazone Hydrochloride] Other (See Comments)    RIGHT LEG SWELLING   . Metformin And Related Other (See Comments)     Outpatient Medications Prior to Visit  Medication Sig Dispense Refill  . acetaminophen (TYLENOL) 500 MG tablet Take 1,000 mg by mouth every 6 (six) hours as needed.    Marland Kitchen albuterol (PROAIR HFA) 108 (90 BASE) MCG/ACT inhaler  Inhale 2 puffs into the lungs every 6 (six) hours as needed for wheezing or shortness of breath.     . allopurinol (ZYLOPRIM) 100 MG tablet TAKE 1 TABLET (100MG ) DAILY  12  . aspirin 81 MG tablet Take 81 mg by mouth daily.     . cetirizine (ZYRTEC) 10 MG tablet Take 10 mg by mouth daily as needed for allergies.    Marland Kitchen docusate sodium (STOOL SOFTENER) 100 MG capsule Take 100 mg by mouth 2 (two) times daily.    . DULoxetine (CYMBALTA) 30 MG capsule Take 30 mg by mouth daily.    . Ferrous Sulfate (IRON) 325 (65 Fe) MG TABS Take 325 mg by mouth daily.    . furosemide (LASIX) 40 MG tablet TAKE 1 TABLET (40MG ) BY MOUTH DAILY  0  . guaiFENesin-dextromethorphan (ROBITUSSIN DM) 100-10 MG/5ML syrup Take 5 mLs by mouth every 4 (four) hours as needed for cough. 118 mL 0  . Insulin Degludec (TRESIBA FLEXTOUCH [place]) Inject 26 Units into the skin every morning.    . insulin lispro (HUMALOG) 100 UNIT/ML injection Inject 8 Units into the skin daily.    Marland Kitchen levothyroxine (SYNTHROID, LEVOTHROID) 25 MCG tablet Take 25 mcg by mouth daily before breakfast.    . Melatonin 10 MG TABS Take 10 mg by mouth at bedtime.    . Mometasone Furo-Formoterol Fum (DULERA) 200-5 MCG/ACT AERO Inhale 2 puffs into the lungs 2 (two) times daily. 1 Inhaler 11  . olmesartan (BENICAR) 40 MG tablet One half daily (Patient taking differently: Take 20 mg by mouth daily. ) 15 tablet 5  . omeprazole (PRILOSEC) 20 MG capsule Take 20 mg by mouth 2 (two) times daily.    . polyethylene glycol (MIRALAX / GLYCOLAX) packet Take 17 g by mouth daily as needed for moderate constipation. 14 each 0  . rosuvastatin (CRESTOR) 40 MG tablet Take 40 mg by mouth daily.      . sitaGLIPtin (JANUVIA) 100 MG tablet Take 100 mg by mouth daily.     No facility-administered medications prior to visit.         Objective:   Physical Exam Vitals:   03/08/17 1008  BP: 122/78  Pulse: (!) 104  SpO2: 94%  Weight: 237 lb (107.5 kg)  Height: 5'  8" (1.727 m)   Gen:  Pleasant, overweight elderly gentleman, in no distress,  normal affect  ENT: No lesions,  mouth clear,  oropharynx clear, no postnasal drip, very poor hearing without his hearing aids  Neck: No JVD, mild inspiratory and expiratory stridor  Lungs: No use of accessory muscles, bilateral soft expiratory wheezes  Cardiovascular: RRR, heart sounds normal, no murmur or gallops, no peripheral edema  Musculoskeletal: No deformities, no cyanosis or clubbing  Neuro: alert, non focal  Skin: Warm, no lesions or rash    Assessment & Plan:  COPD (chronic obstructive pulmonary disease) (HCC) Temporarily stop Dulera We will try starting Anoro 1 inhalation once a day.  If you benefit from this medication then we will continue it and order it through your pharmacy. Keep Pro Air available to use 2 puffs if needed for shortness of breath, wheezing, chest tightness. Walking oximetry today.  If you qualify then we will arrange for oxygen for you to use at home when you are exerting yourself. Follow with Dr Lamonte Sakai in 1 month or next available with full PFT on the same day  Allergic rhinitis Please start taking your Zyrtec once a day every day at least during the allergy season. Consider starting fluticasone nasal spray (generic Flonase) 2 sprays each nostril once a day.  Solitary pulmonary nodule Found spuriously on a CT scan of the chest.  4 mm nodule of unclear significance.  He does have a heavy smoking history.  We will plan to repeat his CT in July 2019 to look for interval change.  Baltazar Apo, MD, PhD 03/08/2017, 10:43 AM [facility] [phone] or if no answer [phone]

## 2017-03-08 NOTE — Assessment & Plan Note (Signed)
Temporarily stop Dulera We will try starting Anoro 1 inhalation once a day.  If you benefit from this medication then we will continue it and order it through your pharmacy. Keep Pro Air available to use 2 puffs if needed for shortness of breath, wheezing, chest tightness. Walking oximetry today.  If you qualify then we will arrange for oxygen for you to use at home when you are exerting yourself. Follow with Dr Lamonte Sakai in 1 month or next available with full PFT on the same day

## 2017-03-08 NOTE — Assessment & Plan Note (Signed)
Please start taking your Zyrtec once a day every day at least during the allergy season. Consider starting fluticasone nasal spray (generic Flonase) 2 sprays each nostril once a day.

## 2017-03-08 NOTE — Assessment & Plan Note (Signed)
Found spuriously on a CT scan of the chest.  4 mm nodule of unclear significance.  He does have a heavy smoking history.  We will plan to repeat his CT in July 2019 to look for interval change.

## 2017-03-08 NOTE — Progress Notes (Signed)
Patient seen in the office today and instructed on use of Anoro.  Patient expressed understanding and demonstrated technique. 

## 2017-03-22 DIAGNOSIS — E119 Type 2 diabetes mellitus without complications: Secondary | ICD-10-CM | POA: Diagnosis not present

## 2017-03-22 DIAGNOSIS — H353131 Nonexudative age-related macular degeneration, bilateral, early dry stage: Secondary | ICD-10-CM | POA: Diagnosis not present

## 2017-03-22 DIAGNOSIS — H40033 Anatomical narrow angle, bilateral: Secondary | ICD-10-CM | POA: Diagnosis not present

## 2017-03-22 DIAGNOSIS — H2513 Age-related nuclear cataract, bilateral: Secondary | ICD-10-CM | POA: Diagnosis not present

## 2017-03-28 DIAGNOSIS — H2513 Age-related nuclear cataract, bilateral: Secondary | ICD-10-CM | POA: Diagnosis not present

## 2017-04-10 ENCOUNTER — Ambulatory Visit (INDEPENDENT_AMBULATORY_CARE_PROVIDER_SITE_OTHER): Payer: Medicare Other | Admitting: Emergency Medicine

## 2017-04-10 ENCOUNTER — Encounter: Payer: Self-pay | Admitting: Emergency Medicine

## 2017-04-10 DIAGNOSIS — J410 Simple chronic bronchitis: Secondary | ICD-10-CM

## 2017-04-10 DIAGNOSIS — R911 Solitary pulmonary nodule: Secondary | ICD-10-CM

## 2017-04-10 DIAGNOSIS — R918 Other nonspecific abnormal finding of lung field: Secondary | ICD-10-CM

## 2017-04-10 LAB — PULMONARY FUNCTION TEST
DL/VA % pred: 88 %
DL/VA: 3.92 ml/min/mmHg/L
DLCO unc % pred: 68 %
DLCO unc: 20.25 ml/min/mmHg
FEF 25-75 Post: 0.87 L/sec
FEF 25-75 Pre: 0.96 L/sec
FEF2575-%Change-Post: -9 %
FEF2575-%Pred-Post: 54 %
FEF2575-%Pred-Pre: 60 %
FEV1-%Change-Post: -2 %
FEV1-%Pred-Post: 67 %
FEV1-%Pred-Pre: 69 %
FEV1-Post: 1.66 L
FEV1-Pre: 1.7 L
FEV1FVC-%Change-Post: -4 %
FEV1FVC-%Pred-Pre: 90 %
FEV6-%Change-Post: 3 %
FEV6-%Pred-Post: 80 %
FEV6-%Pred-Pre: 78 %
FEV6-Post: 2.64 L
FEV6-Pre: 2.55 L
FEV6FVC-%Change-Post: 0 %
FEV6FVC-%Pred-Post: 105 %
FEV6FVC-%Pred-Pre: 105 %
FVC-%Change-Post: 2 %
FVC-%Pred-Post: 76 %
FVC-%Pred-Pre: 74 %
FVC-Post: 2.71 L
FVC-Pre: 2.63 L
Post FEV1/FVC ratio: 61 %
Post FEV6/FVC ratio: 97 %
Pre FEV1/FVC ratio: 64 %
Pre FEV6/FVC Ratio: 97 %
RV % pred: 120 %
RV: 3.17 L
TLC % pred: 92 %
TLC: 6.15 L

## 2017-04-10 MED ORDER — UMECLIDINIUM-VILANTEROL 62.5-25 MCG/INH IN AEPB: 1.0000 | INHALATION_SPRAY | Freq: Every day | RESPIRATORY_TRACT | 5 refills | Status: DC

## 2017-04-10 NOTE — Assessment & Plan Note (Signed)
Please continue Anoro once daily as you have been taking it. Keep Pro Air available to use 2 puffs up to every 4 hours as needed for chest tightness, wheezing, shortness of breath. Agree with slowly and carefully increasing your exercise and cardiopulmonary conditioning. Try to work on decreasing your carbohydrate intake.

## 2017-04-10 NOTE — Assessment & Plan Note (Signed)
We will plan to repeat your CT scan of the chest in July 2019 to follow your small pulmonary nodule. Follow with Dr Lamonte Sakai in July after the Ct scan to review the results together.

## 2017-04-10 NOTE — Addendum Note (Signed)
Addended by: Desmond Dike C on: 04/10/2017 10:45 AM   Modules accepted: Orders

## 2017-04-10 NOTE — Patient Instructions (Addendum)
Please continue Anoro once daily as you have been taking it. Keep Pro Air available to use 2 puffs up to every 4 hours as needed for chest tightness, wheezing, shortness of breath. Agree with slowly and carefully increasing your exercise and cardiopulmonary conditioning. Try to work on decreasing your carbohydrate intake.  We will plan to repeat your CT scan of the chest in July 2019 to follow your small pulmonary nodule. Follow with Dr Lamonte Sakai in July after the Ct scan to review the results together.

## 2017-04-10 NOTE — Progress Notes (Signed)
PFT completed 04/10/17  

## 2017-04-10 NOTE — Progress Notes (Signed)
Subjective:    Patient ID: Ernest Aus., male    DOB: Jun 19, 1931, 84 y.o.   MRN: [id number]  HPI 84 year old former smoker with a history of coronary artery disease, hypertension, hyperlipidemia, diabetes type 2, hypothyroidism, peripheral vascular disease.  He has been seen in our office before by Dr. Melvyn Novas, carries a diagnosis of COPD.  Pulmonary function testing from 01/2011 was reviewed.  This showed moderately severe obstruction with a positive bronchodilator response, borderline hyperinflation and normal diffusion capacity.   He is referred back today for evaluation of his COPD.  He is currently managed on [facility], he believes that. He has exertional SOB that is progressively worse. He is worse during heavy allergy season - has wheeze. Occasional cough, white phlegm. Having more sinus drainage for the last few weeks, impacting his breathing. He is able to walk about 1/2 a block. Able to walk through a store.  No pred or abx. He uses zyrtec prn. n                                                                                                                              He had a CT scan of his chest 07/04/16 that I reviewed.  This was done after a mechanical fall.  There were slightly displaced right fifth and sixth rib fractures without pneumothorax, and a 4 mm right apical nodule.   ROV 04/10/17 -- follow up today for COPD.  Also with a history of a 4 mm right apical pulmonary nodule noted on CT scan of the chest 07/04/16.  At his last visit we tried a change from [facility] to [facility] to see if he would benefit.  He had an ambulatory oximetry that did not show any evidence of desaturation at that time. He has noticed an improvement in his wheeze, less proair use since he started the Anoro. Better exercise tolerance, doing home chores. He has gained a couple pounds (weighed with a heavy jacket on today), admits to dietary indiscretion.    Review of Systems  Constitutional: Negative for fever and  unexpected weight change.  HENT: Negative for congestion, dental problem, ear pain, nosebleeds, postnasal drip, rhinorrhea, sinus pressure, sneezing, sore throat and trouble swallowing.   Eyes: Negative for redness and itching.  Respiratory: Negative for cough, chest tightness, shortness of breath and wheezing.   Cardiovascular: Negative for palpitations and leg swelling.  Gastrointestinal: Negative for nausea and vomiting.  Genitourinary: Negative for dysuria.  Musculoskeletal: Negative for joint swelling.  Skin: Negative for rash.  Neurological: Negative for headaches.  Hematological: Does not bruise/bleed easily.  Psychiatric/Behavioral: Negative for dysphoric mood. The patient is not nervous/anxious.     Past Medical History:  Diagnosis Date  . COPD (chronic obstructive pulmonary disease) ([place])   . Coronary heart disease    remote PCI  . Diabetes mellitus   . DVT (deep venous thrombosis) ([place]) 2011  . Gout   . Hyperlipidemia   .  Hypertension   . Peripheral arterial disease (HCC)      Family History  Problem Relation Age of Onset  . Diabetes Father   . Diabetes Mother   . Diabetes Brother   . Diabetes Brother      Social History   Socioeconomic History  . Marital status: Divorced    Spouse name: Not on file  . Number of children: 2  . Years of education: Not on file  . Highest education level: Not on file  Occupational History  . Occupation: retired from [employer]  . Financial resource strain: Not on file  . Food insecurity:    Worry: Not on file    Inability: Not on file  . Transportation needs:    Medical: Not on file    Non-medical: Not on file  Tobacco Use  . Smoking status: Former Smoker    Packs/day: 2.00    Years: 60.00    Pack years: 120.00    Types: Cigarettes    Last attempt to quit: 01/02/2001    Years since quitting: 16.2  . Smokeless tobacco: Former Systems developer    Types: Chew  Substance and Sexual Activity  . Alcohol use: No  . Drug  use: No  . Sexual activity: Not on file  Lifestyle  . Physical activity:    Days per week: Not on file    Minutes per session: Not on file  . Stress: Not on file  Relationships  . Social connections:    Talks on phone: Not on file    Gets together: Not on file    Attends religious service: Not on file    Active member of club or organization: Not on file    Attends meetings of clubs or organizations: Not on file    Relationship status: Not on file  . Intimate partner violence:    Fear of current or ex partner: Not on file    Emotionally abused: Not on file    Physically abused: Not on file    Forced sexual activity: Not on file  Other Topics Concern  . Not on file  Social History Narrative  . Not on file     Allergies  Allergen Reactions  . Actos [Pioglitazone Hydrochloride] Other (See Comments)    RIGHT LEG SWELLING   . Metformin And Related Other (See Comments)     Outpatient Medications Prior to Visit  Medication Sig Dispense Refill  . acetaminophen (TYLENOL) 500 MG tablet Take 1,000 mg by mouth every 6 (six) hours as needed.    Marland Kitchen albuterol (PROAIR HFA) 108 (90 BASE) MCG/ACT inhaler Inhale 2 puffs into the lungs every 6 (six) hours as needed for wheezing or shortness of breath.     . allopurinol (ZYLOPRIM) 100 MG tablet TAKE 1 TABLET (100MG ) DAILY  12  . aspirin 81 MG tablet Take 81 mg by mouth daily.     . cetirizine (ZYRTEC) 10 MG tablet Take 10 mg by mouth daily as needed for allergies.    Marland Kitchen docusate sodium (STOOL SOFTENER) 100 MG capsule Take 100 mg by mouth 2 (two) times daily.    . DULoxetine (CYMBALTA) 30 MG capsule Take 30 mg by mouth daily.    . Ferrous Sulfate (IRON) 325 (65 Fe) MG TABS Take 325 mg by mouth daily.    . furosemide (LASIX) 40 MG tablet TAKE 1 TABLET (40MG ) BY MOUTH DAILY  0  . Insulin Degludec (TRESIBA FLEXTOUCH [place]) Inject 26 Units into the  skin every morning.    . insulin lispro (HUMALOG) 100 UNIT/ML injection Inject 8 Units into the skin  daily.    Marland Kitchen levothyroxine (SYNTHROID, LEVOTHROID) 25 MCG tablet Take 25 mcg by mouth daily before breakfast.    . Melatonin 10 MG TABS Take 10 mg by mouth at bedtime.    . Mometasone Furo-Formoterol Fum (DULERA) 200-5 MCG/ACT AERO Inhale 2 puffs into the lungs 2 (two) times daily. 1 Inhaler 11  . omeprazole (PRILOSEC) 20 MG capsule Take 20 mg by mouth 2 (two) times daily.    . polyethylene glycol (MIRALAX / GLYCOLAX) packet Take 17 g by mouth daily as needed for moderate constipation. 14 each 0  . rosuvastatin (CRESTOR) 40 MG tablet Take 40 mg by mouth daily.      . sitaGLIPtin (JANUVIA) 100 MG tablet Take 100 mg by mouth daily.    Marland Kitchen umeclidinium-vilanterol (ANORO ELLIPTA) 62.5-25 MCG/INH AEPB Inhale 1 puff into the lungs daily. 1 each 0  . olmesartan (BENICAR) 40 MG tablet One half daily (Patient not taking: Reported on 04/10/2017) 15 tablet 5  . guaiFENesin-dextromethorphan (ROBITUSSIN DM) 100-10 MG/5ML syrup Take 5 mLs by mouth every 4 (four) hours as needed for cough. 118 mL 0   No facility-administered medications prior to visit.         Objective:   Physical Exam Vitals:   04/10/17 1009 04/10/17 1010  BP:  130/82  Pulse:  97  SpO2:  93%  Weight: 245 lb 12.8 oz (111.5 kg)   Height: 5\' 8"  (1.727 m)    Gen: Pleasant, overweight elderly gentleman, in no distress,  normal affect  ENT: No lesions,  mouth clear,  oropharynx clear, no postnasal drip, hearing better today w his aids in place  Neck: No JVD, mild inspiratory and expiratory stridor  Lungs: No use of accessory muscles, bilateral exp wheezes more at the bases  Cardiovascular: RRR, heart sounds normal, no murmur or gallops, no peripheral edema  Musculoskeletal: No deformities, no cyanosis or clubbing  Neuro: alert, non focal  Skin: Warm, no lesions or rash    Assessment & Plan:  COPD (chronic obstructive pulmonary disease) (HCC) Please continue Anoro once daily as you have been taking it. Keep Pro Air available to  use 2 puffs up to every 4 hours as needed for chest tightness, wheezing, shortness of breath. Agree with slowly and carefully increasing your exercise and cardiopulmonary conditioning. Try to work on decreasing your carbohydrate intake.   Solitary pulmonary nodule We will plan to repeat your CT scan of the chest in July 2019 to follow your small pulmonary nodule. Follow with Dr Lamonte Sakai in July after the Ct scan to review the results together.   Baltazar Apo, MD, PhD 04/10/2017, 10:42 AM [facility] [phone] or if no answer [phone]

## 2017-04-16 DIAGNOSIS — H2511 Age-related nuclear cataract, right eye: Secondary | ICD-10-CM | POA: Diagnosis not present

## 2017-04-19 DIAGNOSIS — E063 Autoimmune thyroiditis: Secondary | ICD-10-CM | POA: Diagnosis not present

## 2017-04-19 DIAGNOSIS — R609 Edema, unspecified: Secondary | ICD-10-CM | POA: Diagnosis not present

## 2017-04-19 DIAGNOSIS — Z794 Long term (current) use of insulin: Secondary | ICD-10-CM | POA: Diagnosis not present

## 2017-04-19 DIAGNOSIS — E1142 Type 2 diabetes mellitus with diabetic polyneuropathy: Secondary | ICD-10-CM | POA: Diagnosis not present

## 2017-04-19 DIAGNOSIS — E039 Hypothyroidism, unspecified: Secondary | ICD-10-CM | POA: Diagnosis not present

## 2017-04-19 DIAGNOSIS — I739 Peripheral vascular disease, unspecified: Secondary | ICD-10-CM | POA: Diagnosis not present

## 2017-05-09 DIAGNOSIS — Z794 Long term (current) use of insulin: Secondary | ICD-10-CM | POA: Diagnosis not present

## 2017-05-09 DIAGNOSIS — I10 Essential (primary) hypertension: Secondary | ICD-10-CM | POA: Diagnosis not present

## 2017-05-09 DIAGNOSIS — J449 Chronic obstructive pulmonary disease, unspecified: Secondary | ICD-10-CM | POA: Diagnosis not present

## 2017-05-09 DIAGNOSIS — E1140 Type 2 diabetes mellitus with diabetic neuropathy, unspecified: Secondary | ICD-10-CM | POA: Diagnosis not present

## 2017-05-09 DIAGNOSIS — E039 Hypothyroidism, unspecified: Secondary | ICD-10-CM | POA: Diagnosis not present

## 2017-05-09 DIAGNOSIS — E669 Obesity, unspecified: Secondary | ICD-10-CM | POA: Diagnosis not present

## 2017-05-09 DIAGNOSIS — I739 Peripheral vascular disease, unspecified: Secondary | ICD-10-CM | POA: Diagnosis not present

## 2017-05-09 DIAGNOSIS — Z6838 Body mass index (BMI) 38.0-38.9, adult: Secondary | ICD-10-CM | POA: Diagnosis not present

## 2017-05-09 DIAGNOSIS — R972 Elevated prostate specific antigen [PSA]: Secondary | ICD-10-CM | POA: Diagnosis not present

## 2017-05-14 DIAGNOSIS — H2512 Age-related nuclear cataract, left eye: Secondary | ICD-10-CM | POA: Diagnosis not present

## 2017-05-21 DIAGNOSIS — H5703 Miosis: Secondary | ICD-10-CM | POA: Diagnosis not present

## 2017-05-21 DIAGNOSIS — H2512 Age-related nuclear cataract, left eye: Secondary | ICD-10-CM | POA: Diagnosis not present

## 2017-05-21 DIAGNOSIS — H2181 Floppy iris syndrome: Secondary | ICD-10-CM | POA: Diagnosis not present

## 2017-05-22 DIAGNOSIS — H26491 Other secondary cataract, right eye: Secondary | ICD-10-CM | POA: Diagnosis not present

## 2017-05-29 ENCOUNTER — Encounter: Payer: Self-pay | Admitting: Podiatry

## 2017-05-29 ENCOUNTER — Ambulatory Visit (INDEPENDENT_AMBULATORY_CARE_PROVIDER_SITE_OTHER): Payer: Medicare Other | Admitting: Podiatry

## 2017-05-29 DIAGNOSIS — M79674 Pain in right toe(s): Secondary | ICD-10-CM

## 2017-05-29 DIAGNOSIS — M79675 Pain in left toe(s): Secondary | ICD-10-CM

## 2017-05-29 DIAGNOSIS — E119 Type 2 diabetes mellitus without complications: Secondary | ICD-10-CM

## 2017-05-29 DIAGNOSIS — Z794 Long term (current) use of insulin: Secondary | ICD-10-CM

## 2017-05-29 DIAGNOSIS — E1142 Type 2 diabetes mellitus with diabetic polyneuropathy: Secondary | ICD-10-CM

## 2017-05-29 DIAGNOSIS — B351 Tinea unguium: Secondary | ICD-10-CM | POA: Diagnosis not present

## 2017-05-29 DIAGNOSIS — I739 Peripheral vascular disease, unspecified: Secondary | ICD-10-CM

## 2017-05-29 NOTE — Progress Notes (Signed)
Complaint:  Visit Type: Patient returns to my office for continued preventative foot care services. Complaint: Patient states" my nails have grown long and thick and become painful to walk and wear shoes" Patient has been diagnosed with DM with pvd.. The patient presents for preventative foot care services. No changes to ROS  Podiatric Exam: Vascular: dorsalis pedis and posterior tibial pulses are not  palpable bilateral. Capillary return is immediate. Temperature gradient is WNL. Skin turgor WNL Venous stasis feet and legs. Sensorium: Diminished  Semmes Weinstein monofilament test. Normal tactile sensation bilaterally. Nail Exam: Pt has thick disfigured discolored nails with subungual debris noted bilateral entire nail hallux through fifth toenails Ulcer Exam: There is no evidence of ulcer or pre-ulcerative changes or infection. Orthopedic Exam: Muscle tone and strength are WNL. No limitations in general ROM. No crepitus or effusions noted. HAV with hammer toe right foot. Skin: No Porokeratosis. No infection or ulcers  Diagnosis:  Onychomycosis, , Pain in right toe, pain in left toes  Treatment & Plan Procedures and Treatment: Consent by patient was obtained for treatment procedures.   Debridement of mycotic and hypertrophic toenails, 1 through 5 bilateral and clearing of subungual debris. No ulceration, no infection noted.  ABN signed for 2019. Return Visit-Office Procedure: Patient instructed to return to the office for a follow up visit 3 months for continued evaluation and treatment.    Fannie Alomar DPM 

## 2017-06-26 ENCOUNTER — Other Ambulatory Visit: Payer: Self-pay | Admitting: *Deleted

## 2017-06-26 MED ORDER — UMECLIDINIUM-VILANTEROL 62.5-25 MCG/INH IN AEPB: 1.0000 | INHALATION_SPRAY | Freq: Every day | RESPIRATORY_TRACT | 1 refills | Status: DC

## 2017-07-03 ENCOUNTER — Ambulatory Visit (INDEPENDENT_AMBULATORY_CARE_PROVIDER_SITE_OTHER)
Admission: RE | Admit: 2017-07-03 | Discharge: 2017-07-03 | Disposition: A | Discharge status: Home / Self Care | Payer: Medicare Other | Source: Ambulatory Visit | Attending: Emergency Medicine | Admitting: Emergency Medicine

## 2017-07-03 DIAGNOSIS — R918 Other nonspecific abnormal finding of lung field: Secondary | ICD-10-CM | POA: Diagnosis not present

## 2017-07-10 ENCOUNTER — Telehealth: Payer: Self-pay | Admitting: Emergency Medicine

## 2017-07-10 NOTE — Telephone Encounter (Signed)
Called and spoke with pt's son Jahlon who is requesting to know the results of pt's recent CT.  Dr. Lamonte Sakai, please advise on this for pt. Thanks!

## 2017-07-11 NOTE — Telephone Encounter (Signed)
Please let him know that his CT scan shows that his pulmonary nodule is unchanged in size or appearance. This is good news. He should not need any further scans to follow this.

## 2017-07-12 NOTE — Telephone Encounter (Signed)
Spoke with pt's son, Christop. He is aware of results. Nothing further was needed.

## 2017-08-14 ENCOUNTER — Encounter: Payer: Self-pay | Admitting: Emergency Medicine

## 2017-08-14 ENCOUNTER — Ambulatory Visit (INDEPENDENT_AMBULATORY_CARE_PROVIDER_SITE_OTHER): Payer: Medicare Other | Admitting: Emergency Medicine

## 2017-08-14 DIAGNOSIS — J410 Simple chronic bronchitis: Secondary | ICD-10-CM

## 2017-08-14 DIAGNOSIS — R911 Solitary pulmonary nodule: Secondary | ICD-10-CM | POA: Diagnosis not present

## 2017-08-14 NOTE — Progress Notes (Signed)
Subjective:    Patient ID: Ernest Clark., male    DOB: 12-30-1931, 84 y.o.   MRN: [id number]  HPI 84 year old former smoker with a history of coronary artery disease, hypertension, hyperlipidemia, diabetes type 2, hypothyroidism, peripheral vascular disease.  He has been seen in our office before by Dr. Melvyn Novas, carries a diagnosis of COPD.  Pulmonary function testing from 01/2011 was reviewed.  This showed moderately severe obstruction with a positive bronchodilator response, borderline hyperinflation and normal diffusion capacity.   He is referred back today for evaluation of his COPD.  He is currently managed on [facility], he believes that. He has exertional SOB that is progressively worse. He is worse during heavy allergy season - has wheeze. Occasional cough, white phlegm. Having more sinus drainage for the last few weeks, impacting his breathing. He is able to walk about 1/2 a block. Able to walk through a store.  No pred or abx. He uses zyrtec prn. n                                                                                                                              He had a CT scan of his chest 07/04/16 that I reviewed.  This was done after a mechanical fall.  There were slightly displaced right fifth and sixth rib fractures without pneumothorax, and a 4 mm right apical nodule.   ROV 04/10/17 -- follow up today for COPD.  Also with a history of a 4 mm right apical pulmonary nodule noted on CT scan of the chest 07/04/16.  At his last visit we tried a change from [facility] to [facility] to see if he would benefit.  He had an ambulatory oximetry that did not show any evidence of desaturation at that time. He has noticed an improvement in his wheeze, less proair use since he started the Anoro. Better exercise tolerance, doing home chores. He has gained a couple pounds (weighed with a heavy jacket on today), admits to dietary indiscretion.   ROV 08/14/17 --84 year old man with a history of tobacco,  moderately severe obstructive disease by spirometry, COPD.  He has also been followed for a 4 mm right apical nodule that was discovered on CT scan after a trauma.  We repeated his CT on 07/03/2017 and there has been no interval change.  He should not need a repeat scan given its size.  He is currently managed on . He reports cough and scratchy throat. He is on zyrtec, uses OTC nasal spray prn. He has exertional SOB when he coughs and has mucous. He can have cough with some consistencies of liquids like milkshakes. Overall activity level is low. He has some intermittent wheeze, uses albuterol a few times a week. He believes that he has had the PNA shots with Dr Rex Kras.   Review of Systems  Constitutional: Negative for fever and unexpected weight change.  HENT: Negative for congestion, dental problem, ear pain, nosebleeds,  postnasal drip, rhinorrhea, sinus pressure, sneezing, sore throat and trouble swallowing.   Eyes: Negative for redness and itching.  Respiratory: Negative for cough, chest tightness, shortness of breath and wheezing.   Cardiovascular: Negative for palpitations and leg swelling.  Gastrointestinal: Negative for nausea and vomiting.  Genitourinary: Negative for dysuria.  Musculoskeletal: Negative for joint swelling.  Skin: Negative for rash.  Neurological: Negative for headaches.  Hematological: Does not bruise/bleed easily.  Psychiatric/Behavioral: Negative for dysphoric mood. The patient is not nervous/anxious.     Past Medical History:  Diagnosis Date  . COPD (chronic obstructive pulmonary disease) ([place])   . Coronary heart disease    remote PCI  . Diabetes mellitus   . DVT (deep venous thrombosis) ([place]) 2011  . Gout   . Hyperlipidemia   . Hypertension   . Peripheral arterial disease (HCC)      Family History  Problem Relation Age of Onset  . Diabetes Father   . Diabetes Mother   . Diabetes Brother   . Diabetes Brother      Social History   Socioeconomic History   . Marital status: Divorced    Spouse name: Not on file  . Number of children: 2  . Years of education: Not on file  . Highest education level: Not on file  Occupational History  . Occupation: retired from [employer]  . Financial resource strain: Not on file  . Food insecurity:    Worry: Not on file    Inability: Not on file  . Transportation needs:    Medical: Not on file    Non-medical: Not on file  Tobacco Use  . Smoking status: Former Smoker    Packs/day: 2.00    Years: 60.00    Pack years: 120.00    Types: Cigarettes    Last attempt to quit: 01/02/2001    Years since quitting: 16.6  . Smokeless tobacco: Former Systems developer    Types: Chew  Substance and Sexual Activity  . Alcohol use: No  . Drug use: No  . Sexual activity: Not on file  Lifestyle  . Physical activity:    Days per week: Not on file    Minutes per session: Not on file  . Stress: Not on file  Relationships  . Social connections:    Talks on phone: Not on file    Gets together: Not on file    Attends religious service: Not on file    Active member of club or organization: Not on file    Attends meetings of clubs or organizations: Not on file    Relationship status: Not on file  . Intimate partner violence:    Fear of current or ex partner: Not on file    Emotionally abused: Not on file    Physically abused: Not on file    Forced sexual activity: Not on file  Other Topics Concern  . Not on file  Social History Narrative  . Not on file     Allergies  Allergen Reactions  . Actos [Pioglitazone Hydrochloride] Other (See Comments)    RIGHT LEG SWELLING   . Metformin And Related Other (See Comments)     Outpatient Medications Prior to Visit  Medication Sig Dispense Refill  . acetaminophen (TYLENOL) 500 MG tablet Take 1,000 mg by mouth every 6 (six) hours as needed.    Marland Kitchen albuterol (PROAIR HFA) 108 (90 BASE) MCG/ACT inhaler Inhale 2 puffs into the lungs every 6 (six) hours as needed for  wheezing or shortness of breath.     . allopurinol (ZYLOPRIM) 100 MG tablet TAKE 1 TABLET (100MG ) DAILY  12  . aspirin 81 MG tablet Take 81 mg by mouth daily.     . cetirizine (ZYRTEC) 10 MG tablet Take 10 mg by mouth daily as needed for allergies.    Marland Kitchen docusate sodium (STOOL SOFTENER) 100 MG capsule Take 100 mg by mouth 2 (two) times daily.    . DULoxetine (CYMBALTA) 30 MG capsule Take 30 mg by mouth daily.    . Ferrous Sulfate (IRON) 325 (65 Fe) MG TABS Take 325 mg by mouth daily.    . furosemide (LASIX) 40 MG tablet TAKE 1 TABLET (40MG ) BY MOUTH DAILY  0  . Insulin Degludec (TRESIBA FLEXTOUCH [place]) Inject 26 Units into the skin every morning.    . insulin lispro (HUMALOG) 100 UNIT/ML injection Inject 8 Units into the skin daily.    Marland Kitchen levothyroxine (SYNTHROID, LEVOTHROID) 25 MCG tablet Take 25 mcg by mouth daily before breakfast.    . Melatonin 10 MG TABS Take 10 mg by mouth at bedtime.    Marland Kitchen olmesartan (BENICAR) 40 MG tablet One half daily 15 tablet 5  . omeprazole (PRILOSEC) 20 MG capsule Take 20 mg by mouth 2 (two) times daily.    . polyethylene glycol (MIRALAX / GLYCOLAX) packet Take 17 g by mouth daily as needed for moderate constipation. 14 each 0  . rosuvastatin (CRESTOR) 40 MG tablet Take 40 mg by mouth daily.      . sitaGLIPtin (JANUVIA) 100 MG tablet Take 100 mg by mouth daily.    Marland Kitchen umeclidinium-vilanterol (ANORO ELLIPTA) 62.5-25 MCG/INH AEPB Inhale 1 puff into the lungs daily. 180 each 1  . Mometasone Furo-Formoterol Fum (DULERA) 200-5 MCG/ACT AERO Inhale 2 puffs into the lungs 2 (two) times daily. 1 Inhaler 11   No facility-administered medications prior to visit.         Objective:   Physical Exam Vitals:   08/14/17 1013  BP: 128/68  Pulse: 99  SpO2: 93%  Weight: 256 lb (116.1 kg)  Height: 5\' 8"  (1.727 m)   Gen: Pleasant, overweight elderly gentleman, in no distress,  normal affect  ENT: No lesions,  mouth clear,  oropharynx clear, no postnasal drip, hearing better  today w his aids in place  Neck: No JVD, no stridor, strong voice.   Lungs: No use of accessory muscles, bilateral exp wheeze   Cardiovascular: RRR, heart sounds normal, no murmur or gallops, no peripheral edema  Musculoskeletal: No deformities, no cyanosis or clubbing  Neuro: alert, non focal  Skin: Warm, no lesions or rash    Assessment & Plan:  COPD (chronic obstructive pulmonary disease) ([place]) He has stable wheeze on exam today.  Question whether he may benefit from Mucinex or other therapy, fluticasone nasal spray given his correlation between his mucus burden and his overall breathing.  Please continue Anoro as you have been taking it. Keep albuterol available to use 2 puffs if needed for shortness of breath, cough, wheezing, chest tightness. Continue Zyrtec every day. Depending on how much cough or mucus production you are experiencing we may decide to add an every day nasal spray at some point in the future. We will confirm with Dr. Eddie Dibbles office that you have had your pneumonia immunizations. Remember to get the flu shot this fall. Follow with Dr Lamonte Sakai in 4 months or sooner if you have any problems.  Solitary pulmonary nodule 4 mm nodule stable on  scan after 1 year.  Given its size and stability he should not need a repeat scan.  Baltazar Apo, MD, PhD 08/14/2017, 10:35 AM [facility] [phone] or if no answer [phone]

## 2017-08-14 NOTE — Assessment & Plan Note (Signed)
4 mm nodule stable on scan after 1 year.  Given its size and stability he should not need a repeat scan.

## 2017-08-14 NOTE — Assessment & Plan Note (Signed)
He has stable wheeze on exam today.  Question whether he may benefit from Mucinex or other therapy, fluticasone nasal spray given his correlation between his mucus burden and his overall breathing.  Please continue Anoro as you have been taking it. Keep albuterol available to use 2 puffs if needed for shortness of breath, cough, wheezing, chest tightness. Continue Zyrtec every day. Depending on how much cough or mucus production you are experiencing we may decide to add an every day nasal spray at some point in the future. We will confirm with Dr. Eddie Dibbles office that you have had your pneumonia immunizations. Remember to get the flu shot this fall. Follow with Dr Lamonte Sakai in 4 months or sooner if you have any problems.

## 2017-08-14 NOTE — Patient Instructions (Signed)
Please continue Anoro as you have been taking it. Keep albuterol available to use 2 puffs if needed for shortness of breath, cough, wheezing, chest tightness. Continue Zyrtec every day. Depending on how much cough or mucus production you are experiencing we may decide to add an every day nasal spray at some point in the future. We will confirm with Dr. Eddie Dibbles office that you have had your pneumonia immunizations. Remember to get the flu shot this fall. Your CT scan of the chest showed that your small pulmonary nodule is stable in size.  You should not need any follow-up scans for this. Follow with Dr Lamonte Sakai in 4 months or sooner if you have any problems.

## 2017-08-28 ENCOUNTER — Ambulatory Visit (INDEPENDENT_AMBULATORY_CARE_PROVIDER_SITE_OTHER): Payer: Medicare Other | Admitting: Podiatry

## 2017-08-28 ENCOUNTER — Encounter: Payer: Self-pay | Admitting: Podiatry

## 2017-08-28 DIAGNOSIS — M79674 Pain in right toe(s): Secondary | ICD-10-CM

## 2017-08-28 DIAGNOSIS — B351 Tinea unguium: Secondary | ICD-10-CM

## 2017-08-28 DIAGNOSIS — E1142 Type 2 diabetes mellitus with diabetic polyneuropathy: Secondary | ICD-10-CM

## 2017-08-28 DIAGNOSIS — M79675 Pain in left toe(s): Secondary | ICD-10-CM

## 2017-08-28 DIAGNOSIS — I739 Peripheral vascular disease, unspecified: Secondary | ICD-10-CM

## 2017-08-28 NOTE — Progress Notes (Signed)
Complaint:  Visit Type: Patient returns to my office for continued preventative foot care services. Complaint: Patient states" my nails have grown long and thick and become painful to walk and wear shoes" Patient has been diagnosed with DM with pvd.. The patient presents for preventative foot care services. No changes to ROS  Podiatric Exam: Vascular: dorsalis pedis and posterior tibial pulses are not  palpable bilateral. Capillary return is immediate. Temperature gradient is WNL. Skin turgor WNL Venous stasis feet and legs. Sensorium: Diminished  Semmes Weinstein monofilament test. Normal tactile sensation bilaterally. Nail Exam: Pt has thick disfigured discolored nails with subungual debris noted bilateral entire nail hallux through fifth toenails Ulcer Exam: There is no evidence of ulcer or pre-ulcerative changes or infection. Orthopedic Exam: Muscle tone and strength are WNL. No limitations in general ROM. No crepitus or effusions noted. HAV with hammer toe right foot. Skin: No Porokeratosis. No infection or ulcers  Diagnosis:  Onychomycosis, , Pain in right toe, pain in left toes  Treatment & Plan Procedures and Treatment: Consent by patient was obtained for treatment procedures.   Debridement of mycotic and hypertrophic toenails, 1 through 5 bilateral and clearing of subungual debris. No ulceration, no infection noted.  ABN signed for 2019. Return Visit-Office Procedure: Patient instructed to return to the office for a follow up visit 3 months for continued evaluation and treatment.    Geronimo Diliberto DPM 

## 2017-10-23 DIAGNOSIS — E1142 Type 2 diabetes mellitus with diabetic polyneuropathy: Secondary | ICD-10-CM | POA: Diagnosis not present

## 2017-10-23 DIAGNOSIS — Z23 Encounter for immunization: Secondary | ICD-10-CM | POA: Diagnosis not present

## 2017-10-23 DIAGNOSIS — E063 Autoimmune thyroiditis: Secondary | ICD-10-CM | POA: Diagnosis not present

## 2017-10-23 DIAGNOSIS — I739 Peripheral vascular disease, unspecified: Secondary | ICD-10-CM | POA: Diagnosis not present

## 2017-10-23 DIAGNOSIS — M2550 Pain in unspecified joint: Secondary | ICD-10-CM | POA: Diagnosis not present

## 2017-10-23 DIAGNOSIS — E039 Hypothyroidism, unspecified: Secondary | ICD-10-CM | POA: Diagnosis not present

## 2017-10-23 DIAGNOSIS — Z794 Long term (current) use of insulin: Secondary | ICD-10-CM | POA: Diagnosis not present

## 2017-11-27 ENCOUNTER — Ambulatory Visit (INDEPENDENT_AMBULATORY_CARE_PROVIDER_SITE_OTHER): Payer: Medicare Other | Admitting: Podiatry

## 2017-11-27 ENCOUNTER — Encounter: Payer: Self-pay | Admitting: Podiatry

## 2017-11-27 DIAGNOSIS — B351 Tinea unguium: Secondary | ICD-10-CM | POA: Diagnosis not present

## 2017-11-27 DIAGNOSIS — M79674 Pain in right toe(s): Secondary | ICD-10-CM | POA: Diagnosis not present

## 2017-11-27 DIAGNOSIS — E1142 Type 2 diabetes mellitus with diabetic polyneuropathy: Secondary | ICD-10-CM

## 2017-11-27 DIAGNOSIS — M79675 Pain in left toe(s): Secondary | ICD-10-CM | POA: Diagnosis not present

## 2017-11-27 DIAGNOSIS — I739 Peripheral vascular disease, unspecified: Secondary | ICD-10-CM

## 2017-11-27 NOTE — Progress Notes (Signed)
Complaint:  Visit Type: Patient returns to my office for continued preventative foot care services. Complaint: Patient states" my nails have grown long and thick and become painful to walk and wear shoes" Patient has been diagnosed with DM with pvd.. The patient presents for preventative foot care services. No changes to ROS  Podiatric Exam: Vascular: dorsalis pedis and posterior tibial pulses are not  palpable bilateral. Capillary return is immediate. Temperature gradient is WNL. Skin turgor WNL Venous stasis feet and legs. Sensorium: Diminished  Semmes Weinstein monofilament test. Normal tactile sensation bilaterally. Nail Exam: Pt has thick disfigured discolored nails with subungual debris noted bilateral entire nail hallux through fifth toenails Ulcer Exam: There is no evidence of ulcer or pre-ulcerative changes or infection. Orthopedic Exam: Muscle tone and strength are WNL. No limitations in general ROM. No crepitus or effusions noted. HAV with hammer toe right foot. Skin: No Porokeratosis. No infection or ulcers  Diagnosis:  Onychomycosis, , Pain in right toe, pain in left toes  Treatment & Plan Procedures and Treatment: Consent by patient was obtained for treatment procedures.   Debridement of mycotic and hypertrophic toenails, 1 through 5 bilateral and clearing of subungual debris. No ulceration, no infection noted.  ABN signed for 2019. Return Visit-Office Procedure: Patient instructed to return to the office for a follow up visit 3 months for continued evaluation and treatment.    Gardiner Barefoot DPM

## 2018-01-23 ENCOUNTER — Other Ambulatory Visit: Payer: Self-pay | Admitting: Emergency Medicine

## 2018-01-28 ENCOUNTER — Other Ambulatory Visit: Payer: Self-pay | Admitting: Emergency Medicine

## 2018-02-05 DIAGNOSIS — M25561 Pain in right knee: Secondary | ICD-10-CM | POA: Diagnosis not present

## 2018-02-05 DIAGNOSIS — S50312A Abrasion of left elbow, initial encounter: Secondary | ICD-10-CM | POA: Diagnosis not present

## 2018-02-06 DIAGNOSIS — M25561 Pain in right knee: Secondary | ICD-10-CM | POA: Diagnosis not present

## 2018-02-06 DIAGNOSIS — S50312A Abrasion of left elbow, initial encounter: Secondary | ICD-10-CM | POA: Diagnosis not present

## 2018-02-11 DIAGNOSIS — M25561 Pain in right knee: Secondary | ICD-10-CM | POA: Diagnosis not present

## 2018-02-11 DIAGNOSIS — M25562 Pain in left knee: Secondary | ICD-10-CM | POA: Diagnosis not present

## 2018-02-12 DIAGNOSIS — S50312D Abrasion of left elbow, subsequent encounter: Secondary | ICD-10-CM | POA: Diagnosis not present

## 2018-02-12 DIAGNOSIS — M170 Bilateral primary osteoarthritis of knee: Secondary | ICD-10-CM | POA: Diagnosis not present

## 2018-02-12 DIAGNOSIS — R5381 Other malaise: Secondary | ICD-10-CM | POA: Diagnosis not present

## 2018-02-18 DIAGNOSIS — Z87891 Personal history of nicotine dependence: Secondary | ICD-10-CM | POA: Diagnosis not present

## 2018-02-18 DIAGNOSIS — E785 Hyperlipidemia, unspecified: Secondary | ICD-10-CM | POA: Diagnosis not present

## 2018-02-18 DIAGNOSIS — I252 Old myocardial infarction: Secondary | ICD-10-CM | POA: Diagnosis not present

## 2018-02-18 DIAGNOSIS — Z86718 Personal history of other venous thrombosis and embolism: Secondary | ICD-10-CM | POA: Diagnosis not present

## 2018-02-18 DIAGNOSIS — I10 Essential (primary) hypertension: Secondary | ICD-10-CM | POA: Diagnosis not present

## 2018-02-18 DIAGNOSIS — I25119 Atherosclerotic heart disease of native coronary artery with unspecified angina pectoris: Secondary | ICD-10-CM | POA: Diagnosis not present

## 2018-02-18 DIAGNOSIS — E063 Autoimmune thyroiditis: Secondary | ICD-10-CM | POA: Diagnosis not present

## 2018-02-18 DIAGNOSIS — M109 Gout, unspecified: Secondary | ICD-10-CM | POA: Diagnosis not present

## 2018-02-18 DIAGNOSIS — E1151 Type 2 diabetes mellitus with diabetic peripheral angiopathy without gangrene: Secondary | ICD-10-CM | POA: Diagnosis not present

## 2018-02-18 DIAGNOSIS — S50312D Abrasion of left elbow, subsequent encounter: Secondary | ICD-10-CM | POA: Diagnosis not present

## 2018-02-18 DIAGNOSIS — Z9181 History of falling: Secondary | ICD-10-CM | POA: Diagnosis not present

## 2018-02-18 DIAGNOSIS — E1142 Type 2 diabetes mellitus with diabetic polyneuropathy: Secondary | ICD-10-CM | POA: Diagnosis not present

## 2018-02-18 DIAGNOSIS — Z6838 Body mass index (BMI) 38.0-38.9, adult: Secondary | ICD-10-CM | POA: Diagnosis not present

## 2018-02-18 DIAGNOSIS — I872 Venous insufficiency (chronic) (peripheral): Secondary | ICD-10-CM | POA: Diagnosis not present

## 2018-02-18 DIAGNOSIS — M170 Bilateral primary osteoarthritis of knee: Secondary | ICD-10-CM | POA: Diagnosis not present

## 2018-02-18 DIAGNOSIS — Z794 Long term (current) use of insulin: Secondary | ICD-10-CM | POA: Diagnosis not present

## 2018-02-18 DIAGNOSIS — K219 Gastro-esophageal reflux disease without esophagitis: Secondary | ICD-10-CM | POA: Diagnosis not present

## 2018-02-18 DIAGNOSIS — J449 Chronic obstructive pulmonary disease, unspecified: Secondary | ICD-10-CM | POA: Diagnosis not present

## 2018-02-20 DIAGNOSIS — I10 Essential (primary) hypertension: Secondary | ICD-10-CM | POA: Diagnosis not present

## 2018-02-20 DIAGNOSIS — E1151 Type 2 diabetes mellitus with diabetic peripheral angiopathy without gangrene: Secondary | ICD-10-CM | POA: Diagnosis not present

## 2018-02-20 DIAGNOSIS — I25119 Atherosclerotic heart disease of native coronary artery with unspecified angina pectoris: Secondary | ICD-10-CM | POA: Diagnosis not present

## 2018-02-20 DIAGNOSIS — S50312D Abrasion of left elbow, subsequent encounter: Secondary | ICD-10-CM | POA: Diagnosis not present

## 2018-02-20 DIAGNOSIS — I872 Venous insufficiency (chronic) (peripheral): Secondary | ICD-10-CM | POA: Diagnosis not present

## 2018-02-20 DIAGNOSIS — M170 Bilateral primary osteoarthritis of knee: Secondary | ICD-10-CM | POA: Diagnosis not present

## 2018-02-22 DIAGNOSIS — S50312D Abrasion of left elbow, subsequent encounter: Secondary | ICD-10-CM | POA: Diagnosis not present

## 2018-02-22 DIAGNOSIS — E1151 Type 2 diabetes mellitus with diabetic peripheral angiopathy without gangrene: Secondary | ICD-10-CM | POA: Diagnosis not present

## 2018-02-22 DIAGNOSIS — I25119 Atherosclerotic heart disease of native coronary artery with unspecified angina pectoris: Secondary | ICD-10-CM | POA: Diagnosis not present

## 2018-02-22 DIAGNOSIS — I10 Essential (primary) hypertension: Secondary | ICD-10-CM | POA: Diagnosis not present

## 2018-02-22 DIAGNOSIS — M170 Bilateral primary osteoarthritis of knee: Secondary | ICD-10-CM | POA: Diagnosis not present

## 2018-02-22 DIAGNOSIS — I872 Venous insufficiency (chronic) (peripheral): Secondary | ICD-10-CM | POA: Diagnosis not present

## 2018-02-25 DIAGNOSIS — S50312D Abrasion of left elbow, subsequent encounter: Secondary | ICD-10-CM | POA: Diagnosis not present

## 2018-02-25 DIAGNOSIS — I872 Venous insufficiency (chronic) (peripheral): Secondary | ICD-10-CM | POA: Diagnosis not present

## 2018-02-25 DIAGNOSIS — E1151 Type 2 diabetes mellitus with diabetic peripheral angiopathy without gangrene: Secondary | ICD-10-CM | POA: Diagnosis not present

## 2018-02-25 DIAGNOSIS — I25119 Atherosclerotic heart disease of native coronary artery with unspecified angina pectoris: Secondary | ICD-10-CM | POA: Diagnosis not present

## 2018-02-25 DIAGNOSIS — I10 Essential (primary) hypertension: Secondary | ICD-10-CM | POA: Diagnosis not present

## 2018-02-25 DIAGNOSIS — M170 Bilateral primary osteoarthritis of knee: Secondary | ICD-10-CM | POA: Diagnosis not present

## 2018-02-26 ENCOUNTER — Encounter: Payer: Self-pay | Admitting: Podiatry

## 2018-02-26 ENCOUNTER — Ambulatory Visit (INDEPENDENT_AMBULATORY_CARE_PROVIDER_SITE_OTHER): Payer: Medicare Other | Admitting: Podiatry

## 2018-02-26 DIAGNOSIS — M79675 Pain in left toe(s): Secondary | ICD-10-CM

## 2018-02-26 DIAGNOSIS — E1142 Type 2 diabetes mellitus with diabetic polyneuropathy: Secondary | ICD-10-CM

## 2018-02-26 DIAGNOSIS — I739 Peripheral vascular disease, unspecified: Secondary | ICD-10-CM

## 2018-02-26 DIAGNOSIS — M79674 Pain in right toe(s): Secondary | ICD-10-CM | POA: Diagnosis not present

## 2018-02-26 DIAGNOSIS — B351 Tinea unguium: Secondary | ICD-10-CM

## 2018-02-26 NOTE — Progress Notes (Signed)
Complaint:  Visit Type: Patient returns to my office for continued preventative foot care services. Complaint: Patient states" my nails have grown long and thick and become painful to walk and wear shoes" Patient has been diagnosed with DM with pvd.. The patient presents for preventative foot care services. No changes to ROS  Podiatric Exam: Vascular: dorsalis pedis and posterior tibial pulses are not  palpable bilateral. Capillary return is immediate. Temperature gradient is WNL. Skin turgor WNL Venous stasis feet and legs. Sensorium: Diminished  Semmes Weinstein monofilament test. Normal tactile sensation bilaterally. Nail Exam: Pt has thick disfigured discolored nails with subungual debris noted bilateral entire nail hallux through fifth toenails.  Self avulsed fourth toenail right foot. Ulcer Exam: There is no evidence of ulcer or pre-ulcerative changes or infection. Orthopedic Exam: Muscle tone and strength are WNL. No limitations in general ROM. No crepitus or effusions noted. HAV with hammer toe right foot. Skin: No Porokeratosis. No infection or ulcers  Diagnosis:  Onychomycosis, , Pain in right toe, pain in left toes  Treatment & Plan Procedures and Treatment: Consent by patient was obtained for treatment procedures.   Debridement of mycotic and hypertrophic toenails, 1 through 5 bilateral and clearing of subungual debris. No ulceration, no infection noted.  ABN signed for 2019. Return Visit-Office Procedure: Patient instructed to return to the office for a follow up visit 3 months for continued evaluation and treatment.    Gardiner Barefoot DPM

## 2018-02-27 DIAGNOSIS — S50312D Abrasion of left elbow, subsequent encounter: Secondary | ICD-10-CM | POA: Diagnosis not present

## 2018-02-27 DIAGNOSIS — I25119 Atherosclerotic heart disease of native coronary artery with unspecified angina pectoris: Secondary | ICD-10-CM | POA: Diagnosis not present

## 2018-02-27 DIAGNOSIS — M25561 Pain in right knee: Secondary | ICD-10-CM | POA: Diagnosis not present

## 2018-02-27 DIAGNOSIS — M170 Bilateral primary osteoarthritis of knee: Secondary | ICD-10-CM | POA: Diagnosis not present

## 2018-02-27 DIAGNOSIS — I10 Essential (primary) hypertension: Secondary | ICD-10-CM | POA: Diagnosis not present

## 2018-02-27 DIAGNOSIS — E1151 Type 2 diabetes mellitus with diabetic peripheral angiopathy without gangrene: Secondary | ICD-10-CM | POA: Diagnosis not present

## 2018-02-27 DIAGNOSIS — I872 Venous insufficiency (chronic) (peripheral): Secondary | ICD-10-CM | POA: Diagnosis not present

## 2018-03-01 DIAGNOSIS — M170 Bilateral primary osteoarthritis of knee: Secondary | ICD-10-CM | POA: Diagnosis not present

## 2018-03-01 DIAGNOSIS — S50312D Abrasion of left elbow, subsequent encounter: Secondary | ICD-10-CM | POA: Diagnosis not present

## 2018-03-01 DIAGNOSIS — I872 Venous insufficiency (chronic) (peripheral): Secondary | ICD-10-CM | POA: Diagnosis not present

## 2018-03-01 DIAGNOSIS — E1151 Type 2 diabetes mellitus with diabetic peripheral angiopathy without gangrene: Secondary | ICD-10-CM | POA: Diagnosis not present

## 2018-03-01 DIAGNOSIS — I10 Essential (primary) hypertension: Secondary | ICD-10-CM | POA: Diagnosis not present

## 2018-03-01 DIAGNOSIS — I25119 Atherosclerotic heart disease of native coronary artery with unspecified angina pectoris: Secondary | ICD-10-CM | POA: Diagnosis not present

## 2018-03-04 DIAGNOSIS — I10 Essential (primary) hypertension: Secondary | ICD-10-CM | POA: Diagnosis not present

## 2018-03-04 DIAGNOSIS — S50312D Abrasion of left elbow, subsequent encounter: Secondary | ICD-10-CM | POA: Diagnosis not present

## 2018-03-04 DIAGNOSIS — I872 Venous insufficiency (chronic) (peripheral): Secondary | ICD-10-CM | POA: Diagnosis not present

## 2018-03-04 DIAGNOSIS — I25119 Atherosclerotic heart disease of native coronary artery with unspecified angina pectoris: Secondary | ICD-10-CM | POA: Diagnosis not present

## 2018-03-04 DIAGNOSIS — E1142 Type 2 diabetes mellitus with diabetic polyneuropathy: Secondary | ICD-10-CM | POA: Diagnosis not present

## 2018-03-04 DIAGNOSIS — M170 Bilateral primary osteoarthritis of knee: Secondary | ICD-10-CM | POA: Diagnosis not present

## 2018-03-04 DIAGNOSIS — M25561 Pain in right knee: Secondary | ICD-10-CM | POA: Diagnosis not present

## 2018-03-04 DIAGNOSIS — I739 Peripheral vascular disease, unspecified: Secondary | ICD-10-CM | POA: Diagnosis not present

## 2018-03-04 DIAGNOSIS — E1151 Type 2 diabetes mellitus with diabetic peripheral angiopathy without gangrene: Secondary | ICD-10-CM | POA: Diagnosis not present

## 2018-03-04 DIAGNOSIS — E1165 Type 2 diabetes mellitus with hyperglycemia: Secondary | ICD-10-CM | POA: Diagnosis not present

## 2018-03-04 DIAGNOSIS — Z794 Long term (current) use of insulin: Secondary | ICD-10-CM | POA: Diagnosis not present

## 2018-03-04 DIAGNOSIS — M25551 Pain in right hip: Secondary | ICD-10-CM | POA: Diagnosis not present

## 2018-03-04 DIAGNOSIS — E063 Autoimmune thyroiditis: Secondary | ICD-10-CM | POA: Diagnosis not present

## 2018-03-04 DIAGNOSIS — E039 Hypothyroidism, unspecified: Secondary | ICD-10-CM | POA: Diagnosis not present

## 2018-03-06 DIAGNOSIS — S50312D Abrasion of left elbow, subsequent encounter: Secondary | ICD-10-CM | POA: Diagnosis not present

## 2018-03-06 DIAGNOSIS — I10 Essential (primary) hypertension: Secondary | ICD-10-CM | POA: Diagnosis not present

## 2018-03-06 DIAGNOSIS — I872 Venous insufficiency (chronic) (peripheral): Secondary | ICD-10-CM | POA: Diagnosis not present

## 2018-03-06 DIAGNOSIS — E1151 Type 2 diabetes mellitus with diabetic peripheral angiopathy without gangrene: Secondary | ICD-10-CM | POA: Diagnosis not present

## 2018-03-06 DIAGNOSIS — M170 Bilateral primary osteoarthritis of knee: Secondary | ICD-10-CM | POA: Diagnosis not present

## 2018-03-06 DIAGNOSIS — I25119 Atherosclerotic heart disease of native coronary artery with unspecified angina pectoris: Secondary | ICD-10-CM | POA: Diagnosis not present

## 2018-03-08 DIAGNOSIS — I10 Essential (primary) hypertension: Secondary | ICD-10-CM | POA: Diagnosis not present

## 2018-03-08 DIAGNOSIS — I872 Venous insufficiency (chronic) (peripheral): Secondary | ICD-10-CM | POA: Diagnosis not present

## 2018-03-08 DIAGNOSIS — S50312D Abrasion of left elbow, subsequent encounter: Secondary | ICD-10-CM | POA: Diagnosis not present

## 2018-03-08 DIAGNOSIS — M170 Bilateral primary osteoarthritis of knee: Secondary | ICD-10-CM | POA: Diagnosis not present

## 2018-03-08 DIAGNOSIS — E1151 Type 2 diabetes mellitus with diabetic peripheral angiopathy without gangrene: Secondary | ICD-10-CM | POA: Diagnosis not present

## 2018-03-08 DIAGNOSIS — I25119 Atherosclerotic heart disease of native coronary artery with unspecified angina pectoris: Secondary | ICD-10-CM | POA: Diagnosis not present

## 2018-03-11 DIAGNOSIS — I10 Essential (primary) hypertension: Secondary | ICD-10-CM | POA: Diagnosis not present

## 2018-03-11 DIAGNOSIS — M170 Bilateral primary osteoarthritis of knee: Secondary | ICD-10-CM | POA: Diagnosis not present

## 2018-03-11 DIAGNOSIS — E1151 Type 2 diabetes mellitus with diabetic peripheral angiopathy without gangrene: Secondary | ICD-10-CM | POA: Diagnosis not present

## 2018-03-11 DIAGNOSIS — I25119 Atherosclerotic heart disease of native coronary artery with unspecified angina pectoris: Secondary | ICD-10-CM | POA: Diagnosis not present

## 2018-03-11 DIAGNOSIS — I872 Venous insufficiency (chronic) (peripheral): Secondary | ICD-10-CM | POA: Diagnosis not present

## 2018-03-11 DIAGNOSIS — S50312D Abrasion of left elbow, subsequent encounter: Secondary | ICD-10-CM | POA: Diagnosis not present

## 2018-03-13 DIAGNOSIS — E1151 Type 2 diabetes mellitus with diabetic peripheral angiopathy without gangrene: Secondary | ICD-10-CM | POA: Diagnosis not present

## 2018-03-13 DIAGNOSIS — S50312D Abrasion of left elbow, subsequent encounter: Secondary | ICD-10-CM | POA: Diagnosis not present

## 2018-03-13 DIAGNOSIS — I872 Venous insufficiency (chronic) (peripheral): Secondary | ICD-10-CM | POA: Diagnosis not present

## 2018-03-13 DIAGNOSIS — I10 Essential (primary) hypertension: Secondary | ICD-10-CM | POA: Diagnosis not present

## 2018-03-13 DIAGNOSIS — I25119 Atherosclerotic heart disease of native coronary artery with unspecified angina pectoris: Secondary | ICD-10-CM | POA: Diagnosis not present

## 2018-03-13 DIAGNOSIS — M170 Bilateral primary osteoarthritis of knee: Secondary | ICD-10-CM | POA: Diagnosis not present

## 2018-03-19 DIAGNOSIS — E1151 Type 2 diabetes mellitus with diabetic peripheral angiopathy without gangrene: Secondary | ICD-10-CM | POA: Diagnosis not present

## 2018-03-19 DIAGNOSIS — I872 Venous insufficiency (chronic) (peripheral): Secondary | ICD-10-CM | POA: Diagnosis not present

## 2018-03-19 DIAGNOSIS — I25119 Atherosclerotic heart disease of native coronary artery with unspecified angina pectoris: Secondary | ICD-10-CM | POA: Diagnosis not present

## 2018-03-19 DIAGNOSIS — I10 Essential (primary) hypertension: Secondary | ICD-10-CM | POA: Diagnosis not present

## 2018-03-19 DIAGNOSIS — S50312D Abrasion of left elbow, subsequent encounter: Secondary | ICD-10-CM | POA: Diagnosis not present

## 2018-03-19 DIAGNOSIS — M170 Bilateral primary osteoarthritis of knee: Secondary | ICD-10-CM | POA: Diagnosis not present

## 2018-03-20 DIAGNOSIS — Z794 Long term (current) use of insulin: Secondary | ICD-10-CM | POA: Diagnosis not present

## 2018-03-20 DIAGNOSIS — Z86718 Personal history of other venous thrombosis and embolism: Secondary | ICD-10-CM | POA: Diagnosis not present

## 2018-03-20 DIAGNOSIS — Z9181 History of falling: Secondary | ICD-10-CM | POA: Diagnosis not present

## 2018-03-20 DIAGNOSIS — I25119 Atherosclerotic heart disease of native coronary artery with unspecified angina pectoris: Secondary | ICD-10-CM | POA: Diagnosis not present

## 2018-03-20 DIAGNOSIS — M109 Gout, unspecified: Secondary | ICD-10-CM | POA: Diagnosis not present

## 2018-03-20 DIAGNOSIS — S50312D Abrasion of left elbow, subsequent encounter: Secondary | ICD-10-CM | POA: Diagnosis not present

## 2018-03-20 DIAGNOSIS — E1142 Type 2 diabetes mellitus with diabetic polyneuropathy: Secondary | ICD-10-CM | POA: Diagnosis not present

## 2018-03-20 DIAGNOSIS — J449 Chronic obstructive pulmonary disease, unspecified: Secondary | ICD-10-CM | POA: Diagnosis not present

## 2018-03-20 DIAGNOSIS — I252 Old myocardial infarction: Secondary | ICD-10-CM | POA: Diagnosis not present

## 2018-03-20 DIAGNOSIS — M170 Bilateral primary osteoarthritis of knee: Secondary | ICD-10-CM | POA: Diagnosis not present

## 2018-03-20 DIAGNOSIS — Z87891 Personal history of nicotine dependence: Secondary | ICD-10-CM | POA: Diagnosis not present

## 2018-03-20 DIAGNOSIS — E1151 Type 2 diabetes mellitus with diabetic peripheral angiopathy without gangrene: Secondary | ICD-10-CM | POA: Diagnosis not present

## 2018-03-20 DIAGNOSIS — E785 Hyperlipidemia, unspecified: Secondary | ICD-10-CM | POA: Diagnosis not present

## 2018-03-20 DIAGNOSIS — I10 Essential (primary) hypertension: Secondary | ICD-10-CM | POA: Diagnosis not present

## 2018-03-20 DIAGNOSIS — K219 Gastro-esophageal reflux disease without esophagitis: Secondary | ICD-10-CM | POA: Diagnosis not present

## 2018-03-20 DIAGNOSIS — E063 Autoimmune thyroiditis: Secondary | ICD-10-CM | POA: Diagnosis not present

## 2018-03-20 DIAGNOSIS — Z6838 Body mass index (BMI) 38.0-38.9, adult: Secondary | ICD-10-CM | POA: Diagnosis not present

## 2018-03-20 DIAGNOSIS — I872 Venous insufficiency (chronic) (peripheral): Secondary | ICD-10-CM | POA: Diagnosis not present

## 2018-03-21 DIAGNOSIS — I25119 Atherosclerotic heart disease of native coronary artery with unspecified angina pectoris: Secondary | ICD-10-CM | POA: Diagnosis not present

## 2018-03-21 DIAGNOSIS — I872 Venous insufficiency (chronic) (peripheral): Secondary | ICD-10-CM | POA: Diagnosis not present

## 2018-03-21 DIAGNOSIS — M170 Bilateral primary osteoarthritis of knee: Secondary | ICD-10-CM | POA: Diagnosis not present

## 2018-03-21 DIAGNOSIS — E1151 Type 2 diabetes mellitus with diabetic peripheral angiopathy without gangrene: Secondary | ICD-10-CM | POA: Diagnosis not present

## 2018-03-21 DIAGNOSIS — I10 Essential (primary) hypertension: Secondary | ICD-10-CM | POA: Diagnosis not present

## 2018-03-21 DIAGNOSIS — S50312D Abrasion of left elbow, subsequent encounter: Secondary | ICD-10-CM | POA: Diagnosis not present

## 2018-03-26 DIAGNOSIS — M170 Bilateral primary osteoarthritis of knee: Secondary | ICD-10-CM | POA: Diagnosis not present

## 2018-03-26 DIAGNOSIS — I872 Venous insufficiency (chronic) (peripheral): Secondary | ICD-10-CM | POA: Diagnosis not present

## 2018-03-26 DIAGNOSIS — I25119 Atherosclerotic heart disease of native coronary artery with unspecified angina pectoris: Secondary | ICD-10-CM | POA: Diagnosis not present

## 2018-03-26 DIAGNOSIS — I10 Essential (primary) hypertension: Secondary | ICD-10-CM | POA: Diagnosis not present

## 2018-03-26 DIAGNOSIS — S50312D Abrasion of left elbow, subsequent encounter: Secondary | ICD-10-CM | POA: Diagnosis not present

## 2018-03-26 DIAGNOSIS — E1151 Type 2 diabetes mellitus with diabetic peripheral angiopathy without gangrene: Secondary | ICD-10-CM | POA: Diagnosis not present

## 2018-03-28 DIAGNOSIS — S50312D Abrasion of left elbow, subsequent encounter: Secondary | ICD-10-CM | POA: Diagnosis not present

## 2018-03-28 DIAGNOSIS — E1151 Type 2 diabetes mellitus with diabetic peripheral angiopathy without gangrene: Secondary | ICD-10-CM | POA: Diagnosis not present

## 2018-03-28 DIAGNOSIS — I872 Venous insufficiency (chronic) (peripheral): Secondary | ICD-10-CM | POA: Diagnosis not present

## 2018-03-28 DIAGNOSIS — I25119 Atherosclerotic heart disease of native coronary artery with unspecified angina pectoris: Secondary | ICD-10-CM | POA: Diagnosis not present

## 2018-03-28 DIAGNOSIS — I10 Essential (primary) hypertension: Secondary | ICD-10-CM | POA: Diagnosis not present

## 2018-03-28 DIAGNOSIS — M170 Bilateral primary osteoarthritis of knee: Secondary | ICD-10-CM | POA: Diagnosis not present

## 2018-04-02 DIAGNOSIS — E1151 Type 2 diabetes mellitus with diabetic peripheral angiopathy without gangrene: Secondary | ICD-10-CM | POA: Diagnosis not present

## 2018-04-02 DIAGNOSIS — M170 Bilateral primary osteoarthritis of knee: Secondary | ICD-10-CM | POA: Diagnosis not present

## 2018-04-02 DIAGNOSIS — I10 Essential (primary) hypertension: Secondary | ICD-10-CM | POA: Diagnosis not present

## 2018-04-02 DIAGNOSIS — S50312D Abrasion of left elbow, subsequent encounter: Secondary | ICD-10-CM | POA: Diagnosis not present

## 2018-04-02 DIAGNOSIS — I25119 Atherosclerotic heart disease of native coronary artery with unspecified angina pectoris: Secondary | ICD-10-CM | POA: Diagnosis not present

## 2018-04-02 DIAGNOSIS — I872 Venous insufficiency (chronic) (peripheral): Secondary | ICD-10-CM | POA: Diagnosis not present

## 2018-04-04 DIAGNOSIS — I872 Venous insufficiency (chronic) (peripheral): Secondary | ICD-10-CM | POA: Diagnosis not present

## 2018-04-04 DIAGNOSIS — E1151 Type 2 diabetes mellitus with diabetic peripheral angiopathy without gangrene: Secondary | ICD-10-CM | POA: Diagnosis not present

## 2018-04-04 DIAGNOSIS — I25119 Atherosclerotic heart disease of native coronary artery with unspecified angina pectoris: Secondary | ICD-10-CM | POA: Diagnosis not present

## 2018-04-04 DIAGNOSIS — S50312D Abrasion of left elbow, subsequent encounter: Secondary | ICD-10-CM | POA: Diagnosis not present

## 2018-04-04 DIAGNOSIS — I10 Essential (primary) hypertension: Secondary | ICD-10-CM | POA: Diagnosis not present

## 2018-04-04 DIAGNOSIS — M170 Bilateral primary osteoarthritis of knee: Secondary | ICD-10-CM | POA: Diagnosis not present

## 2018-04-11 DIAGNOSIS — I872 Venous insufficiency (chronic) (peripheral): Secondary | ICD-10-CM | POA: Diagnosis not present

## 2018-04-11 DIAGNOSIS — I10 Essential (primary) hypertension: Secondary | ICD-10-CM | POA: Diagnosis not present

## 2018-04-11 DIAGNOSIS — S50312D Abrasion of left elbow, subsequent encounter: Secondary | ICD-10-CM | POA: Diagnosis not present

## 2018-04-11 DIAGNOSIS — E1151 Type 2 diabetes mellitus with diabetic peripheral angiopathy without gangrene: Secondary | ICD-10-CM | POA: Diagnosis not present

## 2018-04-11 DIAGNOSIS — I25119 Atherosclerotic heart disease of native coronary artery with unspecified angina pectoris: Secondary | ICD-10-CM | POA: Diagnosis not present

## 2018-04-11 DIAGNOSIS — M170 Bilateral primary osteoarthritis of knee: Secondary | ICD-10-CM | POA: Diagnosis not present

## 2018-04-12 DIAGNOSIS — I872 Venous insufficiency (chronic) (peripheral): Secondary | ICD-10-CM | POA: Diagnosis not present

## 2018-04-12 DIAGNOSIS — I10 Essential (primary) hypertension: Secondary | ICD-10-CM | POA: Diagnosis not present

## 2018-04-12 DIAGNOSIS — M170 Bilateral primary osteoarthritis of knee: Secondary | ICD-10-CM | POA: Diagnosis not present

## 2018-04-12 DIAGNOSIS — E1151 Type 2 diabetes mellitus with diabetic peripheral angiopathy without gangrene: Secondary | ICD-10-CM | POA: Diagnosis not present

## 2018-04-12 DIAGNOSIS — I25119 Atherosclerotic heart disease of native coronary artery with unspecified angina pectoris: Secondary | ICD-10-CM | POA: Diagnosis not present

## 2018-04-12 DIAGNOSIS — S50312D Abrasion of left elbow, subsequent encounter: Secondary | ICD-10-CM | POA: Diagnosis not present

## 2018-04-15 DIAGNOSIS — I10 Essential (primary) hypertension: Secondary | ICD-10-CM | POA: Diagnosis not present

## 2018-04-15 DIAGNOSIS — S50312D Abrasion of left elbow, subsequent encounter: Secondary | ICD-10-CM | POA: Diagnosis not present

## 2018-04-15 DIAGNOSIS — E1151 Type 2 diabetes mellitus with diabetic peripheral angiopathy without gangrene: Secondary | ICD-10-CM | POA: Diagnosis not present

## 2018-04-15 DIAGNOSIS — M170 Bilateral primary osteoarthritis of knee: Secondary | ICD-10-CM | POA: Diagnosis not present

## 2018-04-15 DIAGNOSIS — I872 Venous insufficiency (chronic) (peripheral): Secondary | ICD-10-CM | POA: Diagnosis not present

## 2018-04-15 DIAGNOSIS — I25119 Atherosclerotic heart disease of native coronary artery with unspecified angina pectoris: Secondary | ICD-10-CM | POA: Diagnosis not present

## 2018-04-17 DIAGNOSIS — I10 Essential (primary) hypertension: Secondary | ICD-10-CM | POA: Diagnosis not present

## 2018-04-17 DIAGNOSIS — I25119 Atherosclerotic heart disease of native coronary artery with unspecified angina pectoris: Secondary | ICD-10-CM | POA: Diagnosis not present

## 2018-04-17 DIAGNOSIS — I872 Venous insufficiency (chronic) (peripheral): Secondary | ICD-10-CM | POA: Diagnosis not present

## 2018-04-17 DIAGNOSIS — S50312D Abrasion of left elbow, subsequent encounter: Secondary | ICD-10-CM | POA: Diagnosis not present

## 2018-04-17 DIAGNOSIS — E1151 Type 2 diabetes mellitus with diabetic peripheral angiopathy without gangrene: Secondary | ICD-10-CM | POA: Diagnosis not present

## 2018-04-17 DIAGNOSIS — M170 Bilateral primary osteoarthritis of knee: Secondary | ICD-10-CM | POA: Diagnosis not present

## 2018-04-19 DIAGNOSIS — J449 Chronic obstructive pulmonary disease, unspecified: Secondary | ICD-10-CM | POA: Diagnosis not present

## 2018-04-19 DIAGNOSIS — I10 Essential (primary) hypertension: Secondary | ICD-10-CM | POA: Diagnosis not present

## 2018-04-19 DIAGNOSIS — Z86718 Personal history of other venous thrombosis and embolism: Secondary | ICD-10-CM | POA: Diagnosis not present

## 2018-04-19 DIAGNOSIS — Z9181 History of falling: Secondary | ICD-10-CM | POA: Diagnosis not present

## 2018-04-19 DIAGNOSIS — E785 Hyperlipidemia, unspecified: Secondary | ICD-10-CM | POA: Diagnosis not present

## 2018-04-19 DIAGNOSIS — S51812D Laceration without foreign body of left forearm, subsequent encounter: Secondary | ICD-10-CM | POA: Diagnosis not present

## 2018-04-19 DIAGNOSIS — I252 Old myocardial infarction: Secondary | ICD-10-CM | POA: Diagnosis not present

## 2018-04-19 DIAGNOSIS — I2510 Atherosclerotic heart disease of native coronary artery without angina pectoris: Secondary | ICD-10-CM | POA: Diagnosis not present

## 2018-04-19 DIAGNOSIS — Z794 Long term (current) use of insulin: Secondary | ICD-10-CM | POA: Diagnosis not present

## 2018-04-19 DIAGNOSIS — Z6838 Body mass index (BMI) 38.0-38.9, adult: Secondary | ICD-10-CM | POA: Diagnosis not present

## 2018-04-19 DIAGNOSIS — I872 Venous insufficiency (chronic) (peripheral): Secondary | ICD-10-CM | POA: Diagnosis not present

## 2018-04-19 DIAGNOSIS — Z7982 Long term (current) use of aspirin: Secondary | ICD-10-CM | POA: Diagnosis not present

## 2018-04-19 DIAGNOSIS — Z87891 Personal history of nicotine dependence: Secondary | ICD-10-CM | POA: Diagnosis not present

## 2018-04-19 DIAGNOSIS — M170 Bilateral primary osteoarthritis of knee: Secondary | ICD-10-CM | POA: Diagnosis not present

## 2018-04-19 DIAGNOSIS — E1142 Type 2 diabetes mellitus with diabetic polyneuropathy: Secondary | ICD-10-CM | POA: Diagnosis not present

## 2018-04-19 DIAGNOSIS — M109 Gout, unspecified: Secondary | ICD-10-CM | POA: Diagnosis not present

## 2018-04-19 DIAGNOSIS — E1151 Type 2 diabetes mellitus with diabetic peripheral angiopathy without gangrene: Secondary | ICD-10-CM | POA: Diagnosis not present

## 2018-04-19 DIAGNOSIS — K219 Gastro-esophageal reflux disease without esophagitis: Secondary | ICD-10-CM | POA: Diagnosis not present

## 2018-04-19 DIAGNOSIS — E063 Autoimmune thyroiditis: Secondary | ICD-10-CM | POA: Diagnosis not present

## 2018-04-23 ENCOUNTER — Other Ambulatory Visit: Payer: Self-pay | Admitting: Emergency Medicine

## 2018-04-23 DIAGNOSIS — I10 Essential (primary) hypertension: Secondary | ICD-10-CM | POA: Diagnosis not present

## 2018-04-23 DIAGNOSIS — M170 Bilateral primary osteoarthritis of knee: Secondary | ICD-10-CM | POA: Diagnosis not present

## 2018-04-23 DIAGNOSIS — I872 Venous insufficiency (chronic) (peripheral): Secondary | ICD-10-CM | POA: Diagnosis not present

## 2018-04-23 DIAGNOSIS — I2510 Atherosclerotic heart disease of native coronary artery without angina pectoris: Secondary | ICD-10-CM | POA: Diagnosis not present

## 2018-04-23 DIAGNOSIS — E1151 Type 2 diabetes mellitus with diabetic peripheral angiopathy without gangrene: Secondary | ICD-10-CM | POA: Diagnosis not present

## 2018-04-23 DIAGNOSIS — E1142 Type 2 diabetes mellitus with diabetic polyneuropathy: Secondary | ICD-10-CM | POA: Diagnosis not present

## 2018-04-25 DIAGNOSIS — M170 Bilateral primary osteoarthritis of knee: Secondary | ICD-10-CM | POA: Diagnosis not present

## 2018-04-25 DIAGNOSIS — I872 Venous insufficiency (chronic) (peripheral): Secondary | ICD-10-CM | POA: Diagnosis not present

## 2018-04-25 DIAGNOSIS — I10 Essential (primary) hypertension: Secondary | ICD-10-CM | POA: Diagnosis not present

## 2018-04-25 DIAGNOSIS — I2510 Atherosclerotic heart disease of native coronary artery without angina pectoris: Secondary | ICD-10-CM | POA: Diagnosis not present

## 2018-04-25 DIAGNOSIS — E1151 Type 2 diabetes mellitus with diabetic peripheral angiopathy without gangrene: Secondary | ICD-10-CM | POA: Diagnosis not present

## 2018-04-25 DIAGNOSIS — E1142 Type 2 diabetes mellitus with diabetic polyneuropathy: Secondary | ICD-10-CM | POA: Diagnosis not present

## 2018-04-29 DIAGNOSIS — E1142 Type 2 diabetes mellitus with diabetic polyneuropathy: Secondary | ICD-10-CM | POA: Diagnosis not present

## 2018-04-29 DIAGNOSIS — I10 Essential (primary) hypertension: Secondary | ICD-10-CM | POA: Diagnosis not present

## 2018-04-29 DIAGNOSIS — E1151 Type 2 diabetes mellitus with diabetic peripheral angiopathy without gangrene: Secondary | ICD-10-CM | POA: Diagnosis not present

## 2018-04-29 DIAGNOSIS — I872 Venous insufficiency (chronic) (peripheral): Secondary | ICD-10-CM | POA: Diagnosis not present

## 2018-04-29 DIAGNOSIS — M170 Bilateral primary osteoarthritis of knee: Secondary | ICD-10-CM | POA: Diagnosis not present

## 2018-04-29 DIAGNOSIS — I2510 Atherosclerotic heart disease of native coronary artery without angina pectoris: Secondary | ICD-10-CM | POA: Diagnosis not present

## 2018-05-01 DIAGNOSIS — I2510 Atherosclerotic heart disease of native coronary artery without angina pectoris: Secondary | ICD-10-CM | POA: Diagnosis not present

## 2018-05-01 DIAGNOSIS — I872 Venous insufficiency (chronic) (peripheral): Secondary | ICD-10-CM | POA: Diagnosis not present

## 2018-05-01 DIAGNOSIS — I10 Essential (primary) hypertension: Secondary | ICD-10-CM | POA: Diagnosis not present

## 2018-05-01 DIAGNOSIS — M170 Bilateral primary osteoarthritis of knee: Secondary | ICD-10-CM | POA: Diagnosis not present

## 2018-05-01 DIAGNOSIS — E1151 Type 2 diabetes mellitus with diabetic peripheral angiopathy without gangrene: Secondary | ICD-10-CM | POA: Diagnosis not present

## 2018-05-01 DIAGNOSIS — E1142 Type 2 diabetes mellitus with diabetic polyneuropathy: Secondary | ICD-10-CM | POA: Diagnosis not present

## 2018-05-08 DIAGNOSIS — M170 Bilateral primary osteoarthritis of knee: Secondary | ICD-10-CM | POA: Diagnosis not present

## 2018-05-08 DIAGNOSIS — I2510 Atherosclerotic heart disease of native coronary artery without angina pectoris: Secondary | ICD-10-CM | POA: Diagnosis not present

## 2018-05-08 DIAGNOSIS — I872 Venous insufficiency (chronic) (peripheral): Secondary | ICD-10-CM | POA: Diagnosis not present

## 2018-05-08 DIAGNOSIS — E1151 Type 2 diabetes mellitus with diabetic peripheral angiopathy without gangrene: Secondary | ICD-10-CM | POA: Diagnosis not present

## 2018-05-08 DIAGNOSIS — E1142 Type 2 diabetes mellitus with diabetic polyneuropathy: Secondary | ICD-10-CM | POA: Diagnosis not present

## 2018-05-08 DIAGNOSIS — I10 Essential (primary) hypertension: Secondary | ICD-10-CM | POA: Diagnosis not present

## 2018-05-14 DIAGNOSIS — I2510 Atherosclerotic heart disease of native coronary artery without angina pectoris: Secondary | ICD-10-CM | POA: Diagnosis not present

## 2018-05-14 DIAGNOSIS — E1151 Type 2 diabetes mellitus with diabetic peripheral angiopathy without gangrene: Secondary | ICD-10-CM | POA: Diagnosis not present

## 2018-05-14 DIAGNOSIS — I10 Essential (primary) hypertension: Secondary | ICD-10-CM | POA: Diagnosis not present

## 2018-05-14 DIAGNOSIS — I872 Venous insufficiency (chronic) (peripheral): Secondary | ICD-10-CM | POA: Diagnosis not present

## 2018-05-14 DIAGNOSIS — E1142 Type 2 diabetes mellitus with diabetic polyneuropathy: Secondary | ICD-10-CM | POA: Diagnosis not present

## 2018-05-14 DIAGNOSIS — M170 Bilateral primary osteoarthritis of knee: Secondary | ICD-10-CM | POA: Diagnosis not present

## 2018-05-19 DIAGNOSIS — Z86718 Personal history of other venous thrombosis and embolism: Secondary | ICD-10-CM | POA: Diagnosis not present

## 2018-05-19 DIAGNOSIS — Z794 Long term (current) use of insulin: Secondary | ICD-10-CM | POA: Diagnosis not present

## 2018-05-19 DIAGNOSIS — K219 Gastro-esophageal reflux disease without esophagitis: Secondary | ICD-10-CM | POA: Diagnosis not present

## 2018-05-19 DIAGNOSIS — Z87891 Personal history of nicotine dependence: Secondary | ICD-10-CM | POA: Diagnosis not present

## 2018-05-19 DIAGNOSIS — M109 Gout, unspecified: Secondary | ICD-10-CM | POA: Diagnosis not present

## 2018-05-19 DIAGNOSIS — I2510 Atherosclerotic heart disease of native coronary artery without angina pectoris: Secondary | ICD-10-CM | POA: Diagnosis not present

## 2018-05-19 DIAGNOSIS — S51812D Laceration without foreign body of left forearm, subsequent encounter: Secondary | ICD-10-CM | POA: Diagnosis not present

## 2018-05-19 DIAGNOSIS — Z6838 Body mass index (BMI) 38.0-38.9, adult: Secondary | ICD-10-CM | POA: Diagnosis not present

## 2018-05-19 DIAGNOSIS — M170 Bilateral primary osteoarthritis of knee: Secondary | ICD-10-CM | POA: Diagnosis not present

## 2018-05-19 DIAGNOSIS — I872 Venous insufficiency (chronic) (peripheral): Secondary | ICD-10-CM | POA: Diagnosis not present

## 2018-05-19 DIAGNOSIS — J449 Chronic obstructive pulmonary disease, unspecified: Secondary | ICD-10-CM | POA: Diagnosis not present

## 2018-05-19 DIAGNOSIS — E785 Hyperlipidemia, unspecified: Secondary | ICD-10-CM | POA: Diagnosis not present

## 2018-05-19 DIAGNOSIS — I252 Old myocardial infarction: Secondary | ICD-10-CM | POA: Diagnosis not present

## 2018-05-19 DIAGNOSIS — Z7982 Long term (current) use of aspirin: Secondary | ICD-10-CM | POA: Diagnosis not present

## 2018-05-19 DIAGNOSIS — E063 Autoimmune thyroiditis: Secondary | ICD-10-CM | POA: Diagnosis not present

## 2018-05-19 DIAGNOSIS — I10 Essential (primary) hypertension: Secondary | ICD-10-CM | POA: Diagnosis not present

## 2018-05-19 DIAGNOSIS — Z9181 History of falling: Secondary | ICD-10-CM | POA: Diagnosis not present

## 2018-05-19 DIAGNOSIS — E1142 Type 2 diabetes mellitus with diabetic polyneuropathy: Secondary | ICD-10-CM | POA: Diagnosis not present

## 2018-05-19 DIAGNOSIS — E1151 Type 2 diabetes mellitus with diabetic peripheral angiopathy without gangrene: Secondary | ICD-10-CM | POA: Diagnosis not present

## 2018-05-21 DIAGNOSIS — M170 Bilateral primary osteoarthritis of knee: Secondary | ICD-10-CM | POA: Diagnosis not present

## 2018-05-21 DIAGNOSIS — I10 Essential (primary) hypertension: Secondary | ICD-10-CM | POA: Diagnosis not present

## 2018-05-21 DIAGNOSIS — I2510 Atherosclerotic heart disease of native coronary artery without angina pectoris: Secondary | ICD-10-CM | POA: Diagnosis not present

## 2018-05-21 DIAGNOSIS — E1142 Type 2 diabetes mellitus with diabetic polyneuropathy: Secondary | ICD-10-CM | POA: Diagnosis not present

## 2018-05-21 DIAGNOSIS — E1151 Type 2 diabetes mellitus with diabetic peripheral angiopathy without gangrene: Secondary | ICD-10-CM | POA: Diagnosis not present

## 2018-05-21 DIAGNOSIS — I872 Venous insufficiency (chronic) (peripheral): Secondary | ICD-10-CM | POA: Diagnosis not present

## 2018-05-28 ENCOUNTER — Ambulatory Visit: Payer: Medicare Other | Admitting: Podiatry

## 2018-05-29 DIAGNOSIS — I872 Venous insufficiency (chronic) (peripheral): Secondary | ICD-10-CM | POA: Diagnosis not present

## 2018-05-29 DIAGNOSIS — E1151 Type 2 diabetes mellitus with diabetic peripheral angiopathy without gangrene: Secondary | ICD-10-CM | POA: Diagnosis not present

## 2018-05-29 DIAGNOSIS — I2510 Atherosclerotic heart disease of native coronary artery without angina pectoris: Secondary | ICD-10-CM | POA: Diagnosis not present

## 2018-05-29 DIAGNOSIS — I10 Essential (primary) hypertension: Secondary | ICD-10-CM | POA: Diagnosis not present

## 2018-05-29 DIAGNOSIS — E1142 Type 2 diabetes mellitus with diabetic polyneuropathy: Secondary | ICD-10-CM | POA: Diagnosis not present

## 2018-05-29 DIAGNOSIS — M170 Bilateral primary osteoarthritis of knee: Secondary | ICD-10-CM | POA: Diagnosis not present

## 2018-06-12 DIAGNOSIS — M170 Bilateral primary osteoarthritis of knee: Secondary | ICD-10-CM | POA: Diagnosis not present

## 2018-06-12 DIAGNOSIS — I872 Venous insufficiency (chronic) (peripheral): Secondary | ICD-10-CM | POA: Diagnosis not present

## 2018-06-12 DIAGNOSIS — I2510 Atherosclerotic heart disease of native coronary artery without angina pectoris: Secondary | ICD-10-CM | POA: Diagnosis not present

## 2018-06-12 DIAGNOSIS — E1142 Type 2 diabetes mellitus with diabetic polyneuropathy: Secondary | ICD-10-CM | POA: Diagnosis not present

## 2018-06-12 DIAGNOSIS — E1151 Type 2 diabetes mellitus with diabetic peripheral angiopathy without gangrene: Secondary | ICD-10-CM | POA: Diagnosis not present

## 2018-06-12 DIAGNOSIS — I10 Essential (primary) hypertension: Secondary | ICD-10-CM | POA: Diagnosis not present

## 2018-06-14 DIAGNOSIS — I10 Essential (primary) hypertension: Secondary | ICD-10-CM | POA: Diagnosis not present

## 2018-06-14 DIAGNOSIS — E1151 Type 2 diabetes mellitus with diabetic peripheral angiopathy without gangrene: Secondary | ICD-10-CM | POA: Diagnosis not present

## 2018-06-14 DIAGNOSIS — E1142 Type 2 diabetes mellitus with diabetic polyneuropathy: Secondary | ICD-10-CM | POA: Diagnosis not present

## 2018-06-14 DIAGNOSIS — M170 Bilateral primary osteoarthritis of knee: Secondary | ICD-10-CM | POA: Diagnosis not present

## 2018-06-14 DIAGNOSIS — I2510 Atherosclerotic heart disease of native coronary artery without angina pectoris: Secondary | ICD-10-CM | POA: Diagnosis not present

## 2018-06-14 DIAGNOSIS — I872 Venous insufficiency (chronic) (peripheral): Secondary | ICD-10-CM | POA: Diagnosis not present

## 2018-06-17 DIAGNOSIS — I872 Venous insufficiency (chronic) (peripheral): Secondary | ICD-10-CM | POA: Diagnosis not present

## 2018-06-17 DIAGNOSIS — E1151 Type 2 diabetes mellitus with diabetic peripheral angiopathy without gangrene: Secondary | ICD-10-CM | POA: Diagnosis not present

## 2018-06-17 DIAGNOSIS — I2510 Atherosclerotic heart disease of native coronary artery without angina pectoris: Secondary | ICD-10-CM | POA: Diagnosis not present

## 2018-06-17 DIAGNOSIS — M170 Bilateral primary osteoarthritis of knee: Secondary | ICD-10-CM | POA: Diagnosis not present

## 2018-06-17 DIAGNOSIS — I10 Essential (primary) hypertension: Secondary | ICD-10-CM | POA: Diagnosis not present

## 2018-06-17 DIAGNOSIS — E1142 Type 2 diabetes mellitus with diabetic polyneuropathy: Secondary | ICD-10-CM | POA: Diagnosis not present

## 2018-06-18 DIAGNOSIS — E1142 Type 2 diabetes mellitus with diabetic polyneuropathy: Secondary | ICD-10-CM | POA: Diagnosis not present

## 2018-06-18 DIAGNOSIS — Z86718 Personal history of other venous thrombosis and embolism: Secondary | ICD-10-CM | POA: Diagnosis not present

## 2018-06-18 DIAGNOSIS — J449 Chronic obstructive pulmonary disease, unspecified: Secondary | ICD-10-CM | POA: Diagnosis not present

## 2018-06-18 DIAGNOSIS — Z794 Long term (current) use of insulin: Secondary | ICD-10-CM | POA: Diagnosis not present

## 2018-06-18 DIAGNOSIS — I10 Essential (primary) hypertension: Secondary | ICD-10-CM | POA: Diagnosis not present

## 2018-06-18 DIAGNOSIS — I252 Old myocardial infarction: Secondary | ICD-10-CM | POA: Diagnosis not present

## 2018-06-18 DIAGNOSIS — Z6838 Body mass index (BMI) 38.0-38.9, adult: Secondary | ICD-10-CM | POA: Diagnosis not present

## 2018-06-18 DIAGNOSIS — E1151 Type 2 diabetes mellitus with diabetic peripheral angiopathy without gangrene: Secondary | ICD-10-CM | POA: Diagnosis not present

## 2018-06-18 DIAGNOSIS — I2510 Atherosclerotic heart disease of native coronary artery without angina pectoris: Secondary | ICD-10-CM | POA: Diagnosis not present

## 2018-06-18 DIAGNOSIS — Z9181 History of falling: Secondary | ICD-10-CM | POA: Diagnosis not present

## 2018-06-18 DIAGNOSIS — E785 Hyperlipidemia, unspecified: Secondary | ICD-10-CM | POA: Diagnosis not present

## 2018-06-18 DIAGNOSIS — K219 Gastro-esophageal reflux disease without esophagitis: Secondary | ICD-10-CM | POA: Diagnosis not present

## 2018-06-18 DIAGNOSIS — M170 Bilateral primary osteoarthritis of knee: Secondary | ICD-10-CM | POA: Diagnosis not present

## 2018-06-18 DIAGNOSIS — Z87891 Personal history of nicotine dependence: Secondary | ICD-10-CM | POA: Diagnosis not present

## 2018-06-18 DIAGNOSIS — M109 Gout, unspecified: Secondary | ICD-10-CM | POA: Diagnosis not present

## 2018-06-18 DIAGNOSIS — E063 Autoimmune thyroiditis: Secondary | ICD-10-CM | POA: Diagnosis not present

## 2018-06-18 DIAGNOSIS — I872 Venous insufficiency (chronic) (peripheral): Secondary | ICD-10-CM | POA: Diagnosis not present

## 2018-06-20 DIAGNOSIS — J449 Chronic obstructive pulmonary disease, unspecified: Secondary | ICD-10-CM | POA: Diagnosis not present

## 2018-06-20 DIAGNOSIS — I25119 Atherosclerotic heart disease of native coronary artery with unspecified angina pectoris: Secondary | ICD-10-CM | POA: Diagnosis not present

## 2018-06-20 DIAGNOSIS — I10 Essential (primary) hypertension: Secondary | ICD-10-CM | POA: Diagnosis not present

## 2018-06-20 DIAGNOSIS — M170 Bilateral primary osteoarthritis of knee: Secondary | ICD-10-CM | POA: Diagnosis not present

## 2018-06-20 DIAGNOSIS — E785 Hyperlipidemia, unspecified: Secondary | ICD-10-CM | POA: Diagnosis not present

## 2018-06-20 DIAGNOSIS — E1140 Type 2 diabetes mellitus with diabetic neuropathy, unspecified: Secondary | ICD-10-CM | POA: Diagnosis not present

## 2018-06-20 DIAGNOSIS — R05 Cough: Secondary | ICD-10-CM | POA: Diagnosis not present

## 2018-06-20 DIAGNOSIS — E039 Hypothyroidism, unspecified: Secondary | ICD-10-CM | POA: Diagnosis not present

## 2018-06-20 DIAGNOSIS — Z8739 Personal history of other diseases of the musculoskeletal system and connective tissue: Secondary | ICD-10-CM | POA: Diagnosis not present

## 2018-06-25 DIAGNOSIS — R05 Cough: Secondary | ICD-10-CM | POA: Diagnosis not present

## 2018-07-02 DIAGNOSIS — M170 Bilateral primary osteoarthritis of knee: Secondary | ICD-10-CM | POA: Diagnosis not present

## 2018-07-02 DIAGNOSIS — I2510 Atherosclerotic heart disease of native coronary artery without angina pectoris: Secondary | ICD-10-CM | POA: Diagnosis not present

## 2018-07-02 DIAGNOSIS — I10 Essential (primary) hypertension: Secondary | ICD-10-CM | POA: Diagnosis not present

## 2018-07-02 DIAGNOSIS — E1151 Type 2 diabetes mellitus with diabetic peripheral angiopathy without gangrene: Secondary | ICD-10-CM | POA: Diagnosis not present

## 2018-07-02 DIAGNOSIS — I872 Venous insufficiency (chronic) (peripheral): Secondary | ICD-10-CM | POA: Diagnosis not present

## 2018-07-02 DIAGNOSIS — E1142 Type 2 diabetes mellitus with diabetic polyneuropathy: Secondary | ICD-10-CM | POA: Diagnosis not present

## 2018-07-03 DIAGNOSIS — M170 Bilateral primary osteoarthritis of knee: Secondary | ICD-10-CM | POA: Diagnosis not present

## 2018-07-03 DIAGNOSIS — E1140 Type 2 diabetes mellitus with diabetic neuropathy, unspecified: Secondary | ICD-10-CM | POA: Diagnosis not present

## 2018-07-03 DIAGNOSIS — I10 Essential (primary) hypertension: Secondary | ICD-10-CM | POA: Diagnosis not present

## 2018-07-03 DIAGNOSIS — Z8739 Personal history of other diseases of the musculoskeletal system and connective tissue: Secondary | ICD-10-CM | POA: Diagnosis not present

## 2018-07-03 DIAGNOSIS — E785 Hyperlipidemia, unspecified: Secondary | ICD-10-CM | POA: Diagnosis not present

## 2018-07-03 DIAGNOSIS — M25551 Pain in right hip: Secondary | ICD-10-CM | POA: Diagnosis not present

## 2018-07-03 DIAGNOSIS — Z6836 Body mass index (BMI) 36.0-36.9, adult: Secondary | ICD-10-CM | POA: Diagnosis not present

## 2018-07-03 DIAGNOSIS — R05 Cough: Secondary | ICD-10-CM | POA: Diagnosis not present

## 2018-07-03 DIAGNOSIS — I25119 Atherosclerotic heart disease of native coronary artery with unspecified angina pectoris: Secondary | ICD-10-CM | POA: Diagnosis not present

## 2018-07-03 DIAGNOSIS — M25561 Pain in right knee: Secondary | ICD-10-CM | POA: Diagnosis not present

## 2018-07-03 DIAGNOSIS — E039 Hypothyroidism, unspecified: Secondary | ICD-10-CM | POA: Diagnosis not present

## 2018-07-03 DIAGNOSIS — J449 Chronic obstructive pulmonary disease, unspecified: Secondary | ICD-10-CM | POA: Diagnosis not present

## 2018-07-08 DIAGNOSIS — I10 Essential (primary) hypertension: Secondary | ICD-10-CM | POA: Diagnosis not present

## 2018-07-08 DIAGNOSIS — E1142 Type 2 diabetes mellitus with diabetic polyneuropathy: Secondary | ICD-10-CM | POA: Diagnosis not present

## 2018-07-08 DIAGNOSIS — M170 Bilateral primary osteoarthritis of knee: Secondary | ICD-10-CM | POA: Diagnosis not present

## 2018-07-08 DIAGNOSIS — E1151 Type 2 diabetes mellitus with diabetic peripheral angiopathy without gangrene: Secondary | ICD-10-CM | POA: Diagnosis not present

## 2018-07-08 DIAGNOSIS — I872 Venous insufficiency (chronic) (peripheral): Secondary | ICD-10-CM | POA: Diagnosis not present

## 2018-07-08 DIAGNOSIS — I2510 Atherosclerotic heart disease of native coronary artery without angina pectoris: Secondary | ICD-10-CM | POA: Diagnosis not present

## 2018-07-09 ENCOUNTER — Other Ambulatory Visit: Payer: Self-pay

## 2018-07-09 ENCOUNTER — Ambulatory Visit (INDEPENDENT_AMBULATORY_CARE_PROVIDER_SITE_OTHER): Payer: Medicare Other | Admitting: Podiatry

## 2018-07-09 ENCOUNTER — Encounter: Payer: Self-pay | Admitting: Podiatry

## 2018-07-09 DIAGNOSIS — B351 Tinea unguium: Secondary | ICD-10-CM

## 2018-07-09 DIAGNOSIS — M79674 Pain in right toe(s): Secondary | ICD-10-CM

## 2018-07-09 DIAGNOSIS — M79675 Pain in left toe(s): Secondary | ICD-10-CM | POA: Diagnosis not present

## 2018-07-09 DIAGNOSIS — E1159 Type 2 diabetes mellitus with other circulatory complications: Secondary | ICD-10-CM

## 2018-07-09 NOTE — Progress Notes (Signed)
Complaint:  Visit Type: Patient returns to my office for continued preventative foot care services. Complaint: Patient states" my nails have grown long and thick and become painful to walk and wear shoes" Patient has been diagnosed with DM with pvd.. The patient presents for preventative foot care services. No changes to ROS  Podiatric Exam: Vascular: dorsalis pedis and posterior tibial pulses are not  palpable bilateral. Capillary return is immediate. Temperature gradient is WNL. Skin turgor WNL Venous stasis feet and legs. Sensorium: Diminished  Semmes Weinstein monofilament test. Normal tactile sensation bilaterally. Nail Exam: Pt has thick disfigured discolored nails with subungual debris noted bilateral entire nail hallux through fifth toenails.  Self avulsed fourth toenail right foot. Ulcer Exam: There is no evidence of ulcer or pre-ulcerative changes or infection. Orthopedic Exam: Muscle tone and strength are WNL. No limitations in general ROM. No crepitus or effusions noted. HAV with hammer toe right foot. Skin: No Porokeratosis. No infection or ulcers  Diagnosis:  Onychomycosis, , Pain in right toe, pain in left toes  Treatment & Plan Procedures and Treatment: Consent by patient was obtained for treatment procedures.   Debridement of mycotic and hypertrophic toenails, 1 through 5 bilateral and clearing of subungual debris. No ulceration, no infection noted.   Return Visit-Office Procedure: Patient instructed to return to the office for a follow up visit 3 months for continued evaluation and treatment.    Gardiner Barefoot DPM

## 2018-07-16 ENCOUNTER — Ambulatory Visit (INDEPENDENT_AMBULATORY_CARE_PROVIDER_SITE_OTHER): Payer: Medicare Other | Admitting: Emergency Medicine

## 2018-07-16 ENCOUNTER — Other Ambulatory Visit: Payer: Self-pay

## 2018-07-16 ENCOUNTER — Encounter: Payer: Self-pay | Admitting: Emergency Medicine

## 2018-07-16 DIAGNOSIS — E871 Hypo-osmolality and hyponatremia: Secondary | ICD-10-CM | POA: Diagnosis not present

## 2018-07-16 DIAGNOSIS — J410 Simple chronic bronchitis: Secondary | ICD-10-CM | POA: Diagnosis not present

## 2018-07-16 NOTE — Patient Instructions (Signed)
Please continue your Anoro 1 inhalation daily as you have been taking it. Keep albuterol available use 2 puffs if needed for shortness of breath, chest tightness, wheezing. Get the flu shot this fall. Pneumonia shot received at Dr. Eddie Dibbles office Follow with Dr. Lamonte Sakai in 12 months or sooner if you have any problems.

## 2018-07-16 NOTE — Assessment & Plan Note (Signed)
Please continue your Anoro 1 inhalation daily as you have been taking it. Keep albuterol available use 2 puffs if needed for shortness of breath, chest tightness, wheezing. Get the flu shot this fall. Pneumonia shot received at Dr. Eddie Dibbles office Follow with Dr. Lamonte Sakai in 12 months or sooner if you have any problems.

## 2018-07-16 NOTE — Progress Notes (Signed)
   Subjective:    Patient ID: Ernest Aus., male    DOB: 1931/06/29, 85 y.o.   MRN: [id number]  HPI   ROV 08/14/17 --85 year old man with a history of tobacco, moderately severe obstructive disease by spirometry, COPD.  He has also been followed for a 4 mm right apical nodule that was discovered on CT scan after a trauma.  We repeated his CT on 07/03/2017 and there has been no interval change.  He should not need a repeat scan given its size.  He is currently managed on . He reports cough and scratchy throat. He is on zyrtec, uses OTC nasal spray prn. He has exertional SOB when he coughs and has mucous. He can have cough with some consistencies of liquids like milkshakes. Overall activity level is low. He has some intermittent wheeze, uses albuterol a few times a week. He believes that he has had the PNA shots with Dr Rex Kras.   ROV 07/16/2018 --annual follow-up for 85 year old gentleman with moderate severe COPD based on spirometry, allergic rhinitis with some upper airway irritation and associated cough.  He also has a history of small stable pulmonary nodular disease as described above.  Currently managed on Anoro, has albuterol which he uses approximately every other day. He hears wheeze occasionally, especially in the evening. His exercise tolerance is poor due to R knee pain and limitation. No flares, no ED visits. He reports poor sleep.    Review of Systems  Constitutional: Negative for fever and unexpected weight change.  HENT: Negative for congestion, dental problem, ear pain, nosebleeds, postnasal drip, rhinorrhea, sinus pressure, sneezing, sore throat and trouble swallowing.   Eyes: Negative for redness and itching.  Respiratory: Negative for cough, chest tightness, shortness of breath and wheezing.   Cardiovascular: Negative for palpitations and leg swelling.  Gastrointestinal: Negative for nausea and vomiting.  Genitourinary: Negative for dysuria.  Musculoskeletal: Negative for joint  swelling.  Skin: Negative for rash.  Neurological: Negative for headaches.  Hematological: Does not bruise/bleed easily.  Psychiatric/Behavioral: Negative for dysphoric mood. The patient is not nervous/anxious.        Objective:   Physical Exam Vitals:   07/16/18 1511  BP: 110/60  Pulse: 87  SpO2: 92%  Weight: 247 lb (112 kg)   Gen: Pleasant, overweight elderly gentleman, in no distress,  normal affect, in wheelchair  ENT: No lesions,  mouth clear,  oropharynx clear, no postnasal drip, poor hearing  Neck: No JVD, no stridor, strong voice.   Lungs: No use of accessory muscles, clear, no wheeze (improved)  Cardiovascular: RRR, heart sounds normal, no murmur or gallops, 1+ peripheral edema  Musculoskeletal: No deformities, no cyanosis or clubbing  Neuro: alert, non focal  Skin: Warm, no lesions or rash    Assessment & Plan:  COPD (chronic obstructive pulmonary disease) ([place]) Please continue your Anoro 1 inhalation daily as you have been taking it. Keep albuterol available use 2 puffs if needed for shortness of breath, chest tightness, wheezing. Get the flu shot this fall. Pneumonia shot received at Dr. Eddie Dibbles office Follow with Dr. Lamonte Sakai in 12 months or sooner if you have any problems.   Baltazar Apo, MD, PhD 07/16/2018, 3:24 PM [facility] [phone] or if no answer [phone]

## 2018-07-17 DIAGNOSIS — E1151 Type 2 diabetes mellitus with diabetic peripheral angiopathy without gangrene: Secondary | ICD-10-CM | POA: Diagnosis not present

## 2018-07-17 DIAGNOSIS — I10 Essential (primary) hypertension: Secondary | ICD-10-CM | POA: Diagnosis not present

## 2018-07-17 DIAGNOSIS — M170 Bilateral primary osteoarthritis of knee: Secondary | ICD-10-CM | POA: Diagnosis not present

## 2018-07-17 DIAGNOSIS — I872 Venous insufficiency (chronic) (peripheral): Secondary | ICD-10-CM | POA: Diagnosis not present

## 2018-07-17 DIAGNOSIS — I2510 Atherosclerotic heart disease of native coronary artery without angina pectoris: Secondary | ICD-10-CM | POA: Diagnosis not present

## 2018-07-17 DIAGNOSIS — E1142 Type 2 diabetes mellitus with diabetic polyneuropathy: Secondary | ICD-10-CM | POA: Diagnosis not present

## 2018-07-24 ENCOUNTER — Other Ambulatory Visit: Payer: Self-pay | Admitting: Emergency Medicine

## 2018-07-28 IMAGING — CT CT CHEST W/ CM
3 of 8 series · 16 of 36 positions shown, 18 images · IV contrast (APPLIED)
Comparison: 07/04/2016 chest x-ray.

CLINICAL DATA: 85-year-old hypertensive male post fall getting out
of bed 2 days ago hitting right-side of chest. Shortness breath.
Subsequent encounter.

EXAM:
CT CHEST WITH CONTRAST
TECHNIQUE: Multidetector CT imaging of the chest was performed during
intravenous contrast administration.
CONTRAST:  75mL QNCPO4-SYY IOPAMIDOL (QNCPO4-SYY) INJECTION 61%

[Series 3: chest wo · axial · 0.70mm/px · z∈[+1112,+1316]mm · 8 of 132 slices shown, 10 images]
[im 15/132  mediastinal]
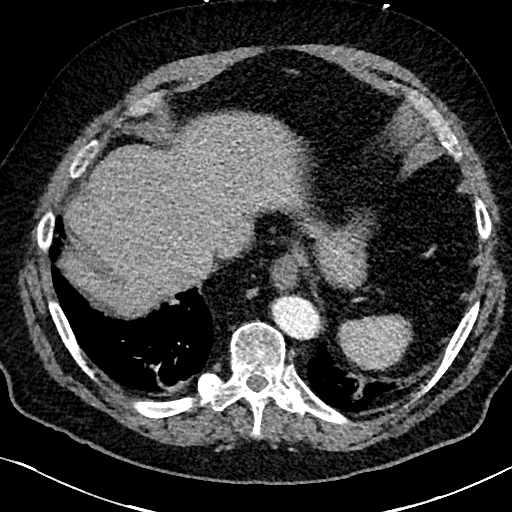
[im 15/132  lung]
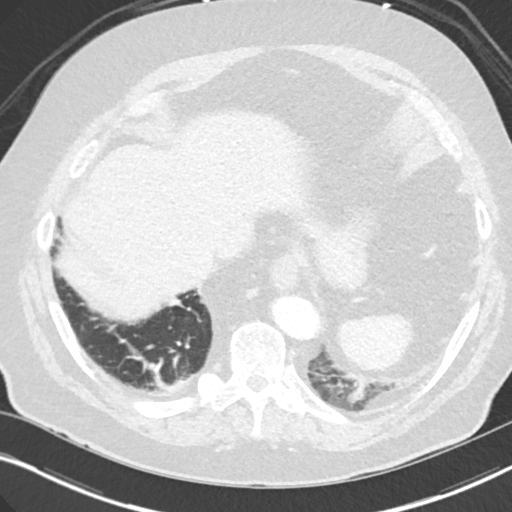
[im 30/132  lung]
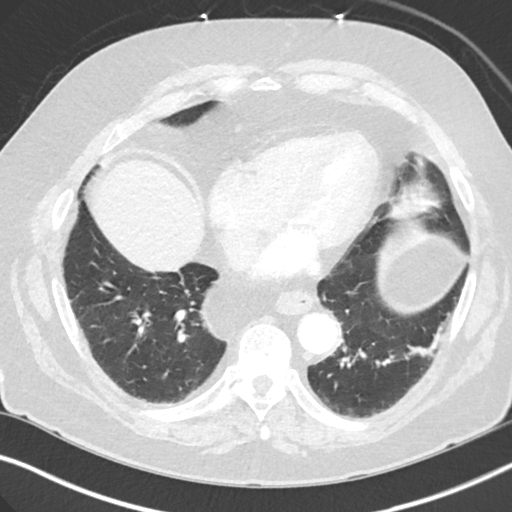
[im 44/132  lung]
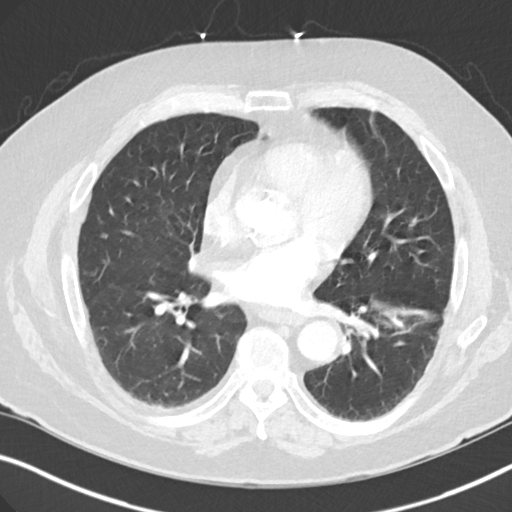
[im 59/132  lung]
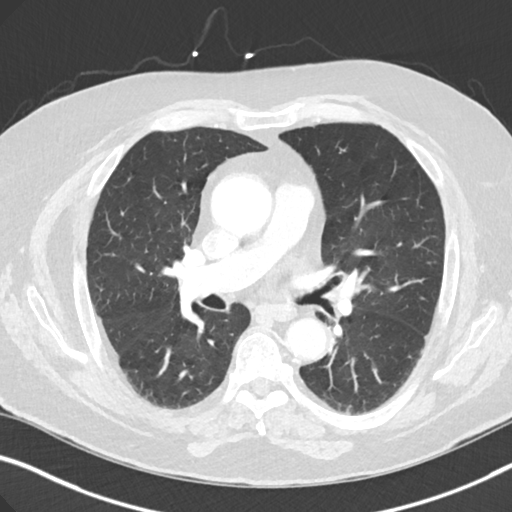
[im 73/132  mediastinal]
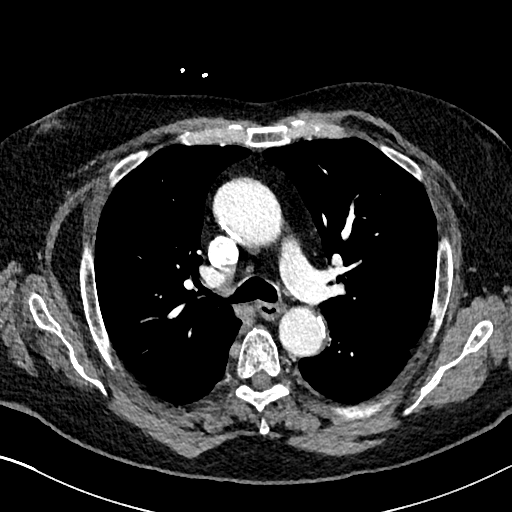
[im 73/132  lung]
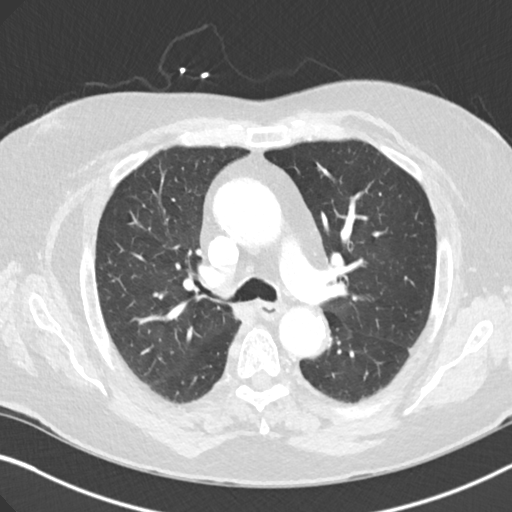
[im 88/132  lung]
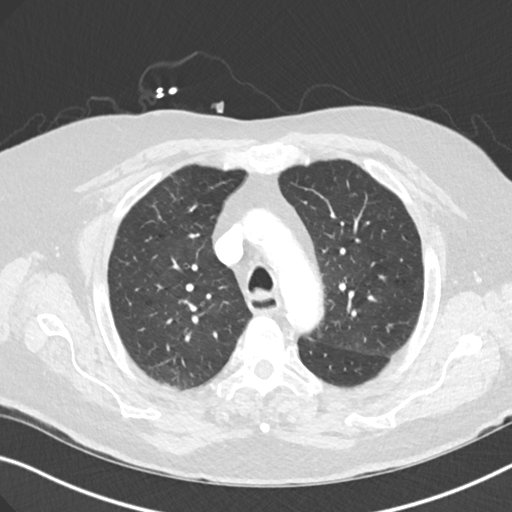
[im 102/132  lung]
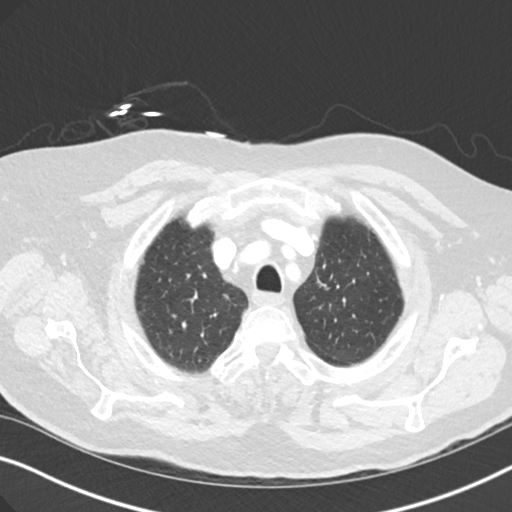
[im 117/132  lung]
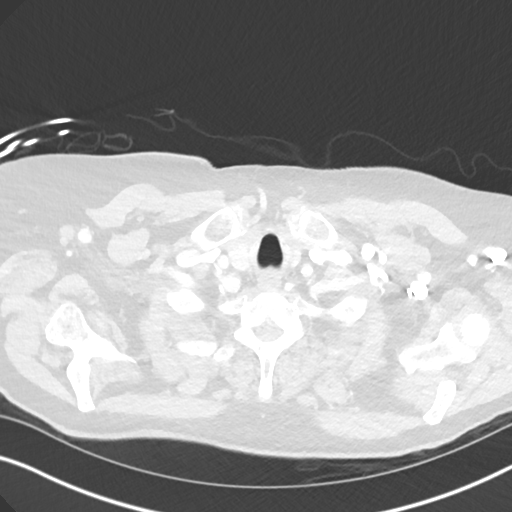

[Series 4: lungs · axial · 0.70mm/px · z∈[+1112,+1286]mm · 7 of 132 slices shown]
[im 15/132  lung]
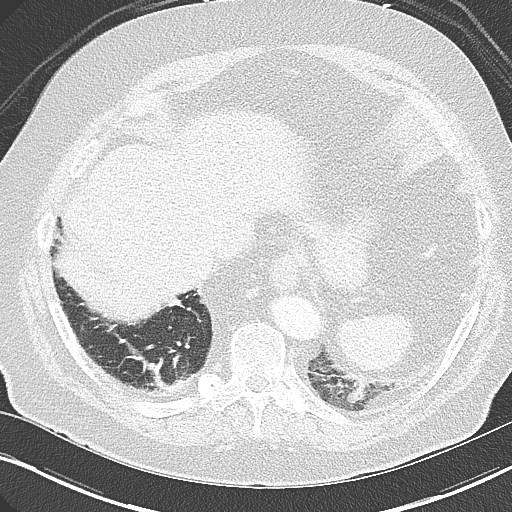
[im 30/132  lung]
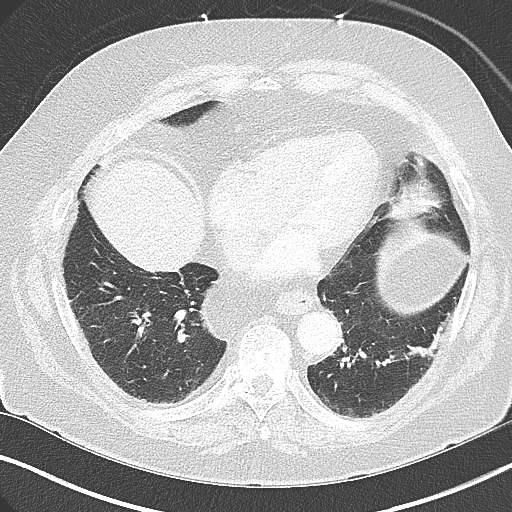
[im 44/132  lung]
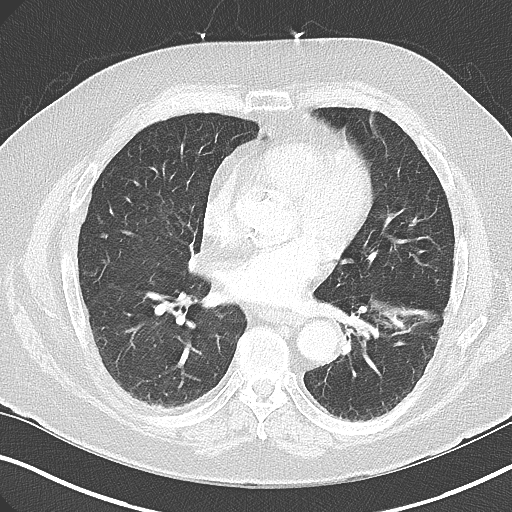
[im 59/132  lung]
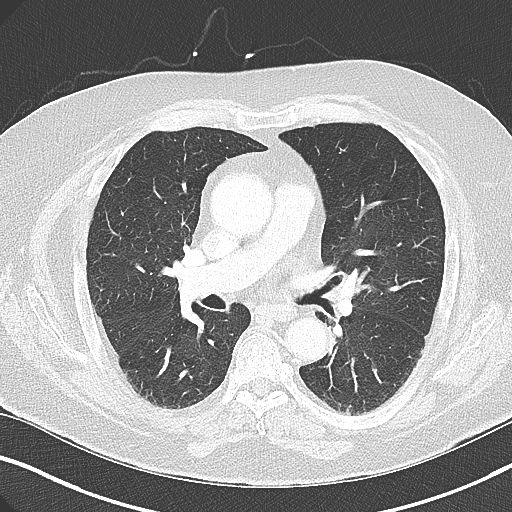
[im 73/132  lung]
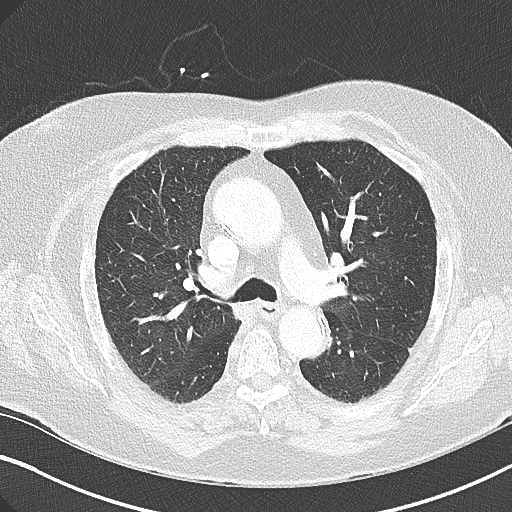
[im 88/132  lung]
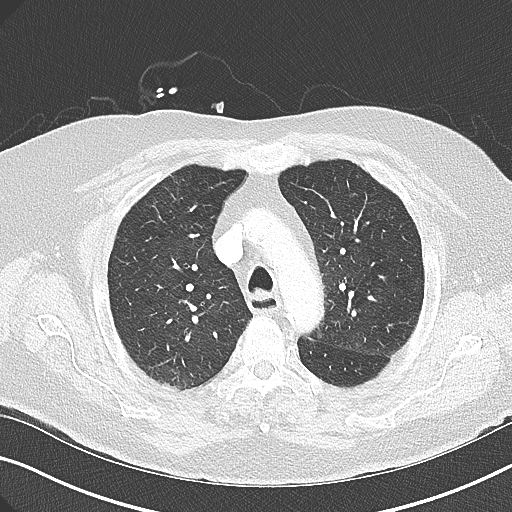
[im 102/132  lung]
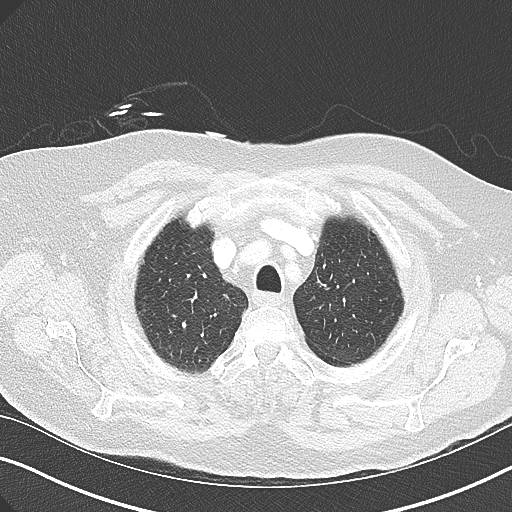

[Series 6: cor · coronal · 0.54mm/px · 1 of 153 slices shown]
[im 77/153  lung]
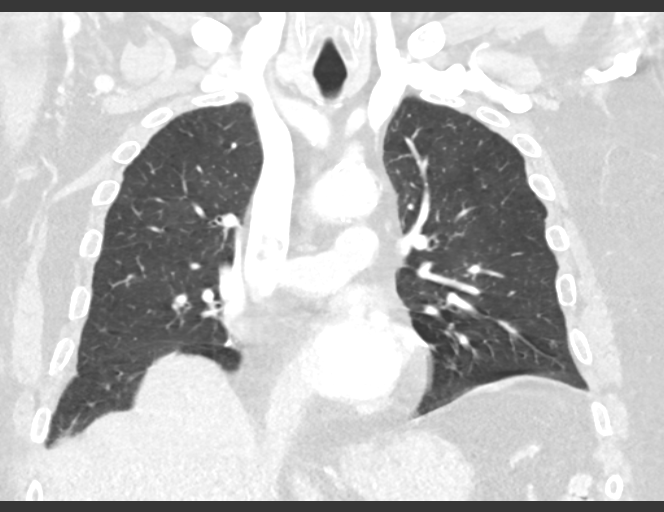

[16 of 36 positions shown; findings below may reference images not displayed]

FINDINGS: Cardiovascular: No pulmonary embolus.

No aortic dissection. Ascending thoracic aorta measures up to
cm.

Heart size within normal limits. Prominent coronary artery
calcification. Calcification aortic root.

Mediastinum/Nodes: Top-normal size pretracheal lymph node.

Lungs/Pleura: No pneumothorax. Scattered atelectatic
changes/scarring. 4 mm right apical nodule (series 4 image 24).
Trachea and mainstem bronchi are patent.

Upper Abdomen: Elevated right hemidiaphragm. No acute upper
abdominal abnormality noted. Gallstones. Right renal cyst.

Musculoskeletal: Slightly displaced fracture anterolateral aspect
right fifth and sixth rib. There may be a subtle buckle fracture of
the right seventh rib.

Broad-based lipoma right lateral chest wall.

Sclerosis at the base of the T1 spinous process and left T1 lamina.
Sclerotic metastatic disease not excluded in the proper clinical
setting. No other sclerotic foci noted.

Mild degenerative changes lower cervical spine and thoracic spine
with mild curvature thoracic spine without focal compression
fracture.
IMPRESSION: Slightly displaced fracture anterolateral aspect right fifth and
sixth rib. There may be a subtle buckle fracture of the right
seventh rib. No pneumothorax. Basilar atelectatic changes/scarring.

Broad-based lipoma right lateral chest wall.

Sclerosis at the base of the T1 spinous process and left T1 lamina.
Sclerotic metastatic disease not excluded in the proper clinical
setting. No other sclerotic foci noted.

Mild degenerative changes lower cervical spine and thoracic spine
with mild curvature thoracic spine without focal compression
fracture.

4 mm right apical nodule (series 4 image 24). No follow-up needed if
patient is low-risk. Non-contrast chest CT can be considered in 12
months if patient is high-risk. This recommendation follows the
consensus statement: Guidelines for Management of Incidental
Pulmonary Nodules Detected on CT Images: From the [HOSPITAL]

Aortic Atherosclerosis (KC2CA-PJS.S). Ascending thoracic aorta
measures up to 4.1 cm.

Prominent coronary artery calcification.

## 2018-07-28 IMAGING — CT CT HEAD W/O CM
5 of 8 series · 17 of 47 positions shown, 18 images · non-contrast
Comparison: 03/07/2015

CLINICAL DATA: Fall, hit head.

EXAM:
CT HEAD WITHOUT CONTRAST
CT CERVICAL SPINE WITHOUT CONTRAST
TECHNIQUE: Multidetector CT imaging of the head and cervical spine was
performed following the standard protocol without intravenous
contrast. Multiplanar CT image reconstructions of the cervical spine
were also generated.

[Series 4: head without · axial · non-contrast · 0.45mm/px · z∈[+1149,+1314]mm · 3 of 34 slices shown, 4 images]
[im 1/34  brain]
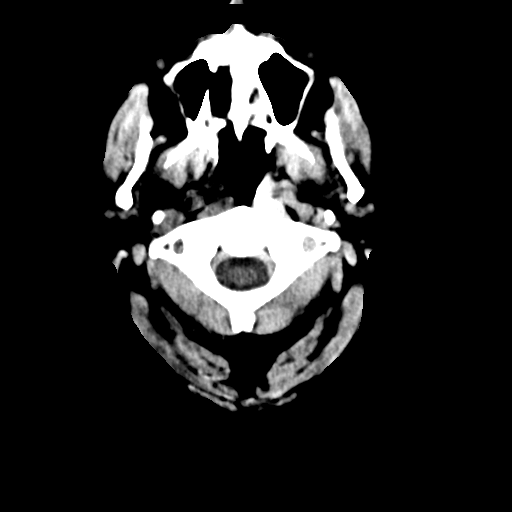
[im 1/34  bone]
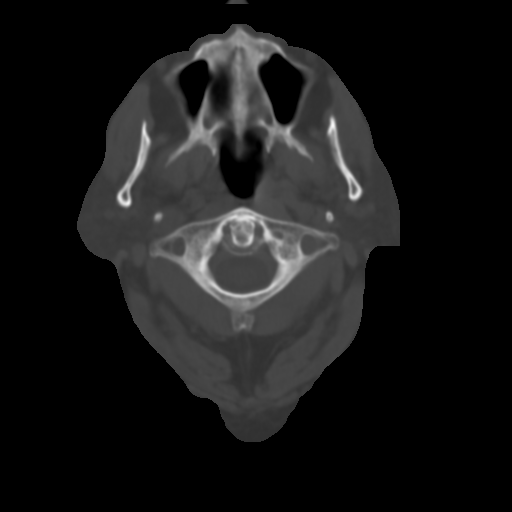
[im 17/34  brain]
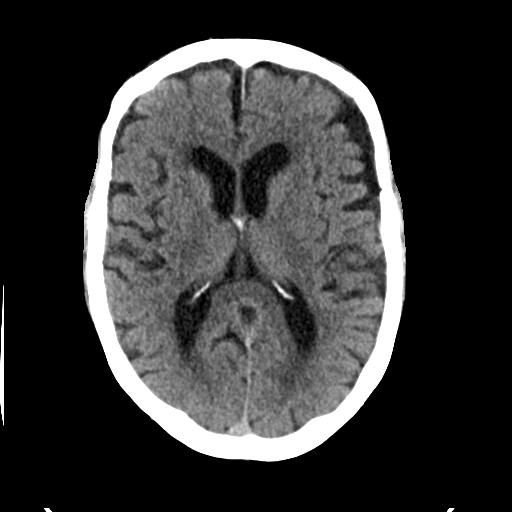
[im 34/34  brain]
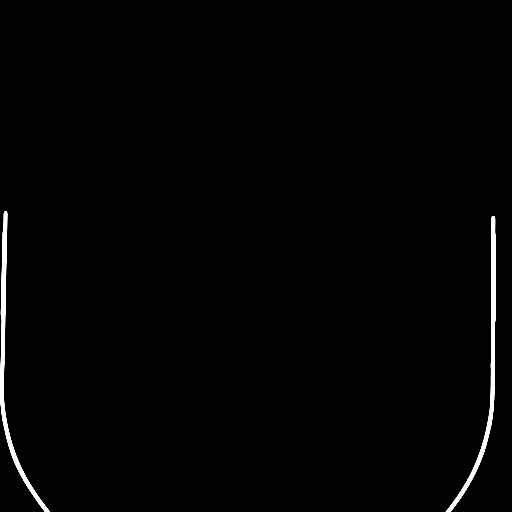

[Series 5: head bone · axial · 0.45mm/px · z∈[+1171,+1289]mm · 6 of 83 slices shown]
[im 12/83  bone]
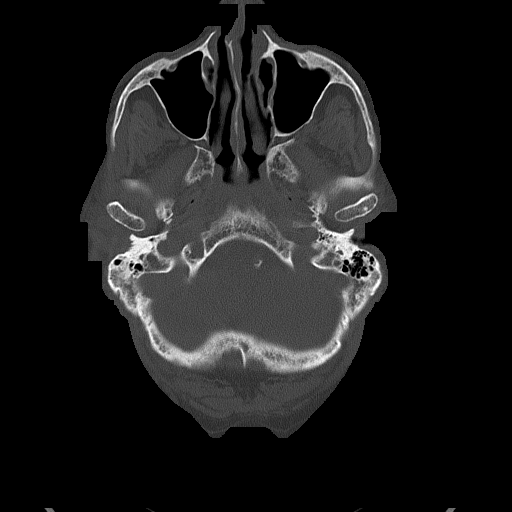
[im 24/83  bone]
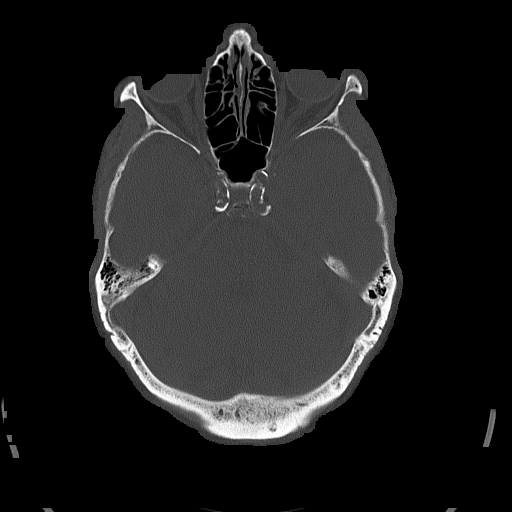
[im 36/83  bone]
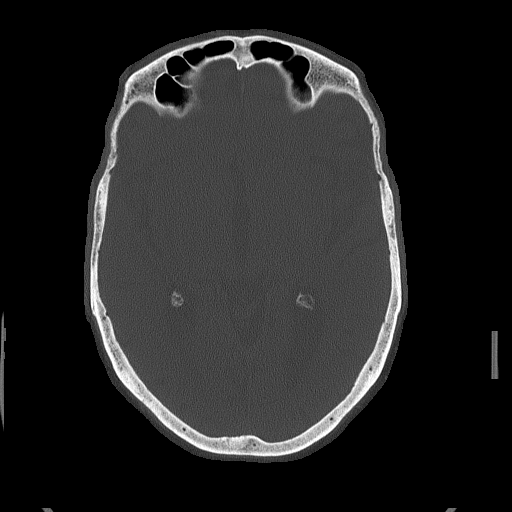
[im 47/83  bone]
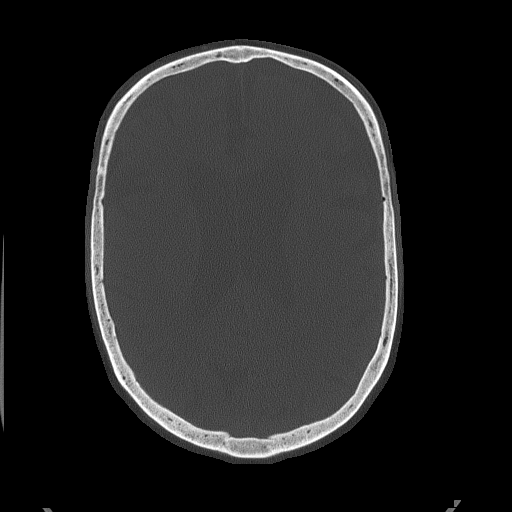
[im 59/83  bone]
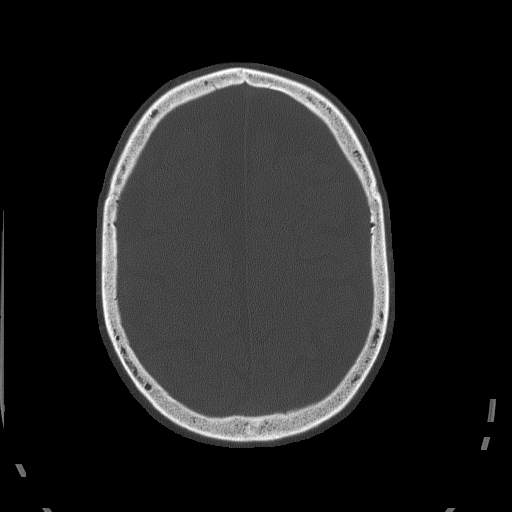
[im 71/83  bone]
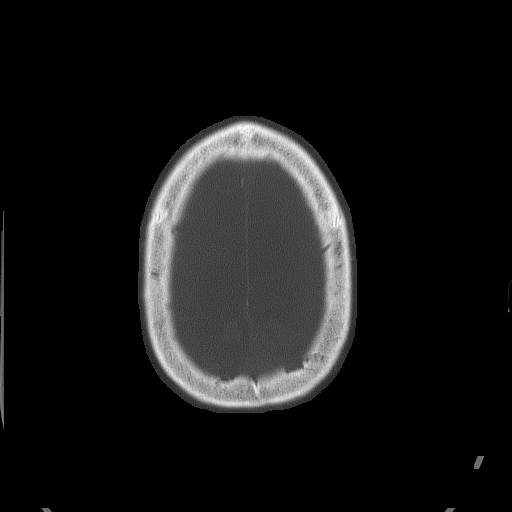

[Series 6: head without cor · coronal · non-contrast · 0.32mm/px · 3 of 68 slices shown]
[im 20/68  brain]
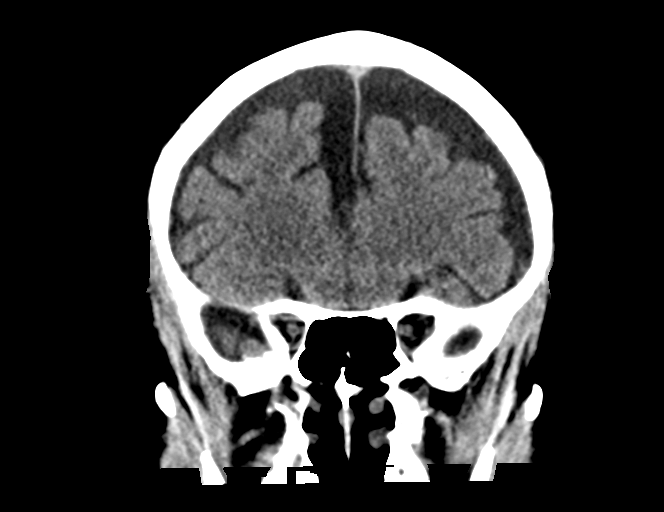
[im 29/68  brain]
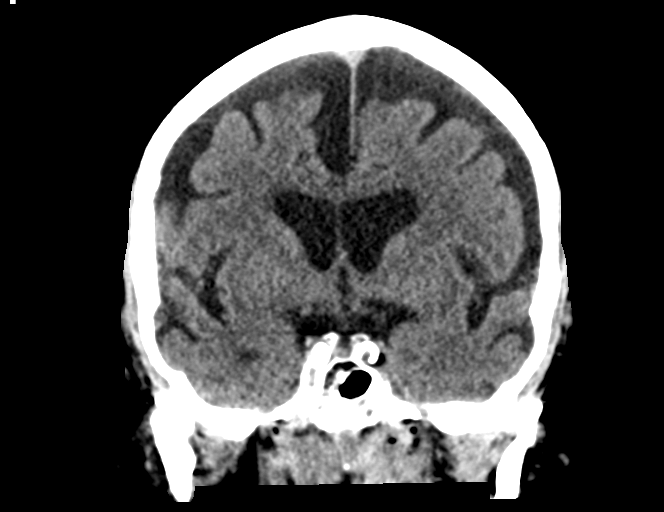
[im 39/68  brain]
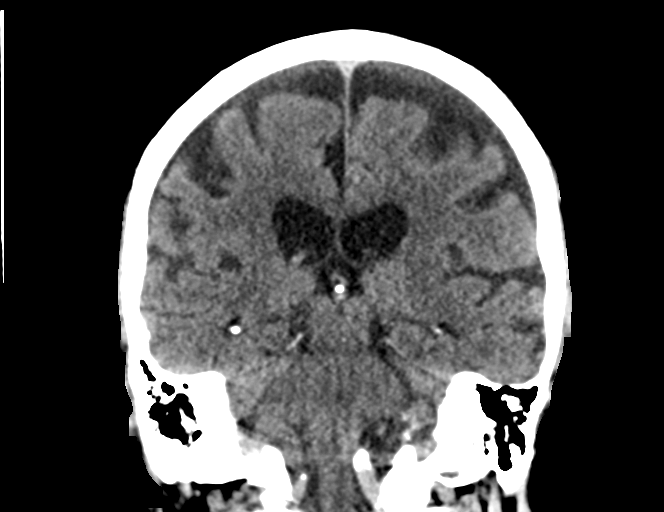

[Series 7: head without sag · sagittal · non-contrast · 0.32mm/px · 1 of 51 slices shown]
[im 26/51  brain]
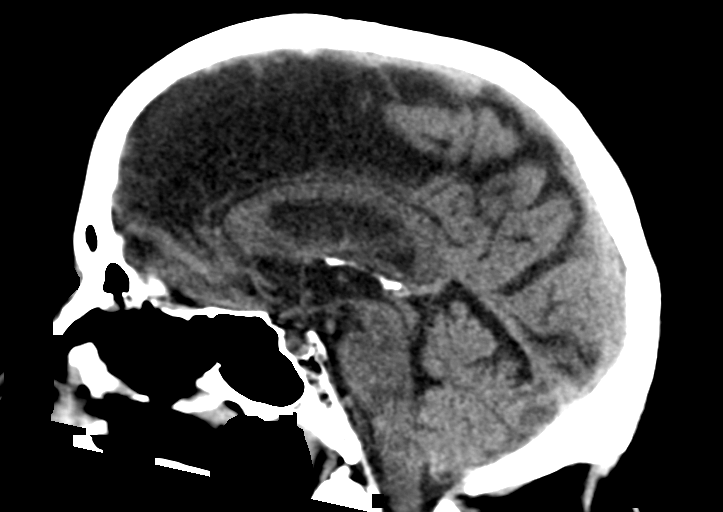

[Series 8: c_spine 2.0 st · axial · 0.36mm/px · z∈[+996,+1062]mm · 4 of 100 slices shown]
[im 12/100  brain]
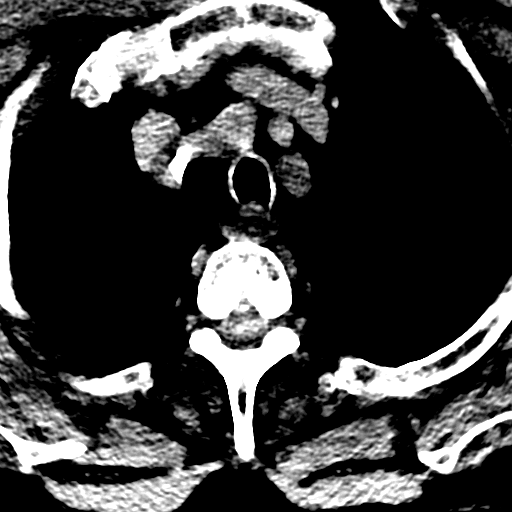
[im 23/100  brain]
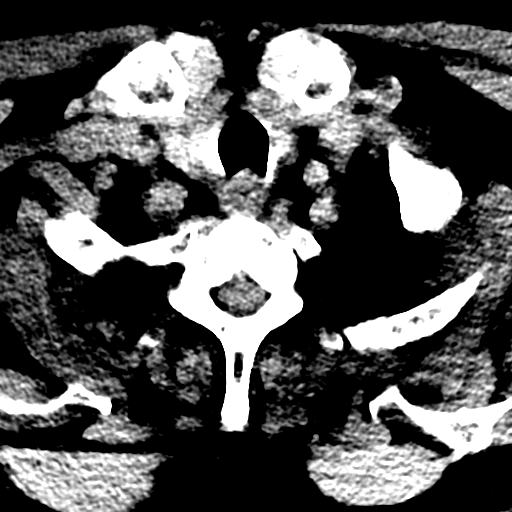
[im 34/100  brain]
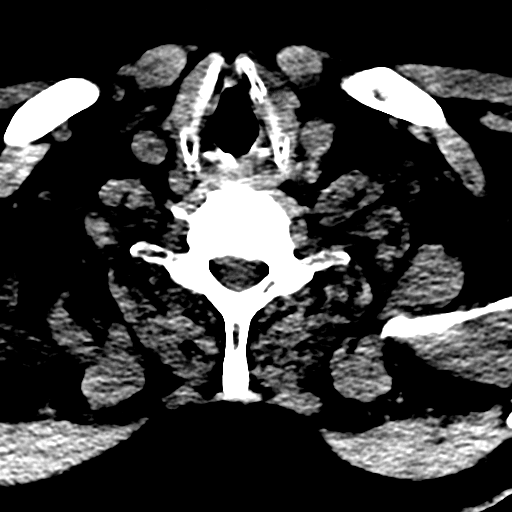
[im 45/100  brain]
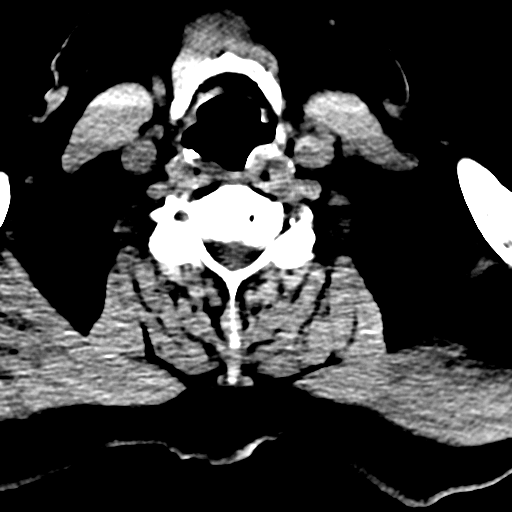

[17 of 47 positions shown; findings below may reference images not displayed]

FINDINGS: CT HEAD FINDINGS

Brain: There is atrophy and chronic small vessel disease changes. No
acute intracranial abnormality. Specifically, no hemorrhage,
hydrocephalus, mass lesion, acute infarction, or significant
intracranial injury.

Vascular: No hyperdense vessel or unexpected calcification.

Skull: No acute calvarial abnormality.

Sinuses/Orbits: Visualized paranasal sinuses and mastoids clear.
Orbital soft tissues unremarkable.

Other: None

CT CERVICAL SPINE FINDINGS

Alignment: Normal

Skull base and vertebrae: No fracture

Soft tissues and spinal canal: Prevertebral soft tissues are normal.
No epidural or paraspinal hematoma.

Disc levels: Diffuse degenerative disc and facet disease throughout
the cervical spine.

Upper chest: Negative

Other: Carotid artery calcifications again noted, unchanged.
IMPRESSION: No acute intracranial abnormality. Atrophy, chronic microvascular
disease.

No acute bony abnormality in the cervical spine. Degenerative disc
and facet disease.

## 2018-08-16 DIAGNOSIS — E871 Hypo-osmolality and hyponatremia: Secondary | ICD-10-CM | POA: Diagnosis not present

## 2018-08-29 DIAGNOSIS — J449 Chronic obstructive pulmonary disease, unspecified: Secondary | ICD-10-CM | POA: Diagnosis not present

## 2018-08-29 DIAGNOSIS — E871 Hypo-osmolality and hyponatremia: Secondary | ICD-10-CM | POA: Diagnosis not present

## 2018-08-29 DIAGNOSIS — R609 Edema, unspecified: Secondary | ICD-10-CM | POA: Diagnosis not present

## 2018-09-02 ENCOUNTER — Telehealth: Payer: Self-pay | Admitting: Emergency Medicine

## 2018-09-02 MED ORDER — PREDNISONE 10 MG PO TABS: ORAL_TABLET | ORAL | 0 refills | Status: DC

## 2018-09-02 NOTE — Telephone Encounter (Signed)
Primary Pulmonologist: RB Last office visit and with whom: 07/16/2018 with RB What do we see them for (pulmonary problems): Simple Chronic Bronchitis  Reason for call: Spoke with pt's son, Zeddicus (pt emergency contact). Jaquavis states pt was seen by PCP, Dr. Rex Kras, and labs were done, sent to [place]. Emran states he is unsure what the lab was; however, that pt tested positive for it (not COVID related). As of now, I do not see any electronic record of the labs. I have checked RB's incoming mail, did not locate any paperwork regarding this at this time.  Pt son also states he feels as though pt is having increased wheezing with exertion. Pt son reports pt is not very mobile and is in bed almost all the time d/t fear of falling. Denies pt having acute symptoms such as fever/chills/body aches. Denies cough, denies chest pain/tightness. Pt son reports pt is not currently on home O2 and is taking his breathing medications as directed. He further notes pt recently had some swelling and sodium imbalances; however, that it has been resolved.   In the last month, have you been in contact with someone who was confirmed or suspected to have Conoravirus / COVID-19?  No  Do you have any of the following symptoms developed in the last 30 days? Fever: No Cough: No Shortness of breath: Yes, usual DOE  When did your symptoms start?  Pt son reports increased wheezing onset starting about two weeks ago  If the patient has a fever, what is the last reading?  (use n/a if patient denies fever)  N/A . IF THE PATIENT STATES THEY DO NOT OWN A THERMOMETER, THEY MUST GO AND PURCHASE ONE When did the fever start?: N/A Have you taken any medication to suppress a fever (ie Ibuprofen, Aleve, Tylenol)?: N/A  Routing this message to RB for follow up, as he is in the office today. RB, please advise with your recommendations. Thank you.

## 2018-09-02 NOTE — Telephone Encounter (Signed)
LMTCB

## 2018-09-02 NOTE — Telephone Encounter (Signed)
Returned call to Kamyar Cammisa (son, emergency contact), he is driving and asked that I call back in 10 minutes. Will try back in a few.

## 2018-09-02 NOTE — Telephone Encounter (Signed)
I haven't seen any labwork. Will keep a watch for anything  If he is having increased wheeze, we can try a prednisone taper if the patient and his son agree to this. If so, then send:  Take 40mg  daily for 3 days, then 30mg  daily for 3 days, then 20mg  daily for 3 days, then 10mg  daily for 3 days, then stop  He also needs to continue his same BD as he is doing.

## 2018-09-02 NOTE — Telephone Encounter (Signed)
Patient son returned phone call, made aware of RB recommendations. Voiced understanding. Confirmed pharmacy. Prednisone sent in. Fax number given to be sure to have PCP fax most recent labs. Voiced understanding. Nothing further needed at this time.

## 2018-09-17 DIAGNOSIS — R609 Edema, unspecified: Secondary | ICD-10-CM | POA: Diagnosis not present

## 2018-09-17 DIAGNOSIS — J449 Chronic obstructive pulmonary disease, unspecified: Secondary | ICD-10-CM | POA: Diagnosis not present

## 2018-09-17 DIAGNOSIS — E871 Hypo-osmolality and hyponatremia: Secondary | ICD-10-CM | POA: Diagnosis not present

## 2018-09-26 DIAGNOSIS — J45909 Unspecified asthma, uncomplicated: Secondary | ICD-10-CM | POA: Diagnosis not present

## 2018-09-26 DIAGNOSIS — E1140 Type 2 diabetes mellitus with diabetic neuropathy, unspecified: Secondary | ICD-10-CM | POA: Diagnosis not present

## 2018-09-26 DIAGNOSIS — I25119 Atherosclerotic heart disease of native coronary artery with unspecified angina pectoris: Secondary | ICD-10-CM | POA: Diagnosis not present

## 2018-09-26 DIAGNOSIS — E039 Hypothyroidism, unspecified: Secondary | ICD-10-CM | POA: Diagnosis not present

## 2018-09-26 DIAGNOSIS — E1142 Type 2 diabetes mellitus with diabetic polyneuropathy: Secondary | ICD-10-CM | POA: Diagnosis not present

## 2018-09-26 DIAGNOSIS — E785 Hyperlipidemia, unspecified: Secondary | ICD-10-CM | POA: Diagnosis not present

## 2018-09-26 DIAGNOSIS — I10 Essential (primary) hypertension: Secondary | ICD-10-CM | POA: Diagnosis not present

## 2018-09-26 DIAGNOSIS — M170 Bilateral primary osteoarthritis of knee: Secondary | ICD-10-CM | POA: Diagnosis not present

## 2018-09-26 DIAGNOSIS — E1165 Type 2 diabetes mellitus with hyperglycemia: Secondary | ICD-10-CM | POA: Diagnosis not present

## 2018-09-26 DIAGNOSIS — J449 Chronic obstructive pulmonary disease, unspecified: Secondary | ICD-10-CM | POA: Diagnosis not present

## 2018-10-15 ENCOUNTER — Ambulatory Visit (INDEPENDENT_AMBULATORY_CARE_PROVIDER_SITE_OTHER): Payer: Medicare Other | Admitting: Podiatry

## 2018-10-15 ENCOUNTER — Other Ambulatory Visit: Payer: Self-pay

## 2018-10-15 ENCOUNTER — Encounter: Payer: Self-pay | Admitting: Podiatry

## 2018-10-15 DIAGNOSIS — B351 Tinea unguium: Secondary | ICD-10-CM

## 2018-10-15 DIAGNOSIS — W450XXA Nail entering through skin, initial encounter: Secondary | ICD-10-CM | POA: Diagnosis not present

## 2018-10-15 DIAGNOSIS — M79675 Pain in left toe(s): Secondary | ICD-10-CM | POA: Diagnosis not present

## 2018-10-15 DIAGNOSIS — M79674 Pain in right toe(s): Secondary | ICD-10-CM | POA: Diagnosis not present

## 2018-10-15 DIAGNOSIS — E1159 Type 2 diabetes mellitus with other circulatory complications: Secondary | ICD-10-CM | POA: Diagnosis not present

## 2018-10-15 DIAGNOSIS — E1142 Type 2 diabetes mellitus with diabetic polyneuropathy: Secondary | ICD-10-CM | POA: Diagnosis not present

## 2018-10-15 NOTE — Progress Notes (Signed)
Complaint:  Visit Type: Patient returns to my office for continued preventative foot care services. Complaint: Patient states" my nails have grown long and thick and become painful to walk and wear shoes" Patient has been diagnosed with DM with pvd.. The patient presents for preventative foot care services. No changes to ROS.  Patient says he injured his big toenail one week ago.  No treatment was afforded.  Podiatric Exam: Vascular: dorsalis pedis and posterior tibial pulses are not  palpable bilateral. Capillary return is immediate. Temperature gradient is WNL. Skin turgor WNL Venous stasis feet and legs. Sensorium: Diminished  Semmes Weinstein monofilament test. Normal tactile sensation bilaterally. Nail Exam: Pt has thick disfigured discolored nails with subungual debris noted bilateral entire nail hallux through fifth toenails.  Unattached nail plate right hallux. Ulcer Exam: There is no evidence of ulcer or pre-ulcerative changes or infection. Orthopedic Exam: Muscle tone and strength are WNL. No limitations in general ROM. No crepitus or effusions noted. HAV with hammer toe right foot. Skin: No Porokeratosis. No infection or ulcers  Diagnosis:  Onychomycosis, , Pain in right toe, pain in left toes  Hallux nail injury right hallux.  Treatment & Plan Procedures and Treatment: Consent by patient was obtained for treatment procedures.   Debridement of mycotic and hypertrophic toenails, 1 through 5 bilateral and clearing of subungual debris. No ulceration, no infection noted.  Remnant nail plate was resected.  Neosporin/DSD.  Told patient to peroxide wash right hallux until healed. Return Visit-Office Procedure: Patient instructed to return to the office for a follow up visit 3 months for continued evaluation and treatment.    Gardiner Barefoot DPM

## 2018-11-12 DIAGNOSIS — E1165 Type 2 diabetes mellitus with hyperglycemia: Secondary | ICD-10-CM | POA: Diagnosis not present

## 2018-11-12 DIAGNOSIS — I25119 Atherosclerotic heart disease of native coronary artery with unspecified angina pectoris: Secondary | ICD-10-CM | POA: Diagnosis not present

## 2018-11-12 DIAGNOSIS — J45909 Unspecified asthma, uncomplicated: Secondary | ICD-10-CM | POA: Diagnosis not present

## 2018-11-12 DIAGNOSIS — E1140 Type 2 diabetes mellitus with diabetic neuropathy, unspecified: Secondary | ICD-10-CM | POA: Diagnosis not present

## 2018-11-12 DIAGNOSIS — E039 Hypothyroidism, unspecified: Secondary | ICD-10-CM | POA: Diagnosis not present

## 2018-11-12 DIAGNOSIS — I10 Essential (primary) hypertension: Secondary | ICD-10-CM | POA: Diagnosis not present

## 2018-11-12 DIAGNOSIS — E785 Hyperlipidemia, unspecified: Secondary | ICD-10-CM | POA: Diagnosis not present

## 2018-11-12 DIAGNOSIS — J449 Chronic obstructive pulmonary disease, unspecified: Secondary | ICD-10-CM | POA: Diagnosis not present

## 2018-11-12 DIAGNOSIS — E1142 Type 2 diabetes mellitus with diabetic polyneuropathy: Secondary | ICD-10-CM | POA: Diagnosis not present

## 2018-11-12 DIAGNOSIS — M170 Bilateral primary osteoarthritis of knee: Secondary | ICD-10-CM | POA: Diagnosis not present

## 2018-11-26 DIAGNOSIS — J449 Chronic obstructive pulmonary disease, unspecified: Secondary | ICD-10-CM | POA: Diagnosis not present

## 2018-11-26 DIAGNOSIS — E871 Hypo-osmolality and hyponatremia: Secondary | ICD-10-CM | POA: Diagnosis not present

## 2018-11-26 DIAGNOSIS — R609 Edema, unspecified: Secondary | ICD-10-CM | POA: Diagnosis not present

## 2019-01-07 DIAGNOSIS — E039 Hypothyroidism, unspecified: Secondary | ICD-10-CM | POA: Diagnosis not present

## 2019-01-07 DIAGNOSIS — M170 Bilateral primary osteoarthritis of knee: Secondary | ICD-10-CM | POA: Diagnosis not present

## 2019-01-07 DIAGNOSIS — I10 Essential (primary) hypertension: Secondary | ICD-10-CM | POA: Diagnosis not present

## 2019-01-07 DIAGNOSIS — E785 Hyperlipidemia, unspecified: Secondary | ICD-10-CM | POA: Diagnosis not present

## 2019-01-07 DIAGNOSIS — E1165 Type 2 diabetes mellitus with hyperglycemia: Secondary | ICD-10-CM | POA: Diagnosis not present

## 2019-01-07 DIAGNOSIS — J45909 Unspecified asthma, uncomplicated: Secondary | ICD-10-CM | POA: Diagnosis not present

## 2019-01-07 DIAGNOSIS — E1140 Type 2 diabetes mellitus with diabetic neuropathy, unspecified: Secondary | ICD-10-CM | POA: Diagnosis not present

## 2019-01-07 DIAGNOSIS — J449 Chronic obstructive pulmonary disease, unspecified: Secondary | ICD-10-CM | POA: Diagnosis not present

## 2019-01-07 DIAGNOSIS — E1142 Type 2 diabetes mellitus with diabetic polyneuropathy: Secondary | ICD-10-CM | POA: Diagnosis not present

## 2019-01-07 DIAGNOSIS — I25119 Atherosclerotic heart disease of native coronary artery with unspecified angina pectoris: Secondary | ICD-10-CM | POA: Diagnosis not present

## 2019-01-14 ENCOUNTER — Other Ambulatory Visit: Payer: Self-pay

## 2019-01-14 ENCOUNTER — Encounter: Payer: Self-pay | Admitting: Podiatry

## 2019-01-14 ENCOUNTER — Ambulatory Visit (INDEPENDENT_AMBULATORY_CARE_PROVIDER_SITE_OTHER): Payer: Medicare Other | Admitting: Podiatry

## 2019-01-14 DIAGNOSIS — E1142 Type 2 diabetes mellitus with diabetic polyneuropathy: Secondary | ICD-10-CM | POA: Diagnosis not present

## 2019-01-14 DIAGNOSIS — M79675 Pain in left toe(s): Secondary | ICD-10-CM

## 2019-01-14 DIAGNOSIS — M79674 Pain in right toe(s): Secondary | ICD-10-CM

## 2019-01-14 DIAGNOSIS — Z7984 Long term (current) use of oral hypoglycemic drugs: Secondary | ICD-10-CM | POA: Diagnosis not present

## 2019-01-14 DIAGNOSIS — E1159 Type 2 diabetes mellitus with other circulatory complications: Secondary | ICD-10-CM | POA: Diagnosis not present

## 2019-01-14 DIAGNOSIS — E871 Hypo-osmolality and hyponatremia: Secondary | ICD-10-CM | POA: Diagnosis not present

## 2019-01-14 DIAGNOSIS — D649 Anemia, unspecified: Secondary | ICD-10-CM | POA: Diagnosis not present

## 2019-01-14 DIAGNOSIS — S41101D Unspecified open wound of right upper arm, subsequent encounter: Secondary | ICD-10-CM | POA: Diagnosis not present

## 2019-01-14 DIAGNOSIS — E039 Hypothyroidism, unspecified: Secondary | ICD-10-CM | POA: Diagnosis not present

## 2019-01-14 DIAGNOSIS — I10 Essential (primary) hypertension: Secondary | ICD-10-CM | POA: Diagnosis not present

## 2019-01-14 DIAGNOSIS — B351 Tinea unguium: Secondary | ICD-10-CM | POA: Diagnosis not present

## 2019-01-14 DIAGNOSIS — M109 Gout, unspecified: Secondary | ICD-10-CM | POA: Diagnosis not present

## 2019-01-14 DIAGNOSIS — E1140 Type 2 diabetes mellitus with diabetic neuropathy, unspecified: Secondary | ICD-10-CM | POA: Diagnosis not present

## 2019-01-14 NOTE — Progress Notes (Signed)
Complaint:  Visit Type: Patient returns to my office for continued preventative foot care services. Complaint: Patient states" my nails have grown long and thick and become painful to walk and wear shoes" Patient has been diagnosed with DM with pvd.. The patient presents for preventative foot care services. No changes to ROS.    Podiatric Exam: Vascular: dorsalis pedis and posterior tibial pulses are not  palpable bilateral. Capillary return is immediate. Temperature gradient is WNL. Skin turgor WNL Venous stasis feet and legs. Sensorium: Diminished  Semmes Weinstein monofilament test. Normal tactile sensation bilaterally. Nail Exam: Pt has thick disfigured discolored nails with subungual debris noted bilateral entire nail hallux through fifth toenails.  Unattached nail plate right hallux. Ulcer Exam: There is no evidence of ulcer or pre-ulcerative changes or infection. Orthopedic Exam: Muscle tone and strength are WNL. No limitations in general ROM. No crepitus or effusions noted. HAV with hammer toe right foot. Skin: No Porokeratosis. No infection or ulcers  Diagnosis:  Onychomycosis, , Pain in right toe, pain in left toes    Treatment & Plan Procedures and Treatment: Consent by patient was obtained for treatment procedures.   Debridement of mycotic and hypertrophic toenails, 1 through 5 bilateral and clearing of subungual debris. No ulceration, no infection noted.   Return Visit-Office Procedure: Patient instructed to return to the office for a follow up visit 3 months for continued evaluation and treatment.    Gardiner Barefoot DPM

## 2019-01-31 ENCOUNTER — Ambulatory Visit: Payer: Medicare Other

## 2019-02-11 ENCOUNTER — Ambulatory Visit: Payer: Medicare Other

## 2019-02-16 ENCOUNTER — Ambulatory Visit: Payer: Medicare Other

## 2019-03-02 ENCOUNTER — Ambulatory Visit: Payer: Medicare Other | Attending: Internal Medicine

## 2019-03-02 DIAGNOSIS — Z23 Encounter for immunization: Secondary | ICD-10-CM | POA: Insufficient documentation

## 2019-03-02 NOTE — Progress Notes (Signed)
   [id number]  Name:  Ernest Clark.    MRN: [id number] DOB: January 03, 1931  03/02/2019  Ernest Clark was observed post Covid-19 immunization for 15 minutes without incidence. He was provided with Vaccine Information Sheet and instruction to access the V-Safe system.   Ernest Clark was instructed to call 911 with any severe reactions post vaccine: Marland Kitchen Difficulty breathing  . Swelling of your face and throat  . A fast heartbeat  . A bad rash all over your body  . Dizziness and weakness    Immunizations Administered    Name Date Dose VIS Date Route   Pfizer COVID-19 Vaccine 03/02/2019  1:23 PM 0.3 mL 12/13/2018 Intramuscular   Manufacturer: [place]   Lot: [id number]   [place]: [id number]

## 2019-03-11 DIAGNOSIS — E063 Autoimmune thyroiditis: Secondary | ICD-10-CM | POA: Diagnosis not present

## 2019-03-11 DIAGNOSIS — I739 Peripheral vascular disease, unspecified: Secondary | ICD-10-CM | POA: Diagnosis not present

## 2019-03-11 DIAGNOSIS — E039 Hypothyroidism, unspecified: Secondary | ICD-10-CM | POA: Diagnosis not present

## 2019-03-11 DIAGNOSIS — E1142 Type 2 diabetes mellitus with diabetic polyneuropathy: Secondary | ICD-10-CM | POA: Diagnosis not present

## 2019-03-11 DIAGNOSIS — Z794 Long term (current) use of insulin: Secondary | ICD-10-CM | POA: Diagnosis not present

## 2019-04-02 ENCOUNTER — Ambulatory Visit: Payer: Medicare Other | Attending: Internal Medicine

## 2019-04-02 DIAGNOSIS — Z23 Encounter for immunization: Secondary | ICD-10-CM

## 2019-04-02 NOTE — Progress Notes (Signed)
   [id number]  Name:  Giulian Assi.    MRN: [id number] DOB: Nov 18, 1931  04/02/2019  Mr. Ricketson was observed post Covid-19 immunization for 15 minutes without incident. He was provided with Vaccine Information Sheet and instruction to access the V-Safe system.   Mr. Laprise was instructed to call 911 with any severe reactions post vaccine: Marland Kitchen Difficulty breathing  . Swelling of face and throat  . A fast heartbeat  . A bad rash all over body  . Dizziness and weakness   Immunizations Administered    Name Date Dose VIS Date Route   Pfizer COVID-19 Vaccine 04/02/2019  2:36 PM 0.3 mL 12/13/2018 Intramuscular   Manufacturer: [employer]   Lot: [id number]   [place]: [id number]

## 2019-04-15 ENCOUNTER — Other Ambulatory Visit: Payer: Self-pay

## 2019-04-15 ENCOUNTER — Ambulatory Visit (INDEPENDENT_AMBULATORY_CARE_PROVIDER_SITE_OTHER): Payer: Medicare Other | Admitting: Podiatry

## 2019-04-15 VITALS — Temp 98.1°F

## 2019-04-15 DIAGNOSIS — E1142 Type 2 diabetes mellitus with diabetic polyneuropathy: Secondary | ICD-10-CM

## 2019-04-15 DIAGNOSIS — B351 Tinea unguium: Secondary | ICD-10-CM

## 2019-04-15 DIAGNOSIS — M79675 Pain in left toe(s): Secondary | ICD-10-CM

## 2019-04-15 DIAGNOSIS — M79674 Pain in right toe(s): Secondary | ICD-10-CM

## 2019-04-15 DIAGNOSIS — M2012 Hallux valgus (acquired), left foot: Secondary | ICD-10-CM

## 2019-04-15 DIAGNOSIS — M2011 Hallux valgus (acquired), right foot: Secondary | ICD-10-CM | POA: Insufficient documentation

## 2019-04-15 DIAGNOSIS — I739 Peripheral vascular disease, unspecified: Secondary | ICD-10-CM

## 2019-04-15 DIAGNOSIS — E1159 Type 2 diabetes mellitus with other circulatory complications: Secondary | ICD-10-CM

## 2019-04-15 NOTE — Progress Notes (Signed)
This patient returns to my office for at risk foot care.  This patient requires this care by a professional since this patient will be at risk due to having peripheral arterial disease and type 2 diabetes. Patient is accompanied by male caregiver. This patient is unable to cut nails himself since the patient cannot reach his nails.These nails are painful walking and wearing shoes.  This patient presents for at risk foot care today.  General Appearance  Alert, conversant and in no acute stress.  Vascular  Dorsalis pedis pulses  are weakly  palpable  bilaterally.   Posterior tibial pulses are absent  B/L. Capillary return is within normal limits  bilaterally. Temperature is within normal limits  bilaterally.  Neurologic  Senn-Weinstein monofilament wire test diminished   bilaterally. Muscle power within normal limits bilaterally.  Nails Thick disfigured discolored nails with subungual debris  from hallux to fifth toes bilaterally. No evidence of bacterial infection or drainage bilaterally.  Orthopedic  No limitations of motion  feet .  No crepitus or effusions noted.  No bony pathology or digital deformities noted.  HAV  B/L.    Skin  normotropic skin with no porokeratosis noted bilaterally.  No signs of infections or ulcers noted.     Onychomycosis  Pain in right toes  Pain in left toes  Consent was obtained for treatment procedures.   Mechanical debridement of nails 1-5  bilaterally performed with a nail nipper.  Filed with dremel without incident.    Return office visit   3 months                   Told patient to return for periodic foot care and evaluation due to potential at risk complications.   Gardiner Barefoot DPM

## 2019-07-03 DIAGNOSIS — E1165 Type 2 diabetes mellitus with hyperglycemia: Secondary | ICD-10-CM | POA: Diagnosis not present

## 2019-07-03 DIAGNOSIS — E1142 Type 2 diabetes mellitus with diabetic polyneuropathy: Secondary | ICD-10-CM | POA: Diagnosis not present

## 2019-07-03 DIAGNOSIS — I10 Essential (primary) hypertension: Secondary | ICD-10-CM | POA: Diagnosis not present

## 2019-07-03 DIAGNOSIS — E785 Hyperlipidemia, unspecified: Secondary | ICD-10-CM | POA: Diagnosis not present

## 2019-07-03 DIAGNOSIS — J45909 Unspecified asthma, uncomplicated: Secondary | ICD-10-CM | POA: Diagnosis not present

## 2019-07-03 DIAGNOSIS — E1140 Type 2 diabetes mellitus with diabetic neuropathy, unspecified: Secondary | ICD-10-CM | POA: Diagnosis not present

## 2019-07-03 DIAGNOSIS — I25119 Atherosclerotic heart disease of native coronary artery with unspecified angina pectoris: Secondary | ICD-10-CM | POA: Diagnosis not present

## 2019-07-03 DIAGNOSIS — J449 Chronic obstructive pulmonary disease, unspecified: Secondary | ICD-10-CM | POA: Diagnosis not present

## 2019-07-03 DIAGNOSIS — M170 Bilateral primary osteoarthritis of knee: Secondary | ICD-10-CM | POA: Diagnosis not present

## 2019-07-03 DIAGNOSIS — E039 Hypothyroidism, unspecified: Secondary | ICD-10-CM | POA: Diagnosis not present

## 2019-07-11 ENCOUNTER — Other Ambulatory Visit: Payer: Self-pay | Admitting: Emergency Medicine

## 2019-07-11 DIAGNOSIS — Z794 Long term (current) use of insulin: Secondary | ICD-10-CM | POA: Diagnosis not present

## 2019-07-11 DIAGNOSIS — E039 Hypothyroidism, unspecified: Secondary | ICD-10-CM | POA: Diagnosis not present

## 2019-07-11 DIAGNOSIS — E1142 Type 2 diabetes mellitus with diabetic polyneuropathy: Secondary | ICD-10-CM | POA: Diagnosis not present

## 2019-07-11 DIAGNOSIS — E1165 Type 2 diabetes mellitus with hyperglycemia: Secondary | ICD-10-CM | POA: Diagnosis not present

## 2019-07-11 DIAGNOSIS — I739 Peripheral vascular disease, unspecified: Secondary | ICD-10-CM | POA: Diagnosis not present

## 2019-07-11 DIAGNOSIS — B356 Tinea cruris: Secondary | ICD-10-CM | POA: Diagnosis not present

## 2019-07-11 DIAGNOSIS — E063 Autoimmune thyroiditis: Secondary | ICD-10-CM | POA: Diagnosis not present

## 2019-07-13 ENCOUNTER — Inpatient Hospital Stay (HOSPITAL_BASED_OUTPATIENT_CLINIC_OR_DEPARTMENT_OTHER)
Admission: EM | Admit: 2019-07-13 | Discharge: 2019-07-22 | DRG: 189 | Disposition: A | Discharge status: Skilled Nursing Facility | Payer: Medicare Other | Attending: Internal Medicine | Admitting: Internal Medicine

## 2019-07-13 ENCOUNTER — Encounter (HOSPITAL_BASED_OUTPATIENT_CLINIC_OR_DEPARTMENT_OTHER): Payer: Self-pay | Admitting: *Deleted

## 2019-07-13 ENCOUNTER — Emergency Department (HOSPITAL_COMMUNITY)
Admission: EM | Admit: 2019-07-13 | Discharge: 2019-07-13 | Disposition: A | Discharge status: Home / Self Care | Payer: Medicare Other | Source: Home / Self Care

## 2019-07-13 ENCOUNTER — Other Ambulatory Visit: Payer: Self-pay

## 2019-07-13 ENCOUNTER — Encounter (HOSPITAL_COMMUNITY): Payer: Self-pay | Admitting: Emergency Medicine

## 2019-07-13 ENCOUNTER — Emergency Department (HOSPITAL_BASED_OUTPATIENT_CLINIC_OR_DEPARTMENT_OTHER): Payer: Medicare Other

## 2019-07-13 DIAGNOSIS — R627 Adult failure to thrive: Secondary | ICD-10-CM | POA: Diagnosis present

## 2019-07-13 DIAGNOSIS — Z87891 Personal history of nicotine dependence: Secondary | ICD-10-CM

## 2019-07-13 DIAGNOSIS — Z79899 Other long term (current) drug therapy: Secondary | ICD-10-CM

## 2019-07-13 DIAGNOSIS — J439 Emphysema, unspecified: Secondary | ICD-10-CM | POA: Diagnosis not present

## 2019-07-13 DIAGNOSIS — R5381 Other malaise: Secondary | ICD-10-CM | POA: Diagnosis present

## 2019-07-13 DIAGNOSIS — H9190 Unspecified hearing loss, unspecified ear: Secondary | ICD-10-CM | POA: Diagnosis present

## 2019-07-13 DIAGNOSIS — E1151 Type 2 diabetes mellitus with diabetic peripheral angiopathy without gangrene: Secondary | ICD-10-CM | POA: Diagnosis not present

## 2019-07-13 DIAGNOSIS — R0902 Hypoxemia: Secondary | ICD-10-CM | POA: Diagnosis not present

## 2019-07-13 DIAGNOSIS — Z79890 Hormone replacement therapy (postmenopausal): Secondary | ICD-10-CM | POA: Diagnosis not present

## 2019-07-13 DIAGNOSIS — Z6837 Body mass index (BMI) 37.0-37.9, adult: Secondary | ICD-10-CM

## 2019-07-13 DIAGNOSIS — E039 Hypothyroidism, unspecified: Secondary | ICD-10-CM | POA: Diagnosis not present

## 2019-07-13 DIAGNOSIS — E785 Hyperlipidemia, unspecified: Secondary | ICD-10-CM | POA: Diagnosis not present

## 2019-07-13 DIAGNOSIS — I441 Atrioventricular block, second degree: Secondary | ICD-10-CM | POA: Diagnosis present

## 2019-07-13 DIAGNOSIS — W06XXXA Fall from bed, initial encounter: Secondary | ICD-10-CM | POA: Diagnosis present

## 2019-07-13 DIAGNOSIS — I10 Essential (primary) hypertension: Secondary | ICD-10-CM | POA: Diagnosis not present

## 2019-07-13 DIAGNOSIS — Z5321 Procedure and treatment not carried out due to patient leaving prior to being seen by health care provider: Secondary | ICD-10-CM | POA: Insufficient documentation

## 2019-07-13 DIAGNOSIS — Z20822 Contact with and (suspected) exposure to covid-19: Secondary | ICD-10-CM | POA: Diagnosis not present

## 2019-07-13 DIAGNOSIS — R778 Other specified abnormalities of plasma proteins: Secondary | ICD-10-CM

## 2019-07-13 DIAGNOSIS — I2510 Atherosclerotic heart disease of native coronary artery without angina pectoris: Secondary | ICD-10-CM | POA: Diagnosis present

## 2019-07-13 DIAGNOSIS — J9601 Acute respiratory failure with hypoxia: Principal | ICD-10-CM | POA: Diagnosis present

## 2019-07-13 DIAGNOSIS — R69 Illness, unspecified: Secondary | ICD-10-CM | POA: Diagnosis not present

## 2019-07-13 DIAGNOSIS — Y999 Unspecified external cause status: Secondary | ICD-10-CM | POA: Insufficient documentation

## 2019-07-13 DIAGNOSIS — S51811A Laceration without foreign body of right forearm, initial encounter: Secondary | ICD-10-CM | POA: Insufficient documentation

## 2019-07-13 DIAGNOSIS — R58 Hemorrhage, not elsewhere classified: Secondary | ICD-10-CM | POA: Diagnosis not present

## 2019-07-13 DIAGNOSIS — Z955 Presence of coronary angioplasty implant and graft: Secondary | ICD-10-CM

## 2019-07-13 DIAGNOSIS — K219 Gastro-esophageal reflux disease without esophagitis: Secondary | ICD-10-CM | POA: Diagnosis present

## 2019-07-13 DIAGNOSIS — J449 Chronic obstructive pulmonary disease, unspecified: Secondary | ICD-10-CM | POA: Diagnosis not present

## 2019-07-13 DIAGNOSIS — J441 Chronic obstructive pulmonary disease with (acute) exacerbation: Secondary | ICD-10-CM | POA: Diagnosis not present

## 2019-07-13 DIAGNOSIS — R4182 Altered mental status, unspecified: Secondary | ICD-10-CM | POA: Diagnosis not present

## 2019-07-13 DIAGNOSIS — E872 Acidosis: Secondary | ICD-10-CM | POA: Diagnosis not present

## 2019-07-13 DIAGNOSIS — J90 Pleural effusion, not elsewhere classified: Secondary | ICD-10-CM | POA: Diagnosis present

## 2019-07-13 DIAGNOSIS — E875 Hyperkalemia: Secondary | ICD-10-CM | POA: Diagnosis present

## 2019-07-13 DIAGNOSIS — E119 Type 2 diabetes mellitus without complications: Secondary | ICD-10-CM

## 2019-07-13 DIAGNOSIS — Z86718 Personal history of other venous thrombosis and embolism: Secondary | ICD-10-CM

## 2019-07-13 DIAGNOSIS — Z66 Do not resuscitate: Secondary | ICD-10-CM | POA: Diagnosis present

## 2019-07-13 DIAGNOSIS — M109 Gout, unspecified: Secondary | ICD-10-CM | POA: Diagnosis present

## 2019-07-13 DIAGNOSIS — W19XXXA Unspecified fall, initial encounter: Secondary | ICD-10-CM | POA: Diagnosis not present

## 2019-07-13 DIAGNOSIS — E669 Obesity, unspecified: Secondary | ICD-10-CM | POA: Diagnosis present

## 2019-07-13 DIAGNOSIS — R0602 Shortness of breath: Secondary | ICD-10-CM

## 2019-07-13 DIAGNOSIS — Z794 Long term (current) use of insulin: Secondary | ICD-10-CM

## 2019-07-13 DIAGNOSIS — Y939 Activity, unspecified: Secondary | ICD-10-CM | POA: Insufficient documentation

## 2019-07-13 DIAGNOSIS — Z7982 Long term (current) use of aspirin: Secondary | ICD-10-CM | POA: Diagnosis not present

## 2019-07-13 DIAGNOSIS — Z7952 Long term (current) use of systemic steroids: Secondary | ICD-10-CM | POA: Diagnosis not present

## 2019-07-13 DIAGNOSIS — Y92003 Bedroom of unspecified non-institutional (private) residence as the place of occurrence of the external cause: Secondary | ICD-10-CM | POA: Insufficient documentation

## 2019-07-13 DIAGNOSIS — R54 Age-related physical debility: Secondary | ICD-10-CM | POA: Diagnosis not present

## 2019-07-13 DIAGNOSIS — E1165 Type 2 diabetes mellitus with hyperglycemia: Secondary | ICD-10-CM | POA: Diagnosis present

## 2019-07-13 DIAGNOSIS — E871 Hypo-osmolality and hyponatremia: Secondary | ICD-10-CM | POA: Diagnosis not present

## 2019-07-13 DIAGNOSIS — S51801A Unspecified open wound of right forearm, initial encounter: Secondary | ICD-10-CM | POA: Diagnosis not present

## 2019-07-13 DIAGNOSIS — Z87448 Personal history of other diseases of urinary system: Secondary | ICD-10-CM | POA: Diagnosis not present

## 2019-07-13 DIAGNOSIS — Z515 Encounter for palliative care: Secondary | ICD-10-CM

## 2019-07-13 LAB — COMPREHENSIVE METABOLIC PANEL
ALT: 21 U/L (ref 0–44)
AST: 41 U/L (ref 15–41)
Albumin: 3.4 g/dL — ABNORMAL LOW (ref 3.5–5.0)
Alkaline Phosphatase: 90 U/L (ref 38–126)
Anion gap: 7 (ref 5–15)
BUN: 16 mg/dL (ref 8–23)
CO2: 28 mmol/L (ref 22–32)
Calcium: 8.4 mg/dL — ABNORMAL LOW (ref 8.9–10.3)
Chloride: 93 mmol/L — ABNORMAL LOW (ref 98–111)
Creatinine, Ser: 0.74 mg/dL (ref 0.61–1.24)
GFR calc Af Amer: >60 mL/min (ref >60)
GFR calc non Af Amer: >60 mL/min (ref >60)
Glucose, Bld: 129 mg/dL — ABNORMAL HIGH (ref 70–99)
Potassium: 4.3 mmol/L (ref 3.5–5.1)
Sodium: 128 mmol/L — ABNORMAL LOW (ref 135–145)
Total Bilirubin: 1.1 mg/dL (ref 0.3–1.2)
Total Protein: 6.5 g/dL (ref 6.5–8.1)

## 2019-07-13 LAB — URINALYSIS, ROUTINE W REFLEX MICROSCOPIC
Bilirubin Urine: NEGATIVE
Glucose, UA: NEGATIVE mg/dL
Ketones, ur: NEGATIVE mg/dL
Leukocytes,Ua: NEGATIVE
Nitrite: NEGATIVE
Protein, ur: NEGATIVE mg/dL
Specific Gravity, Urine: 1.02 (ref 1.005–1.030)
pH: 6 (ref 5.0–8.0)

## 2019-07-13 LAB — CBC
HCT: 39.8 % (ref 39.0–52.0)
Hemoglobin: 12.8 g/dL — ABNORMAL LOW (ref 13.0–17.0)
MCH: 28.6 pg (ref 26.0–34.0)
MCHC: 32.2 g/dL (ref 30.0–36.0)
MCV: 89 fL (ref 80.0–100.0)
Platelets: 244 10*3/uL (ref 150–400)
RBC: 4.47 MIL/uL (ref 4.22–5.81)
RDW: 14.8 % (ref 11.5–15.5)
WBC: 11 10*3/uL — ABNORMAL HIGH (ref 4.0–10.5)
nRBC: 0 % (ref 0.0–0.2)

## 2019-07-13 LAB — URINALYSIS, MICROSCOPIC (REFLEX)

## 2019-07-13 LAB — PROTIME-INR
INR: 1 (ref 0.8–1.2)
Prothrombin Time: 13 seconds (ref 11.4–15.2)

## 2019-07-13 LAB — TROPONIN I (HIGH SENSITIVITY)
Troponin I (High Sensitivity): 31 ng/L — ABNORMAL HIGH (ref <18)
Troponin I (High Sensitivity): 26 ng/L — ABNORMAL HIGH (ref <18)

## 2019-07-13 LAB — LACTIC ACID, PLASMA: Lactic Acid, Venous: 1.1 mmol/L (ref 0.5–1.9)

## 2019-07-13 LAB — MAGNESIUM: Magnesium: 2 mg/dL (ref 1.7–2.4)

## 2019-07-13 LAB — SARS CORONAVIRUS 2 BY RT PCR (HOSPITAL ORDER, PERFORMED IN ~~LOC~~ HOSPITAL LAB): SARS Coronavirus 2: NEGATIVE

## 2019-07-13 MED ORDER — ALBUTEROL SULFATE (2.5 MG/3ML) 0.083% IN NEBU
2.5000 mg | INHALATION_SOLUTION | Freq: Once | RESPIRATORY_TRACT | Status: AC
  Administered 2019-07-13: 2.5 mg via RESPIRATORY_TRACT
  Filled 2019-07-13: qty 3

## 2019-07-13 MED ORDER — SODIUM CHLORIDE 0.9 % IV BOLUS: 1000.0000 mL | Freq: Once | INTRAVENOUS | Status: DC

## 2019-07-13 MED ORDER — LIDOCAINE HCL 1 % IJ SOLN
INTRAMUSCULAR | Status: AC
  Filled 2019-07-13: qty 20

## 2019-07-13 MED ORDER — ALBUTEROL SULFATE (2.5 MG/3ML) 0.083% IN NEBU
5.0000 mg | INHALATION_SOLUTION | Freq: Once | RESPIRATORY_TRACT | Status: AC
  Administered 2019-07-13: 5 mg via RESPIRATORY_TRACT
  Filled 2019-07-13: qty 6

## 2019-07-13 MED ORDER — METHYLPREDNISOLONE SODIUM SUCC 125 MG IJ SOLR
125.0000 mg | Freq: Once | INTRAMUSCULAR | Status: AC
  Administered 2019-07-13: 125 mg via INTRAVENOUS
  Filled 2019-07-13: qty 2

## 2019-07-13 MED ORDER — HYDROCODONE-ACETAMINOPHEN 5-325 MG PO TABS
1.0000 | ORAL_TABLET | Freq: Once | ORAL | Status: AC
  Administered 2019-07-13: 1 via ORAL
  Filled 2019-07-13: qty 1

## 2019-07-13 MED ORDER — SODIUM CHLORIDE 0.9 % IV BOLUS
500.0000 mL | Freq: Once | INTRAVENOUS | Status: AC
  Administered 2019-07-13: 500 mL via INTRAVENOUS

## 2019-07-13 MED ORDER — IPRATROPIUM-ALBUTEROL 0.5-2.5 (3) MG/3ML IN SOLN
3.0000 mL | Freq: Once | RESPIRATORY_TRACT | Status: AC
  Administered 2019-07-13: 3 mL via RESPIRATORY_TRACT
  Filled 2019-07-13: qty 3

## 2019-07-13 NOTE — ED Notes (Signed)
Patient on [facility] maintaining oxygen saturations of 96%. Will continue to monitor

## 2019-07-13 NOTE — ED Provider Notes (Signed)
[facility] Emergency Department Provider Note MRN:  [id number]  Arrival date & time: 07/13/19     Chief Complaint   Bleeding History of Present Illness   Ernest Clark. is a 86 y.o. year-old male with a history of COPD, hypertension, PAD presenting to the ED with chief complaint of bleeding.  Right forearm laceration due to fall this morning, went to [facility] emergency department but left prior to being seen due to extended wait time, went to urgent care, continued bleeding, brought here by EMS.  Family also concerned about UTI.  Patient is hypoxic on arrival.  Review of Systems  A complete 10 system review of systems was obtained and all systems are negative except as noted in the HPI and PMH.   Patient's Health History    Past Medical History:  Diagnosis Date  . COPD (chronic obstructive pulmonary disease) ([place])   . Coronary heart disease    remote PCI  . Diabetes mellitus   . DVT (deep venous thrombosis) ([place]) 2011  . Gout   . Hyperlipidemia   . Hypertension   . Peripheral arterial disease [facility])     Past Surgical History:  Procedure Laterality Date  . CARDIAC CATHETERIZATION  1994   w/ PTCA   . COLONOSCOPY     w/ polyp resection  . TONSILLECTOMY  as a child    Family History  Problem Relation Age of Onset  . Diabetes Father   . Diabetes Mother   . Diabetes Brother   . Diabetes Brother     Social History   Socioeconomic History  . Marital status: Divorced    Spouse name: Not on file  . Number of children: 2  . Years of education: Not on file  . Highest education level: Not on file  Occupational History  . Occupation: retired from [employer]  . Smoking status: Former Smoker    Packs/day: 2.00    Years: 60.00    Pack years: 120.00    Types: Cigarettes    Quit date: 01/02/2001    Years since quitting: 18.5  . Smokeless tobacco: Former Systems developer    Types: Chew  Substance and Sexual Activity  . Alcohol use: No    . Drug use: No  . Sexual activity: Not on file  Other Topics Concern  . Not on file  Social History Narrative  . Not on file   Social Determinants of Health   Financial Resource Strain:   . Difficulty of Paying Living Expenses:   Food Insecurity:   . Worried About Charity fundraiser in the Last Year:   . Arboriculturist in the Last Year:   Transportation Needs:   . Film/video editor (Medical):   Marland Kitchen Lack of Transportation (Non-Medical):   Physical Activity:   . Days of Exercise per Week:   . Minutes of Exercise per Session:   Stress:   . Feeling of Stress :   Social Connections:   . Frequency of Communication with Friends and Family:   . Frequency of Social Gatherings with Friends and Family:   . Attends Religious Services:   . Active Member of Clubs or Organizations:   . Attends Archivist Meetings:   Marland Kitchen Marital Status:   Intimate Partner Violence:   . Fear of Current or Ex-Partner:   . Emotionally Abused:   Marland Kitchen Physically Abused:   . Sexually Abused:      Physical Exam  Vitals:   07/13/19 2231 07/13/19 2258  BP: 137/74 (!) 136/56  Pulse: 89 88  Resp: 13 20  Temp:    SpO2: 98% 97%    CONSTITUTIONAL: Chronically ill-appearing, NAD NEURO:  Alert and oriented x 3, no focal deficits EYES:  eyes equal and reactive ENT/NECK:  no LAD, no JVD CARDIO: Regular rate, well-perfused, normal S1 and S2 PULM:  CTAB no wheezing or rhonchi GI/GU:  normal bowel sounds, non-distended, non-tender MSK/SPINE:  No gross deformities, no edema SKIN: Flap laceration to the right forearm with active bleeding PSYCH:  Appropriate speech and behavior  *Additional and/or pertinent findings included in MDM below  Diagnostic and Interventional Summary    EKG Interpretation  Date/Time:  Sunday July 13 2019 21:08:50 EDT Ventricular Rate:  90 PR Interval:    QRS Duration: 106 QT Interval:  388 QTC Calculation: 475 R Axis:   -44 Text Interpretation: Possible  second-degree AV block, prolonged PR interval Left axis deviation Low voltage, precordial leads Anteroseptal infarct, old Confirmed by Gerlene Fee [phone]) on 07/13/2019 9:40:46 PM      Labs Reviewed  URINALYSIS, ROUTINE W REFLEX MICROSCOPIC - Abnormal; Notable for the following components:      Result Value   Hgb urine dipstick Clark (*)    All other components within normal limits  CBC - Abnormal; Notable for the following components:   WBC 11.0 (*)    Hemoglobin 12.8 (*)    All other components within normal limits  COMPREHENSIVE METABOLIC PANEL - Abnormal; Notable for the following components:   Sodium 128 (*)    Chloride 93 (*)    Glucose, Bld 129 (*)    Calcium 8.4 (*)    Albumin 3.4 (*)    All other components within normal limits  URINALYSIS, MICROSCOPIC (REFLEX) - Abnormal; Notable for the following components:   Bacteria, UA RARE (*)    All other components within normal limits  TROPONIN I (HIGH SENSITIVITY) - Abnormal; Notable for the following components:   Troponin I (High Sensitivity) 31 (*)    All other components within normal limits  SARS CORONAVIRUS 2 BY RT PCR (HOSPITAL ORDER, [place] [facility])  CULTURE, BLOOD (SINGLE)  LACTIC ACID, PLASMA  PROTIME-INR  MAGNESIUM  TROPONIN I (HIGH SENSITIVITY)    DG Chest Port 1 View  Final Result      Medications  lidocaine (XYLOCAINE) 1 % (with pres) injection (  Given by Other 07/13/19 2258)  methylPREDNISolone sodium succinate (SOLU-MEDROL) 125 mg/2 mL injection 125 mg (125 mg Intravenous Given 07/13/19 2156)  ipratropium-albuterol (DUONEB) 0.5-2.5 (3) MG/3ML nebulizer solution 3 mL (3 mLs Nebulization Given 07/13/19 2151)  albuterol (PROVENTIL) (2.5 MG/3ML) 0.083% nebulizer solution 2.5 mg (2.5 mg Nebulization Given 07/13/19 2151)  albuterol (PROVENTIL) (2.5 MG/3ML) 0.083% nebulizer solution 5 mg (5 mg Nebulization Given 07/13/19 2208)  sodium chloride 0.9 % bolus 500 mL ( Intravenous Stopped 07/13/19  2253)  HYDROcodone-acetaminophen (NORCO/VICODIN) 5-325 MG per tablet 1 tablet (1 tablet Oral Given 07/13/19 2326)     Procedures  /  Critical Care .Marland KitchenLaceration Repair  Date/Time: 07/13/2019 8:49 PM Performed by: Maudie Flakes, MD Authorized by: Maudie Flakes, MD   Consent:    Consent obtained:  Emergent situation Anesthesia (see MAR for exact dosages):    Anesthesia method:  Local infiltration   Local anesthetic:  Lidocaine 1% w/o epi Laceration details:    Location: Right forearm.   Length (cm):  12   Depth (mm):  3 Repair type:    Repair type:  Intermediate Pre-procedure details:    Preparation:  Patient was prepped and draped in usual sterile fashion Exploration:    Hemostasis achieved with:  Tied off vessels   Wound exploration: wound explored through full range of motion and entire depth of wound probed and visualized     Contaminated: no   Treatment:    Area cleansed with:  Saline   Amount of cleaning:  Standard Subcutaneous repair:    Suture size:  5-0   Suture material:  Vicryl   Number of sutures:  1 Skin repair:    Repair method:  Sutures   Suture size:  4-0   Wound skin closure material used: Vicryl.   Suture technique:  Simple interrupted   Number of sutures:  13 Approximation:    Approximation:  Loose Post-procedure details:    Dressing:  Bulky dressing   Patient tolerance of procedure:  Tolerated well, no immediate complications Comments:     Large skin tear with underlying Clark arterial bleeding vessel.  Tied off with single figure-of-eight stitch, hemostasis achieved.  Skin tear then loosely approximated with sutures.  .Critical Care Performed by: Maudie Flakes, MD Authorized by: Maudie Flakes, MD   Critical care provider statement:    Critical care time (minutes):  32   Critical care was necessary to treat or prevent imminent or life-threatening deterioration of the following conditions: Hypoxic respiratory failure.   Critical care was  time spent personally by me on the following activities:  Discussions with consultants, evaluation of patient's response to treatment, examination of patient, ordering and performing treatments and interventions, ordering and review of laboratory studies, ordering and review of radiographic studies, pulse oximetry, re-evaluation of patient's condition, obtaining history from patient or surrogate and review of old charts    ED Course and Medical Decision Making  I have reviewed the triage vital signs, the nursing notes, and pertinent available records from the EMR.  Listed above are laboratory and imaging tests that I personally ordered, reviewed, and interpreted and then considered in my medical decision making (see below for details).      Fall, laceration to right forearm repaired as described above, arriving hypoxic requiring 2 to 3 L nasal cannula to maintain saturations.  History of COPD, considering exacerbation, symptomatic anemia, ACS, pneumonia.  Family also concerned about UTI per report, awaiting further information after discussing with family.  Work-up pending.  EKG is revealing a repeating pattern that could suggest Mobitz 1 type II AV block, will consult cardiology for their opinion.  Patient is requiring oxygen, does not use oxygen at home, he has some wheezes on exam, will treat with albuterol, Solu-Medrol.  Work-up is still pending.  Discussed EKG with Dr. Rodman Key of cardiology, who agrees that it is consistent with Mobitz type I, but there is no significant interventions required, recommend avoiding AV nodal blocking agents.  Patient is admitted to the hospital service for COPD exacerbation with hypoxia.  Ernest Clark. Ernest Small, MD [place] [email or web address]  Final Clinical Impressions(s) / ED Diagnoses     ICD-10-CM   1. COPD exacerbation ([place])  J44.1   2. Hypoxia  R09.02   3. AV block, Mobitz 1  I44.1     ED Discharge  Orders    None       Discharge Instructions Discussed with and Provided to Patient:   Discharge Instructions   None  Maudie Flakes, MD 07/13/19 [phone]

## 2019-07-13 NOTE — ED Triage Notes (Signed)
Pt to triage via [facility] from home.  Pt lives alone and is unable to complete ADLs independently per EMS.  Pt slid out of bed this morning.  Approx 3 inch skin tear to R forearm.  Bandage in place by EMS PTA.   Pt hard of hearing.

## 2019-07-13 NOTE — ED Notes (Signed)
Pts family stated they were leaving to go to the walk in clinic

## 2019-07-13 NOTE — ED Notes (Addendum)
Pt's son is at bedside. Concerned that pt has been weaker today. Concerned pt has an UTI. States foul smelling urine has been noted today. Denies any fevers.  On the monitor pt has an irregular rhythm. Appears to be  Wenckebach. Denies any chest pain or sob.

## 2019-07-13 NOTE — ED Notes (Signed)
Family member concerned patient may have UTI

## 2019-07-13 NOTE — ED Notes (Signed)
Pt c/o bilateral legs hurting and right arm hurting. MD aware and orders received.

## 2019-07-13 NOTE — ED Triage Notes (Addendum)
Pt presents by [facility] with right forearm laceration. Per report Pt was seen at [facility] ED this morning and left due to extended wait times. Was seen at an urgent care for his laceration. Throughout the day the wound continued to bleed. EMS states that fire placed a bandage which on arrival was saturated with blood. Bandage was removed. Pt had a large flap laceration with an obvious arterial bleed. MD called to room and wound was cleaned and arterial bleed was stopped and wound was sutured by MD. Pt is hoh. EMS also reports that family is concerned about a UTI. Pt had a low O2 sat on arrival 83%. Pt with faint wheezing noted.  Placed on 2l via n/c and 02 sats increased to 93%. Pt also with bruising to right anterior abdomen. States laceration was caused by a fall at home this morning. Denies hitting his head.

## 2019-07-14 ENCOUNTER — Emergency Department (HOSPITAL_BASED_OUTPATIENT_CLINIC_OR_DEPARTMENT_OTHER): Payer: Medicare Other

## 2019-07-14 DIAGNOSIS — R778 Other specified abnormalities of plasma proteins: Secondary | ICD-10-CM

## 2019-07-14 DIAGNOSIS — J9601 Acute respiratory failure with hypoxia: Secondary | ICD-10-CM | POA: Diagnosis not present

## 2019-07-14 DIAGNOSIS — R4182 Altered mental status, unspecified: Secondary | ICD-10-CM | POA: Diagnosis not present

## 2019-07-14 DIAGNOSIS — I2510 Atherosclerotic heart disease of native coronary artery without angina pectoris: Secondary | ICD-10-CM

## 2019-07-14 DIAGNOSIS — J9602 Acute respiratory failure with hypercapnia: Secondary | ICD-10-CM

## 2019-07-14 DIAGNOSIS — I441 Atrioventricular block, second degree: Secondary | ICD-10-CM | POA: Diagnosis not present

## 2019-07-14 LAB — TROPONIN I (HIGH SENSITIVITY): Troponin I (High Sensitivity): 16 ng/L (ref <18)

## 2019-07-14 LAB — CBC
HCT: 38.3 % — ABNORMAL LOW (ref 39.0–52.0)
Hemoglobin: 11.9 g/dL — ABNORMAL LOW (ref 13.0–17.0)
MCH: 28.1 pg (ref 26.0–34.0)
MCHC: 31.1 g/dL (ref 30.0–36.0)
MCV: 90.5 fL (ref 80.0–100.0)
Platelets: 225 10*3/uL (ref 150–400)
RBC: 4.23 MIL/uL (ref 4.22–5.81)
RDW: 14.9 % (ref 11.5–15.5)
WBC: 9.3 10*3/uL (ref 4.0–10.5)
nRBC: 0 % (ref 0.0–0.2)

## 2019-07-14 LAB — HEMOGLOBIN A1C
Hgb A1c MFr Bld: 7.8 % — ABNORMAL HIGH (ref 4.8–5.6)
Mean Plasma Glucose: 177.16 mg/dL

## 2019-07-14 LAB — CREATININE, SERUM
Creatinine, Ser: 0.95 mg/dL (ref 0.61–1.24)
GFR calc Af Amer: >60 mL/min (ref >60)
GFR calc non Af Amer: >60 mL/min (ref >60)

## 2019-07-14 LAB — CBG MONITORING, ED
Glucose-Capillary: 198 mg/dL — ABNORMAL HIGH (ref 70–99)
Glucose-Capillary: 231 mg/dL — ABNORMAL HIGH (ref 70–99)
Glucose-Capillary: 198 mg/dL — ABNORMAL HIGH (ref 70–99)
Glucose-Capillary: 208 mg/dL — ABNORMAL HIGH (ref 70–99)

## 2019-07-14 LAB — TSH: TSH: 4.424 u[IU]/mL (ref 0.350–4.500)

## 2019-07-14 LAB — OSMOLALITY: Osmolality: 282 mOsm/kg (ref 275–295)

## 2019-07-14 MED ORDER — ROSUVASTATIN CALCIUM 20 MG PO TABS
40.0000 mg | ORAL_TABLET | Freq: Every day | ORAL | Status: DC
  Administered 2019-07-14 – 2019-07-22 (×9): 40 mg via ORAL
  Filled 2019-07-14 (×9): qty 2

## 2019-07-14 MED ORDER — METHYLPREDNISOLONE SODIUM SUCC 125 MG IJ SOLR
80.0000 mg | Freq: Two times a day (BID) | INTRAMUSCULAR | Status: DC
  Administered 2019-07-14 – 2019-07-15 (×2): 80 mg via INTRAVENOUS
  Filled 2019-07-14 (×2): qty 2

## 2019-07-14 MED ORDER — INSULIN ASPART 100 UNIT/ML ~~LOC~~ SOLN
0.0000 [IU] | Freq: Three times a day (TID) | SUBCUTANEOUS | Status: DC
  Administered 2019-07-15: 3 [IU] via SUBCUTANEOUS
  Administered 2019-07-15: 5 [IU] via SUBCUTANEOUS
  Administered 2019-07-16: 2 [IU] via SUBCUTANEOUS
  Administered 2019-07-16 (×2): 3 [IU] via SUBCUTANEOUS
  Administered 2019-07-17: 8 [IU] via SUBCUTANEOUS
  Administered 2019-07-17 – 2019-07-18 (×3): 3 [IU] via SUBCUTANEOUS
  Administered 2019-07-18: 5 [IU] via SUBCUTANEOUS
  Administered 2019-07-18 – 2019-07-19 (×4): 3 [IU] via SUBCUTANEOUS
  Administered 2019-07-20: 2 [IU] via SUBCUTANEOUS
  Administered 2019-07-20 – 2019-07-21 (×5): 3 [IU] via SUBCUTANEOUS
  Administered 2019-07-22 (×2): 2 [IU] via SUBCUTANEOUS

## 2019-07-14 MED ORDER — INSULIN ASPART 100 UNIT/ML ~~LOC~~ SOLN
0.0000 [IU] | Freq: Every day | SUBCUTANEOUS | Status: DC
  Administered 2019-07-14: 5 [IU] via SUBCUTANEOUS
  Administered 2019-07-18: 2 [IU] via SUBCUTANEOUS

## 2019-07-14 MED ORDER — ALBUTEROL SULFATE (2.5 MG/3ML) 0.083% IN NEBU
5.0000 mg | INHALATION_SOLUTION | Freq: Once | RESPIRATORY_TRACT | Status: AC
  Administered 2019-07-14: 5 mg via RESPIRATORY_TRACT
  Filled 2019-07-14: qty 6

## 2019-07-14 MED ORDER — FERROUS SULFATE 325 (65 FE) MG PO TABS
325.0000 mg | ORAL_TABLET | Freq: Every day | ORAL | Status: DC
  Administered 2019-07-14 – 2019-07-22 (×9): 325 mg via ORAL
  Filled 2019-07-14 (×9): qty 1

## 2019-07-14 MED ORDER — DULOXETINE HCL 30 MG PO CPEP
30.0000 mg | ORAL_CAPSULE | Freq: Every day | ORAL | Status: DC
  Administered 2019-07-14 – 2019-07-22 (×9): 30 mg via ORAL
  Filled 2019-07-14 (×9): qty 1

## 2019-07-14 MED ORDER — ALBUTEROL SULFATE (2.5 MG/3ML) 0.083% IN NEBU
2.5000 mg | INHALATION_SOLUTION | RESPIRATORY_TRACT | Status: DC | PRN
  Administered 2019-07-14 – 2019-07-17 (×2): 2.5 mg via RESPIRATORY_TRACT
  Filled 2019-07-14 (×4): qty 3

## 2019-07-14 MED ORDER — ASPIRIN EC 81 MG PO TBEC
81.0000 mg | DELAYED_RELEASE_TABLET | Freq: Every day | ORAL | Status: DC
  Administered 2019-07-14 – 2019-07-22 (×9): 81 mg via ORAL
  Filled 2019-07-14 (×9): qty 1

## 2019-07-14 MED ORDER — ALLOPURINOL 100 MG PO TABS
100.0000 mg | ORAL_TABLET | Freq: Every day | ORAL | Status: DC
  Administered 2019-07-14 – 2019-07-22 (×9): 100 mg via ORAL
  Filled 2019-07-14 (×9): qty 1

## 2019-07-14 MED ORDER — ACETAMINOPHEN 650 MG RE SUPP: 650.0000 mg | Freq: Four times a day (QID) | RECTAL | Status: DC | PRN

## 2019-07-14 MED ORDER — LOSARTAN POTASSIUM 50 MG PO TABS: 50.0000 mg | ORAL_TABLET | Freq: Every day | ORAL | Status: DC

## 2019-07-14 MED ORDER — FUROSEMIDE 20 MG PO TABS
20.0000 mg | ORAL_TABLET | Freq: Every day | ORAL | Status: DC
  Administered 2019-07-14: 20 mg via ORAL
  Filled 2019-07-14: qty 1

## 2019-07-14 MED ORDER — LEVOTHYROXINE SODIUM 75 MCG PO TABS
75.0000 ug | ORAL_TABLET | Freq: Every day | ORAL | Status: DC
  Administered 2019-07-15 – 2019-07-22 (×7): 75 ug via ORAL
  Filled 2019-07-14 (×7): qty 1

## 2019-07-14 MED ORDER — MORPHINE SULFATE (PF) 2 MG/ML IV SOLN
2.0000 mg | INTRAVENOUS | Status: DC | PRN
  Administered 2019-07-17: 2 mg via INTRAVENOUS
  Filled 2019-07-14: qty 1

## 2019-07-14 MED ORDER — ENOXAPARIN SODIUM 40 MG/0.4ML ~~LOC~~ SOLN
40.0000 mg | SUBCUTANEOUS | Status: DC
  Administered 2019-07-14 – 2019-07-22 (×9): 40 mg via SUBCUTANEOUS
  Filled 2019-07-14 (×9): qty 0.4

## 2019-07-14 MED ORDER — OXYCODONE HCL 5 MG PO TABS
5.0000 mg | ORAL_TABLET | ORAL | Status: DC | PRN
  Administered 2019-07-17: 5 mg via ORAL
  Filled 2019-07-14: qty 1

## 2019-07-14 MED ORDER — SODIUM CHLORIDE 0.9 % IV SOLN
500.0000 mg | INTRAVENOUS | Status: DC
  Administered 2019-07-14: 500 mg via INTRAVENOUS
  Filled 2019-07-14 (×2): qty 500

## 2019-07-14 MED ORDER — PANTOPRAZOLE SODIUM 40 MG PO TBEC
40.0000 mg | DELAYED_RELEASE_TABLET | Freq: Every day | ORAL | Status: DC
  Administered 2019-07-14 – 2019-07-22 (×9): 40 mg via ORAL
  Filled 2019-07-14 (×9): qty 1

## 2019-07-14 MED ORDER — ACETAMINOPHEN 325 MG PO TABS
650.0000 mg | ORAL_TABLET | Freq: Four times a day (QID) | ORAL | Status: DC | PRN
  Administered 2019-07-20: 650 mg via ORAL
  Filled 2019-07-14: qty 2

## 2019-07-14 MED ORDER — IPRATROPIUM-ALBUTEROL 0.5-2.5 (3) MG/3ML IN SOLN
3.0000 mL | RESPIRATORY_TRACT | Status: DC
  Administered 2019-07-14 – 2019-07-17 (×14): 3 mL via RESPIRATORY_TRACT
  Filled 2019-07-14 (×14): qty 3

## 2019-07-14 NOTE — Consult Note (Addendum)
Cardiology Consultation:   Patient ID: Ernest Clark. MRN: [id number]; DOB: [birth year]  Admit date: 07/13/2019 Date of Consult: 07/14/2019  Primary Care Provider: Hulan Fess, MD [facility] Cardiologist: Dr. Gwenlyn Clark   Patient Profile:   Ernest Sar. is a 86 y.o. male with a hx of CAD s/p remote PCI, peripheral arterial disease, hypertension, hyperlipidemia, diabetes mellitus, COPD with prior tobacco smoking who is being seen today for the evaluation of bradycardia at the request of Dr. Doristine Clark.  History of peripheral arterial disease.  Lower extremity Doppler showed bilateral SFA disease without claudication symptoms.  Last seen by Dr. Gwenlyn Clark October 2018.  History of Present Illness:   Ernest Clark rollover from bed Sunday morning.  He was brought to Ernest Clark, ER  by EMS however left prior to being seen due to extended wait time.  He went to urgent care and evaluated.  He was discharged home.  However at home, he had significant bleeding from his right forearm due to injury/laceration and taken to [name].  He was Clark hypoxic at 83%.  Placed on oxygen with improved saturation.  Recently patient is feeling fatigue and tiredness.  No loss of consciousness.  Denies chest pain or shortness of breath.  No fever or chills.  He is hard of hearing.  History mostly provided by son at bedside and review of chart.  Patient lives independently.  Reports chronic cough.  Chest x-ray showed chronic vascular congestion.  CT of head with without acute finding.  He is admitted for COPD exacerbation and started on steroid.  EKG showed Mobitz 2.  He is not on any rate control agent.  Overnight fellow advised weight for watching.  Past Medical History:  Diagnosis Date   COPD (chronic obstructive pulmonary disease) ([place])    Coronary heart disease    remote PCI   Diabetes mellitus    DVT (deep venous thrombosis) ([place]) 2011   Gout    Hyperlipidemia    Hypertension     Peripheral arterial disease ([place])     Past Surgical History:  Procedure Laterality Date   CARDIAC CATHETERIZATION  1994   w/ PTCA    COLONOSCOPY     w/ polyp resection   TONSILLECTOMY  as a child       Inpatient Medications: Scheduled Meds:  allopurinol  100 mg Oral Daily   aspirin EC  81 mg Oral Daily   DULoxetine  30 mg Oral Daily   enoxaparin (LOVENOX) injection  40 mg Subcutaneous Q24H   ferrous sulfate  325 mg Oral Daily   furosemide  20 mg Oral Daily   insulin aspart  0-15 Units Subcutaneous TID WC   insulin aspart  0-5 Units Subcutaneous QHS   ipratropium-albuterol  3 mL Nebulization Q4H   [START ON 07/15/2019] levothyroxine  75 mcg Oral Q0600   methylPREDNISolone (SOLU-MEDROL) injection  80 mg Intravenous Q12H   pantoprazole  40 mg Oral Daily   rosuvastatin  40 mg Oral Daily   Continuous Infusions:  azithromycin 500 mg (07/14/19 1635)   PRN Meds: acetaminophen **OR** acetaminophen, albuterol, morphine injection, oxyCODONE  Allergies:    Allergies  Allergen Reactions   Actos [Pioglitazone Hydrochloride] Other (See Comments)    RIGHT LEG SWELLING    Metformin And Related Other (See Comments)    Reaction unknown    Social History:   Social History   Socioeconomic History   Marital status: Divorced    Spouse name: Not on file  Number of children: 2   Years of education: Not on file   Highest education level: Not on file  Occupational History   Occupation: retired from [employer]   Smoking status: Former Smoker    Packs/day: 2.00    Years: 60.00    Pack years: 120.00    Types: Cigarettes    Quit date: 01/02/2001    Years since quitting: 18.5   Smokeless tobacco: Former Systems developer    Types: Chew  Substance and Sexual Activity   Alcohol use: No   Drug use: No   Sexual activity: Not on file  Other Topics Concern   Not on file  Social History Narrative   Not on file   Social Determinants of Health   Financial Resource Strain:     Difficulty of Paying Living Expenses:   Food Insecurity:    Worried About Charity fundraiser in the Last Year:    Arboriculturist in the Last Year:   Transportation Needs:    Film/video editor (Medical):    Lack of Transportation (Non-Medical):   Physical Activity:    Days of Exercise per Week:    Minutes of Exercise per Session:   Stress:    Feeling of Stress :   Social Connections:    Frequency of Communication with Friends and Family:    Frequency of Social Gatherings with Friends and Family:    Attends Religious Services:    Active Member of Clubs or Organizations:    Attends Music therapist:    Marital Status:   Intimate Partner Violence:    Fear of Current or Ex-Partner:    Emotionally Abused:    Physically Abused:    Sexually Abused:     Family History:    Family History  Problem Relation Age of Onset   Diabetes Father    Diabetes Mother    Diabetes Brother    Diabetes Brother      ROS:  Please see the history of present illness.  All other ROS reviewed and negative.     Physical Exam/Data:   Vitals:   07/14/19 1000 07/14/19 1030 07/14/19 1100 07/14/19 1119  BP: 110/61 120/77 (!) 117/59 118/74  Pulse: 86 87 84 82  Resp: 17 17 16 20   Temp:    98.4 F (36.9 C)  TempSrc:    Oral  SpO2: 94% 94% 95% 95%  Weight:      Height:        Intake/Output Summary (Last 24 hours) at 07/14/2019 1732 Last data filed at 07/13/2019 2300 Gross per 24 hour  Intake 500.22 ml  Output 300 ml  Net 200.22 ml   Last 3 Weights 07/13/2019 07/13/2019 07/16/2018  Weight (lbs) 247 lb 248 lb 247 lb  Weight (kg) 112.038 kg 112.492 kg 112.038 kg     Body mass index is 37.56 kg/m.  General:  Well nourished, well developed, in no acute distress HEENT: normal Lymph: no adenopathy Neck: no JVD Endocrine:  No thryomegaly Vascular: No carotid bruits; FA pulses 2+ bilaterally without bruits  Cardiac:  normal S1, S2; RRR; no murmur  Lungs:  clear to auscultation  bilaterally, no wheezing, rhonchi or rales  Abd: soft, nontender, no hepatomegaly  Ext: no edema Musculoskeletal:  No deformities, BUE and BLE strength normal and equal Skin: warm and dry  Neuro:  CNs 2-12 intact, no focal abnormalities noted Psych:  Normal affect   EKG:  The EKG was personally reviewed  and demonstrates: Sinus rhythm with Mobitz 2 Telemetry:  Telemetry was personally reviewed and demonstrates:  *Sinus rhythm at rate in 70s to 80s, intermittently obese to  Relevant CV Studies: As above  Laboratory Data:  High Sensitivity Troponin:   Recent Labs  Lab 07/13/19 2122 07/13/19 2252 07/14/19 1324  TROPONINIHS 31* 26* 16     Chemistry Recent Labs  Lab 07/13/19 2122 07/14/19 1324  NA 128*  --   K 4.3  --   CL 93*  --   CO2 28  --   GLUCOSE 129*  --   BUN 16  --   CREATININE 0.74 0.95  CALCIUM 8.4*  --   GFRNONAA >60 >60  GFRAA >60 >60  ANIONGAP 7  --     Recent Labs  Lab 07/13/19 2122  PROT 6.5  ALBUMIN 3.4*  AST 41  ALT 21  ALKPHOS 90  BILITOT 1.1   Hematology Recent Labs  Lab 07/13/19 2122 07/14/19 1324  WBC 11.0* 9.3  RBC 4.47 4.23  HGB 12.8* 11.9*  HCT 39.8 38.3*  MCV 89.0 90.5  MCH 28.6 28.1  MCHC 32.2 31.1  RDW 14.8 14.9  PLT 244 225   BNPNo results for input(s): BNP, PROBNP in the last 168 hours.  DDimer No results for input(s): DDIMER in the last 168 hours.   Radiology/Studies:  CT Head Wo Contrast  Result Date: 07/14/2019 CLINICAL DATA:  Altered mental status. EXAM: CT HEAD WITHOUT CONTRAST TECHNIQUE: Contiguous axial images were obtained from the base of the skull through the vertex without intravenous contrast. COMPARISON:  07/04/2016 FINDINGS: Brain: Stable age related cerebral atrophy, ventriculomegaly and periventricular white matter disease. No extra-axial fluid collections are identified. No CT findings for acute hemispheric infarction or intracranial hemorrhage. No mass lesions. The brainstem and cerebellum are normal.  Vascular: Advanced vascular calcifications but no aneurysm or hyperdense vessels. Skull: No skull fracture or bone lesions. Sinuses/Orbits: The paranasal sinuses and mastoid air cells are clear. The globes are intact. Other: No scalp lesions or hematoma. IMPRESSION: 1. Stable age related cerebral atrophy, ventriculomegaly and periventricular white matter disease. 2. No acute intracranial findings or mass lesions. Electronically Signed   By: Marijo Sanes M.D.   On: 07/14/2019 06:56   DG Chest Port 1 View  Result Date: 07/13/2019 CLINICAL DATA:  Hypoxia, COPD EXAM: PORTABLE CHEST 1 VIEW COMPARISON:  07/07/2016 FINDINGS: Single frontal view of the chest demonstrates an enlarged cardiac silhouette. There is ectasia of the thoracic aorta, with mild atherosclerosis of the aortic arch. There is mild chronic central vascular congestion. Increased interstitial prominence may be related to COPD. No focal airspace disease, effusion, or pneumothorax. IMPRESSION: 1. Chronic central vascular congestion. 2. Interstitial prominence likely related emphysema. No acute airspace disease. Electronically Signed   By: Randa Ngo M.D.   On: 07/13/2019 21:16   {  Assessment and Plan:   1.  Mobitz 2 -Overall heart rate 70 to 80s.  Intermittent drop with Mobitz 2.  He has been weak since yesterday but no syncope.  He is not on any AV blocking agent.  2.  CAD -No angina.  Continue aspirin and statin.  3.  COPD exacerbation -Per primary team    For questions or updates, please contact [place] Please consult [email or web address] for contact info under    Jarrett Soho, [place]  07/14/2019 5:32 PM  Patient examined chart reviewed Discussed care with patient, son and PA. Exam with overweight elderly male. Hard of hearing. Exp wheezing  Distant heart sounds Soft abdomen and PVD with difficult to palpate pedal pulses. He has distant history of CAD. ECG no acute ST changes and troponin negative with no chest  pain.  He has no claudication but is fairly sedentary. ECG/Telemetry review shows SR with variable PR and rare dropped beats more consistent with sinus arrhythmia and wenkebach. K/Cr ok Not on any AV nodal blocking drugs. HR stable in 70-80 range. No indication for pacer or further w/u Avoid AV nodal blocking drugs. Care per primary service for COPD exacerbation, weakness and FTT. Patient lives alone and son agrees this is not a good situation patient should likely be placed in assisted living   Cardiology will sign off   Jenkins Rouge MD [facility]

## 2019-07-14 NOTE — H&P (Signed)
History and Physical    [place]. LOV:[id number] DOB: 06-16-1931 DOA: 07/13/2019  PCP: Hulan Fess, MD  Patient coming from: [facility]  I have personally briefly reviewed patient's old medical records in [place]  Chief Complaint: Generalized weakness & slid out from the bed  HPI: Ernest Choe. is a 86 y.o. male with medical history significant of COPD-not on home oxygen, hypertension, hyperlipidemia, coronary artery disease, diabetes, peripheral arterial disease, chronic hyponatremia, former tobacco/alcohol abuse presents to emergency department due to generalized weakness and slid out of the bed this morning-approximately 3 inch skin tear to right forearm-which was repaired at [facility].  Patient is hard of hearing.  I called patient's son who tells me that patient has been weak and has decreased mobility since couple of days he uses walker for ambulation however he has been having difficulty performing daily life activities.  He lives alone at home.  Has COPD however does not use oxygen at home.  He does have-chronic intermittent cough with shortness of breath and wheezing however son denies worsening of symptoms.  No history of chest pain, palpitation, leg swelling, orthopnea, PND.  No history of headache, blurry vision, slurred speech, loss of consciousness, facial droop, fever, chills, nausea, vomiting, diarrhea, urinary or bowel changes.  He quit smoking and alcohol use 25 years ago.  No history of illicit drug use.  ED Course: Upon arrival to ED: Patient's blood pressure soft, requiring 2 L of oxygen via nasal cannula to maintain oxygen saturation in 90s.  Afebrile with leukocytosis of 11,000, sodium 128, COVID-19: Negative, lactic acid, magnesium, INR: WNL.  UA is positive for rare bacteria.  Troponin trended down from 31-26. EKG with apparent Mobitz type II. Dr Sedonia Small reviewed EKG with Dr. Zigmund Daniel of cards at [facility] who said to avoid AVN blockers but  otherwise nothing to do. He was given steroids and breathing treatments.   Chest x-ray shows chronic emphysematous changes no pneumonia.  CT head was obtained in the ER as patient was having some twitches in his hands which came back negative for acute findings.  Patient received albuterol/DuoNeb breathing treatment along with methylprednisone loading dose and 500 cc of IV bolus of normal saline.  Patient transferred to [facility], ER for the admission for acute hypoxemic respiratory failure secondary to COPD exacerbation. Review of Systems: As per HPI otherwise negative.    Past Medical History:  Diagnosis Date  . COPD (chronic obstructive pulmonary disease) ([place])   . Coronary heart disease    remote PCI  . Diabetes mellitus   . DVT (deep venous thrombosis) ([place]) 2011  . Gout   . Hyperlipidemia   . Hypertension   . Peripheral arterial disease [facility])     Past Surgical History:  Procedure Laterality Date  . CARDIAC CATHETERIZATION  1994   w/ PTCA   . COLONOSCOPY     w/ polyp resection  . TONSILLECTOMY  as a child     reports that he quit smoking about 18 years ago. His smoking use included cigarettes. He has a 120.00 pack-year smoking history. He has quit using smokeless tobacco.  His smokeless tobacco use included chew. He reports that he does not drink alcohol and does not use drugs.  Allergies  Allergen Reactions  . Actos [Pioglitazone Hydrochloride] Other (See Comments)    RIGHT LEG SWELLING   . Metformin And Related Other (See Comments)    Family History  Problem Relation Age of Onset  .  Diabetes Father   . Diabetes Mother   . Diabetes Brother   . Diabetes Brother     Prior to Admission medications   Medication Sig Start Date End Date Taking? Authorizing Provider  acetaminophen (TYLENOL) 500 MG tablet Take 1,000 mg by mouth every 6 (six) hours as needed.    [provider]  albuterol (PROAIR HFA) 108 (90 BASE) MCG/ACT inhaler Inhale 2 puffs into the lungs  every 6 (six) hours as needed for wheezing or shortness of breath.     [provider]  allopurinol (ZYLOPRIM) 100 MG tablet TAKE 1 TABLET (100MG ) DAILY 10/13/14   [provider]  Jearl Klinefelter ELLIPTA 62.5-25 MCG/INH AEPB USE 1 INHALATION ORALLY ONE TIME DAILY 07/24/18   Collene Gobble, MD  aspirin 81 MG tablet Take 81 mg by mouth daily.     [provider]  BD PEN NEEDLE NANO U/F 32G X 4 MM MISC  09/21/17   [provider]  cetirizine (ZYRTEC) 10 MG tablet Take 10 mg by mouth daily as needed for allergies.    [provider]  docusate sodium (STOOL SOFTENER) 100 MG capsule Take 100 mg by mouth 2 (two) times daily.    [provider]  DULoxetine (CYMBALTA) 30 MG capsule Take 30 mg by mouth daily.    [provider]  Ferrous Sulfate (IRON) 325 (65 Fe) MG TABS Take 325 mg by mouth daily.    [provider]  furosemide (LASIX) 20 MG tablet Take 20 mg by mouth daily.  08/27/17   [provider]  HUMALOG KWIKPEN 100 UNIT/ML KwikPen Inject 26 Units into the skin daily.  09/25/17   [provider]  levothyroxine (SYNTHROID) 75 MCG tablet Take 75 mcg by mouth daily before breakfast.  05/08/18   [provider]  levothyroxine (SYNTHROID, LEVOTHROID) 25 MCG tablet Take 25 mcg by mouth daily before breakfast.    [provider]  losartan (COZAAR) 50 MG tablet Take 50 mg by mouth daily.  06/29/17   [provider]  Melatonin 10 MG TABS Take 10 mg by mouth at bedtime.    [provider]  olmesartan (BENICAR) 40 MG tablet One half daily 01/26/11   Tanda Rockers, MD  omeprazole (PRILOSEC) 20 MG capsule Take 20 mg by mouth 2 (two) times daily.    [provider]  polyethylene glycol (MIRALAX / GLYCOLAX) packet Take 17 g by mouth daily as needed for moderate constipation. 07/08/16   Rai, Ripudeep K, MD  predniSONE (DELTASONE) 10 MG tablet 40mg  daily for 3 days, then 30mg  daily for 3 days, then  20mg  daily for 3 days, then 10mg  daily for 3 days, then STOP. 09/02/18   Collene Gobble, MD  rosuvastatin (CRESTOR) 40 MG tablet Take 40 mg by mouth daily.      [provider]  sitaGLIPtin (JANUVIA) 100 MG tablet Take 100 mg by mouth daily.    [provider]  TRESIBA FLEXTOUCH 100 UNIT/ML SOPN FlexTouch Pen Inject 8 Units into the skin at bedtime.  11/10/17   [provider]    Physical Exam: Vitals:   07/14/19 1000 07/14/19 1030 07/14/19 1100 07/14/19 1119  BP: 110/61 120/77 (!) 117/59 118/74  Pulse: 86 87 84 82  Resp: 17 17 16 20   Temp:    98.4 F (36.9 C)  TempSrc:    Oral  SpO2: 94% 94% 95% 95%  Weight:      Height:  Constitutional: NAD, calm, comfortable, on 2 L, hard of hearing.  Not in distress Eyes: PERRL, lids and conjunctivae normal ENMT: Mucous membranes are moist. Posterior pharynx clear of any exudate or lesions.Normal dentition.  Neck: normal, supple, no masses, no thyromegaly Respiratory: Bilateral diffuse expiratory wheezing positive Cardiovascular: Regular rate and rhythm, no murmurs / rubs / gallops. No extremity edema. 2+ pedal pulses. No carotid bruits.  Abdomen: no tenderness, no masses palpated. No hepatosplenomegaly. Bowel sounds positive.  Musculoskeletal: no clubbing / cyanosis. No joint deformity upper and lower extremities. Good ROM, no contractures. Normal muscle tone.  Skin: no rashes, lesions, ulcers. No induration Neurologic: CN 2-12 grossly intact. Sensation intact, DTR normal. Strength 5/5 in all 4.  Psychiatric: Normal judgment and insight. Alert and oriented x 3. Normal mood.    Labs on Admission: I have personally reviewed following labs and imaging studies  CBC: Recent Labs  Lab 07/13/19 2122  WBC 11.0*  HGB 12.8*  HCT 39.8  MCV 89.0  PLT 378   Basic Metabolic Panel: Recent Labs  Lab 07/13/19 2122  NA 128*  K 4.3  CL 93*  CO2 28  GLUCOSE 129*  BUN 16  CREATININE 0.74  CALCIUM 8.4*  MG 2.0     GFR: Estimated Creatinine Clearance: 80.4 mL/min (by C-G formula based on SCr of 0.74 mg/dL). Liver Function Tests: Recent Labs  Lab 07/13/19 2122  AST 41  ALT 21  ALKPHOS 90  BILITOT 1.1  PROT 6.5  ALBUMIN 3.4*   No results for input(s): LIPASE, AMYLASE in the last 168 hours. No results for input(s): AMMONIA in the last 168 hours. Coagulation Profile: Recent Labs  Lab 07/13/19 2122  INR 1.0   Cardiac Enzymes: No results for input(s): CKTOTAL, CKMB, CKMBINDEX, TROPONINI in the last 168 hours. BNP (last 3 results) No results for input(s): PROBNP in the last 8760 hours. HbA1C: No results for input(s): HGBA1C in the last 72 hours. CBG: Recent Labs  Lab 07/14/19 0200 07/14/19 0553  GLUCAP 198* 231*   Lipid Profile: No results for input(s): CHOL, HDL, LDLCALC, TRIG, CHOLHDL, LDLDIRECT in the last 72 hours. Thyroid Function Tests: No results for input(s): TSH, T4TOTAL, FREET4, T3FREE, THYROIDAB in the last 72 hours. Anemia Panel: No results for input(s): VITAMINB12, FOLATE, FERRITIN, TIBC, IRON, RETICCTPCT in the last 72 hours. Urine analysis:    Component Value Date/Time   COLORURINE YELLOW 07/13/2019 2200   APPEARANCEUR CLEAR 07/13/2019 2200   LABSPEC 1.020 07/13/2019 2200   PHURINE 6.0 07/13/2019 2200   GLUCOSEU NEGATIVE 07/13/2019 2200   HGBUR SMALL (A) 07/13/2019 2200   BILIRUBINUR NEGATIVE 07/13/2019 2200   KETONESUR NEGATIVE 07/13/2019 2200   PROTEINUR NEGATIVE 07/13/2019 2200   NITRITE NEGATIVE 07/13/2019 2200   LEUKOCYTESUR NEGATIVE 07/13/2019 2200    Radiological Exams on Admission: CT Head Wo Contrast  Result Date: 07/14/2019 CLINICAL DATA:  Altered mental status. EXAM: CT HEAD WITHOUT CONTRAST TECHNIQUE: Contiguous axial images were obtained from the base of the skull through the vertex without intravenous contrast. COMPARISON:  07/04/2016 FINDINGS: Brain: Stable age related cerebral atrophy, ventriculomegaly and periventricular white matter  disease. No extra-axial fluid collections are identified. No CT findings for acute hemispheric infarction or intracranial hemorrhage. No mass lesions. The brainstem and cerebellum are normal. Vascular: Advanced vascular calcifications but no aneurysm or hyperdense vessels. Skull: No skull fracture or bone lesions. Sinuses/Orbits: The paranasal sinuses and mastoid air cells are clear. The globes are intact. Other: No scalp lesions or hematoma. IMPRESSION: 1. Stable  age related cerebral atrophy, ventriculomegaly and periventricular white matter disease. 2. No acute intracranial findings or mass lesions. Electronically Signed   By: Marijo Sanes M.D.   On: 07/14/2019 06:56   DG Chest Port 1 View  Result Date: 07/13/2019 CLINICAL DATA:  Hypoxia, COPD EXAM: PORTABLE CHEST 1 VIEW COMPARISON:  07/07/2016 FINDINGS: Single frontal view of the chest demonstrates an enlarged cardiac silhouette. There is ectasia of the thoracic aorta, with mild atherosclerosis of the aortic arch. There is mild chronic central vascular congestion. Increased interstitial prominence may be related to COPD. No focal airspace disease, effusion, or pneumothorax. IMPRESSION: 1. Chronic central vascular congestion. 2. Interstitial prominence likely related emphysema. No acute airspace disease. Electronically Signed   By: Randa Ngo M.D.   On: 07/13/2019 21:16    EKG: Independently reviewed.  Prolonged PR interval, incomplete left bundle branch block, prolonged QT interval, Mobitz type II block.  Assessment/Plan Principal Problem:   Acute hypoxemic respiratory failure (HCC) Active Problems:   Coronary heart disease   Essential hypertension   Hyperlipidemia   Hyponatremia   Type 2 diabetes mellitus without complication, with long-term current use of insulin (HCC)   COPD with acute exacerbation (HCC)   Elevated troponin   Acute hypoxemic respiratory failure secondary to COPD exacerbation: -Patient is requiring 2 L of oxygen to  maintain oxygen saturation in 90s.  Chest x-ray negative for acute findings.  Patient is afebrile with leukocytosis of 11.  COVID-19 negative, lactic acid: WNL -Received breathing treatment DuoNeb/albuterol and a loading dose of methylprednisone in ED. -Admit patient to stepdown unit for close monitoring. -Continue albuterol/DuoNeb as needed.  Continue methylprednisone IV twice daily.  Start patient on azithromycin -Patient is on continuous pulse ox-try to wean off of oxygen as tolerated. -Incentive spirometry  Generalized weakness: -Could be secondary to deconditioning/electrolyte abnormality -Blood pressure is labile.  Check orthostatic vitals & TSH -CT head is negative for acute findings.  Magnesium: WNL -Consult PT/OT -Fall precautions  Twitching of extremities: Unknown etiology -CT head was obtained which came back negative. -Patient is alert and oriented and no twitching noted upon my exam -Neuro checks every 4 hours -Consider EEG?  Mobitz type II block: -Reviewed home meds-patient is not on AV nodal blocker. -He is asymptomatic.  On telemetry -EDP at [facility] discussed with cardiology Dr. Zigmund Daniel who recommended to avoid AVN blockers but otherwise no other recommendation\ -Repeat EKG.  Check TSH -Consult cardiology.  Right arm laceration: -Repaired in ER.  Monitor H&H.  Tylenol as needed for pain control  Hyponatremia: Chronic sodium: 128 -Received 500 cc of IV normal saline bolus in ED. -Repeat BMP. -Check serum osmolality and urine sodium level  Coronary artery disease: Continue aspirin, statin -Troponin elevated at 31 trended down to 26.  Patient denies any ACS symptoms.  Trend troponin.  Hypertension: Blood pressure is on lower side -Hold losartan for now -Resume losartan once blood pressure is back to baseline  Gout: Continue allopurinol  Diabetes: Check A1c -Start patient on sliding scale insulin monitor blood sugar closely  GERD: Continue  PPI  Peripheral artery disease: Continue aspirin and statin  Hypothyroidism: Check TSH -Continue levothyroxine  DVT prophylaxis: Lovenox/SCD Code Status: DNR confirmed with the patient son Family Communication: None present at bedside.  Plan of care discussed with patient in length and he verbalized understanding and agreed with it. Disposition Plan: Likely home in 2 days Consults called: Cardiology Admission status: Inpatient   Mckinley Jewel MD [facility]  If 7PM-7AM, please contact night-coverage [email or web address] Password [facility]  07/14/2019, 12:50 PM

## 2019-07-14 NOTE — ED Notes (Addendum)
Pt with general twitching and difficult to awaken. Skin color pale. Pt has been alert and oriented times 3 throughout the night, but difficult to arouse at present. Will open his eyes and immediately go back to sleep. VSS.  Blood sugar is 231. Dr. Dina Rich does not want to treat this blood glucose at present.  I asked Dr. Dina Rich to assess pt.

## 2019-07-14 NOTE — ED Notes (Signed)
Transported to CT with this RN

## 2019-07-14 NOTE — ED Notes (Signed)
02 sats 94% after breathing treatment. Bed Alarm placed.

## 2019-07-14 NOTE — ED Notes (Addendum)
Returned from [place]. Pt is responding appropriately to questions asked by this RN, but immediately goes back to sleep. MD aware.

## 2019-07-14 NOTE — ED Provider Notes (Signed)
6:06 AM Asked by nursing to assess patient.  Patient with periodic twitching and worsening altered mental status.  On my evaluation, bilateral upper and lower extremity twitching noted sporadically.  No posturing noted.  Patient will arouse and look at me but is not following commands.  Blink to threat is intact.  It does not appear obvious that this is a seizure.  History notable for trauma.  Will order head CT given altered mental status.   Merryl Hacker, MD 07/14/19 [phone]

## 2019-07-14 NOTE — ED Notes (Signed)
Son Cleo updated, aware of wait for inpatient bed assignment.

## 2019-07-14 NOTE — ED Notes (Signed)
Call to [facility] for report for transport

## 2019-07-14 NOTE — ED Notes (Signed)
Pt's O2 sats 85% on RA. Pt with faint expiratory wheezing noted. RT at bedside for breathing treatment. Pt is trying to roll over the side rail. Repositioned for comfort.  Attempt to re-orient pt that he is in the hospital. VSS.

## 2019-07-14 NOTE — ED Notes (Signed)
Dr. Dina Rich aware that pt is confused to place and that he had some expiratory wheezing and was given a breathing treatment. CBG was checked and was 198. No further orders given at this time.

## 2019-07-14 NOTE — ED Notes (Signed)
[facility] arrived to transport to [facility] ED .  Call to son to make aware of transfer

## 2019-07-14 NOTE — ED Notes (Signed)
Pt's son left for the evening. Aware that pt will most likely not get a bed tonight at [facility] and that he will be updated when a bed becomes available.

## 2019-07-15 ENCOUNTER — Ambulatory Visit: Payer: Medicare Other | Admitting: Podiatry

## 2019-07-15 ENCOUNTER — Inpatient Hospital Stay (HOSPITAL_COMMUNITY): Payer: Medicare Other

## 2019-07-15 DIAGNOSIS — E119 Type 2 diabetes mellitus without complications: Secondary | ICD-10-CM

## 2019-07-15 DIAGNOSIS — Z7401 Bed confinement status: Secondary | ICD-10-CM | POA: Diagnosis not present

## 2019-07-15 DIAGNOSIS — Z79890 Hormone replacement therapy (postmenopausal): Secondary | ICD-10-CM | POA: Diagnosis not present

## 2019-07-15 DIAGNOSIS — E1151 Type 2 diabetes mellitus with diabetic peripheral angiopathy without gangrene: Secondary | ICD-10-CM | POA: Diagnosis present

## 2019-07-15 DIAGNOSIS — S51811A Laceration without foreign body of right forearm, initial encounter: Secondary | ICD-10-CM | POA: Diagnosis present

## 2019-07-15 DIAGNOSIS — E785 Hyperlipidemia, unspecified: Secondary | ICD-10-CM

## 2019-07-15 DIAGNOSIS — Z515 Encounter for palliative care: Secondary | ICD-10-CM | POA: Diagnosis not present

## 2019-07-15 DIAGNOSIS — M6281 Muscle weakness (generalized): Secondary | ICD-10-CM | POA: Diagnosis not present

## 2019-07-15 DIAGNOSIS — Z87891 Personal history of nicotine dependence: Secondary | ICD-10-CM | POA: Diagnosis not present

## 2019-07-15 DIAGNOSIS — Z20822 Contact with and (suspected) exposure to covid-19: Secondary | ICD-10-CM | POA: Diagnosis present

## 2019-07-15 DIAGNOSIS — M6258 Muscle wasting and atrophy, not elsewhere classified, other site: Secondary | ICD-10-CM | POA: Diagnosis not present

## 2019-07-15 DIAGNOSIS — E872 Acidosis: Secondary | ICD-10-CM | POA: Diagnosis present

## 2019-07-15 DIAGNOSIS — J441 Chronic obstructive pulmonary disease with (acute) exacerbation: Secondary | ICD-10-CM | POA: Diagnosis present

## 2019-07-15 DIAGNOSIS — R0902 Hypoxemia: Secondary | ICD-10-CM | POA: Diagnosis not present

## 2019-07-15 DIAGNOSIS — M2550 Pain in unspecified joint: Secondary | ICD-10-CM | POA: Diagnosis not present

## 2019-07-15 DIAGNOSIS — E871 Hypo-osmolality and hyponatremia: Secondary | ICD-10-CM | POA: Diagnosis present

## 2019-07-15 DIAGNOSIS — R627 Adult failure to thrive: Secondary | ICD-10-CM | POA: Diagnosis present

## 2019-07-15 DIAGNOSIS — R41841 Cognitive communication deficit: Secondary | ICD-10-CM | POA: Diagnosis not present

## 2019-07-15 DIAGNOSIS — Z794 Long term (current) use of insulin: Secondary | ICD-10-CM | POA: Diagnosis not present

## 2019-07-15 DIAGNOSIS — R1312 Dysphagia, oropharyngeal phase: Secondary | ICD-10-CM | POA: Diagnosis not present

## 2019-07-15 DIAGNOSIS — M109 Gout, unspecified: Secondary | ICD-10-CM | POA: Diagnosis present

## 2019-07-15 DIAGNOSIS — Z86718 Personal history of other venous thrombosis and embolism: Secondary | ICD-10-CM | POA: Diagnosis not present

## 2019-07-15 DIAGNOSIS — Y92003 Bedroom of unspecified non-institutional (private) residence as the place of occurrence of the external cause: Secondary | ICD-10-CM | POA: Diagnosis not present

## 2019-07-15 DIAGNOSIS — J9600 Acute respiratory failure, unspecified whether with hypoxia or hypercapnia: Secondary | ICD-10-CM | POA: Diagnosis not present

## 2019-07-15 DIAGNOSIS — J9601 Acute respiratory failure with hypoxia: Secondary | ICD-10-CM | POA: Diagnosis present

## 2019-07-15 DIAGNOSIS — R0602 Shortness of breath: Secondary | ICD-10-CM | POA: Diagnosis not present

## 2019-07-15 DIAGNOSIS — I2510 Atherosclerotic heart disease of native coronary artery without angina pectoris: Secondary | ICD-10-CM | POA: Diagnosis present

## 2019-07-15 DIAGNOSIS — R498 Other voice and resonance disorders: Secondary | ICD-10-CM | POA: Diagnosis not present

## 2019-07-15 DIAGNOSIS — Z66 Do not resuscitate: Secondary | ICD-10-CM | POA: Diagnosis present

## 2019-07-15 DIAGNOSIS — I10 Essential (primary) hypertension: Secondary | ICD-10-CM | POA: Diagnosis not present

## 2019-07-15 DIAGNOSIS — J90 Pleural effusion, not elsewhere classified: Secondary | ICD-10-CM | POA: Diagnosis present

## 2019-07-15 DIAGNOSIS — R2681 Unsteadiness on feet: Secondary | ICD-10-CM | POA: Diagnosis not present

## 2019-07-15 DIAGNOSIS — I441 Atrioventricular block, second degree: Secondary | ICD-10-CM | POA: Diagnosis present

## 2019-07-15 DIAGNOSIS — Z79899 Other long term (current) drug therapy: Secondary | ICD-10-CM | POA: Diagnosis not present

## 2019-07-15 DIAGNOSIS — Z7952 Long term (current) use of systemic steroids: Secondary | ICD-10-CM | POA: Diagnosis not present

## 2019-07-15 DIAGNOSIS — K219 Gastro-esophageal reflux disease without esophagitis: Secondary | ICD-10-CM | POA: Diagnosis present

## 2019-07-15 DIAGNOSIS — J9690 Respiratory failure, unspecified, unspecified whether with hypoxia or hypercapnia: Secondary | ICD-10-CM | POA: Diagnosis not present

## 2019-07-15 DIAGNOSIS — E039 Hypothyroidism, unspecified: Secondary | ICD-10-CM | POA: Diagnosis present

## 2019-07-15 DIAGNOSIS — Z7982 Long term (current) use of aspirin: Secondary | ICD-10-CM | POA: Diagnosis not present

## 2019-07-15 DIAGNOSIS — I959 Hypotension, unspecified: Secondary | ICD-10-CM | POA: Diagnosis not present

## 2019-07-15 DIAGNOSIS — W06XXXA Fall from bed, initial encounter: Secondary | ICD-10-CM | POA: Diagnosis present

## 2019-07-15 DIAGNOSIS — R2689 Other abnormalities of gait and mobility: Secondary | ICD-10-CM | POA: Diagnosis not present

## 2019-07-15 DIAGNOSIS — Z955 Presence of coronary angioplasty implant and graft: Secondary | ICD-10-CM | POA: Diagnosis not present

## 2019-07-15 LAB — BLOOD GAS, VENOUS
Acid-Base Excess: 3.6 mmol/L — ABNORMAL HIGH (ref 0.0–2.0)
Acid-Base Excess: 4.4 mmol/L — ABNORMAL HIGH (ref 0.0–2.0)
Bicarbonate: 31.1 mmol/L — ABNORMAL HIGH (ref 20.0–28.0)
Bicarbonate: 30.8 mmol/L — ABNORMAL HIGH (ref 20.0–28.0)
Drawn by: 4080
FIO2: 36
FIO2: 40
O2 Saturation: 75.2 %
O2 Saturation: 87.3 %
Patient temperature: 37
Patient temperature: 36.7
pCO2, Ven: 82.3 mmHg (ref 44.0–60.0)
pCO2, Ven: 68.7 mmHg — ABNORMAL HIGH (ref 44.0–60.0)
pH, Ven: 7.202 — ABNORMAL LOW (ref 7.250–7.430)
pH, Ven: 7.274 (ref 7.250–7.430)
pO2, Ven: 48 mmHg — ABNORMAL HIGH (ref 32.0–45.0)
pO2, Ven: 58.2 mmHg — ABNORMAL HIGH (ref 32.0–45.0)

## 2019-07-15 LAB — BASIC METABOLIC PANEL
Anion gap: 7 (ref 5–15)
BUN: 23 mg/dL (ref 8–23)
CO2: 28 mmol/L (ref 22–32)
Calcium: 8.7 mg/dL — ABNORMAL LOW (ref 8.9–10.3)
Chloride: 94 mmol/L — ABNORMAL LOW (ref 98–111)
Creatinine, Ser: 0.98 mg/dL (ref 0.61–1.24)
GFR calc Af Amer: >60 mL/min (ref >60)
GFR calc non Af Amer: >60 mL/min (ref >60)
Glucose, Bld: 167 mg/dL — ABNORMAL HIGH (ref 70–99)
Potassium: 5.3 mmol/L — ABNORMAL HIGH (ref 3.5–5.1)
Sodium: 129 mmol/L — ABNORMAL LOW (ref 135–145)

## 2019-07-15 LAB — CBC
HCT: 36.8 % — ABNORMAL LOW (ref 39.0–52.0)
Hemoglobin: 11.5 g/dL — ABNORMAL LOW (ref 13.0–17.0)
MCH: 28.7 pg (ref 26.0–34.0)
MCHC: 31.3 g/dL (ref 30.0–36.0)
MCV: 91.8 fL (ref 80.0–100.0)
Platelets: 248 10*3/uL (ref 150–400)
RBC: 4.01 MIL/uL — ABNORMAL LOW (ref 4.22–5.81)
RDW: 14.7 % (ref 11.5–15.5)
WBC: 11.5 10*3/uL — ABNORMAL HIGH (ref 4.0–10.5)
nRBC: 0 % (ref 0.0–0.2)

## 2019-07-15 LAB — GLUCOSE, CAPILLARY
Glucose-Capillary: 209 mg/dL — ABNORMAL HIGH (ref 70–99)
Glucose-Capillary: 296 mg/dL — ABNORMAL HIGH (ref 70–99)
Glucose-Capillary: 181 mg/dL — ABNORMAL HIGH (ref 70–99)
Glucose-Capillary: 166 mg/dL — ABNORMAL HIGH (ref 70–99)

## 2019-07-15 LAB — COMPREHENSIVE METABOLIC PANEL
ALT: 25 U/L (ref 0–44)
AST: 32 U/L (ref 15–41)
Albumin: 3.1 g/dL — ABNORMAL LOW (ref 3.5–5.0)
Alkaline Phosphatase: 76 U/L (ref 38–126)
Anion gap: 8 (ref 5–15)
BUN: 22 mg/dL (ref 8–23)
CO2: 27 mmol/L (ref 22–32)
Calcium: 8.9 mg/dL (ref 8.9–10.3)
Chloride: 93 mmol/L — ABNORMAL LOW (ref 98–111)
Creatinine, Ser: 1.02 mg/dL (ref 0.61–1.24)
GFR calc Af Amer: >60 mL/min (ref >60)
GFR calc non Af Amer: >60 mL/min (ref >60)
Glucose, Bld: 240 mg/dL — ABNORMAL HIGH (ref 70–99)
Potassium: 4.8 mmol/L (ref 3.5–5.1)
Sodium: 128 mmol/L — ABNORMAL LOW (ref 135–145)
Total Bilirubin: 1 mg/dL (ref 0.3–1.2)
Total Protein: 6 g/dL — ABNORMAL LOW (ref 6.5–8.1)

## 2019-07-15 LAB — OSMOLALITY, URINE: Osmolality, Ur: 569 mOsm/kg (ref 300–900)

## 2019-07-15 LAB — SODIUM, URINE, RANDOM: Sodium, Ur: <10 mmol/L

## 2019-07-15 LAB — OSMOLALITY: Osmolality: 287 mOsm/kg (ref 275–295)

## 2019-07-15 MED ORDER — AZITHROMYCIN 250 MG PO TABS
250.0000 mg | ORAL_TABLET | Freq: Every day | ORAL | Status: DC
  Administered 2019-07-15 – 2019-07-18 (×4): 250 mg via ORAL
  Filled 2019-07-15 (×4): qty 1

## 2019-07-15 MED ORDER — ACETYLCYSTEINE 20 % IN SOLN: 4.0000 mL | Freq: Two times a day (BID) | RESPIRATORY_TRACT | Status: DC

## 2019-07-15 MED ORDER — METHYLPREDNISOLONE SODIUM SUCC 40 MG IJ SOLR
40.0000 mg | Freq: Two times a day (BID) | INTRAMUSCULAR | Status: DC
  Administered 2019-07-15 – 2019-07-17 (×4): 40 mg via INTRAVENOUS
  Filled 2019-07-15 (×4): qty 1

## 2019-07-15 MED ORDER — ACETYLCYSTEINE 20 % IN SOLN
4.0000 mL | Freq: Two times a day (BID) | RESPIRATORY_TRACT | Status: AC
  Administered 2019-07-15 – 2019-07-16 (×3): 4 mL via RESPIRATORY_TRACT
  Filled 2019-07-15 (×2): qty 4

## 2019-07-15 MED ORDER — DM-GUAIFENESIN ER 30-600 MG PO TB12
1.0000 | ORAL_TABLET | Freq: Two times a day (BID) | ORAL | Status: DC
  Administered 2019-07-15 – 2019-07-16 (×3): 1 via ORAL
  Filled 2019-07-15 (×3): qty 1

## 2019-07-15 MED ORDER — ACETYLCYSTEINE 20 % IN SOLN
600.0000 mg | Freq: Two times a day (BID) | RESPIRATORY_TRACT | Status: DC
  Administered 2019-07-15: 600 mg via ORAL
  Filled 2019-07-15 (×3): qty 4

## 2019-07-15 NOTE — Progress Notes (Signed)
PT Cancellation Note  Patient Details Name: Ernest Clark. MRN: [id number] DOB: [birth year]   Cancelled Treatment:    Reason Eval/Treat Not Completed: (P) Medical issues which prohibited therapy Pt with increased WoB and oxygen demand. RN requests deferment of Evaluation until tomorrow.  Avery Klingbeil B. Migdalia Dk PT, DPT Acute Rehabilitation Services Pager [phone] Office [phone]    [place] 07/15/2019, 1:38 PM

## 2019-07-15 NOTE — Progress Notes (Signed)
PROGRESS NOTE    Ernest Clark.   URK:[id number]  DOB: 07/29/1931  DOA: 07/13/2019 PCP: Hulan Fess, MD   Brief Narrative:  Ernest Welz. is a 86 y.o. male with medical history significant of COPD-not on home oxygen, hypertension, hyperlipidemia, coronary artery disease, diabetes, peripheral arterial disease, chronic hyponatremia, former tobacco/alcohol abuse presents to emergency department due to generalized weakness and slid out of the bed this morning-approximately 3 inch skin tear to right forearm-which was repaired at [facility]. Per his is son, the patient who lives alone has been increasingly weak and having trouble with ALDs.  In ED pulse ox in 80s. Sodium 128. EKG > Wenckebach heart block. - admitted for COPD exacerbation/ hypoxemic respiratory failure.   Subjective: No complaints.   Assessment & Plan:   Principal Problem:   Acute hypoxemic respiratory failure/  COPD with acute exacerbation  - he has a significant amount of rhonchi, wheezing and abdominal breathing -cont IV steroids, Z pak, Nebulizer treatments and add Mucomyst Nebs to help mobilize some sputum today  Active Problems: Heart block - Wenckebach- cardiology consulted- monitor  Hyponatremia - hold Lasix- does not appear fluid overloaded, may be dehydrated - ? SIAHD> check Urine sodium, osmolality and serum osmolality - he received IVF in ED yesterday- follow for now    Coronary heart disease/ PAD - cont ASA and Crestor    Essential hypertension - Cozar on hold      Type 2 diabetes mellitus without complication, with long-term current use of insulin   . Holding Tresiba - cont SSI     Time spent in minutes: 35 DVT prophylaxis: Lovenox Code Status: DNR Family Communication:  Disposition Plan:  Status is: Observation  The patient will require care spanning > 2 midnights and should be moved to inpatient because: cont IV Solumedrol for COPD exacerbation  Dispo: The  patient is from: Home              Anticipated d/c is to: SNF              Anticipated d/c date is: 2 days              Patient currently is not medically stable to d/c.      Consultants:   cardiology Procedures:   none Antimicrobials:  Anti-infectives (From admission, onward)   Start     Dose/Rate Route Frequency Ordered Stop   07/14/19 1315  azithromycin (ZITHROMAX) 500 mg in sodium chloride 0.9 % 250 mL IVPB     Discontinue     500 mg 250 mL/hr over 60 Minutes Intravenous Every 24 hours 07/14/19 1303         Objective: Vitals:   07/15/19 0010 07/15/19 0441 07/15/19 0726 07/15/19 0751  BP: (!) 111/46  (!) 142/68   Pulse:   93   Resp:      Temp:  98 F (36.7 C) 98.3 F (36.8 C)   TempSrc:   Oral   SpO2:    96%  Weight:  107.2 kg    Height:        Intake/Output Summary (Last 24 hours) at 07/15/2019 0943 Last data filed at 07/14/2019 1910 Gross per 24 hour  Intake 250 ml  Output --  Net 250 ml   Filed Weights   07/13/19 2208 07/14/19 2300 07/15/19 0441  Weight: 112 kg 106.4 kg 107.2 kg    Examination: General exam: Appears comfortable  HEENT: PERRLA, oral mucosa moist, no  sclera icterus or thrush- extremely poor hearing Respiratory system: severe rhonchi and wheezing bilaterally Cardiovascular system: S1 & S2 heard, RRR.   Gastrointestinal system: Abdomen soft, non-tender, nondistended. Normal bowel sounds. Central nervous system: Alert and oriented. No focal neurological deficits. Extremities: No cyanosis, clubbing or edema Skin: No rashes or ulcers Psychiatry:  Mood & affect appropriate.     Data Reviewed: I have personally reviewed following labs and imaging studies  CBC: Recent Labs  Lab 07/13/19 2122 07/14/19 1324 07/15/19 0407  WBC 11.0* 9.3 11.5*  HGB 12.8* 11.9* 11.5*  HCT 39.8 38.3* 36.8*  MCV 89.0 90.5 91.8  PLT 244 225 093   Basic Metabolic Panel: Recent Labs  Lab 07/13/19 2122 07/14/19 1324 07/15/19 0407  NA 128*  --  128*   K 4.3  --  4.8  CL 93*  --  93*  CO2 28  --  27  GLUCOSE 129*  --  240*  BUN 16  --  22  CREATININE 0.74 0.95 1.02  CALCIUM 8.4*  --  8.9  MG 2.0  --   --    GFR: Estimated Creatinine Clearance: 61.7 mL/min (by C-G formula based on SCr of 1.02 mg/dL). Liver Function Tests: Recent Labs  Lab 07/13/19 2122 07/15/19 0407  AST 41 32  ALT 21 25  ALKPHOS 90 76  BILITOT 1.1 1.0  PROT 6.5 6.0*  ALBUMIN 3.4* 3.1*   No results for input(s): LIPASE, AMYLASE in the last 168 hours. No results for input(s): AMMONIA in the last 168 hours. Coagulation Profile: Recent Labs  Lab 07/13/19 2122  INR 1.0   Cardiac Enzymes: No results for input(s): CKTOTAL, CKMB, CKMBINDEX, TROPONINI in the last 168 hours. BNP (last 3 results) No results for input(s): PROBNP in the last 8760 hours. HbA1C: Recent Labs    07/14/19 1336  HGBA1C 7.8*   CBG: Recent Labs  Lab 07/14/19 0200 07/14/19 0553 07/14/19 1942 07/14/19 2214  GLUCAP 198* 231* 198* 208*   Lipid Profile: No results for input(s): CHOL, HDL, LDLCALC, TRIG, CHOLHDL, LDLDIRECT in the last 72 hours. Thyroid Function Tests: Recent Labs    07/14/19 1324  TSH 4.424   Anemia Panel: No results for input(s): VITAMINB12, FOLATE, FERRITIN, TIBC, IRON, RETICCTPCT in the last 72 hours. Urine analysis:    Component Value Date/Time   COLORURINE YELLOW 07/13/2019 2200   APPEARANCEUR CLEAR 07/13/2019 2200   LABSPEC 1.020 07/13/2019 2200   PHURINE 6.0 07/13/2019 2200   GLUCOSEU NEGATIVE 07/13/2019 2200   HGBUR SMALL (A) 07/13/2019 2200   BILIRUBINUR NEGATIVE 07/13/2019 2200   KETONESUR NEGATIVE 07/13/2019 2200   PROTEINUR NEGATIVE 07/13/2019 2200   NITRITE NEGATIVE 07/13/2019 2200   LEUKOCYTESUR NEGATIVE 07/13/2019 2200   Sepsis Labs: @LABRCNTIP (procalcitonin:4,lacticidven:4) ) Recent Results (from the past 240 hour(s))  SARS Coronavirus 2 by RT PCR (hospital order, performed in [place] hospital lab) Nasopharyngeal  Nasopharyngeal Swab     Status: None   Collection Time: 07/13/19  9:47 PM   Specimen: Nasopharyngeal Swab  Result Value Ref Range Status   SARS Coronavirus 2 NEGATIVE NEGATIVE Final    Comment: (NOTE) SARS-CoV-2 target nucleic acids are NOT DETECTED.  The SARS-CoV-2 RNA is generally detectable in upper and lower respiratory specimens during the acute phase of infection. The lowest concentration of SARS-CoV-2 viral copies this assay can detect is 250 copies / mL. A negative result does not preclude SARS-CoV-2 infection and should not be used as the sole basis for treatment or other patient management  decisions.  A negative result may occur with improper specimen collection / handling, submission of specimen other than nasopharyngeal swab, presence of viral mutation(s) within the areas targeted by this assay, and inadequate number of viral copies (<250 copies / mL). A negative result must be combined with clinical observations, patient history, and epidemiological information.  Fact Sheet for Patients:   StrictlyIdeas.no  Fact Sheet for Healthcare Providers: BankingDealers.co.za  This test is not yet approved or  cleared by the [place] FDA and has been authorized for detection and/or diagnosis of SARS-CoV-2 by FDA under an Emergency Use Authorization (EUA).  This EUA will remain in effect (meaning this test can be used) for the duration of the COVID-19 declaration under Section 564(b)(1) of the Act, 21 U.S.C. section 360bbb-3(b)(1), unless the authorization is terminated or revoked sooner.  Performed at [facility], [address], [place], [place] [address]          Radiology Studies: CT Head Wo Contrast  Result Date: 07/14/2019 CLINICAL DATA:  Altered mental status. EXAM: CT HEAD WITHOUT CONTRAST TECHNIQUE: Contiguous axial images were obtained from the base of the skull through the vertex without  intravenous contrast. COMPARISON:  07/04/2016 FINDINGS: Brain: Stable age related cerebral atrophy, ventriculomegaly and periventricular white matter disease. No extra-axial fluid collections are identified. No CT findings for acute hemispheric infarction or intracranial hemorrhage. No mass lesions. The brainstem and cerebellum are normal. Vascular: Advanced vascular calcifications but no aneurysm or hyperdense vessels. Skull: No skull fracture or bone lesions. Sinuses/Orbits: The paranasal sinuses and mastoid air cells are clear. The globes are intact. Other: No scalp lesions or hematoma. IMPRESSION: 1. Stable age related cerebral atrophy, ventriculomegaly and periventricular white matter disease. 2. No acute intracranial findings or mass lesions. Electronically Signed   By: Marijo Sanes M.D.   On: 07/14/2019 06:56   DG Chest Port 1 View  Result Date: 07/13/2019 CLINICAL DATA:  Hypoxia, COPD EXAM: PORTABLE CHEST 1 VIEW COMPARISON:  07/07/2016 FINDINGS: Single frontal view of the chest demonstrates an enlarged cardiac silhouette. There is ectasia of the thoracic aorta, with mild atherosclerosis of the aortic arch. There is mild chronic central vascular congestion. Increased interstitial prominence may be related to COPD. No focal airspace disease, effusion, or pneumothorax. IMPRESSION: 1. Chronic central vascular congestion. 2. Interstitial prominence likely related emphysema. No acute airspace disease. Electronically Signed   By: Randa Ngo M.D.   On: 07/13/2019 21:16      Scheduled Meds: . acetylcysteine  600 mg Oral BID  . allopurinol  100 mg Oral Daily  . aspirin EC  81 mg Oral Daily  . dextromethorphan-guaiFENesin  1 tablet Oral BID  . DULoxetine  30 mg Oral Daily  . enoxaparin (LOVENOX) injection  40 mg Subcutaneous Q24H  . ferrous sulfate  325 mg Oral Daily  . insulin aspart  0-15 Units Subcutaneous TID WC  . insulin aspart  0-5 Units Subcutaneous QHS  . ipratropium-albuterol  3 mL  Nebulization Q4H  . levothyroxine  75 mcg Oral Q0600  . methylPREDNISolone (SOLU-MEDROL) injection  40 mg Intravenous Q12H  . pantoprazole  40 mg Oral Daily  . rosuvastatin  40 mg Oral Daily   Continuous Infusions: . azithromycin Stopped (07/14/19 1910)     LOS: 0 days      Debbe Odea, MD [facility] Pager: [email or web address] 07/15/2019, 9:43 AM

## 2019-07-15 NOTE — Significant Event (Addendum)
Rapid Response Event Note  Overview: Arrival Time: 1401 Event Type: Respiratory Pt with increased work of breathing. Asked by charge RN to evaluate pt.   Initial Focused Assessment: Pt lying in bed, resting. He is noted to have fine muscle twitch in bilateral upper arms. Pt opens eyes to voice. Moves all extremities, follows commands. Oriented to person, place, and day of the week. Inspiratory and expiratory wheeze with prolonged expiration. Accessory muscle use. Pt short of breath. Pt is warm, dry. Mild diaphoresis to pt's face and chest. RN states that pt has experienced coughing with oral intake. No JVD, radial pulse 2+.  VS: BP 127/70 (86), HR 95, RR 19, SpO2 94% on 3LNC   Interventions: -VBG  7.202/82.3/48/31.1 prior to BiPAP -BiPAP  15/5  40% -CXR  Plan of Care (if not transferred): Pt breathing improved with BiPAP. Wheeze only heard on expiration. RT providing neb treatment.  VS: BP 127/64, HR 90, RR 20, SpO2 97% on BiPAP 40%  -Blood gas 1 hour post BiPAP ~1600 -Monitor pt for improved alertness -Aspiration precautions  1730: Update: Follow-up VBG 7.274/68.7/58.2/30.8   Pt awake, alert & oriented. Pt breathing comfortably.   Event Summary: Name of Physician Notified: Dr. Wynelle Cleveland at 1455 Outcome: Stayed in room and stabalized Event End Time: [place]

## 2019-07-15 NOTE — Progress Notes (Signed)
VBG sample obtained prior to patient being placed on bipap.  Will see about ordering repeat sample after being on bipap.  Patient tolerating well.     Ref. Range 07/15/2019 13:59  Sample type Unknown VENOUS  FIO2 Unknown 36.00  pH, Ven Latest Ref Range: 7.25 - 7.43  7.202 (L)  pCO2, Ven Latest Ref Range: 44 - 60 mmHg 82.3 (HH)  pO2, Ven Latest Ref Range: 32 - 45 mmHg 48.0 (H)  Acid-Base Excess Latest Ref Range: 0.0 - 2.0 mmol/L 3.6 (H)  Bicarbonate Latest Ref Range: 20.0 - 28.0 mmol/L 31.1 (H)  O2 Saturation Latest Units: % 75.2  Patient temperature Unknown 37.0  Collection site Unknown RIGHT ANTECUBITAL

## 2019-07-15 NOTE — Progress Notes (Signed)
OT Cancellation Note  Patient Details Name: Ernest Clark. MRN: [id number] DOB: [birth year]   Cancelled Treatment:    Reason Eval/Treat Not Completed: Patient not medically ready (Pt on bipap at this time. OT to eval tomorrow.)   Jefferey Pica, OTR/L Acute Rehabilitation Services Pager: [phone] Office: [phone]   Jefferey Pica 07/15/2019, 3:18 PM

## 2019-07-15 NOTE — Progress Notes (Signed)
Patient placed on bipap due to increased work of breathing.  Patient currently tolerating well with sats of 97%.  Will continue to monitor.

## 2019-07-15 NOTE — Progress Notes (Addendum)
Pt requesting to come off BIPAP to eat. BIPAP removed, pt placed on 4L [place]. Top dentures were still in pt's mouth, placed in denture cup with bottom dentures.

## 2019-07-15 NOTE — Progress Notes (Signed)
Subjective:  No cardiac complaints Hard of hearing   Objective:  Vitals:   07/15/19 0010 07/15/19 0441 07/15/19 0726 07/15/19 0751  BP: (!) 111/46  (!) 142/68   Pulse:   93   Resp:      Temp:  98 F (36.7 C) 98.3 F (36.8 C)   TempSrc:   Oral   SpO2:    96%  Weight:  107.2 kg    Height:        Intake/Output from previous day:  Intake/Output Summary (Last 24 hours) at 07/15/2019 0843 Last data filed at 07/14/2019 1910 Gross per 24 hour  Intake 250 ml  Output --  Net 250 ml    Physical Exam: Affect appropriate Overweight elderly male  HEENT: normal Neck supple with no adenopathy JVP normal no bruits no thyromegaly Lungs exp  wheezing and good diaphragmatic motion Heart:  S1/S2 no murmur, no rub, gallop or click PMI normal Abdomen: benighn, BS positve, no tenderness, no AAA no bruit.  No HSM or HJR Pedal pulses hard to palpate  Trace  edema Neuro non-focal Skin warm and dry No muscular weakness   Lab Results: Basic Metabolic Panel: Recent Labs    07/13/19 2122 07/13/19 2122 07/14/19 1324 07/15/19 0407  NA 128*  --   --  128*  K 4.3  --   --  4.8  CL 93*  --   --  93*  CO2 28  --   --  27  GLUCOSE 129*  --   --  240*  BUN 16  --   --  22  CREATININE 0.74   < > 0.95 1.02  CALCIUM 8.4*  --   --  8.9  MG 2.0  --   --   --    < > = values in this interval not displayed.   Liver Function Tests: Recent Labs    07/13/19 2122 07/15/19 0407  AST 41 32  ALT 21 25  ALKPHOS 90 76  BILITOT 1.1 1.0  PROT 6.5 6.0*  ALBUMIN 3.4* 3.1*   No results for input(s): LIPASE, AMYLASE in the last 72 hours. CBC: Recent Labs    07/14/19 1324 07/15/19 0407  WBC 9.3 11.5*  HGB 11.9* 11.5*  HCT 38.3* 36.8*  MCV 90.5 91.8  PLT 225 248   Cardiac Enzymes: No results for input(s): CKTOTAL, CKMB, CKMBINDEX, TROPONINI in the last 72 hours. BNP: Invalid input(s): POCBNP D-Dimer: No results for input(s): DDIMER in the last 72 hours. Hemoglobin A1C: Recent  Labs    07/14/19 1336  HGBA1C 7.8*   Fasting Lipid Panel: No results for input(s): CHOL, HDL, LDLCALC, TRIG, CHOLHDL, LDLDIRECT in the last 72 hours. Thyroid Function Tests: Recent Labs    07/14/19 1324  TSH 4.424   Anemia Panel: No results for input(s): VITAMINB12, FOLATE, FERRITIN, TIBC, IRON, RETICCTPCT in the last 72 hours.  Imaging: CT Head Wo Contrast  Result Date: 07/14/2019 CLINICAL DATA:  Altered mental status. EXAM: CT HEAD WITHOUT CONTRAST TECHNIQUE: Contiguous axial images were obtained from the base of the skull through the vertex without intravenous contrast. COMPARISON:  07/04/2016 FINDINGS: Brain: Stable age related cerebral atrophy, ventriculomegaly and periventricular white matter disease. No extra-axial fluid collections are identified. No CT findings for acute hemispheric infarction or intracranial hemorrhage. No mass lesions. The brainstem and cerebellum are normal. Vascular: Advanced vascular calcifications but no aneurysm or hyperdense vessels. Skull: No skull fracture or bone lesions. Sinuses/Orbits: The paranasal sinuses and mastoid air  cells are clear. The globes are intact. Other: No scalp lesions or hematoma. IMPRESSION: 1. Stable age related cerebral atrophy, ventriculomegaly and periventricular white matter disease. 2. No acute intracranial findings or mass lesions. Electronically Signed   By: Marijo Sanes M.D.   On: 07/14/2019 06:56   DG Chest Port 1 View  Result Date: 07/13/2019 CLINICAL DATA:  Hypoxia, COPD EXAM: PORTABLE CHEST 1 VIEW COMPARISON:  07/07/2016 FINDINGS: Single frontal view of the chest demonstrates an enlarged cardiac silhouette. There is ectasia of the thoracic aorta, with mild atherosclerosis of the aortic arch. There is mild chronic central vascular congestion. Increased interstitial prominence may be related to COPD. No focal airspace disease, effusion, or pneumothorax. IMPRESSION: 1. Chronic central vascular congestion. 2. Interstitial  prominence likely related emphysema. No acute airspace disease. Electronically Signed   By: Randa Ngo M.D.   On: 07/13/2019 21:16    Cardiac Studies:  ECG: SR first degree AV block PAC ICRBBB    Telemetry:  SR with periods of Wenkebach no long pauses HR 70-90 bpm     Medications:   . allopurinol  100 mg Oral Daily  . aspirin EC  81 mg Oral Daily  . DULoxetine  30 mg Oral Daily  . enoxaparin (LOVENOX) injection  40 mg Subcutaneous Q24H  . ferrous sulfate  325 mg Oral Daily  . insulin aspart  0-15 Units Subcutaneous TID WC  . insulin aspart  0-5 Units Subcutaneous QHS  . ipratropium-albuterol  3 mL Nebulization Q4H  . levothyroxine  75 mcg Oral Q0600  . methylPREDNISolone (SOLU-MEDROL) injection  40 mg Intravenous Q12H  . pantoprazole  40 mg Oral Daily  . rosuvastatin  40 mg Oral Daily     . azithromycin Stopped (07/14/19 1910)    Assessment/Plan:   1.  Wenkebach  -Overall heart rate 70 to 90s.  Intermittent drop with above HIS wenkebach .  Avoid AV nodal drugs No indication for PPM  2.  CAD -No angina.  Continue aspirin and statin.  3.  COPD exacerbation -Per primary team  Will need SNF placement  Cardiology will sign off   Jenkins Rouge 07/15/2019, 8:43 AM

## 2019-07-15 NOTE — Progress Notes (Signed)
Inpatient Diabetes Program Recommendations  AACE/ADA: New Consensus Statement on Inpatient Glycemic Control (2015)  Target Ranges:  Prepandial:   less than 140 mg/dL      Peak postprandial:   less than 180 mg/dL (1-2 hours)      Critically ill patients:  140 - 180 mg/dL   Lab Results  Component Value Date   GLUCAP 208 (H) 07/14/2019   HGBA1C 7.8 (H) 07/14/2019    Review of Glycemic Control Results for GER, RINGENBERG (MRN [id number]) as of 07/15/2019 10:17  Ref. Range 07/08/2016 12:31 07/14/2019 02:00 07/14/2019 05:53 07/14/2019 19:42 07/14/2019 22:14  Glucose-Capillary Latest Ref Range: 70 - 99 mg/dL 177 (H) 198 (H) 231 (H) 198 (H) 208 (H)   Diabetes history: DM2 Outpatient Diabetes medications: Tresiba 26 units qd + Humalog 8 units ac lunch Current orders for Inpatient glycemic control: Novolog moderate correction tid + hs 0-5 units + Solumedrol 40 mg q 12 hrs.  Inpatient Diabetes Program Recommendations:   -Add 50% home basal dose = Lantus 13 units  -Add Novolog 2 units tid meal coverage if eats 50% if postprandials >200. Blood glucose 240 this am per lab value. RN to review current CBG.  Thank you, Nani Gasser. Braeson Rupe, RN, MSN, CDE  Diabetes Coordinator Inpatient Glycemic Control Team Team Pager [phone] (8am-5pm) 07/15/2019 10:20 AM

## 2019-07-16 DIAGNOSIS — J9601 Acute respiratory failure with hypoxia: Secondary | ICD-10-CM | POA: Diagnosis not present

## 2019-07-16 LAB — BASIC METABOLIC PANEL
Anion gap: 6 (ref 5–15)
BUN: 23 mg/dL (ref 8–23)
CO2: 29 mmol/L (ref 22–32)
Calcium: 8.5 mg/dL — ABNORMAL LOW (ref 8.9–10.3)
Chloride: 93 mmol/L — ABNORMAL LOW (ref 98–111)
Creatinine, Ser: 0.86 mg/dL (ref 0.61–1.24)
GFR calc Af Amer: >60 mL/min (ref >60)
GFR calc non Af Amer: >60 mL/min (ref >60)
Glucose, Bld: 164 mg/dL — ABNORMAL HIGH (ref 70–99)
Potassium: 5.2 mmol/L — ABNORMAL HIGH (ref 3.5–5.1)
Sodium: 128 mmol/L — ABNORMAL LOW (ref 135–145)

## 2019-07-16 LAB — GLUCOSE, CAPILLARY
Glucose-Capillary: 152 mg/dL — ABNORMAL HIGH (ref 70–99)
Glucose-Capillary: 208 mg/dL — ABNORMAL HIGH (ref 70–99)
Glucose-Capillary: 152 mg/dL — ABNORMAL HIGH (ref 70–99)
Glucose-Capillary: 149 mg/dL — ABNORMAL HIGH (ref 70–99)
Glucose-Capillary: 195 mg/dL — ABNORMAL HIGH (ref 70–99)

## 2019-07-16 NOTE — Progress Notes (Signed)
PROGRESS NOTE    Ernest Clark.  IZT:[id number] DOB: 1931/04/18 DOA: 07/13/2019 PCP: Hulan Fess, MD   Brief Narrative:  Patient is a 86 year old male with history of COPD not on home oxygen, hypertension, hyperlipidemia, coronary artery disease, diabetes type 2, peripheral artery disease, chronic hyponatremia, former tobacco/alcohol abuse presents to the emergency department with complaints of generalized weakness and also had a fall from his bed sustaining a skin tear on his right forearm.  Patient lives alone.  He has been increasingly weak and had trouble with ALDs.  He was admitted for the management of COPD exacerbation/hypoxic respiratory failure.  Palliative care also consulted  due to his persistent respiratory failure for possible goals of care.    Assessment & Plan:   Principal Problem:   Acute hypoxemic respiratory failure (HCC) Active Problems:   Coronary heart disease   Essential hypertension   Hyperlipidemia   Hyponatremia   Type 2 diabetes mellitus without complication, with long-term current use of insulin (HCC)   COPD with acute exacerbation (HCC)   Elevated troponin   COPD exacerbation (HCC)   Acute hypoxic respiratory failure/COPD exacerbation: Presented with rhonchi, wheezing, respiratory distress.  Started on IV steroids, Z-Pak, bronchodilators.  Currently on BiPAP. Chest x-ray on admission showed chronic emphysematous changes without pneumonia.Last follow-up chest x-ray showed increased interstitial densities bilaterally which may reflect mild edema or atypical infection superimposed on chronic lung disease. Palliative care also following regarding his poor prognosis, advanced age.  Heart block: EKG showed Wenckebach phenomena.  Cardiology was consulted.Cardiology recommended to avoid AV nodal blockers, no indication for pacemaker.  Hyponatremia: Currently sodium is stable at 128.  Says history of chronic hyponatremia.  Suspected SIADH.  Coronary artery  disease/peripheral artery disease: Continue aspirin, Crestor  Hypertension: Blood pressure stable.  Losartan on hold.  Type 2 diabetes mellitus: Continue sliding scale insulin.  Takes Tyler Aas at home.  Goals of care: Elderly with advanced COPD.  Palliative care consulted for goals of care.  DNR.  Debility/deconditioning: Most likely he will need skilled nursing facility on discharge.PT/OT evaluation requested  Twitching of extremities: Unknown etiology. CT head was negative for any acute intracranial abnormalities.  Patient is alert and oriented.  Right arm laceration: Managed  in the emergency department.  Continue pain management  Peripheral artery disease: Continue aspirin, statin          DVT prophylaxis:Lovenox Code Status: DNR Family Communication: None present at bedside Status is: Inpatient  Remains inpatient appropriate because:Hemodynamically unstable   Dispo: The patient is from: Home              Anticipated d/c is to: SNF              Anticipated d/c date is: 2-3 days              Patient currently is not medically stable to d/c.    Consultants: None  Procedures:None  Antimicrobials:  Anti-infectives (From admission, onward)   Start     Dose/Rate Route Frequency Ordered Stop   07/15/19 1000  azithromycin (ZITHROMAX) tablet 250 mg     Discontinue     250 mg Oral Daily 07/15/19 0948     07/14/19 1315  azithromycin (ZITHROMAX) 500 mg in sodium chloride 0.9 % 250 mL IVPB  Status:  Discontinued        500 mg 250 mL/hr over 60 Minutes Intravenous Every 24 hours 07/14/19 1303 07/15/19 0948      Subjective: Patient seen and examined at  the bedside this morning.  Hemodynamically stable.  On BiPAP.  He looked comfortable and denies any shortness of breath.  He is very hard of hearing.  We will try to wean off BiPAP.  Objective: Vitals:   07/16/19 0410 07/16/19 0726 07/16/19 0735 07/16/19 1100  BP:   135/68   Pulse:  78 83 90  Resp:  16 20   Temp: 97.7  F (36.5 C)  97.8 F (36.6 C)   TempSrc: Axillary  Axillary   SpO2:  97% 97% 99%  Weight:      Height:        Intake/Output Summary (Last 24 hours) at 07/16/2019 1106 Last data filed at 07/15/2019 1500 Gross per 24 hour  Intake --  Output 225 ml  Net -225 ml   Filed Weights   07/13/19 2208 07/14/19 2300 07/15/19 0441  Weight: 112 kg 106.4 kg 107.2 kg    Examination:  General exam: Deconditioned, debilitated elderly male HEENT: BiPAP Respiratory system: Faint expiratory wheezes  cardiovascular system: S1 & S2 heard, RRR. No JVD, murmurs, rubs, gallops or clicks. No pedal edema. Gastrointestinal system: Abdomen is distended, soft and nontender. No organomegaly or masses felt. Normal bowel sounds heard. Central nervous system: Alert and oriented. No focal neurological deficits. Extremities: No edema, no clubbing ,no cyanosis.  Chronic venous stasis changes on bilateral lower extremities Skin: Scattered ecchymosis, senile purpura,no  lesions or ulcers,no icterus ,no pallor   Data Reviewed: I have personally reviewed following labs and imaging studies  CBC: Recent Labs  Lab 07/13/19 2122 07/14/19 1324 07/15/19 0407  WBC 11.0* 9.3 11.5*  HGB 12.8* 11.9* 11.5*  HCT 39.8 38.3* 36.8*  MCV 89.0 90.5 91.8  PLT 244 225 716   Basic Metabolic Panel: Recent Labs  Lab 07/13/19 2122 07/14/19 1324 07/15/19 0407 07/15/19 1642 07/16/19 0451  NA 128*  --  128* 129* 128*  K 4.3  --  4.8 5.3* 5.2*  CL 93*  --  93* 94* 93*  CO2 28  --  27 28 29   GLUCOSE 129*  --  240* 167* 164*  BUN 16  --  22 23 23   CREATININE 0.74 0.95 1.02 0.98 0.86  CALCIUM 8.4*  --  8.9 8.7* 8.5*  MG 2.0  --   --   --   --    GFR: Estimated Creatinine Clearance: 73.2 mL/min (by C-G formula based on SCr of 0.86 mg/dL). Liver Function Tests: Recent Labs  Lab 07/13/19 2122 07/15/19 0407  AST 41 32  ALT 21 25  ALKPHOS 90 76  BILITOT 1.1 1.0  PROT 6.5 6.0*  ALBUMIN 3.4* 3.1*   No results for  input(s): LIPASE, AMYLASE in the last 168 hours. No results for input(s): AMMONIA in the last 168 hours. Coagulation Profile: Recent Labs  Lab 07/13/19 2122  INR 1.0   Cardiac Enzymes: No results for input(s): CKTOTAL, CKMB, CKMBINDEX, TROPONINI in the last 168 hours. BNP (last 3 results) No results for input(s): PROBNP in the last 8760 hours. HbA1C: Recent Labs    07/14/19 1336  HGBA1C 7.8*   CBG: Recent Labs  Lab 07/15/19 1029 07/15/19 1116 07/15/19 1603 07/15/19 2133 07/16/19 0734  GLUCAP 209* 296* 181* 166* 152*   Lipid Profile: No results for input(s): CHOL, HDL, LDLCALC, TRIG, CHOLHDL, LDLDIRECT in the last 72 hours. Thyroid Function Tests: Recent Labs    07/14/19 1324  TSH 4.424   Anemia Panel: No results for input(s): VITAMINB12, FOLATE, FERRITIN, TIBC, IRON, RETICCTPCT in  the last 72 hours. Sepsis Labs: Recent Labs  Lab 07/13/19 2122  LATICACIDVEN 1.1    Recent Results (from the past 240 hour(s))  Culture, blood (single) w Reflex to ID Panel     Status: None (Preliminary result)   Collection Time: 07/13/19  9:00 PM   Specimen: BLOOD LEFT FOREARM  Result Value Ref Range Status   Specimen Description   Final    BLOOD LEFT FOREARM Performed at [facility], [address] [place], [place] [address]    Special Requests   Final    BOTTLES DRAWN AEROBIC AND ANAEROBIC Blood Culture adequate volume Performed at [facility], [address] [place], [place] [address]    Culture   Final    NO GROWTH 2 DAYS Performed at [facility], [place] [address] [place], [place] [address]    Report Status PENDING  Incomplete  SARS Coronavirus 2 by RT PCR (hospital order, performed in [facility] hospital lab) Nasopharyngeal Nasopharyngeal Swab     Status: None   Collection Time: 07/13/19  9:47 PM   Specimen: Nasopharyngeal Swab  Result Value Ref Range Status   SARS Coronavirus 2 NEGATIVE NEGATIVE Final    Comment: (NOTE) SARS-CoV-2  target nucleic acids are NOT DETECTED.  The SARS-CoV-2 RNA is generally detectable in upper and lower respiratory specimens during the acute phase of infection. The lowest concentration of SARS-CoV-2 viral copies this assay can detect is 250 copies / mL. A negative result does not preclude SARS-CoV-2 infection and should not be used as the sole basis for treatment or other patient management decisions.  A negative result may occur with improper specimen collection / handling, submission of specimen other than nasopharyngeal swab, presence of viral mutation(s) within the areas targeted by this assay, and inadequate number of viral copies (<250 copies / mL). A negative result must be combined with clinical observations, patient history, and epidemiological information.  Fact Sheet for Patients:   StrictlyIdeas.no  Fact Sheet for Healthcare Providers: BankingDealers.co.za  This test is not yet approved or  cleared by the [place] FDA and has been authorized for detection and/or diagnosis of SARS-CoV-2 by FDA under an Emergency Use Authorization (EUA).  This EUA will remain in effect (meaning this test can be used) for the duration of the COVID-19 declaration under Section 564(b)(1) of the Act, 21 U.S.C. section 360bbb-3(b)(1), unless the authorization is terminated or revoked sooner.  Performed at [facility], [address] [place], [place] [address]          Radiology Studies: DG Chest [place] 1 View  Result Date: 07/15/2019 CLINICAL DATA:  Shortness of breath. EXAM: PORTABLE CHEST 1 VIEW COMPARISON:  07/13/2019 FINDINGS: The cardiomediastinal silhouette is unchanged with aortic tortuosity and atherosclerosis again noted. There is persistent pulmonary vascular congestion, and the interstitial markings are further increased diffusely compared to the prior study. No sizable pleural effusion or pneumothorax is  identified. IMPRESSION: Increased interstitial densities bilaterally which may reflect mild edema or atypical infection superimposed on chronic lung disease. Electronically Signed   By: Logan Bores M.D.   On: 07/15/2019 16:07        Scheduled Meds: . acetylcysteine  4 mL Nebulization BID  . allopurinol  100 mg Oral Daily  . aspirin EC  81 mg Oral Daily  . azithromycin  250 mg Oral Daily  . dextromethorphan-guaiFENesin  1 tablet Oral BID  . DULoxetine  30 mg Oral Daily  .  enoxaparin (LOVENOX) injection  40 mg Subcutaneous Q24H  . ferrous sulfate  325 mg Oral Daily  . insulin aspart  0-15 Units Subcutaneous TID WC  . insulin aspart  0-5 Units Subcutaneous QHS  . ipratropium-albuterol  3 mL Nebulization Q4H  . levothyroxine  75 mcg Oral Q0600  . methylPREDNISolone (SOLU-MEDROL) injection  40 mg Intravenous Q12H  . pantoprazole  40 mg Oral Daily  . rosuvastatin  40 mg Oral Daily   Continuous Infusions:   LOS: 1 day    Time spent: 25 mins.More than 50% of that time was spent in counseling and/or coordination of care.      Shelly Coss, MD [facility] P7/14/2021, 11:06 AM

## 2019-07-16 NOTE — Evaluation (Signed)
Physical Therapy Evaluation Patient Details Name: Ernest Clark. MRN: [id number] DOB: 13-Jun-1931 Today's Date: 07/16/2019   History of Present Illness  86 y.o. male with medical history significant of COPD-not on home oxygen, hypertension, hyperlipidemia, coronary artery disease, diabetes, peripheral arterial disease, chronic hyponatremia, former tobacco/alcohol abuse presents to emergency department due to generalized weakness and slid out of the bed 07/14/19 and incurred an approximately 3 inch skin tear to right forearm-which was repaired at [facility]. Pt transferred to [facility] ED for evaluation of bradycardia. Admitted 07/14/19 for COPD exacerbation   Clinical Impression  PTA pt living alone in single story home with 4 steps to enter. Pt son in room provides PLOF. Pt was limited in mobility ambulating from bed to bathroom, had stopped taking bath/shower despite son offering assist.Pt has caregivers bring food twice a day and assist with cleaning. Pt is currently limited in safe mobility by increased WoB. Pt is on 4L O2 via [place] on entry and able to maintain SaO2 in mid 90s however is utilizing accessory muscles and audibly wheezing with breathing. Pt also with decreased strength and exercise tolerance. Pt requires maxAx2 for bed mobility and total A to pivot to recliner. PT recommending SNF level rehab at discharge. Pt's son in interested in [place]. PT will continue to follow acutely.     Follow Up Recommendations SNF    Equipment Recommendations  Other (comment) (TBD at next venue)       Precautions / Restrictions Precautions Precautions: Fall Precaution Comments: fall prior to admission  Restrictions Weight Bearing Restrictions: No      Mobility  Bed Mobility Overal bed mobility: Needs Assistance Bed Mobility: Supine to Sit     Supine to sit: Max assist;+2 for physical assistance     General bed mobility comments: pt able to move bilateral LE off side of  bed, but requires maxAx2 for bringing trunk to upright, attempts to scoot hips across bed but needs pad scoot to get hips forward so feet are on floor  Transfers Overall transfer level: Needs assistance Equipment used: 2 person hand held assist Transfers: Sit to/from Stand Sit to Stand: Total assist;From elevated surface;+2 physical assistance Stand pivot transfers: Total assist;+2 physical assistance;From elevated surface       General transfer comment: total A for coming to upright, and pivot transfers to recliner, increased cuing to reach back to chair to assist in lowering into chair  Ambulation/Gait             General Gait Details: unable       Balance Overall balance assessment: Needs assistance Sitting-balance support: Feet supported;Bilateral upper extremity supported Sitting balance-Leahy Scale: Poor       Standing balance-Leahy Scale: Zero                               Pertinent Vitals/Pain Pain Assessment: Faces Faces Pain Scale: Hurts little more Pain Location: R forearm in location of stiches Pain Descriptors / Indicators: Grimacing;Guarding Pain Intervention(s): Limited activity within patient's tolerance;Monitored during session;Repositioned    Home Living Family/patient expects to be discharged to:: Private residence Living Arrangements: Alone Available Help at Discharge: Family;Available PRN/intermittently Type of Home: House Home Access: Stairs to enter Entrance Stairs-Rails: Can reach both Entrance Stairs-Number of Steps: 4 Home Layout: One level Home Equipment: Walker - 2 wheels Additional Comments: Dec 2019 started using RW    Prior Function Level of Independence: Needs  assistance   Gait / Transfers Assistance Needed: Son reports pt is in the bed mostly  ADL's / Homemaking Assistance Needed: Uses urinal, mostly bed bound; can walk to commod close by' medications/food taken care of by family or neighbors.        Hand  Dominance   Dominant Hand: Right    Extremity/Trunk Assessment   Upper Extremity Assessment Upper Extremity Assessment: Defer to OT evaluation    Lower Extremity Assessment Lower Extremity Assessment: Generalized weakness (difficult to command follow and increased fatigue)       Communication   Communication: HOH  Cognition Arousal/Alertness: Awake/alert Behavior During Therapy: WFL for tasks assessed/performed Overall Cognitive Status: Difficult to assess Area of Impairment: Orientation;Safety/judgement;Awareness;Following commands;Problem solving                 Orientation Level: Disoriented to;Situation     Following Commands: Follows multi-step commands with increased time;Follows one step commands with increased time Safety/Judgement: Decreased awareness of safety Awareness: Emergent Problem Solving: Slow processing;Requires verbal cues;Requires tactile cues General Comments: requires increased cuing due to difficulty hearing, but requires extra time for initation       General Comments General comments (skin integrity, edema, etc.): Pt on 4L O2 on [place], initially difficult keeping pleth waveform with finger probe, placed on earlobe and pt able to maintain SaO2 >92%O2, HR in mid 90s with movement         Assessment/Plan    PT Assessment Patient needs continued PT services  PT Problem List Decreased strength;Decreased activity tolerance;Decreased balance;Decreased mobility;Decreased cognition;Decreased coordination;Decreased safety awareness;Cardiopulmonary status limiting activity       PT Treatment Interventions DME instruction;Gait training;Functional mobility training;Therapeutic activities;Therapeutic exercise;Balance training;Cognitive remediation;Patient/family education    PT Goals (Current goals can be found in the Care Plan section)  Acute Rehab PT Goals Patient Stated Goal: none stated PT Goal Formulation: With patient/family Time For Goal  Achievement: 07/30/19 Potential to Achieve Goals: Good    Frequency Min 2X/week   Barriers to discharge Decreased caregiver support         AM-PAC PT "6 Clicks" Mobility  Outcome Measure Help needed turning from your back to your side while in a flat bed without using bedrails?: A Lot Help needed moving from lying on your back to sitting on the side of a flat bed without using bedrails?: Total Help needed moving to and from a bed to a chair (including a wheelchair)?: Total Help needed standing up from a chair using your arms (e.g., wheelchair or bedside chair)?: Total Help needed to walk in hospital room?: Total Help needed climbing 3-5 steps with a railing? : Total 6 Click Score: 7    End of Session Equipment Utilized During Treatment: Gait belt;Oxygen Activity Tolerance: Patient limited by fatigue Patient left: in chair;with call bell/phone within reach;with chair alarm set;with family/visitor present Nurse Communication: Mobility status PT Visit Diagnosis: Unsteadiness on feet (R26.81);Other abnormalities of gait and mobility (R26.89);Muscle weakness (generalized) (M62.81);Difficulty in walking, not elsewhere classified (R26.2);History of falling (Z91.81)    Time: 6295-2841 PT Time Calculation (min) (ACUTE ONLY): 35 min   Charges:   PT Evaluation $PT Eval Moderate Complexity: 1 Mod          Natascha Edmonds B. Migdalia Dk PT, DPT Acute Rehabilitation Services Pager [phone] Office [phone]   [place] 07/16/2019, 4:21 PM

## 2019-07-16 NOTE — Evaluation (Signed)
Occupational Therapy Evaluation Patient Details Name: Ernest Clark. MRN: [id number] DOB: 17-Aug-1931 Today's Date: 07/16/2019    History of Present Illness 86 y.o. male with medical history significant of COPD-not on home oxygen, hypertension, hyperlipidemia, coronary artery disease, diabetes, peripheral arterial disease, chronic hyponatremia, former tobacco/alcohol abuse presents to emergency department due to generalized weakness and slid out of the bed 07/14/19 and incurred an approximately 3 inch skin tear to right forearm-which was repaired at [facility] Pt transferred to [facility] ED for evaluation of bradycardia. Admitted 07/14/19 for COPD exacerbation    Clinical Impression   Pt PTA: Pt living alone, reports independence with ADL and iADLs provided for pt. Pt currently limited by decreased strength, decreased mobility, decreased safety, and decreased activity tolerance. Pt maxA+2 for bed mobility and transfers totalA+2 for stand pivot. Pt set-upA to [place] for ADL tasks. Pt sitting up for light grooming with set-upA. Pt extremely HOH wearing hearing aid only in L ear. Pt BP at rest 128/58, 5L O2 >90%, 89 BPM; BP after exertion: 133/59, 90 BPM , O2 >90% pt with noticeable wheezing. Pt would benefit from continued OT skilled services for ADL, mobility and safety. OT following acutely.      Follow Up Recommendations  SNF;Supervision/Assistance - 24 hour    Equipment Recommendations  Other (comment) (defer to next facility)    Recommendations for Other Services       Precautions / Restrictions Precautions Precautions: Fall Precaution Comments: fall prior to admission  Restrictions Weight Bearing Restrictions: No      Mobility Bed Mobility Overal bed mobility: Needs Assistance Bed Mobility: Supine to Sit     Supine to sit: Max assist;+2 for physical assistance     General bed mobility comments: Pt moving BLEs to EOB; Pt requiring maxA +2 for trunk and  sliding hips forward.  Transfers Overall transfer level: Needs assistance Equipment used: 2 person hand held assist Transfers: Sit to/from Stand Sit to Stand: Total assist;From elevated surface;+2 physical assistance Stand pivot transfers: Total assist;+2 physical assistance;From elevated surface       General transfer comment: totalA for stand pivot and guidance to recliner; pt able to slide feet to recliner.    Balance Overall balance assessment: Needs assistance Sitting-balance support: Feet supported;Bilateral upper extremity supported Sitting balance-Leahy Scale: Poor     Standing balance support: Bilateral upper extremity supported Standing balance-Leahy Scale: Zero                             ADL either performed or assessed with clinical judgement   ADL Overall ADL's : Needs assistance/impaired Eating/Feeding: Minimal assistance;Sitting   Grooming: Moderate assistance;Sitting   Upper Body Bathing: Moderate assistance;Sitting   Lower Body Bathing: Total assistance;+2 for physical assistance;+2 for safety/equipment;Sitting/lateral leans;Sit to/from stand;Cueing for safety   Upper Body Dressing : Moderate assistance;Sitting   Lower Body Dressing: Total assistance;+2 for physical assistance;+2 for safety/equipment;Sitting/lateral leans;Sit to/from stand   Toilet Transfer: Total assistance;+2 for physical assistance;Stand-pivot;BSC   Toileting- Clothing Manipulation and Hygiene: Total assistance;+2 for physical assistance;+2 for safety/equipment;Sitting/lateral lean;Sit to/from stand;Cueing for safety       Functional mobility during ADLs: Total assistance;+2 for physical assistance;+2 for safety/equipment;Cueing for sequencing;Cueing for safety General ADL Comments: Pt limited by decreased strength, decreased mobility, decreased safety, and decreased activity tolerance. Pt BP at rest 128/58, 5L O2 >90%, 89 BPM; BP after exertion: 133/59, 90 BPM , O2 >90%  on 5L pt  with noticeable wheezing.     Vision Baseline Vision/History: No visual deficits Patient Visual Report: No change from baseline Vision Assessment?: No apparent visual deficits     Perception     Praxis      Pertinent Vitals/Pain Pain Assessment: Faces Faces Pain Scale: Hurts little more Pain Location: R forearm in location of stiches Pain Descriptors / Indicators: Grimacing;Guarding Pain Intervention(s): Limited activity within patient's tolerance     Hand Dominance Right   Extremity/Trunk Assessment Upper Extremity Assessment Upper Extremity Assessment: Generalized weakness   Lower Extremity Assessment Lower Extremity Assessment: Generalized weakness   Cervical / Trunk Assessment Cervical / Trunk Assessment: Other exceptions Cervical / Trunk Exceptions: large body habitus   Communication Communication Communication: HOH   Cognition Arousal/Alertness: Awake/alert Behavior During Therapy: WFL for tasks assessed/performed Overall Cognitive Status: Difficult to assess Area of Impairment: Orientation;Safety/judgement;Awareness;Following commands;Problem solving                 Orientation Level: Disoriented to;Situation     Following Commands: Follows multi-step commands with increased time;Follows one step commands with increased time Safety/Judgement: Decreased awareness of safety Awareness: Emergent Problem Solving: Slow processing;Requires verbal cues;Requires tactile cues General Comments: requires increased cuing due to difficulty hearing, but requires extra time for initation    General Comments  Pt on 4L O2 on [place], initially difficult keeping pleth waveform with finger probe, placed on earlobe and pt able to maintain SaO2 >92%O2, HR in mid 90s with movement     Exercises     Shoulder Instructions      Home Living Family/patient expects to be discharged to:: Private residence Living Arrangements: Alone Available Help at Discharge:  Family;Available PRN/intermittently Type of Home: House Home Access: Stairs to enter [employer] of Steps: 4 Entrance Stairs-Rails: Can reach both Home Layout: One level     Bathroom Shower/Tub: Tub/shower unit;Walk-in shower   Bathroom Toilet: Standard     Home Equipment: Environmental consultant - 2 wheels   Additional Comments: Dec 2019 started using RW      Prior Functioning/Environment Level of Independence: Needs assistance  Gait / Transfers Assistance Needed: Son reports pt is in the bed mostly ADL's / Homemaking Assistance Needed: Uses urinal, mostly bed bound; can walk to commode close by; medications/food taken care of by family or neighbors.            OT Problem List: Decreased strength;Decreased activity tolerance;Impaired balance (sitting and/or standing);Decreased safety awareness;Pain;Decreased knowledge of use of DME or AE;Decreased coordination;Cardiopulmonary status limiting activity;Increased edema      OT Treatment/Interventions: Self-care/ADL training;Therapeutic exercise;Energy conservation;DME and/or AE instruction;Therapeutic activities;Patient/family education;Balance training    OT Goals(Current goals can be found in the care plan section) Acute Rehab OT Goals Patient Stated Goal: son reports he wants pt to go to rehab OT Goal Formulation: With patient/family Time For Goal Achievement: 07/30/19 Potential to Achieve Goals: Good ADL Goals Pt Will Perform Eating: (P) with set-up;sitting Pt Will Perform Grooming: (P) with supervision;sitting Pt Will Transfer to Toilet: (P) with max assist;stand pivot transfer;bedside commode Pt/caregiver will Perform Home Exercise Program: (P) Increased strength;Both right and left upper extremity;With written HEP provided Additional ADL Goal #1: (P) Pt will increase to minA for bed mobility to EOB as precursor for ADL tasks.  OT Frequency: Min 2X/week   Barriers to D/C: Decreased caregiver support           Co-evaluation PT/OT/SLP Co-Evaluation/Treatment: Yes Reason for Co-Treatment: Complexity of the patient's impairments (multi-system involvement)   OT goals  addressed during session: ADL's and self-care      AM-PAC OT "6 Clicks" Daily Activity     Outcome Measure Help from another person eating meals?: A Little Help from another person taking care of personal grooming?: A Little Help from another person toileting, which includes using toliet, bedpan, or urinal?: A Lot Help from another person bathing (including washing, rinsing, drying)?: A Lot Help from another person to put on and taking off regular upper body clothing?: A Little Help from another person to put on and taking off regular lower body clothing?: Total 6 Click Score: 14   End of Session Equipment Utilized During Treatment: Gait belt;Oxygen Nurse Communication: Mobility status;Other (comment) (need for new condom cath)  Activity Tolerance: Patient tolerated treatment well;Treatment limited secondary to medical complications (Comment);Patient limited by lethargy Patient left: in chair;with call bell/phone within reach;with chair alarm set  OT Visit Diagnosis: Unsteadiness on feet (R26.81);Muscle weakness (generalized) (M62.81)                Time: 1520-1600 OT Time Calculation (min): 40 min Charges:  OT General Charges $OT Visit: 1 Visit OT Evaluation $OT Eval Moderate Complexity: 1 Mod  Jefferey Pica, OTR/L Acute Rehabilitation Services Pager: [phone] Office: [phone]   Jefferey Pica 07/16/2019, 5:22 PM

## 2019-07-16 NOTE — Progress Notes (Signed)
PT Cancellation Note  Patient Details Name: Ernest Clark. MRN: [id number] DOB: 1931/04/04   Cancelled Treatment:    Reason Eval/Treat Not Completed: (P) Medical issues which prohibited therapy Pt continues to be on BiPAP. PT will follow back this afternoon to assess appropriateness of Evaluation.  Miyako Oelke B. Migdalia Dk PT, DPT Acute Rehabilitation Services Pager [phone] Office [phone]    [place] 07/16/2019, 11:33 AM

## 2019-07-16 NOTE — Evaluation (Deleted)
Occupational Therapy Evaluation Patient Details Name: Ernest Clark. MRN: [id number] DOB: 10-23-1931 Today's Date: 07/16/2019    History of Present Illness 86 y.o. male with medical history significant of COPD-not on home oxygen, hypertension, hyperlipidemia, coronary artery disease, diabetes, peripheral arterial disease, chronic hyponatremia, former tobacco/alcohol abuse presents to emergency department due to generalized weakness and slid out of the bed 07/14/19 and incurred an approximately 3 inch skin tear to right forearm-which was repaired at [facility]. Pt transferred to [facility] ED for evaluation of bradycardia. Admitted 07/14/19 for COPD exacerbation    Clinical Impression   Pt PTA: Pt living with s/o in single level home and reports independence with ADL and mobility. Pt currently performing all ADL tasks with walk-in shower transfer and commode transfer with supervisionA to modified independence. Pt very careful with mobility- no AD required, superivisonA only. Pt performing ADL at sink with no difficulty in standing. Pt education on energy conservation strategies and pt already performing most. O2 >90% on RA and HR 77-107 BPM with exertion. BP: 149/61 post exertion. Pt does not require continued OT skilled services as pt is at his current baseline for ADL and ADL functional mobility with controlled HR. OT signing off.       Follow Up Recommendations  No OT follow up    Equipment Recommendations  None recommended by OT    Recommendations for Other Services       Precautions / Restrictions Precautions Precautions: Other (comment) Precaution Comments: watch HR Restrictions Weight Bearing Restrictions: No      Mobility Bed Mobility               General bed mobility comments: in recliner pre and post session  Transfers Overall transfer level: Needs assistance Equipment used: None Transfers: Sit to/from Stand;Stand Pivot Transfers Sit to Stand:  Supervision Stand pivot transfers: Supervision       General transfer comment: Pt pushing own IV Pole.    Balance Overall balance assessment: No apparent balance deficits (not formally assessed)                                         ADL either performed or assessed with clinical judgement   ADL Overall ADL's : Modified independent                                       General ADL Comments: Pt performing all ADL tasks with walk-in shower transfer and commode transfer with no physical assist. Pt very careful with mobility- no AD req     Vision Baseline Vision/History: Wears glasses Wears Glasses: At all times Patient Visual Report: No change from baseline Vision Assessment?: No apparent visual deficits     Perception     Praxis      Pertinent Vitals/Pain Pain Assessment: No/denies pain     Hand Dominance Right   Extremity/Trunk Assessment Upper Extremity Assessment Upper Extremity Assessment: Overall WFL for tasks assessed   Lower Extremity Assessment Lower Extremity Assessment: Defer to PT evaluation;Overall [facility] for tasks assessed   Cervical / Trunk Assessment Cervical / Trunk Assessment: Normal   Communication Communication Communication: HOH;Other (comment) (if hearing aids are in, pt can hear very well)   Cognition Arousal/Alertness: Awake/alert Behavior During Therapy: WFL for tasks assessed/performed Overall Cognitive Status:  Within Functional Limits for tasks assessed                                     General Comments  Pt education on energy conservation strategies and pt already performing most. O2 >90% on RA and HR 77-107 BPM with exertion. BP: 149/61 post exertion.    Exercises     Shoulder Instructions      Home Living Family/patient expects to be discharged to:: Private residence Living Arrangements: Spouse/significant other Available Help at Discharge: Family;Available 24 hours/day Type  of Home: House Home Access: Level entry     Home Layout: One level     Bathroom Shower/Tub: Walk-in shower;Door   Bathroom Toilet: Handicapped height     Home Equipment: Environmental consultant - 2 wheels;Cane - single point;Grab bars - tub/shower;Grab bars - toilet;Shower seat - built in (Pt does not use any AD for mobility)          Prior Functioning/Environment Level of Independence: Independent with assistive device(s)        Comments: Pt reports independence with ADL and mobility; pt using shower tub bench for showers and was driving prior to dizziness.         OT Problem List: Decreased activity tolerance      OT Treatment/Interventions:      OT Goals(Current goals can be found in the care plan section) Acute Rehab OT Goals Patient Stated Goal: to go home after I am better OT Goal Formulation: All assessment and education complete, DC therapy  OT Frequency:     Barriers to D/C:            Co-evaluation              AM-PAC OT "6 Clicks" Daily Activity     Outcome Measure Help from another person eating meals?: None Help from another person taking care of personal grooming?: None Help from another person toileting, which includes using toliet, bedpan, or urinal?: None Help from another person bathing (including washing, rinsing, drying)?: None Help from another person to put on and taking off regular upper body clothing?: None Help from another person to put on and taking off regular lower body clothing?: None 6 Click Score: 24   End of Session Nurse Communication: Mobility status  Activity Tolerance: Patient tolerated treatment well Patient left: in chair;with call bell/phone within reach;with family/visitor present  OT Visit Diagnosis: Unsteadiness on feet (R26.81)                Time: 1030-1100 OT Time Calculation (min): 30 min Charges:  OT General Charges $OT Visit: 1 Visit OT Evaluation $OT Eval Moderate Complexity: 1 Mod OT Treatments $Self Care/Home  Management : 8-22 mins  Jefferey Pica, OTR/L Acute Rehabilitation Services Pager: [phone] Office: [phone]   Burris Matherne C 07/16/2019, 11:06 AM

## 2019-07-17 ENCOUNTER — Inpatient Hospital Stay (HOSPITAL_COMMUNITY): Payer: Medicare Other

## 2019-07-17 DIAGNOSIS — Z515 Encounter for palliative care: Secondary | ICD-10-CM

## 2019-07-17 DIAGNOSIS — J9601 Acute respiratory failure with hypoxia: Secondary | ICD-10-CM | POA: Diagnosis not present

## 2019-07-17 DIAGNOSIS — J441 Chronic obstructive pulmonary disease with (acute) exacerbation: Secondary | ICD-10-CM | POA: Diagnosis not present

## 2019-07-17 LAB — CBC WITH DIFFERENTIAL/PLATELET
Abs Immature Granulocytes: 0.12 10*3/uL — ABNORMAL HIGH (ref 0.00–0.07)
Basophils Absolute: 0 10*3/uL (ref 0.0–0.1)
Basophils Relative: 0 %
Eosinophils Absolute: 0 10*3/uL (ref 0.0–0.5)
Eosinophils Relative: 0 %
HCT: 39.2 % (ref 39.0–52.0)
Hemoglobin: 11.7 g/dL — ABNORMAL LOW (ref 13.0–17.0)
Immature Granulocytes: 1 %
Lymphocytes Relative: 4 %
Lymphs Abs: 0.4 10*3/uL — ABNORMAL LOW (ref 0.7–4.0)
MCH: 28.1 pg (ref 26.0–34.0)
MCHC: 29.8 g/dL — ABNORMAL LOW (ref 30.0–36.0)
MCV: 94 fL (ref 80.0–100.0)
Monocytes Absolute: 0.6 10*3/uL (ref 0.1–1.0)
Monocytes Relative: 5 %
Neutro Abs: 10.4 10*3/uL — ABNORMAL HIGH (ref 1.7–7.7)
Neutrophils Relative %: 90 %
Platelets: 258 10*3/uL (ref 150–400)
RBC: 4.17 MIL/uL — ABNORMAL LOW (ref 4.22–5.81)
RDW: 14.7 % (ref 11.5–15.5)
WBC: 11.5 10*3/uL — ABNORMAL HIGH (ref 4.0–10.5)
nRBC: 0 % (ref 0.0–0.2)

## 2019-07-17 LAB — BASIC METABOLIC PANEL
Anion gap: 7 (ref 5–15)
BUN: 24 mg/dL — ABNORMAL HIGH (ref 8–23)
CO2: 32 mmol/L (ref 22–32)
Calcium: 8.8 mg/dL — ABNORMAL LOW (ref 8.9–10.3)
Chloride: 93 mmol/L — ABNORMAL LOW (ref 98–111)
Creatinine, Ser: 0.94 mg/dL (ref 0.61–1.24)
GFR calc Af Amer: >60 mL/min (ref >60)
GFR calc non Af Amer: >60 mL/min (ref >60)
Glucose, Bld: 166 mg/dL — ABNORMAL HIGH (ref 70–99)
Potassium: 5.6 mmol/L — ABNORMAL HIGH (ref 3.5–5.1)
Sodium: 132 mmol/L — ABNORMAL LOW (ref 135–145)

## 2019-07-17 LAB — GLUCOSE, CAPILLARY
Glucose-Capillary: 172 mg/dL — ABNORMAL HIGH (ref 70–99)
Glucose-Capillary: 266 mg/dL — ABNORMAL HIGH (ref 70–99)
Glucose-Capillary: 237 mg/dL — ABNORMAL HIGH (ref 70–99)
Glucose-Capillary: 192 mg/dL — ABNORMAL HIGH (ref 70–99)
Glucose-Capillary: 188 mg/dL — ABNORMAL HIGH (ref 70–99)

## 2019-07-17 LAB — POTASSIUM: Potassium: 5.2 mmol/L — ABNORMAL HIGH (ref 3.5–5.1)

## 2019-07-17 MED ORDER — SODIUM ZIRCONIUM CYCLOSILICATE 10 G PO PACK
10.0000 g | PACK | Freq: Once | ORAL | Status: AC
  Administered 2019-07-17: 10 g via ORAL
  Filled 2019-07-17: qty 1

## 2019-07-17 MED ORDER — METHOCARBAMOL 500 MG PO TABS
500.0000 mg | ORAL_TABLET | Freq: Four times a day (QID) | ORAL | Status: DC | PRN
  Administered 2019-07-21: 500 mg via ORAL
  Filled 2019-07-17: qty 1

## 2019-07-17 MED ORDER — METHYLPREDNISOLONE SODIUM SUCC 125 MG IJ SOLR
60.0000 mg | Freq: Two times a day (BID) | INTRAMUSCULAR | Status: DC
  Administered 2019-07-17 – 2019-07-19 (×5): 60 mg via INTRAVENOUS
  Filled 2019-07-17 (×5): qty 2

## 2019-07-17 MED ORDER — FUROSEMIDE 10 MG/ML IJ SOLN
40.0000 mg | Freq: Once | INTRAMUSCULAR | Status: AC
  Administered 2019-07-17: 40 mg via INTRAVENOUS
  Filled 2019-07-17: qty 4

## 2019-07-17 MED ORDER — BUDESONIDE 0.25 MG/2ML IN SUSP
0.2500 mg | Freq: Two times a day (BID) | RESPIRATORY_TRACT | Status: DC
  Administered 2019-07-17 – 2019-07-22 (×11): 0.25 mg via RESPIRATORY_TRACT
  Filled 2019-07-17 (×11): qty 2

## 2019-07-17 MED ORDER — DM-GUAIFENESIN ER 30-600 MG PO TB12
2.0000 | ORAL_TABLET | Freq: Two times a day (BID) | ORAL | Status: DC
  Administered 2019-07-17 – 2019-07-22 (×10): 2 via ORAL
  Filled 2019-07-17 (×5): qty 2
  Filled 2019-07-17: qty 1
  Filled 2019-07-17 (×4): qty 2

## 2019-07-17 MED ORDER — IPRATROPIUM-ALBUTEROL 0.5-2.5 (3) MG/3ML IN SOLN
3.0000 mL | Freq: Four times a day (QID) | RESPIRATORY_TRACT | Status: DC
  Administered 2019-07-17 – 2019-07-19 (×8): 3 mL via RESPIRATORY_TRACT
  Filled 2019-07-17 (×8): qty 3

## 2019-07-17 NOTE — Consult Note (Signed)
Consultation Note Date: 07/17/2019   Patient Name: Ernest Clark.  DOB: 01-May-1931  MRN: [id number]  Age / Sex: 86 y.o., male  PCP: Hulan Fess, MD Referring Physician: Shelly Coss, MD  Reason for Consultation: Establishing goals of care  HPI/Patient Profile: 86 y.o. male  with past medical history of PAD, HTN/HLD, DVT, CAD, COPD, chronic hyponatremia, heart block, DM who was admitted on 07/13/2019 after he slid out of bed.  He was found to have a COPD exacerbation.  On 7/13 he was acidotic and hypercapnic with a CO2 of 82.3 and required Bipap.  He is currently on nasal canula.  Per notes he was able to ambulate from bed to bath using a walker at home, but has been needing increased assistance with ADLs.  In the hospital Physical Therapy notes Pt requires maxAx2 for bed mobility and total A to pivot to recliner.    Clinical Assessment and Goals of Care:  I have reviewed medical records including EPIC notes, labs and imaging, received report from the care team, examined the patient and met at bed  to discuss diagnosis prognosis, GOC, EOL wishes, disposition and options.  I introduced Palliative Medicine as specialized medical care for people living with serious illness. It focuses on providing relief from the symptoms and stress of a serious illness.   Ernest Clark is very hard of hearing.  I raise my voice and speak into his right ear.  He has a hearing aid in but still is unable to understand portions of what I say.  He tells me that he is feeling better, but has cramps in his legs.  He denies other difficulties or pain.  We talked for a short while about symptom management but he had difficulty hearing me.  I attempted to call his son Ernest Clark and left a voice mail message requesting a call back.  A MOST form would be helpful as the current plan is Mr. Hoque will DC to SNF.  I hope to speak  to his son about disease trajectory and what to expect.    Primary Decision Maker:  PATIENT.   Need to involve son due to significant hearing impairment.    SUMMARY OF RECOMMENDATIONS    Have left voice mail message for son.  PMT would like to meet with son and patient together to discuss patient's goals of care as well as anticipatory needs and disease trajectory  PMT will follow with you.  Code Status/Advance Care Planning:  DNR   Symptom Management:   Leg Cramps - will order SCDs and PRN robaxin.  Patient reports this is an old problem.  I suspect it is due to chronic electrolyte abnormalities.  Shortness of breath - per primary.  Additional Recommendations (Limitations, Scope, Preferences):  Full Scope Treatment  Palliative Prophylaxis:   Frequent assessment for SOB  Psycho-social/Spiritual:   Desire for further Chaplaincy support: not discussed.  Prognosis:  Unable to determine but less than 1 year would not be surprising.    Discharge  Planning: [place] for rehab with Palliative care service follow-up      Primary Diagnoses: Present on Admission: . COPD with acute exacerbation ([place]) . Essential hypertension . Hyperlipidemia . Hyponatremia . Coronary heart disease . COPD exacerbation ([place])   I have reviewed the medical record, interviewed the patient and family, and examined the patient. The following aspects are pertinent.  Past Medical History:  Diagnosis Date  . COPD (chronic obstructive pulmonary disease) ([place])   . Coronary heart disease    remote PCI  . Diabetes mellitus   . DVT (deep venous thrombosis) ([place]) 2011  . Gout   . Hyperlipidemia   . Hypertension   . Peripheral arterial disease ([place])    Social History   Socioeconomic History  . Marital status: Divorced    Spouse name: Not on file  . Number of children: 2  . Years of education: Not on file  . Highest education level: Not on file  Occupational History  .  Occupation: retired from [employer]  . Smoking status: Former Smoker    Packs/day: 2.00    Years: 60.00    Pack years: 120.00    Types: Cigarettes    Quit date: 01/02/2001    Years since quitting: 18.5  . Smokeless tobacco: Former Systems developer    Types: Chew  Substance and Sexual Activity  . Alcohol use: No  . Drug use: No  . Sexual activity: Not on file  Other Topics Concern  . Not on file  Social History Narrative  . Not on file   Social Determinants of Health   Financial Resource Strain:   . Difficulty of Paying Living Expenses:   Food Insecurity:   . Worried About Charity fundraiser in the Last Year:   . Arboriculturist in the Last Year:   Transportation Needs:   . Film/video editor (Medical):   Marland Kitchen Lack of Transportation (Non-Medical):   Physical Activity:   . Days of Exercise per Week:   . Minutes of Exercise per Session:   Stress:   . Feeling of Stress :   Social Connections:   . Frequency of Communication with Friends and Family:   . Frequency of Social Gatherings with Friends and Family:   . Attends Religious Services:   . Active Member of Clubs or Organizations:   . Attends Archivist Meetings:   Marland Kitchen Marital Status:    Family History  Problem Relation Age of Onset  . Diabetes Father   . Diabetes Mother   . Diabetes Brother   . Diabetes Brother     Allergies  Allergen Reactions  . Actos [Pioglitazone Hydrochloride] Other (See Comments)    RIGHT LEG SWELLING   . Metformin And Related Other (See Comments)    Reaction unknown      Physical Exam  Obese gentleman, pleasant, awake, alert, very hard of hearing (even with hearing aids) Resp upper airway exp wheeze, poor air movement noted in lungs Abdomen soft, nt, nd Ext with 1+ edema bilaterally.  Vital Signs: BP 121/62 (BP Location: Left Arm)   Pulse 80   Temp 98.4 F (36.9 C) (Axillary)   Resp 17   Ht _0  (1.727 m)   Wt 107.7 kg   SpO2 99%   BMI 36.10 kg/m  Pain Scale:  0-10   Pain Score: 0-No pain   SpO2: SpO2: 99 % O2 Device:SpO2: 99 % O2 Flow Rate: .O2 Flow Rate (L/min): 3 L/min  Palliative Assessment/Data: 40%     Time In: 11:00 Time Out: 11:50 Time Total: 50 min. Visit consisted of counseling and education dealing with the complex and emotionally intense issues surrounding the need for palliative care and symptom management in the setting of serious and potentially life-threatening illness. Greater than 50%  of this time was spent counseling and coordinating care related to the above assessment and plan.  Signed by: Florentina Jenny, PA-C Palliative Medicine  Please contact Palliative Medicine Team phone at [phone] for questions and concerns.  For individual provider: See Shea Evans

## 2019-07-17 NOTE — Progress Notes (Signed)
Hydrologist [facility])  Hospital Liaison: RN note         Notified by [facility] manager of patient/family request for [facility] Palliative services at home after discharge.         Writer left voicemail for son Joh to attempt to verify interest and explain services.               [place] Palliative team will follow up with patient after discharge.         Please call with any hospice or palliative related questions.         Thank you for this referral.         Domenic Moras, BSN, RN [place] (listed on [place] under Hospice[facility])    [phone]

## 2019-07-17 NOTE — Progress Notes (Addendum)
PROGRESS NOTE    Ernest Clark.  LGX:[id number] DOB: 28-Apr-1931 DOA: 07/13/2019 PCP: Hulan Fess, MD   Brief Narrative:  Patient is a 86 year old male with history of COPD not on home oxygen, hypertension, hyperlipidemia, coronary artery disease, diabetes type 2, peripheral artery disease, chronic hyponatremia, former tobacco/alcohol abuse presents to the emergency department with complaints of generalized weakness and also had a fall from his bed sustaining a skin tear on his right forearm.  Patient lives alone.  He has been increasingly weak and had trouble with ALDs.  He was admitted for the management of COPD exacerbation/hypoxic respiratory failure.  Palliative care also consulted  due to his persistent respiratory failure for possible goals of care.    Assessment & Plan:   Principal Problem:   Acute hypoxemic respiratory failure (HCC) Active Problems:   Coronary heart disease   Essential hypertension   Hyperlipidemia   Hyponatremia   Type 2 diabetes mellitus without complication, with long-term current use of insulin (HCC)   COPD with acute exacerbation (HCC)   Elevated troponin   COPD exacerbation (HCC)   Acute hypoxic respiratory failure/COPD exacerbation: Presented with rhonchi, wheezing, respiratory distress.  Started on IV steroids, Z-Pak, bronchodilators.  Currently on 3 L oxygen via nasal cannula.  He was on BiPAP till 07/16/2019. Chest x-ray on admission showed chronic emphysematous changes without pneumonia.Last follow-up chest x-ray showed increased interstitial densities bilaterally which may reflect mild edema or atypical infection superimposed on chronic lung disease. Palliative care also following regarding his poor prognosis, advanced age.  Heart block: EKG showed Wenckebach phenomena.  Cardiology was consulted.Cardiology recommended to avoid AV nodal blockers, no indication for pacemaker.  Hyponatremia: Currently sodium is stable at 132.  He has  history of  chronic hyponatremia.  Suspected SIADH.  Hyperkalemia: We will give him a dose of Lokelma today.  Will recheck potassium later today.  Coronary artery disease/peripheral artery disease: Continue aspirin, Crestor  Hypertension: Blood pressure stable.  Losartan on hold.  Type 2 diabetes mellitus: Continue sliding scale insulin.  Takes Tyler Aas at home.  Goals of care: Elderly with advanced COPD.  Palliative care consulted for goals of care.  DNR.  Debility/deconditioning:He will need skilled nursing facility on discharge.PT/OT evaluation done  Twitching of extremities: Unknown etiology. CT head was negative for any acute intracranial abnormalities.  Patient is alert and oriented.  Right arm laceration: Managed  in the emergency department.  Continue pain management  Peripheral artery disease: Continue aspirin, statin          DVT prophylaxis:Lovenox Code Status: DNR Family Communication:Son present at bedside Status is: Inpatient  Remains inpatient appropriate because:Hemodynamically unstable   Dispo: The patient is from: Home              Anticipated d/c is to: SNF              Anticipated d/c date is: 2-3 days              Patient currently is not medically stable to d/c.    Consultants: None  Procedures:None  Antimicrobials:  Anti-infectives (From admission, onward)   Start     Dose/Rate Route Frequency Ordered Stop   07/15/19 1000  azithromycin (ZITHROMAX) tablet 250 mg     Discontinue     250 mg Oral Daily 07/15/19 0948     07/14/19 1315  azithromycin (ZITHROMAX) 500 mg in sodium chloride 0.9 % 250 mL IVPB  Status:  Discontinued  500 mg 250 mL/hr over 60 Minutes Intravenous Every 24 hours 07/14/19 1303 07/15/19 0948      Subjective: Patient seen and examined at the bedside this morning.  Hemodynamically stable.  On 3 L of oxygen.  Son was at the bedside.  He is still having some wheezes and cough but needing less oxygen than  yesterday.  Objective: Vitals:   07/16/19 2034 07/17/19 0004 07/17/19 0335 07/17/19 0757  BP: 128/77 124/62 (!) 112/51   Pulse: 90 91 81   Resp: (!) 21 19 16    Temp: 97.8 F (36.6 C) (!) 97.5 F (36.4 C) 97.6 F (36.4 C)   TempSrc: Axillary Axillary Axillary   SpO2: 95% 100% 100% 100%  Weight:   107.7 kg   Height:        Intake/Output Summary (Last 24 hours) at 07/17/2019 0759 Last data filed at 07/16/2019 2200 Gross per 24 hour  Intake 120 ml  Output 800 ml  Net -680 ml   Filed Weights   07/14/19 2300 07/15/19 0441 07/17/19 0335  Weight: 106.4 kg 107.2 kg 107.7 kg    Examination:  General exam: Very elderly deconditioned, debilitated male, hard in hearing Respiratory system: Bilateral coarse breathing sounds,wheezes  cardiovascular system: S1 & S2 heard, RRR. No JVD, murmurs, rubs, gallops or clicks. Gastrointestinal system: Abdomen is nondistended, soft and nontender. No organomegaly or masses felt. Normal bowel sounds heard. Central nervous system: Alert and oriented. No focal neurological deficits. Extremities: No edema, no clubbing ,no cyanosis, chronic venous stasis changes in bilateral lower extremities Skin: Scattered ecchymosis, senile purpura, no ulcers   Data Reviewed: I have personally reviewed following labs and imaging studies  CBC: Recent Labs  Lab 07/13/19 2122 07/14/19 1324 07/15/19 0407  WBC 11.0* 9.3 11.5*  HGB 12.8* 11.9* 11.5*  HCT 39.8 38.3* 36.8*  MCV 89.0 90.5 91.8  PLT 244 225 175   Basic Metabolic Panel: Recent Labs  Lab 07/13/19 2122 07/14/19 1324 07/15/19 0407 07/15/19 1642 07/16/19 0451  NA 128*  --  128* 129* 128*  K 4.3  --  4.8 5.3* 5.2*  CL 93*  --  93* 94* 93*  CO2 28  --  27 28 29   GLUCOSE 129*  --  240* 167* 164*  BUN 16  --  22 23 23   CREATININE 0.74 0.95 1.02 0.98 0.86  CALCIUM 8.4*  --  8.9 8.7* 8.5*  MG 2.0  --   --   --   --    GFR: Estimated Creatinine Clearance: 73.3 mL/min (by C-G formula based on SCr  of 0.86 mg/dL). Liver Function Tests: Recent Labs  Lab 07/13/19 2122 07/15/19 0407  AST 41 32  ALT 21 25  ALKPHOS 90 76  BILITOT 1.1 1.0  PROT 6.5 6.0*  ALBUMIN 3.4* 3.1*   No results for input(s): LIPASE, AMYLASE in the last 168 hours. No results for input(s): AMMONIA in the last 168 hours. Coagulation Profile: Recent Labs  Lab 07/13/19 2122  INR 1.0   Cardiac Enzymes: No results for input(s): CKTOTAL, CKMB, CKMBINDEX, TROPONINI in the last 168 hours. BNP (last 3 results) No results for input(s): PROBNP in the last 8760 hours. HbA1C: Recent Labs    07/14/19 1336  HGBA1C 7.8*   CBG: Recent Labs  Lab 07/16/19 1127 07/16/19 1330 07/16/19 1621 07/16/19 2137 07/17/19 0753  GLUCAP 208* 152* 149* 195* 172*   Lipid Profile: No results for input(s): CHOL, HDL, LDLCALC, TRIG, CHOLHDL, LDLDIRECT in the last 72 hours. Thyroid  Function Tests: Recent Labs    07/14/19 1324  TSH 4.424   Anemia Panel: No results for input(s): VITAMINB12, FOLATE, FERRITIN, TIBC, IRON, RETICCTPCT in the last 72 hours. Sepsis Labs: Recent Labs  Lab 07/13/19 2122  LATICACIDVEN 1.1    Recent Results (from the past 240 hour(s))  Culture, blood (single) w Reflex to ID Panel     Status: None (Preliminary result)   Collection Time: 07/13/19  9:00 PM   Specimen: BLOOD LEFT FOREARM  Result Value Ref Range Status   Specimen Description   Final    BLOOD LEFT FOREARM Performed at [facility], [address] [place], [place] [address]    Special Requests   Final    BOTTLES DRAWN AEROBIC AND ANAEROBIC Blood Culture adequate volume Performed at [facility], [address] [place], [place] [address]    Culture   Final    NO GROWTH 2 DAYS Performed at [facility], [place] [address], [place], [place] [address]    Report Status PENDING  Incomplete  SARS Coronavirus 2 by RT PCR (hospital order, performed in [facility] hospital lab) Nasopharyngeal Nasopharyngeal  Swab     Status: None   Collection Time: 07/13/19  9:47 PM   Specimen: Nasopharyngeal Swab  Result Value Ref Range Status   SARS Coronavirus 2 NEGATIVE NEGATIVE Final    Comment: (NOTE) SARS-CoV-2 target nucleic acids are NOT DETECTED.  The SARS-CoV-2 RNA is generally detectable in upper and lower respiratory specimens during the acute phase of infection. The lowest concentration of SARS-CoV-2 viral copies this assay can detect is 250 copies / mL. A negative result does not preclude SARS-CoV-2 infection and should not be used as the sole basis for treatment or other patient management decisions.  A negative result may occur with improper specimen collection / handling, submission of specimen other than nasopharyngeal swab, presence of viral mutation(s) within the areas targeted by this assay, and inadequate number of viral copies (<250 copies / mL). A negative result must be combined with clinical observations, patient history, and epidemiological information.  Fact Sheet for Patients:   StrictlyIdeas.no  Fact Sheet for Healthcare Providers: BankingDealers.co.za  This test is not yet approved or  cleared by the [place] FDA and has been authorized for detection and/or diagnosis of SARS-CoV-2 by FDA under an Emergency Use Authorization (EUA).  This EUA will remain in effect (meaning this test can be used) for the duration of the COVID-19 declaration under Section 564(b)(1) of the Act, 21 U.S.C. section 360bbb-3(b)(1), unless the authorization is terminated or revoked sooner.  Performed at [facility], [address], [place], [place] [address]          Radiology Studies: DG Chest Sneads 1 View  Result Date: 07/15/2019 CLINICAL DATA:  Shortness of breath. EXAM: PORTABLE CHEST 1 VIEW COMPARISON:  07/13/2019 FINDINGS: The cardiomediastinal silhouette is unchanged with aortic tortuosity and atherosclerosis again  noted. There is persistent pulmonary vascular congestion, and the interstitial markings are further increased diffusely compared to the prior study. No sizable pleural effusion or pneumothorax is identified. IMPRESSION: Increased interstitial densities bilaterally which may reflect mild edema or atypical infection superimposed on chronic lung disease. Electronically Signed   By: Logan Bores M.D.   On: 07/15/2019 16:07        Scheduled Meds: . allopurinol  100 mg Oral Daily  . aspirin EC  81 mg Oral Daily  . azithromycin  250  mg Oral Daily  . dextromethorphan-guaiFENesin  1 tablet Oral BID  . DULoxetine  30 mg Oral Daily  . enoxaparin (LOVENOX) injection  40 mg Subcutaneous Q24H  . ferrous sulfate  325 mg Oral Daily  . insulin aspart  0-15 Units Subcutaneous TID WC  . insulin aspart  0-5 Units Subcutaneous QHS  . ipratropium-albuterol  3 mL Nebulization QID  . levothyroxine  75 mcg Oral Q0600  . methylPREDNISolone (SOLU-MEDROL) injection  40 mg Intravenous Q12H  . pantoprazole  40 mg Oral Daily  . rosuvastatin  40 mg Oral Daily   Continuous Infusions:   LOS: 2 days    Time spent: 25 mins.More than 50% of that time was spent in counseling and/or coordination of care.      Shelly Coss, MD [facility] P7/15/2021, 7:59 AM

## 2019-07-17 NOTE — NC FL2 (Signed)
[place] LEVEL OF CARE SCREENING TOOL     IDENTIFICATION  Patient Name: Ernest Clark. Birthdate: Jul 11, 1931 Sex: male Admission Date (Current Location): 07/13/2019  [facility] and [place] Number:  Herbalist and Address:  [facility], [place] [address] [place], [place] [address]      Provider Number: [id number]  Attending Physician Name and Address:  Shelly Coss, MD  Relative Name and Phone Number:  Iain [phone]    Current Level of Care: Hospital Recommended Level of Care: [place] Prior Approval Number:    Date Approved/Denied:   PASRR Number: [id number]  Discharge Plan: SNF    Current Diagnoses: Patient Active Problem List   Diagnosis Date Noted  . Palliative care encounter   . COPD exacerbation ([place]) 07/15/2019  . Elevated troponin   . Acute hypoxemic respiratory failure ([place])   . COPD with acute exacerbation ([place]) 07/13/2019  . Hav (hallux abducto valgus), left 04/15/2019  . Hav (hallux abducto valgus), right 04/15/2019  . Nail, injury by, initial encounter 10/15/2018  . Pain due to onychomycosis of toenails of both feet 07/09/2018  . Type 2 diabetes mellitus with vascular disease ([place]) 07/09/2018  . Allergic rhinitis 03/08/2017  . Solitary pulmonary nodule 03/08/2017  . Hyponatremia 07/04/2016  . Type 2 diabetes mellitus without complication, with long-term current use of insulin ([place]) 07/04/2016  . Multiple rib fractures 07/04/2016  . Peripheral arterial disease ([place]) 09/25/2013  . Coronary heart disease 09/25/2013  . Essential hypertension 09/25/2013  . Hyperlipidemia 09/25/2013  . COPD (chronic obstructive pulmonary disease) ([place]) 12/09/2010    Orientation RESPIRATION BLADDER Height & Weight     Self, Situation, Place (Appropriate at baseline memory impairment)  O2 (Nasal Cannula 4 liters) External catheter, Incontinent (External Urinary Catheter) Weight: 237 lb 6.4 oz (107.7  kg) Height:  5\' 8"  (172.7 cm)  BEHAVIORAL SYMPTOMS/MOOD NEUROLOGICAL BOWEL NUTRITION STATUS      Continent Diet (See Discharge Summary)  AMBULATORY STATUS COMMUNICATION OF NEEDS Skin   Extensive Assist Verbally Other (Comment) (Appropriate for ethnicity,Dry, MASD, Skin Tear, moisture abdomen right skin tear arm right non-tenting)                       Personal Care Assistance Level of Assistance  Bathing, Feeding, Dressing Bathing Assistance: Maximum assistance Feeding assistance: Maximum assistance Dressing Assistance: Maximum assistance     Functional Limitations Info  Sight, Hearing, Speech Sight Info: Adequate Hearing Info: Impaired Speech Info: Adequate    SPECIAL CARE FACTORS FREQUENCY  PT (By licensed PT), OT (By licensed OT)     PT Frequency: 5x min weekly OT Frequency: 5x min weekly            Contractures Contractures Info: Not present    Additional Factors Info  Code Status, Allergies Code Status Info: DNR Allergies Info: Actos,Metformin And Related           Current Medications (07/17/2019):  This is the current hospital active medication list Current Facility-Administered Medications  Medication Dose Route Frequency Provider Last Rate Last Admin  . acetaminophen (TYLENOL) tablet 650 mg  650 mg Oral Q6H PRN Pahwani, Rinka R, MD       Or  . acetaminophen (TYLENOL) suppository 650 mg  650 mg Rectal Q6H PRN Pahwani, Rinka R, MD      . albuterol (PROVENTIL) (2.5 MG/3ML) 0.083% nebulizer solution 2.5 mg  2.5 mg Nebulization Q4H PRN Pahwani, Michell Heinrich, MD  2.5 mg at 07/17/19 1444  . allopurinol (ZYLOPRIM) tablet 100 mg  100 mg Oral Daily Pahwani, Rinka R, MD   100 mg at 07/17/19 1114  . aspirin EC tablet 81 mg  81 mg Oral Daily Pahwani, Rinka R, MD   81 mg at 07/17/19 1114  . azithromycin (ZITHROMAX) tablet 250 mg  250 mg Oral Daily Debbe Odea, MD   250 mg at 07/17/19 1114  . budesonide (PULMICORT) nebulizer solution 0.25 mg  0.25 mg Nebulization BID  Shelly Coss, MD   0.25 mg at 07/17/19 1209  . dextromethorphan-guaiFENesin (MUCINEX DM) 30-600 MG per 12 hr tablet 2 tablet  2 tablet Oral BID Adhikari, Amrit, MD      . DULoxetine (CYMBALTA) DR capsule 30 mg  30 mg Oral Daily Pahwani, Rinka R, MD   30 mg at 07/17/19 1113  . enoxaparin (LOVENOX) injection 40 mg  40 mg Subcutaneous Q24H Pahwani, Rinka R, MD   40 mg at 07/17/19 1246  . ferrous sulfate tablet 325 mg  325 mg Oral Daily Pahwani, Rinka R, MD   325 mg at 07/17/19 1115  . insulin aspart (novoLOG) injection 0-15 Units  0-15 Units Subcutaneous TID WC Pahwani, Rinka R, MD   8 Units at 07/17/19 1245  . insulin aspart (novoLOG) injection 0-5 Units  0-5 Units Subcutaneous QHS Pahwani, Rinka R, MD   5 Units at 07/14/19 2222  . ipratropium-albuterol (DUONEB) 0.5-2.5 (3) MG/3ML nebulizer solution 3 mL  3 mL Nebulization QID Shelly Coss, MD   3 mL at 07/17/19 1209  . levothyroxine (SYNTHROID) tablet 75 mcg  75 mcg Oral Q0600 Pahwani, Rinka R, MD   75 mcg at 07/17/19 0526  . methocarbamol (ROBAXIN) tablet 500 mg  500 mg Oral Q6H PRN Dellinger, Haynes Dage L, PA-C      . methylPREDNISolone sodium succinate (SOLU-MEDROL) 125 mg/2 mL injection 60 mg  60 mg Intravenous Q12H Shelly Coss, MD   60 mg at 07/17/19 1245  . morphine 2 MG/ML injection 2 mg  2 mg Intravenous Q2H PRN Pahwani, Rinka R, MD   2 mg at 07/17/19 0131  . oxyCODONE (Oxy IR/ROXICODONE) immediate release tablet 5 mg  5 mg Oral Q4H PRN Pahwani, Rinka R, MD   5 mg at 07/17/19 0003  . pantoprazole (PROTONIX) EC tablet 40 mg  40 mg Oral Daily Pahwani, Rinka R, MD   40 mg at 07/17/19 1114  . rosuvastatin (CRESTOR) tablet 40 mg  40 mg Oral Daily Pahwani, Rinka R, MD   40 mg at 07/17/19 1115  . sodium zirconium cyclosilicate (LOKELMA) packet 10 g  10 g Oral Once Shelly Coss, MD         Discharge Medications: Please see discharge summary for a list of discharge medications.  Relevant Imaging Results:  Relevant Lab  Results:   Additional Information [phone]  Trula Ore, LCSWA

## 2019-07-17 NOTE — Progress Notes (Signed)
Pt has Bipap order PRN.  Pt is on 3L [place], Sp02 is 100%.  No distress noted.  RT will continue to monitor.

## 2019-07-17 NOTE — Progress Notes (Signed)
Patient with intermittent  productive cough noted,    yankauer utilized  to clear mouth , noted bloody secretions x2, ,[place] PA(on call) notified, no new order, Continue to monitor patient.

## 2019-07-17 NOTE — TOC Initial Note (Addendum)
Transition of Care (TOC) - Initial/Assessment Note    Patient Details  Name: Ernest Clark. MRN: [id number] Date of Birth: [birth year]  Transition of Care [facility]) CM/SW Contact:    Trula Ore, [place] Phone Number: 07/17/2019, 3:23 PM  Clinical Narrative:                  CSW spoke with Patients son Esther who is agreeable to SNF placement for patient. Shloime is agreeable to [facility] faxing out initial referral to [place] area. CSW will follow up with patients son Kariem tomorrow with SNF bed offers. CSW called Audrea Muscat with [facility] about palliative referral for Palliative services to possibly [facility] at [facility]. Audrea Muscat told CSW she will reach out to patients son Maribel.  CSW will continue to follow.  Pending bed offers.  CSW will continue to follow.  Expected Discharge Plan: Skilled Nursing Facility Barriers to Discharge: Continued Medical Work up   Patient Goals and CMS Choice Patient states their goals for this hospitalization and ongoing recovery are:: to go to SNF CMS Medicare.gov Compare Post Acute Care list provided to:: Patient Represenative (must comment) (Son Andrei) Choice offered to / list presented to : Adult Children Jenny Reichmann)  Expected Discharge Plan and Services Expected Discharge Plan: [place]       Living arrangements for the past 2 months: Single Family Home                                      Prior Living Arrangements/Services Living arrangements for the past 2 months: Single Family Home Lives with:: Self Patient language and need for interpreter reviewed:: Yes Do you feel safe going back to the place where you live?: No   SNF  Need for Family Participation in Patient Care: Yes (Comment) Care giver support system in place?: Yes (comment)   Criminal Activity/Legal Involvement Pertinent to Current Situation/Hospitalization: No - Comment as needed  Activities of Daily Living      Permission Sought/Granted Permission  sought to share information with : Case Manager, Family Supports, Customer service manager Permission granted to share information with : No (Patient fluctuating orientation)  Share Information with NAME: Keahi  Permission granted to share info w AGENCY: SNF  Permission granted to share info w Relationship: Son  Permission granted to share info w Contact Information: [place] [phone]  Emotional Assessment Appearance:: Appears stated age     Orientation: : Fluctuating Orientation (Suspected and/or reported [place]) Alcohol / Substance Use: Not Applicable Psych Involvement: No (comment)  Admission diagnosis:  Hypoxia [R09.02] COPD exacerbation ([place]) [J44.1] AV block, [place] 1 [I44.1] COPD with acute exacerbation ([place]) [J44.1] Patient Active Problem List   Diagnosis Date Noted  . Palliative care encounter   . COPD exacerbation ([place]) 07/15/2019  . Elevated troponin   . Acute hypoxemic respiratory failure ([place])   . COPD with acute exacerbation ([place]) 07/13/2019  . Hav (hallux abducto valgus), left 04/15/2019  . Hav (hallux abducto valgus), right 04/15/2019  . Nail, injury by, initial encounter 10/15/2018  . Pain due to onychomycosis of toenails of both feet 07/09/2018  . Type 2 diabetes mellitus with vascular disease ([place]) 07/09/2018  . Allergic rhinitis 03/08/2017  . Solitary pulmonary nodule 03/08/2017  . Hyponatremia 07/04/2016  . Type 2 diabetes mellitus without complication, with long-term current use of insulin ([place]) 07/04/2016  . Multiple rib fractures 07/04/2016  . Peripheral arterial  disease ([place]) 09/25/2013  . Coronary heart disease 09/25/2013  . Essential hypertension 09/25/2013  . Hyperlipidemia 09/25/2013  . COPD (chronic obstructive pulmonary disease) ([place]) 12/09/2010   PCP:  Hulan Fess, MD Pharmacy:   [facility] [place] [place] - [address] [place] [address] Phone: [phone] Fax:  [place], [place] - [place] [address] [place] [address] Phone: [phone] Fax: [place], [place] [address] Phone: [phone] Fax: [phone]  [facility] [place], [address]  [address] Phone: [phone] Fax: [phone]     Social Determinants of Health (SDOH) Interventions    Readmission Risk Interventions No flowsheet data found.

## 2019-07-18 DIAGNOSIS — J9601 Acute respiratory failure with hypoxia: Secondary | ICD-10-CM | POA: Diagnosis not present

## 2019-07-18 LAB — BASIC METABOLIC PANEL
Anion gap: 8 (ref 5–15)
BUN: 23 mg/dL (ref 8–23)
CO2: 36 mmol/L — ABNORMAL HIGH (ref 22–32)
Calcium: 8.7 mg/dL — ABNORMAL LOW (ref 8.9–10.3)
Chloride: 89 mmol/L — ABNORMAL LOW (ref 98–111)
Creatinine, Ser: 0.95 mg/dL (ref 0.61–1.24)
GFR calc Af Amer: >60 mL/min (ref >60)
GFR calc non Af Amer: >60 mL/min (ref >60)
Glucose, Bld: 163 mg/dL — ABNORMAL HIGH (ref 70–99)
Potassium: 4.3 mmol/L (ref 3.5–5.1)
Sodium: 133 mmol/L — ABNORMAL LOW (ref 135–145)

## 2019-07-18 LAB — CBC WITH DIFFERENTIAL/PLATELET
Abs Immature Granulocytes: 0.06 10*3/uL (ref 0.00–0.07)
Basophils Absolute: 0 10*3/uL (ref 0.0–0.1)
Basophils Relative: 0 %
Eosinophils Absolute: 0 10*3/uL (ref 0.0–0.5)
Eosinophils Relative: 0 %
HCT: 36.5 % — ABNORMAL LOW (ref 39.0–52.0)
Hemoglobin: 11.4 g/dL — ABNORMAL LOW (ref 13.0–17.0)
Immature Granulocytes: 1 %
Lymphocytes Relative: 6 %
Lymphs Abs: 0.6 10*3/uL — ABNORMAL LOW (ref 0.7–4.0)
MCH: 28.9 pg (ref 26.0–34.0)
MCHC: 31.2 g/dL (ref 30.0–36.0)
MCV: 92.6 fL (ref 80.0–100.0)
Monocytes Absolute: 1.1 10*3/uL — ABNORMAL HIGH (ref 0.1–1.0)
Monocytes Relative: 11 %
Neutro Abs: 8.3 10*3/uL — ABNORMAL HIGH (ref 1.7–7.7)
Neutrophils Relative %: 82 %
Platelets: 235 10*3/uL (ref 150–400)
RBC: 3.94 MIL/uL — ABNORMAL LOW (ref 4.22–5.81)
RDW: 14.6 % (ref 11.5–15.5)
WBC: 10.1 10*3/uL (ref 4.0–10.5)
nRBC: 0 % (ref 0.0–0.2)

## 2019-07-18 LAB — GLUCOSE, CAPILLARY
Glucose-Capillary: 201 mg/dL — ABNORMAL HIGH (ref 70–99)
Glucose-Capillary: 182 mg/dL — ABNORMAL HIGH (ref 70–99)
Glucose-Capillary: 183 mg/dL — ABNORMAL HIGH (ref 70–99)
Glucose-Capillary: 206 mg/dL — ABNORMAL HIGH (ref 70–99)

## 2019-07-18 NOTE — Progress Notes (Signed)
Manufacturing engineer [facility])   This RN left another voicemail for pt's son Kaileb to initiate education regarding outpatient palliative care services.  Contact number left for son.  The hospital liaison team will follow throughout admission, and the palliative team will follow up with patient after discharge.  Thank you for the opportunity to participate in this patient's care.  Domenic Moras, BSN, RN [employer] [phone] (24h on call)

## 2019-07-18 NOTE — Progress Notes (Signed)
Appreciate [facility] Palliative Team follow up after discharge.  Patient to DC to SNF.  [facility] PMT will sign off.   Please call us back if we can be of assistance.   Florentina Jenny, PA-C Palliative Medicine Office:  [phone]

## 2019-07-18 NOTE — TOC Progression Note (Addendum)
Transition of Care [facility]) - Progression Note    Patient Details  Name: Ernest Clark. MRN: [id number] Date of Birth: 1931/04/17  Transition of Care [facility]) CM/SW [place], [place] Phone Number: 07/18/2019, 3:07 PM  Clinical Narrative:     CSW spoke with patients son Jaimere by phone. Patients son Mayfield chose SNF bed for patient with [facility]. CSW called [facility] to confirm SNF choice. Facility said they could accept patient for SNF placement. Facility wants patients son to call them about some questions they have about patient on whether or not son may want patient to be long term placement after short term rehab. Patients son is going to follow up with facility about placement. Patients son will follow up with CSW to confirm facility choice after speaking with [facility].   CSW awaiting callback to confrim SNF choice.    Expected Discharge Plan: [place] Barriers to Discharge: Continued Medical Work up  Expected Discharge Plan and Services Expected Discharge Plan: [place] arrangements for the past 2 months: Single Family Home                                       Social Determinants of Health (SDOH) Interventions    Readmission Risk Interventions No flowsheet data found.

## 2019-07-18 NOTE — Progress Notes (Signed)
Patient continues to cough up small amount of bloody sputum.  Noted to be picking at nose. Humidification added to oxygen to prevent drying. Will continue to monitor.

## 2019-07-18 NOTE — Progress Notes (Signed)
Patient resting comfortably on 2L [place] with no respiratory distress noted. BIPAP not needed at this time. RT will monitor as needed. 

## 2019-07-18 NOTE — Care Management Important Message (Signed)
Important Message  Patient Details  Name: Ernest Clark. MRN: [id number] Date of Birth: Sep 26, 1931   Medicare Important Message Given:  Yes     Shelda Altes 07/18/2019, 11:44 AM

## 2019-07-18 NOTE — Progress Notes (Signed)
PROGRESS NOTE    Ernest Aus.  DTO:[id number] DOB: February 14, 1931 DOA: 07/13/2019 PCP: Hulan Fess, MD   Brief Narrative:  Patient is a 86 year old male with history of COPD not on home oxygen, hypertension, hyperlipidemia, coronary artery disease, diabetes type 2, peripheral artery disease, chronic hyponatremia, former tobacco/alcohol abuse presents to the emergency department with complaints of generalized weakness and also had a fall from his bed sustaining a skin tear on his right forearm.  Patient lives alone.  He has been increasingly weak and had trouble with ALDs.  He was admitted for the management of COPD exacerbation/hypoxic respiratory failure.  Palliative care also consulted  due to his persistent respiratory failure for possible goals of care.  Respiratory status is slowly improving.  Plan is to discharge him to skilled nursing facility when he is medically ready.  Assessment & Plan:   Principal Problem:   Acute hypoxemic respiratory failure (HCC) Active Problems:   Coronary heart disease   Essential hypertension   Hyperlipidemia   Hyponatremia   Type 2 diabetes mellitus without complication, with long-term current use of insulin (HCC)   COPD with acute exacerbation (HCC)   Elevated troponin   COPD exacerbation (HCC)   Palliative care encounter   Acute hypoxic respiratory failure/COPD exacerbation: Presented with rhonchi, wheezing, respiratory distress.  Started on IV steroids, Z-Pak, bronchodilators.  Currently on 3 L oxygen via nasal cannula.  He was on BiPAP till 07/16/2019. Chest x-ray on admission showed chronic emphysematous changes without pneumonia.Last follow-up chest x-ray showed increased interstitial densities bilaterally which may reflect mild edema or atypical infection superimposed on chronic lung disease. Palliative care also following regarding his poor prognosis, advanced age. Chest x-ray follow-up showed interstitial edema/small pleural effusions,  given a dose of Lasix IV.  Heart block: EKG showed Wenckebach phenomena.  Cardiology was consulted.Cardiology recommended to avoid AV nodal blockers, no indication for pacemaker.  Hyponatremia: Currently sodium is stable at 130s.  He has  history of chronic hyponatremia.    Hyperkalemia: Resolved with lokelma  Coronary artery disease/peripheral artery disease: Continue aspirin, Crestor  Hypertension: Blood pressure stable.  Losartan on hold.  Type 2 diabetes mellitus: Continue sliding scale insulin.  Takes Tyler Aas at home.  Goals of care: Elderly with advanced COPD.  Palliative care consulted for goals of care.  DNR.  Debility/deconditioning:He will need skilled nursing facility on discharge.PT/OT evaluation done  Twitching of extremities: Unknown etiology. CT head was negative for any acute intracranial abnormalities.  Patient is alert and oriented.  Right arm laceration: Managed  in the emergency department.  Continue pain management  Peripheral artery disease: Continue aspirin, statin          DVT prophylaxis:Lovenox Code Status: DNR Family Communication:Son at  Bedside on 07/17/19 Status is: Inpatient  Remains inpatient appropriate because:Hemodynamically unstable   Dispo: The patient is from: Home              Anticipated d/c is to: SNF              Anticipated d/c date is: 2-3 days              Patient currently is not medically stable to d/c. Still not ready for discharge from respiratory prespective, needing oxygen and IV steroids   Consultants: None  Procedures:None  Antimicrobials:  Anti-infectives (From admission, onward)   Start     Dose/Rate Route Frequency Ordered Stop   07/15/19 1000  azithromycin (ZITHROMAX) tablet 250 mg     Discontinue  250 mg Oral Daily 07/15/19 0948     07/14/19 1315  azithromycin (ZITHROMAX) 500 mg in sodium chloride 0.9 % 250 mL IVPB  Status:  Discontinued        500 mg 250 mL/hr over 60 Minutes Intravenous Every 24  hours 07/14/19 1303 07/15/19 0948      Subjective: Patient seen and examined at the bedside this morning.  Looks much better than yesterday.  Still needing 3 L of oxygen per minute but his wheezes have improved.  Not dyspneic at rest, coughing less  Objective: Vitals:   07/18/19 0051 07/18/19 0231 07/18/19 0431 07/18/19 0757  BP: 114/61  112/60 (!) 142/63  Pulse: 88  90   Resp: 19  18 18   Temp: 97.9 F (36.6 C)  99.5 F (37.5 C) 97.9 F (36.6 C)  TempSrc: Oral  Oral Oral  SpO2: 100% 100% 100%   Weight:   106.1 kg   Height:        Intake/Output Summary (Last 24 hours) at 07/18/2019 0805 Last data filed at 07/18/2019 0700 Gross per 24 hour  Intake --  Output 2500 ml  Net -2500 ml   Filed Weights   07/15/19 0441 07/17/19 0335 07/18/19 0431  Weight: 107.2 kg 107.7 kg 106.1 kg    Examination:   General exam: Deconditioned, debilitated elderly male hard on hearing Respiratory system: Bilateral scattered wheezes Cardiovascular system: S1 & S2 heard, RRR. No JVD, murmurs, rubs, gallops or clicks. Gastrointestinal system: Abdomen is nondistended, soft and nontender. No organomegaly or masses felt. Normal bowel sounds heard. Central nervous system: Alert and oriented.. Extremities: No edema, no clubbing ,no cyanosis Skin: No rashes, lesions or ulcers,no icterus ,no pallor Skin: scattered ecchymosis, senile purpura, no ulcers   Data Reviewed: I have personally reviewed following labs and imaging studies  CBC: Recent Labs  Lab 07/13/19 2122 07/14/19 1324 07/15/19 0407 07/17/19 0759 07/18/19 0257  WBC 11.0* 9.3 11.5* 11.5* 10.1  NEUTROABS  --   --   --  10.4* 8.3*  HGB 12.8* 11.9* 11.5* 11.7* 11.4*  HCT 39.8 38.3* 36.8* 39.2 36.5*  MCV 89.0 90.5 91.8 94.0 92.6  PLT 244 225 248 258 564   Basic Metabolic Panel: Recent Labs  Lab 07/13/19 2122 07/14/19 1324 07/15/19 0407 07/15/19 0407 07/15/19 1642 07/16/19 0451 07/17/19 0759 07/17/19 1753 07/18/19 0257  NA  128*  --  128*  --  129* 128* 132*  --  133*  K 4.3  --  4.8   < > 5.3* 5.2* 5.6* 5.2* 4.3  CL 93*  --  93*  --  94* 93* 93*  --  89*  CO2 28  --  27  --  28 29 32  --  36*  GLUCOSE 129*  --  240*  --  167* 164* 166*  --  163*  BUN 16  --  22  --  23 23 24*  --  23  CREATININE 0.74   < > 1.02  --  0.98 0.86 0.94  --  0.95  CALCIUM 8.4*  --  8.9  --  8.7* 8.5* 8.8*  --  8.7*  MG 2.0  --   --   --   --   --   --   --   --    < > = values in this interval not displayed.   GFR: Estimated Creatinine Clearance: 65.9 mL/min (by C-G formula based on SCr of 0.95 mg/dL). Liver Function Tests: Recent Labs  Lab 07/13/19 2122 07/15/19 0407  AST 41 32  ALT 21 25  ALKPHOS 90 76  BILITOT 1.1 1.0  PROT 6.5 6.0*  ALBUMIN 3.4* 3.1*   No results for input(s): LIPASE, AMYLASE in the last 168 hours. No results for input(s): AMMONIA in the last 168 hours. Coagulation Profile: Recent Labs  Lab 07/13/19 2122  INR 1.0   Cardiac Enzymes: No results for input(s): CKTOTAL, CKMB, CKMBINDEX, TROPONINI in the last 168 hours. BNP (last 3 results) No results for input(s): PROBNP in the last 8760 hours. HbA1C: No results for input(s): HGBA1C in the last 72 hours. CBG: Recent Labs  Lab 07/17/19 1210 07/17/19 1556 07/17/19 1759 07/17/19 2053 07/18/19 0755  GLUCAP 266* 237* 192* 188* 201*   Lipid Profile: No results for input(s): CHOL, HDL, LDLCALC, TRIG, CHOLHDL, LDLDIRECT in the last 72 hours. Thyroid Function Tests: No results for input(s): TSH, T4TOTAL, FREET4, T3FREE, THYROIDAB in the last 72 hours. Anemia Panel: No results for input(s): VITAMINB12, FOLATE, FERRITIN, TIBC, IRON, RETICCTPCT in the last 72 hours. Sepsis Labs: Recent Labs  Lab 07/13/19 2122  LATICACIDVEN 1.1    Recent Results (from the past 240 hour(s))  Culture, blood (single) w Reflex to ID Panel     Status: None (Preliminary result)   Collection Time: 07/13/19  9:00 PM   Specimen: BLOOD LEFT FOREARM  Result Value Ref  Range Status   Specimen Description   Final    BLOOD LEFT FOREARM Performed at [facility], [address] [place], [place] [address]    Special Requests   Final    BOTTLES DRAWN AEROBIC AND ANAEROBIC Blood Culture adequate volume Performed at [facility], [address] [place], [place] [address]    Culture   Final    NO GROWTH 3 DAYS Performed at [facility], [place] [address], [place], [place] [address]    Report Status PENDING  Incomplete  SARS Coronavirus 2 by RT PCR (hospital order, performed in [facility] hospital lab) Nasopharyngeal Nasopharyngeal Swab     Status: None   Collection Time: 07/13/19  9:47 PM   Specimen: Nasopharyngeal Swab  Result Value Ref Range Status   SARS Coronavirus 2 NEGATIVE NEGATIVE Final    Comment: (NOTE) SARS-CoV-2 target nucleic acids are NOT DETECTED.  The SARS-CoV-2 RNA is generally detectable in upper and lower respiratory specimens during the acute phase of infection. The lowest concentration of SARS-CoV-2 viral copies this assay can detect is 250 copies / mL. A negative result does not preclude SARS-CoV-2 infection and should not be used as the sole basis for treatment or other patient management decisions.  A negative result may occur with improper specimen collection / handling, submission of specimen other than nasopharyngeal swab, presence of viral mutation(s) within the areas targeted by this assay, and inadequate number of viral copies (<250 copies / mL). A negative result must be combined with clinical observations, patient history, and epidemiological information.  Fact Sheet for Patients:   StrictlyIdeas.no  Fact Sheet for Healthcare Providers: BankingDealers.co.za  This test is not yet approved or  cleared by the [place] FDA and has been authorized for detection and/or diagnosis of SARS-CoV-2 by FDA under an Emergency Use Authorization (EUA).   This EUA will remain in effect (meaning this test can be used) for the duration of the COVID-19 declaration under Section 564(b)(1) of the Act, 21 U.S.C. section 360bbb-3(b)(1), unless the authorization is terminated or revoked sooner.  Performed  at [facility], [address] [place], [place] [address]          Radiology Studies: DG Chest 1 View  Result Date: 07/17/2019 CLINICAL DATA:  Shortness of breath.  Wheezing. EXAM: CHEST  1 VIEW COMPARISON:  Radiograph 07/15/2019 FINDINGS: Patient is rotated. Stable heart size and mediastinal contours allowing for rotation. Aortic atherosclerosis. Suspected small pleural effusions, increased from prior imaging. Interstitial prominence is increased, favor pulmonary edema over bronchitic. No pneumothorax. IMPRESSION: 1. Increasing small pleural effusions over the past few days. 2. Increasing interstitial prominence, favor pulmonary edema over bronchitic markings. Electronically Signed   By: Keith Rake M.D.   On: 07/17/2019 16:49        Scheduled Meds: . allopurinol  100 mg Oral Daily  . aspirin EC  81 mg Oral Daily  . azithromycin  250 mg Oral Daily  . budesonide (PULMICORT) nebulizer solution  0.25 mg Nebulization BID  . dextromethorphan-guaiFENesin  2 tablet Oral BID  . DULoxetine  30 mg Oral Daily  . enoxaparin (LOVENOX) injection  40 mg Subcutaneous Q24H  . ferrous sulfate  325 mg Oral Daily  . insulin aspart  0-15 Units Subcutaneous TID WC  . insulin aspart  0-5 Units Subcutaneous QHS  . ipratropium-albuterol  3 mL Nebulization QID  . levothyroxine  75 mcg Oral Q0600  . methylPREDNISolone (SOLU-MEDROL) injection  60 mg Intravenous Q12H  . pantoprazole  40 mg Oral Daily  . rosuvastatin  40 mg Oral Daily   Continuous Infusions:   LOS: 3 days    Time spent: 25 mins.More than 50% of that time was spent in counseling and/or coordination of care.      Shelly Coss, MD [facility] P7/16/2021,  8:05 AM

## 2019-07-18 NOTE — Progress Notes (Signed)
Physical Therapy Treatment Patient Details Name: Ernest Clark. MRN: [id number] DOB: 1931/03/25 Today's Date: 07/18/2019    History of Present Illness 86 y.o. male with medical history significant of COPD-not on home oxygen, hypertension, hyperlipidemia, coronary artery disease, diabetes, peripheral arterial disease, chronic hyponatremia, former tobacco/alcohol abuse presents to emergency department due to generalized weakness and slid out of the bed 07/14/19 and incurred an approximately 3 inch skin tear to right forearm-which was repaired at [facility] Pt transferred to [facility] ED for evaluation of bradycardia. Admitted 07/14/19 for COPD exacerbation     PT Comments    Pt continues to be confused asking PT tech "Am I dead?" Once pt assured he was not dead, agreeable to getting up to chair with therapy. Pt is limited in safe mobility by decreased cognition and HoH, in presence of decreased strength and balance. Pt is modAx2 for coming to EoB and maxAx2 for pivoting to recliner. D/c plans remain appropriate at this time. PT will continue to follow acutely.    Follow Up Recommendations  SNF     Equipment Recommendations  Other (comment) (TBD at next venue)       Precautions / Restrictions Precautions Precautions: Fall Precaution Comments: fall prior to admission  Restrictions Weight Bearing Restrictions: No    Mobility  Bed Mobility Overal bed mobility: Needs Assistance Bed Mobility: Supine to Sit     Supine to sit: +2 for physical assistance;Mod assist     General bed mobility comments: Pt moving BLEs to EOB; Pt requiring modAx2 for trunk to upright and pad scoot of hips to EoB  Transfers Overall transfer level: Needs assistance Equipment used: 2 person hand held assist Transfers: Sit to/from Stand Sit to Stand: From elevated surface;+2 physical assistance;Max assist Stand pivot transfers: +2 physical assistance;From elevated surface;Max assist        General transfer comment: max Ax2 for sit>stand and for pivoting transfer to recliner on his R side.   Ambulation/Gait             General Gait Details: unable           Balance Overall balance assessment: Needs assistance Sitting-balance support: Feet supported;Bilateral upper extremity supported Sitting balance-Leahy Scale: Poor     Standing balance support: Bilateral upper extremity supported Standing balance-Leahy Scale: Zero                              Cognition Arousal/Alertness: Awake/alert Behavior During Therapy: WFL for tasks assessed/performed Overall Cognitive Status: Difficult to assess Area of Impairment: Orientation;Safety/judgement;Awareness;Following commands;Problem solving                 Orientation Level: Disoriented to;Situation (thinks he is dead)     Following Commands: Follows multi-step commands with increased time;Follows one step commands with increased time Safety/Judgement: Decreased awareness of safety Awareness: Emergent Problem Solving: Slow processing;Requires verbal cues;Requires tactile cues General Comments: requires increased cuing due to difficulty hearing, but requires extra time for initation          General Comments General comments (skin integrity, edema, etc.): Pt on 4L O2 via [place] on entry however out of one nostril on entry with SaO2 96%O2, Marvin replaced and SaO2 remained >92%O2 with transfer, pt request kerlix wrap of R forearm be replaced as it is sliding down his arm, RN notified      Pertinent Vitals/Pain Pain Assessment: Faces Faces Pain Scale: Hurts a little bit  Pain Location: R forearm in location of stiches Pain Descriptors / Indicators: Grimacing;Guarding Pain Intervention(s): Limited activity within patient's tolerance;Monitored during session;Repositioned           PT Goals (current goals can now be found in the care plan section) Acute Rehab PT Goals Patient Stated Goal: pt is  anxious to see his son PT Goal Formulation: With patient/family Time For Goal Achievement: 07/30/19 Potential to Achieve Goals: Good Progress towards PT goals: Progressing toward goals    Frequency    Min 2X/week      PT Plan Current plan remains appropriate       AM-PAC PT "6 Clicks" Mobility   Outcome Measure  Help needed turning from your back to your side while in a flat bed without using bedrails?: A Lot Help needed moving from lying on your back to sitting on the side of a flat bed without using bedrails?: Total Help needed moving to and from a bed to a chair (including a wheelchair)?: Total Help needed standing up from a chair using your arms (e.g., wheelchair or bedside chair)?: Total Help needed to walk in hospital room?: Total Help needed climbing 3-5 steps with a railing? : Total 6 Click Score: 7    End of Session Equipment Utilized During Treatment: Gait belt;Oxygen Activity Tolerance: Patient limited by fatigue Patient left: in chair;with call bell/phone within reach;with chair alarm set;with family/visitor present Nurse Communication: Mobility status PT Visit Diagnosis: Unsteadiness on feet (R26.81);Other abnormalities of gait and mobility (R26.89);Muscle weakness (generalized) (M62.81);Difficulty in walking, not elsewhere classified (R26.2);History of falling (Z91.81)     Time: 1448-1856 PT Time Calculation (min) (ACUTE ONLY): 17 min  Charges:  $Therapeutic Activity: 8-22 mins                     Elizabeth B. Migdalia Dk PT, DPT Acute Rehabilitation Services Pager [phone] Office [phone]    [place] 07/18/2019, 1:27 PM

## 2019-07-18 NOTE — TOC Progression Note (Signed)
Transition of Care [facility]) - Progression Note    Patient Details  Name: Ernest Clark. MRN: [id number] Date of Birth: 1931-03-02  Transition of Care [facility]) CM/SW [place], [place] Phone Number: 07/18/2019, 12:45 PM  Clinical Narrative:     CSW left voicemail for patients son Ernest Clark to call CSW back. CSW will provide SNF bed offers to patients son when he returns call.  CSW will provide SNF bed offers when patients son calls back.  CSW will continue to follow.  Expected Discharge Plan: [place] Barriers to Discharge: Continued Medical Work up  Expected Discharge Plan and Services Expected Discharge Plan: [place] arrangements for the past 2 months: Single Family Home                                       Social Determinants of Health (SDOH) Interventions    Readmission Risk Interventions No flowsheet data found.

## 2019-07-19 DIAGNOSIS — J9601 Acute respiratory failure with hypoxia: Secondary | ICD-10-CM | POA: Diagnosis not present

## 2019-07-19 LAB — CULTURE, BLOOD (SINGLE)
Culture: NO GROWTH
Special Requests: ADEQUATE

## 2019-07-19 LAB — GLUCOSE, CAPILLARY
Glucose-Capillary: 187 mg/dL — ABNORMAL HIGH (ref 70–99)
Glucose-Capillary: 199 mg/dL — ABNORMAL HIGH (ref 70–99)
Glucose-Capillary: 175 mg/dL — ABNORMAL HIGH (ref 70–99)
Glucose-Capillary: 187 mg/dL — ABNORMAL HIGH (ref 70–99)

## 2019-07-19 MED ORDER — IPRATROPIUM-ALBUTEROL 0.5-2.5 (3) MG/3ML IN SOLN
3.0000 mL | Freq: Three times a day (TID) | RESPIRATORY_TRACT | Status: DC
  Administered 2019-07-19 (×2): 3 mL via RESPIRATORY_TRACT
  Filled 2019-07-19 (×2): qty 3

## 2019-07-19 MED ORDER — PREDNISONE 20 MG PO TABS: 40.0000 mg | ORAL_TABLET | Freq: Every day | ORAL | Status: DC

## 2019-07-19 NOTE — TOC Progression Note (Addendum)
Transition of Care [facility]) - Progression Note    Patient Details  Name: Ernest Clark. MRN: [id number] Date of Birth: 06-22-1931  Transition of Care [facility]) CM/SW [place], [place] Phone Number: [phone] 07/19/2019, 11:33 AM  Clinical Narrative:    CSW attempted to reach out to patient's son Neithan in order to confirm bed choice. CSW had to leave a message.  12:05pm- CSW received a call back from [place] and he confirmed that the plan is for patient to be discharged to [facility] for SNF.  TOC team will continue to assist with discharge planning needs.   Expected Discharge Plan: [place] Barriers to Discharge: Continued Medical Work up  Expected Discharge Plan and Services Expected Discharge Plan: [place] arrangements for the past 2 months: Single Family Home                                       Social Determinants of Health (SDOH) Interventions    Readmission Risk Interventions No flowsheet data found.

## 2019-07-19 NOTE — Progress Notes (Signed)
PROGRESS NOTE    Ernest Clark.  FHL:[id number] DOB: 06-01-1931 DOA: 07/13/2019 PCP: Hulan Fess, MD   Brief Narrative:  Patient is a 86 year old male with history of COPD not on home oxygen, hypertension, hyperlipidemia, coronary artery disease, diabetes type 2, peripheral artery disease, chronic hyponatremia, former tobacco/alcohol abuse presents to the emergency department with complaints of generalized weakness and also had a fall from his bed sustaining a skin tear on his right forearm.  Patient lives alone.  He has been increasingly weak and had trouble with ALDs.  He was admitted for the management of COPD exacerbation/hypoxic respiratory failure.  Palliative care also consulted  due to his persistent respiratory failure for possible goals of care.  Respiratory status is improving.  He is medically stable for discharge to skilled facility soon as bed is available.  Assessment & Plan:   Principal Problem:   Acute hypoxemic respiratory failure (HCC) Active Problems:   Coronary heart disease   Essential hypertension   Hyperlipidemia   Hyponatremia   Type 2 diabetes mellitus without complication, with long-term current use of insulin (HCC)   COPD with acute exacerbation (HCC)   Elevated troponin   COPD exacerbation (HCC)   Palliative care encounter   Acute hypoxic respiratory failure/COPD exacerbation: Presented with rhonchi, wheezing, respiratory distress.  Started on IV steroids, Z-Pak, bronchodilators.  Currently on 1 L oxygen via nasal cannula.  He was on BiPAP till 07/16/2019. Chest x-ray on admission showed chronic emphysematous changes without pneumonia.Last follow-up chest x-ray showed increased interstitial densities bilaterally which may reflect mild edema or atypical infection superimposed on chronic lung disease. Palliative care also following regarding his poor prognosis, advanced age. Chest x-ray follow-up showed interstitial edema/small pleural effusions, given a  dose of Lasix IV on 07/17/19. Respiratory status continues to improve.  He does not have any significant wheezes today.  Still coughing little bit.  We will change steroids to oral.  Heart block: EKG showed Wenckebach phenomena.  Cardiology was consulted.Cardiology recommended to avoid AV nodal blockers, no indication for pacemaker.  Hyponatremia: Currently sodium is stable at 130s.  He has  history of chronic hyponatremia.    Hyperkalemia: Resolved with lokelma  Coronary artery disease/peripheral artery disease: Continue aspirin, Crestor  Hypertension: Blood pressure stable.  Losartan on hold.  Type 2 diabetes mellitus: Continue sliding scale insulin.  Takes Tyler Aas at home.  Goals of care: Elderly with advanced COPD.  Palliative care consulted for goals of care.  DNR.  Debility/deconditioning:He will need skilled nursing facility on discharge.PT/OT evaluation done  Twitching of extremities: Unknown etiology. CT head was negative for any acute intracranial abnormalities.  Patient is alert and oriented.  Right arm laceration: Managed  in the emergency department.  Continue pain management  Peripheral artery disease: Continue aspirin, statin          DVT prophylaxis:Lovenox Code Status: DNR Family Communication:Son at  Bedside on 07/19/19 Status is: Inpatient  Remains inpatient appropriate because:Hemodynamically unstable   Dispo: The patient is from: Home              Anticipated d/c is to: SNF              Anticipated d/c date is: As soon as bed is available              Patient currently is medically stable for DC  Consultants: None  Procedures:None  Antimicrobials:  Anti-infectives (From admission, onward)   Start     Dose/Rate Route Frequency Ordered  Stop   07/15/19 1000  azithromycin (ZITHROMAX) tablet 250 mg  Status:  Discontinued        250 mg Oral Daily 07/15/19 0948 07/18/19 1357   07/14/19 1315  azithromycin (ZITHROMAX) 500 mg in sodium chloride 0.9 % 250  mL IVPB  Status:  Discontinued        500 mg 250 mL/hr over 60 Minutes Intravenous Every 24 hours 07/14/19 1303 07/15/19 0948      Subjective:  Patient seen and examined at the bedside this morning.  Hemodynamically stable.  Comfortable.  Denies any shortness of breath.  Still has some cough but overall improving.  Son was at the bedside.   Objective: Vitals:   07/19/19 0508 07/19/19 0754 07/19/19 0757 07/19/19 0804  BP: (!) 156/76   (!) 141/79  Pulse: 99   93  Resp: 18   20  Temp: 97.7 F (36.5 C)   98.3 F (36.8 C)  TempSrc: Oral   Oral  SpO2: 100% 98% 96% 96%  Weight: 103.9 kg     Height:        Intake/Output Summary (Last 24 hours) at 07/19/2019 0844 Last data filed at 07/19/2019 8101 Gross per 24 hour  Intake 480 ml  Output 800 ml  Net -320 ml   Filed Weights   07/17/19 0335 07/18/19 0431 07/19/19 0508  Weight: 107.7 kg 106.1 kg 103.9 kg    Examination:   General exam: Pleasant elderly gentleman Respiratory system: No obvious crackles or wheezes  cardiovascular system: S1 & S2 heard, RRR. No JVD, murmurs, rubs, gallops or clicks. Gastrointestinal system: Abdomen is nondistended, soft and nontender. No organomegaly or masses felt. Normal bowel sounds heard. Central nervous system: Alert and oriented.  Extremities: No edema, no clubbing ,no cyanosis. Skin: No rashes, lesions or ulcers,no icterus ,no pallor    Data Reviewed: I have personally reviewed following labs and imaging studies  CBC: Recent Labs  Lab 07/13/19 2122 07/14/19 1324 07/15/19 0407 07/17/19 0759 07/18/19 0257  WBC 11.0* 9.3 11.5* 11.5* 10.1  NEUTROABS  --   --   --  10.4* 8.3*  HGB 12.8* 11.9* 11.5* 11.7* 11.4*  HCT 39.8 38.3* 36.8* 39.2 36.5*  MCV 89.0 90.5 91.8 94.0 92.6  PLT 244 225 248 258 751   Basic Metabolic Panel: Recent Labs  Lab 07/13/19 2122 07/14/19 1324 07/15/19 0407 07/15/19 0407 07/15/19 1642 07/16/19 0451 07/17/19 0759 07/17/19 1753 07/18/19 0257  NA  128*  --  128*  --  129* 128* 132*  --  133*  K 4.3  --  4.8   < > 5.3* 5.2* 5.6* 5.2* 4.3  CL 93*  --  93*  --  94* 93* 93*  --  89*  CO2 28  --  27  --  28 29 32  --  36*  GLUCOSE 129*  --  240*  --  167* 164* 166*  --  163*  BUN 16  --  22  --  23 23 24*  --  23  CREATININE 0.74   < > 1.02  --  0.98 0.86 0.94  --  0.95  CALCIUM 8.4*  --  8.9  --  8.7* 8.5* 8.8*  --  8.7*  MG 2.0  --   --   --   --   --   --   --   --    < > = values in this interval not displayed.   GFR: Estimated Creatinine Clearance: 65.2 mL/min (  by C-G formula based on SCr of 0.95 mg/dL). Liver Function Tests: Recent Labs  Lab 07/13/19 2122 07/15/19 0407  AST 41 32  ALT 21 25  ALKPHOS 90 76  BILITOT 1.1 1.0  PROT 6.5 6.0*  ALBUMIN 3.4* 3.1*   No results for input(s): LIPASE, AMYLASE in the last 168 hours. No results for input(s): AMMONIA in the last 168 hours. Coagulation Profile: Recent Labs  Lab 07/13/19 2122  INR 1.0   Cardiac Enzymes: No results for input(s): CKTOTAL, CKMB, CKMBINDEX, TROPONINI in the last 168 hours. BNP (last 3 results) No results for input(s): PROBNP in the last 8760 hours. HbA1C: No results for input(s): HGBA1C in the last 72 hours. CBG: Recent Labs  Lab 07/18/19 0755 07/18/19 1146 07/18/19 1612 07/18/19 2142 07/19/19 0803  GLUCAP 201* 182* 183* 206* 187*   Lipid Profile: No results for input(s): CHOL, HDL, LDLCALC, TRIG, CHOLHDL, LDLDIRECT in the last 72 hours. Thyroid Function Tests: No results for input(s): TSH, T4TOTAL, FREET4, T3FREE, THYROIDAB in the last 72 hours. Anemia Panel: No results for input(s): VITAMINB12, FOLATE, FERRITIN, TIBC, IRON, RETICCTPCT in the last 72 hours. Sepsis Labs: Recent Labs  Lab 07/13/19 2122  LATICACIDVEN 1.1    Recent Results (from the past 240 hour(s))  Culture, blood (single) w Reflex to ID Panel     Status: None (Preliminary result)   Collection Time: 07/13/19  9:00 PM   Specimen: BLOOD LEFT FOREARM  Result Value Ref  Range Status   Specimen Description   Final    BLOOD LEFT FOREARM Performed at [facility], [address] [place], [place] [address]    Special Requests   Final    BOTTLES DRAWN AEROBIC AND ANAEROBIC Blood Culture adequate volume Performed at [facility], [address] [place], [place] [address]    Culture   Final    NO GROWTH 4 DAYS Performed at [facility], [place] [address], [place], [place] [address]    Report Status PENDING  Incomplete  SARS Coronavirus 2 by RT PCR (hospital order, performed in [facility] hospital lab) Nasopharyngeal Nasopharyngeal Swab     Status: None   Collection Time: 07/13/19  9:47 PM   Specimen: Nasopharyngeal Swab  Result Value Ref Range Status   SARS Coronavirus 2 NEGATIVE NEGATIVE Final    Comment: (NOTE) SARS-CoV-2 target nucleic acids are NOT DETECTED.  The SARS-CoV-2 RNA is generally detectable in upper and lower respiratory specimens during the acute phase of infection. The lowest concentration of SARS-CoV-2 viral copies this assay can detect is 250 copies / mL. A negative result does not preclude SARS-CoV-2 infection and should not be used as the sole basis for treatment or other patient management decisions.  A negative result may occur with improper specimen collection / handling, submission of specimen other than nasopharyngeal swab, presence of viral mutation(s) within the areas targeted by this assay, and inadequate number of viral copies (<250 copies / mL). A negative result must be combined with clinical observations, patient history, and epidemiological information.  Fact Sheet for Patients:   StrictlyIdeas.no  Fact Sheet for Healthcare Providers: BankingDealers.co.za  This test is not yet approved or  cleared by the [place] FDA and has been authorized for detection and/or diagnosis of SARS-CoV-2 by FDA under an Emergency Use Authorization (EUA).   This EUA will remain in effect (meaning this test can be used) for the duration of the COVID-19 declaration under Section 564(b)(1) of the  Act, 21 U.S.C. section 360bbb-3(b)(1), unless the authorization is terminated or revoked sooner.  Performed at [facility], [address], [place], [place] [address]          Radiology Studies: DG Chest 1 View  Result Date: 07/17/2019 CLINICAL DATA:  Shortness of breath.  Wheezing. EXAM: CHEST  1 VIEW COMPARISON:  Radiograph 07/15/2019 FINDINGS: Patient is rotated. Stable heart size and mediastinal contours allowing for rotation. Aortic atherosclerosis. Suspected small pleural effusions, increased from prior imaging. Interstitial prominence is increased, favor pulmonary edema over bronchitic. No pneumothorax. IMPRESSION: 1. Increasing small pleural effusions over the past few days. 2. Increasing interstitial prominence, favor pulmonary edema over bronchitic markings. Electronically Signed   By: Keith Rake M.D.   On: 07/17/2019 16:49        Scheduled Meds: . allopurinol  100 mg Oral Daily  . aspirin EC  81 mg Oral Daily  . budesonide (PULMICORT) nebulizer solution  0.25 mg Nebulization BID  . dextromethorphan-guaiFENesin  2 tablet Oral BID  . DULoxetine  30 mg Oral Daily  . enoxaparin (LOVENOX) injection  40 mg Subcutaneous Q24H  . ferrous sulfate  325 mg Oral Daily  . insulin aspart  0-15 Units Subcutaneous TID WC  . insulin aspart  0-5 Units Subcutaneous QHS  . ipratropium-albuterol  3 mL Nebulization QID  . levothyroxine  75 mcg Oral Q0600  . methylPREDNISolone (SOLU-MEDROL) injection  60 mg Intravenous Q12H  . pantoprazole  40 mg Oral Daily  . rosuvastatin  40 mg Oral Daily   Continuous Infusions:   LOS: 4 days    Time spent: 25 mins.More than 50% of that time was spent in counseling and/or coordination of care.      Shelly Coss, MD [facility] P7/17/2021, 8:44 AM

## 2019-07-20 ENCOUNTER — Inpatient Hospital Stay (HOSPITAL_COMMUNITY): Payer: Medicare Other

## 2019-07-20 DIAGNOSIS — J9601 Acute respiratory failure with hypoxia: Secondary | ICD-10-CM

## 2019-07-20 LAB — GLUCOSE, CAPILLARY
Glucose-Capillary: 189 mg/dL — ABNORMAL HIGH (ref 70–99)
Glucose-Capillary: 146 mg/dL — ABNORMAL HIGH (ref 70–99)
Glucose-Capillary: 248 mg/dL — ABNORMAL HIGH (ref 70–99)

## 2019-07-20 LAB — ECHOCARDIOGRAM COMPLETE
Height: 68 in
S' Lateral: 1.8 cm
Weight: 3647.29 oz

## 2019-07-20 MED ORDER — IPRATROPIUM-ALBUTEROL 0.5-2.5 (3) MG/3ML IN SOLN
RESPIRATORY_TRACT | Status: AC
  Administered 2019-07-20: 3 mL via RESPIRATORY_TRACT
  Filled 2019-07-20: qty 3

## 2019-07-20 MED ORDER — IPRATROPIUM-ALBUTEROL 0.5-2.5 (3) MG/3ML IN SOLN
3.0000 mL | Freq: Four times a day (QID) | RESPIRATORY_TRACT | Status: DC
  Administered 2019-07-20 – 2019-07-22 (×8): 3 mL via RESPIRATORY_TRACT
  Filled 2019-07-20 (×8): qty 3

## 2019-07-20 MED ORDER — IPRATROPIUM-ALBUTEROL 0.5-2.5 (3) MG/3ML IN SOLN
3.0000 mL | Freq: Four times a day (QID) | RESPIRATORY_TRACT | Status: DC
  Administered 2019-07-20: 3 mL via RESPIRATORY_TRACT
  Filled 2019-07-20: qty 3

## 2019-07-20 MED ORDER — ALBUTEROL SULFATE (2.5 MG/3ML) 0.083% IN NEBU: 2.5000 mg | INHALATION_SOLUTION | RESPIRATORY_TRACT | Status: DC | PRN

## 2019-07-20 MED ORDER — METHYLPREDNISOLONE SODIUM SUCC 40 MG IJ SOLR
40.0000 mg | Freq: Two times a day (BID) | INTRAMUSCULAR | Status: DC
  Administered 2019-07-20 – 2019-07-21 (×3): 40 mg via INTRAVENOUS
  Filled 2019-07-20 (×3): qty 1

## 2019-07-20 NOTE — Progress Notes (Signed)
  Echocardiogram 2D Echocardiogram has been performed.  Ernest Clark 07/20/2019, 5:22 PM

## 2019-07-20 NOTE — Consult Note (Signed)
WOC Nurse Consult Note: Reason for Consult: Skin teras to right forearm and right hand.  Forearm skin injury is > than hand Wound type:trauma Pressure Injury POA: N/A Measurement: to be obtained by bedside RN Leigh when performing wound care Wound bed: red, mosit Drainage (amount, consistency, odor) serosanguinous, small amount Periwound: ecchymotic Dressing procedure/placement/frequency: I will provide guidance via the Orders for daily wound care using an antimicrobial nonadherent (Xeroform) topped with dray gauze and secured with Kerlix roll gauze/paper tape (no tape on skin). Patient is to transfer to [facility] as soon as bed is available. A silicone foam dressing is also to be placed to pr4event pressure injury to the sacral area.  [place] nursing team will not follow, but will remain available to this patient, the nursing and medical teams.  Please re-consult if needed. Thanks, Maudie Flakes, MSN, RN, [place], Arther Abbott  Pager# [phone]

## 2019-07-20 NOTE — Progress Notes (Signed)
PROGRESS NOTE    Pauline Aus.  OHY:[id number] DOB: 06-Apr-1931 DOA: 07/13/2019 PCP: Hulan Fess, MD   Brief Narrative:  Patient is a 86 year old male with history of COPD not on home oxygen, hypertension, hyperlipidemia, coronary artery disease, diabetes type 2, peripheral artery disease, chronic hyponatremia, former tobacco/alcohol abuse presents to the emergency department with complaints of generalized weakness and also had a fall from his bed sustaining a skin tear on his right forearm.  Patient lives alone.  He has been increasingly weak and had trouble with ALDs.  He was admitted for the management of COPD exacerbation/hypoxic respiratory failure.  Palliative care also consulted  due to his persistent respiratory failure for possible goals of care.  Respiratory status is improving but very slow.  Persistently wheezing.  I have also requested Dr. Lamonte Sakai for consultation.  Assessment & Plan:   Principal Problem:   Acute hypoxemic respiratory failure (HCC) Active Problems:   Coronary heart disease   Essential hypertension   Hyperlipidemia   Hyponatremia   Type 2 diabetes mellitus without complication, with long-term current use of insulin (HCC)   COPD with acute exacerbation (HCC)   Elevated troponin   COPD exacerbation (HCC)   Palliative care encounter   Acute hypoxic respiratory failure/COPD exacerbation: Presented with rhonchi, wheezing, respiratory distress.  Started on IV steroids, Z-Pak, bronchodilators.  Currently on 1 L oxygen via nasal cannula.  He was on BiPAP till 07/16/2019. Chest x-ray on admission showed chronic emphysematous changes without pneumonia.Last follow-up chest x-ray showed increased interstitial densities bilaterally which may reflect mild edema or atypical infection superimposed on chronic lung disease. Palliative care also following regarding his poor prognosis, advanced age. Chest x-ray follow-up showed interstitial edema/small pleural effusions,  given a dose of Lasix IV on 07/17/19. Respiratory status continues to improve but very slow.  He still has significant wheezes today.  Still coughing .Steroids switched back to IV. We will get a CXR today,will also check his echocardiogram.  Heart block: EKG showed Wenckebach phenomena.  Cardiology was consulted.Cardiology recommended to avoid AV nodal blockers, no indication for pacemaker.  Hyponatremia: Currently sodium is stable at 130s.  He has  history of chronic hyponatremia.    Hyperkalemia: Resolved with lokelma  Coronary artery disease/peripheral artery disease: Continue aspirin, Crestor  Hypertension: Blood pressure stable.  Losartan on hold.  Type 2 diabetes mellitus: Continue sliding scale insulin.  Takes Tyler Aas at home.  Goals of care: Elderly with advanced COPD.  Palliative care consulted for goals of care.  DNR.  Debility/deconditioning:He will need skilled nursing facility on discharge.PT/OT evaluation done  Twitching of extremities: Unknown etiology. CT head was negative for any acute intracranial abnormalities.  Patient is alert and oriented.  Right arm laceration: Managed  in the emergency department.  Continue pain management/wound care  Peripheral artery disease: Continue aspirin, statin          DVT prophylaxis:Lovenox Code Status: DNR Family Communication:Son at  Bedside on 07/19/19 Status is: Inpatient  Remains inpatient appropriate because:Hemodynamically unstable   Dispo: The patient is from: Home              Anticipated d/c is to: SNF              Anticipated d/c date is: 1-2 days              Patient currently is not medically stable for DC  Consultants: None  Procedures:None  Antimicrobials:  Anti-infectives (From admission, onward)   Start  Dose/Rate Route Frequency Ordered Stop   07/15/19 1000  azithromycin (ZITHROMAX) tablet 250 mg  Status:  Discontinued        250 mg Oral Daily 07/15/19 0948 07/18/19 1357   07/14/19 1315   azithromycin (ZITHROMAX) 500 mg in sodium chloride 0.9 % 250 mL IVPB  Status:  Discontinued        500 mg 250 mL/hr over 60 Minutes Intravenous Every 24 hours 07/14/19 1303 07/15/19 0948      Subjective:  Patient seen and examined at bedside this morning.  Hemodynamically stable.  Still coughing.  Still having wheezes.On 2 L of oxygen    Objective: Vitals:   07/20/19 0155 07/20/19 0425 07/20/19 0726 07/20/19 0748  BP:  138/68  (!) 146/71  Pulse: 90 87  86  Resp: 20 17  20   Temp:  97.9 F (36.6 C)  97.9 F (36.6 C)  TempSrc:  Oral  Oral  SpO2: 96% 96% 97% 97%  Weight:  103.4 kg    Height:        Intake/Output Summary (Last 24 hours) at 07/20/2019 0839 Last data filed at 07/20/2019 0430 Gross per 24 hour  Intake 60 ml  Output 1450 ml  Net -1390 ml   Filed Weights   07/18/19 0431 07/19/19 0508 07/20/19 0425  Weight: 106.1 kg 103.9 kg 103.4 kg    Examination:  General exam: Extremely deconditioned, debilitated elderly male Respiratory system: Bilateral decreased air entry, bilateral rhonchi, wheezes  cardiovascular system: S1 & S2 heard, RRR. No JVD, murmurs, rubs, gallops or clicks. Gastrointestinal system: Abdomen is nondistended, soft and nontender. No organomegaly or masses felt. Normal bowel sounds heard. Central nervous system: Alert and oriented. Extremities: No edema, no clubbing ,no cyanosis, laceration / bleeding from right forearm Skin: No rashes, lesions or ulcers,no icterus ,no pallor   Data Reviewed: I have personally reviewed following labs and imaging studies  CBC: Recent Labs  Lab 07/13/19 2122 07/14/19 1324 07/15/19 0407 07/17/19 0759 07/18/19 0257  WBC 11.0* 9.3 11.5* 11.5* 10.1  NEUTROABS  --   --   --  10.4* 8.3*  HGB 12.8* 11.9* 11.5* 11.7* 11.4*  HCT 39.8 38.3* 36.8* 39.2 36.5*  MCV 89.0 90.5 91.8 94.0 92.6  PLT 244 225 248 258 086   Basic Metabolic Panel: Recent Labs  Lab 07/13/19 2122 07/14/19 1324 07/15/19 0407 07/15/19 0407  07/15/19 1642 07/16/19 0451 07/17/19 0759 07/17/19 1753 07/18/19 0257  NA 128*  --  128*  --  129* 128* 132*  --  133*  K 4.3  --  4.8   < > 5.3* 5.2* 5.6* 5.2* 4.3  CL 93*  --  93*  --  94* 93* 93*  --  89*  CO2 28  --  27  --  28 29 32  --  36*  GLUCOSE 129*  --  240*  --  167* 164* 166*  --  163*  BUN 16  --  22  --  23 23 24*  --  23  CREATININE 0.74   < > 1.02  --  0.98 0.86 0.94  --  0.95  CALCIUM 8.4*  --  8.9  --  8.7* 8.5* 8.8*  --  8.7*  MG 2.0  --   --   --   --   --   --   --   --    < > = values in this interval not displayed.   GFR: Estimated Creatinine Clearance: 65.1 mL/min (by  C-G formula based on SCr of 0.95 mg/dL). Liver Function Tests: Recent Labs  Lab 07/13/19 2122 07/15/19 0407  AST 41 32  ALT 21 25  ALKPHOS 90 76  BILITOT 1.1 1.0  PROT 6.5 6.0*  ALBUMIN 3.4* 3.1*   No results for input(s): LIPASE, AMYLASE in the last 168 hours. No results for input(s): AMMONIA in the last 168 hours. Coagulation Profile: Recent Labs  Lab 07/13/19 2122  INR 1.0   Cardiac Enzymes: No results for input(s): CKTOTAL, CKMB, CKMBINDEX, TROPONINI in the last 168 hours. BNP (last 3 results) No results for input(s): PROBNP in the last 8760 hours. HbA1C: No results for input(s): HGBA1C in the last 72 hours. CBG: Recent Labs  Lab 07/18/19 2142 07/19/19 0803 07/19/19 1148 07/19/19 1613 07/19/19 2142  GLUCAP 206* 187* 199* 175* 187*   Lipid Profile: No results for input(s): CHOL, HDL, LDLCALC, TRIG, CHOLHDL, LDLDIRECT in the last 72 hours. Thyroid Function Tests: No results for input(s): TSH, T4TOTAL, FREET4, T3FREE, THYROIDAB in the last 72 hours. Anemia Panel: No results for input(s): VITAMINB12, FOLATE, FERRITIN, TIBC, IRON, RETICCTPCT in the last 72 hours. Sepsis Labs: Recent Labs  Lab 07/13/19 2122  LATICACIDVEN 1.1    Recent Results (from the past 240 hour(s))  Culture, blood (single) w Reflex to ID Panel     Status: None   Collection Time: 07/13/19   9:00 PM   Specimen: BLOOD LEFT FOREARM  Result Value Ref Range Status   Specimen Description   Final    BLOOD LEFT FOREARM Performed at [facility], [address] [place], [place] [address]    Special Requests   Final    BOTTLES DRAWN AEROBIC AND ANAEROBIC Blood Culture adequate volume Performed at [facility], [address] [place], [place] [address]    Culture   Final    NO GROWTH 5 DAYS Performed at [facility], [place] [address], [place], [place] [address]    Report Status 07/19/2019 FINAL  Final  SARS Coronavirus 2 by RT PCR (hospital order, performed in [facility] hospital lab) Nasopharyngeal Nasopharyngeal Swab     Status: None   Collection Time: 07/13/19  9:47 PM   Specimen: Nasopharyngeal Swab  Result Value Ref Range Status   SARS Coronavirus 2 NEGATIVE NEGATIVE Final    Comment: (NOTE) SARS-CoV-2 target nucleic acids are NOT DETECTED.  The SARS-CoV-2 RNA is generally detectable in upper and lower respiratory specimens during the acute phase of infection. The lowest concentration of SARS-CoV-2 viral copies this assay can detect is 250 copies / mL. A negative result does not preclude SARS-CoV-2 infection and should not be used as the sole basis for treatment or other patient management decisions.  A negative result may occur with improper specimen collection / handling, submission of specimen other than nasopharyngeal swab, presence of viral mutation(s) within the areas targeted by this assay, and inadequate number of viral copies (<250 copies / mL). A negative result must be combined with clinical observations, patient history, and epidemiological information.  Fact Sheet for Patients:   StrictlyIdeas.no  Fact Sheet for Healthcare Providers: BankingDealers.co.za  This test is not yet approved or  cleared by the [place] FDA and has been authorized for detection and/or diagnosis of  SARS-CoV-2 by FDA under an Emergency Use Authorization (EUA).  This EUA will remain in effect (meaning this test can be used) for the duration of the COVID-19 declaration under Section 564(b)(1) of the Act, 21  U.S.C. section 360bbb-3(b)(1), unless the authorization is terminated or revoked sooner.  Performed at [facility], [address], [place], [place] [address]          Radiology Studies: No results found.      Scheduled Meds: . allopurinol  100 mg Oral Daily  . aspirin EC  81 mg Oral Daily  . budesonide (PULMICORT) nebulizer solution  0.25 mg Nebulization BID  . dextromethorphan-guaiFENesin  2 tablet Oral BID  . DULoxetine  30 mg Oral Daily  . enoxaparin (LOVENOX) injection  40 mg Subcutaneous Q24H  . ferrous sulfate  325 mg Oral Daily  . insulin aspart  0-15 Units Subcutaneous TID WC  . insulin aspart  0-5 Units Subcutaneous QHS  . ipratropium-albuterol  3 mL Nebulization Q6H  . levothyroxine  75 mcg Oral Q0600  . pantoprazole  40 mg Oral Daily  . predniSONE  40 mg Oral Q breakfast  . rosuvastatin  40 mg Oral Daily   Continuous Infusions:   LOS: 5 days    Time spent: 25 mins.More than 50% of that time was spent in counseling and/or coordination of care.      Shelly Coss, MD [facility] P7/18/2021, 8:39 AM

## 2019-07-20 NOTE — Consult Note (Signed)
NAME:  Ernest Clark., MRN:  [id number], DOB:  27-Mar-1931, LOS: 5 ADMISSION DATE:  07/13/2019, CONSULTATION DATE: 07/20/2019 REFERRING MD:  Dr Tawanna Solo, CHIEF COMPLAINT:  Acute exacerbation COPD   Brief History     History of present illness   86 year old former smoker with CAD, hypertension, hyperlipidemia, type 2 diabetes, hypothyroidism, peripheral vascular disease.  I have followed him in office for severe COPD, last seen 07/2018.  He is not on home oxygen.  He was admitted on 7/12 for generalized weakness and after a fall from the bed that resulted in a right forearm skin tear.  He required initiation of 2 L/min nasal cannula O2.  Urinalysis had rare bacteria.  No infiltrates on chest x-ray.  COVID-19 testing negative.  Bilateral expiratory wheezes were documented on exam he was admitted for an acute exacerbation of COPD, treated with azithromycin, scheduled bronchodilators, Solu-Medrol.  He states that he still feels weak.  When asked why he had to come to the hospital he principally blames his right arm injury and skin tear.  He does note dyspnea and cough.  Admits to being very weak   Past Medical History   Past Medical History:  Diagnosis Date  . COPD (chronic obstructive pulmonary disease) ([place])   . Coronary heart disease    remote PCI  . Diabetes mellitus   . DVT (deep venous thrombosis) ([place]) 2011  . Gout   . Hyperlipidemia   . Hypertension   . Peripheral arterial disease ([place])     Significant Hospital Events     Consults:  Palliative care  Procedures:    Significant Diagnostic Tests:  PFT 04/10/2017 >> moderately severe obstruction without a bronchodilator response, normal volumes, decreased diffusion capacity that corrects to normal range when adjusted for alveolar volume  Micro Data:  SARS CoV2 7/11 >> negative Blood 7/11 >> negative  Antimicrobials:  Azithromycin 7/11 >> 7/16  Interim history/subjective:    Objective   Blood pressure (!) 142/62,  pulse 90, temperature 97.9 F (36.6 C), temperature source Oral, resp. rate 20, height 5\' 8"  (1.727 m), weight 103.4 kg, SpO2 92 %.    FiO2 (%):  [40 %] 40 %   Intake/Output Summary (Last 24 hours) at 07/20/2019 1328 Last data filed at 07/20/2019 0900 Gross per 24 hour  Intake 120 ml  Output 1450 ml  Net -1330 ml   Filed Weights   07/18/19 0431 07/19/19 0508 07/20/19 0425  Weight: 106.1 kg 103.9 kg 103.4 kg    Examination: General: ill appearing weak gentleman, in no distress on  O2 HENT: UA secretions heard, seems to clear them with cough, no stridor Lungs: very distant, scattered exp wheezes Cardiovascular: regular, no M Abdomen: soft, non-distended, + BS Extremities: no edema Neuro: awake, very decreased hearing - depends on reading lips. Moves all ext but globally weak Skin: RUE is dressed, many ecchymoses BUE and [facility] Problem list     Assessment & Plan:   Severe COPD with acute exacerbation and associated hypoxemia.  Unsure how much of his current symptom burden is due to the acute flare versus just his chronic COPD and chronic respiratory failure. -Continue scheduled DuoNeb, Pulmicort, albuterol as needed.  Could consider continuing chronic nebulized therapy (this regimen) if we feel he is getting better delivery of this.  Alternatively could change back to his home Anoro versus an alternative long-acting BD that contains an ICS at the time of discharge -Would plan long prednisone taper at time of  discharge.  He may actually benefit from staying on low-dose prednisone going forward.  Would consider leaving him on prednisone 10 mg daily long-term -suspect that he will require o2 going forward. Will need to titrate with ambulation / exertion.  -May benefit at some point from pulmonary rehab -Agree with Mucinex twice daily  Weakness, suspect a component of deconditioning, consider chronic occult hypoxemia that is only now identified.  Consider component of  his thyroid disease, TSH is high normal at 4.42. This may be a multifactorial issue with resultant failure to thrive on his own at home, more subacute presentation -Agree that he should not go home to live by himself. -Agree with DNR status    Labs   CBC: Recent Labs  Lab 07/13/19 2122 07/14/19 1324 07/15/19 0407 07/17/19 0759 07/18/19 0257  WBC 11.0* 9.3 11.5* 11.5* 10.1  NEUTROABS  --   --   --  10.4* 8.3*  HGB 12.8* 11.9* 11.5* 11.7* 11.4*  HCT 39.8 38.3* 36.8* 39.2 36.5*  MCV 89.0 90.5 91.8 94.0 92.6  PLT 244 225 248 258 517    Basic Metabolic Panel: Recent Labs  Lab 07/13/19 2122 07/14/19 1324 07/15/19 0407 07/15/19 0407 07/15/19 1642 07/16/19 0451 07/17/19 0759 07/17/19 1753 07/18/19 0257  NA 128*  --  128*  --  129* 128* 132*  --  133*  K 4.3  --  4.8   < > 5.3* 5.2* 5.6* 5.2* 4.3  CL 93*  --  93*  --  94* 93* 93*  --  89*  CO2 28  --  27  --  28 29 32  --  36*  GLUCOSE 129*  --  240*  --  167* 164* 166*  --  163*  BUN 16  --  22  --  23 23 24*  --  23  CREATININE 0.74   < > 1.02  --  0.98 0.86 0.94  --  0.95  CALCIUM 8.4*  --  8.9  --  8.7* 8.5* 8.8*  --  8.7*  MG 2.0  --   --   --   --   --   --   --   --    < > = values in this interval not displayed.   GFR: Estimated Creatinine Clearance: 65.1 mL/min (by C-G formula based on SCr of 0.95 mg/dL). Recent Labs  Lab 07/13/19 2122 07/13/19 2122 07/14/19 1324 07/15/19 0407 07/17/19 0759 07/18/19 0257  WBC 11.0*   < > 9.3 11.5* 11.5* 10.1  LATICACIDVEN 1.1  --   --   --   --   --    < > = values in this interval not displayed.    Liver Function Tests: Recent Labs  Lab 07/13/19 2122 07/15/19 0407  AST 41 32  ALT 21 25  ALKPHOS 90 76  BILITOT 1.1 1.0  PROT 6.5 6.0*  ALBUMIN 3.4* 3.1*   No results for input(s): LIPASE, AMYLASE in the last 168 hours. No results for input(s): AMMONIA in the last 168 hours.  ABG    Component Value Date/Time   HCO3 30.8 (H) 07/15/2019 1642   TCO2 23  07/04/2016 1548   O2SAT 87.3 07/15/2019 1642     Coagulation Profile: Recent Labs  Lab 07/13/19 2122  INR 1.0    Cardiac Enzymes: No results for input(s): CKTOTAL, CKMB, CKMBINDEX, TROPONINI in the last 168 hours.  HbA1C: Hgb A1c MFr Bld  Date/Time Value Ref Range Status  07/14/2019 01:36 PM  7.8 (H) 4.8 - 5.6 % Final    Comment:    (NOTE) Pre diabetes:          5.7%-6.4%  Diabetes:              >6.4%  Glycemic control for   <7.0% adults with diabetes   07/05/2016 03:17 PM 7.6 (H) 4.8 - 5.6 % Final    Comment:    (NOTE)         Pre-diabetes: 5.7 - 6.4         Diabetes: >6.4         Glycemic control for adults with diabetes: <7.0     CBG: Recent Labs  Lab 07/19/19 0803 07/19/19 1148 07/19/19 1613 07/19/19 2142 07/20/19 1158  GLUCAP 187* 199* 175* 187* 189*    Review of Systems:   As per HPI  Past Medical History  He,  has a past medical history of COPD (chronic obstructive pulmonary disease) ([place]), Coronary heart disease, Diabetes mellitus, DVT (deep venous thrombosis) ([place]) (2011), Gout, Hyperlipidemia, Hypertension, and Peripheral arterial disease ([place]).   Surgical History    Past Surgical History:  Procedure Laterality Date  . CARDIAC CATHETERIZATION  1994   w/ PTCA   . COLONOSCOPY     w/ polyp resection  . TONSILLECTOMY  as a child     Social History   reports that he quit smoking about 18 years ago. His smoking use included cigarettes. He has a 120.00 pack-year smoking history. He has quit using smokeless tobacco.  His smokeless tobacco use included chew. He reports that he does not drink alcohol and does not use drugs.   Family History   His family history includes Diabetes in his brother, brother, father, and mother.   Allergies Allergies  Allergen Reactions  . Actos [Pioglitazone Hydrochloride] Other (See Comments)    RIGHT LEG SWELLING   . Metformin And Related Other (See Comments)    Reaction unknown     Home Medications  Prior  to Admission medications   Medication Sig Start Date End Date Taking? Authorizing Provider  Acetaminophen 500 MG capsule Take 1,000 mg by mouth every 4 (four) hours as needed for pain.   Yes [provider]  albuterol (PROAIR HFA) 108 (90 BASE) MCG/ACT inhaler Inhale 2 puffs into the lungs every 6 (six) hours as needed for wheezing or shortness of breath.    Yes [provider]  allopurinol (ZYLOPRIM) 100 MG tablet Take 100 mg by mouth daily.  10/13/14  Yes [provider]  ANORO ELLIPTA 62.5-25 MCG/INH AEPB USE 1 INHALATION ORALLY ONE TIME DAILY Patient taking differently: Inhale 1 puff into the lungs daily.  07/24/18  Yes Collene Gobble, MD  aspirin 81 MG tablet Take 81 mg by mouth daily.    Yes [provider]  cetirizine (ZYRTEC) 10 MG tablet Take 10 mg by mouth daily as needed for allergies.   Yes [provider]  docusate sodium (STOOL SOFTENER) 100 MG capsule Take 200 mg by mouth 2 (two) times daily.    Yes [provider]  DULoxetine (CYMBALTA) 30 MG capsule Take 30 mg by mouth daily.   Yes [provider]  Ferrous Sulfate (IRON) 325 (65 Fe) MG TABS Take 325 mg by mouth daily.   Yes [provider]  furosemide (LASIX) 20 MG tablet Take 20 mg by mouth daily.  08/27/17  Yes [provider]  HUMALOG KWIKPEN 100 UNIT/ML KwikPen Inject 8 Units into the skin  daily with lunch.  09/25/17  Yes [provider]  levothyroxine (SYNTHROID) 75 MCG tablet Take 75 mcg by mouth daily before breakfast.  05/08/18  Yes [provider]  losartan (COZAAR) 50 MG tablet Take 50 mg by mouth daily.  06/29/17  Yes [provider]  omeprazole (PRILOSEC) 20 MG capsule Take 20 mg by mouth 2 (two) times daily.   Yes [provider]  rosuvastatin (CRESTOR) 40 MG tablet Take 40 mg by mouth daily.     Yes [provider]  TRESIBA FLEXTOUCH 100 UNIT/ML SOPN FlexTouch Pen Inject 26 Units into the skin in  the morning.  11/10/17  Yes [provider]  BD PEN NEEDLE NANO U/F 32G X 4 MM MISC  09/21/17   [provider]  olmesartan (BENICAR) 40 MG tablet One half daily Patient not taking: Reported on 07/14/2019 01/26/11   Tanda Rockers, MD  polyethylene glycol (MIRALAX / Floria Raveling) packet Take 17 g by mouth daily as needed for moderate constipation. Patient not taking: Reported on 07/14/2019 07/08/16   Rai, Vernelle Emerald, MD  predniSONE (DELTASONE) 10 MG tablet 40mg  daily for 3 days, then 30mg  daily for 3 days, then 20mg  daily for 3 days, then 10mg  daily for 3 days, then STOP. Patient not taking: Reported on 07/14/2019 09/02/18   Collene Gobble, MD     Critical care time: NA     Baltazar Apo, MD, PhD 07/20/2019, 2:27 PM [facility] [phone] or if no answer [phone]

## 2019-07-20 NOTE — Progress Notes (Signed)
RT called to patient beside. RN placed patient on BIPAP. Patient was having some shortness of breath. RT assessed patient. Patient looks more comfortable and resting on BIPAP.

## 2019-07-20 NOTE — Progress Notes (Signed)
Ernest Clark was lying in bed awake and alert when I arrived.  He said he wanted to get saved. He talked about family members who had passed, his brother, his mother (whom he described as a wonderful lady) and his daughter by his first wife.  He said he has been through a lot.  He said he just wanted to make sure he was saved.  We talked about salvation and how that looks for him.  We prayed a prayer from Romans 8:9-10.  After the prayer he said he felt like he is ready.  He affirmed that he is ready when God is ready.  Pt was very satisfied and thankful. He mentioned that he was hot which I reported to unit nurse prior to leave.   He also expressed concern for what was on tv (a soccer game) and said it was driving him batty seeing them go round and round.  I asked him what he like and he said baseball and I was able to find a game for him.  Please page if additional support is needed. [place], [facility]   07/20/19 1900  Clinical Encounter Type  Visited With Patient

## 2019-07-21 ENCOUNTER — Encounter (HOSPITAL_COMMUNITY): Payer: Self-pay | Admitting: Internal Medicine

## 2019-07-21 DIAGNOSIS — J441 Chronic obstructive pulmonary disease with (acute) exacerbation: Secondary | ICD-10-CM

## 2019-07-21 LAB — BASIC METABOLIC PANEL
Anion gap: 8 (ref 5–15)
BUN: 21 mg/dL (ref 8–23)
CO2: 32 mmol/L (ref 22–32)
Calcium: 8.5 mg/dL — ABNORMAL LOW (ref 8.9–10.3)
Chloride: 89 mmol/L — ABNORMAL LOW (ref 98–111)
Creatinine, Ser: 0.79 mg/dL (ref 0.61–1.24)
GFR calc Af Amer: >60 mL/min (ref >60)
GFR calc non Af Amer: >60 mL/min (ref >60)
Glucose, Bld: 183 mg/dL — ABNORMAL HIGH (ref 70–99)
Potassium: 4.7 mmol/L (ref 3.5–5.1)
Sodium: 129 mmol/L — ABNORMAL LOW (ref 135–145)

## 2019-07-21 LAB — GLUCOSE, CAPILLARY
Glucose-Capillary: 131 mg/dL — ABNORMAL HIGH (ref 70–99)
Glucose-Capillary: 171 mg/dL — ABNORMAL HIGH (ref 70–99)
Glucose-Capillary: 181 mg/dL — ABNORMAL HIGH (ref 70–99)
Glucose-Capillary: 160 mg/dL — ABNORMAL HIGH (ref 70–99)
Glucose-Capillary: 193 mg/dL — ABNORMAL HIGH (ref 70–99)
Glucose-Capillary: 187 mg/dL — ABNORMAL HIGH (ref 70–99)

## 2019-07-21 MED ORDER — PREDNISONE 20 MG PO TABS
40.0000 mg | ORAL_TABLET | Freq: Every day | ORAL | Status: DC
  Administered 2019-07-22: 40 mg via ORAL
  Filled 2019-07-21: qty 2

## 2019-07-21 MED ORDER — FUROSEMIDE 10 MG/ML IJ SOLN
20.0000 mg | Freq: Once | INTRAMUSCULAR | Status: AC
  Administered 2019-07-21: 20 mg via INTRAVENOUS
  Filled 2019-07-21: qty 2

## 2019-07-21 MED ORDER — PREDNISONE 10 MG PO TABS: 10.0000 mg | ORAL_TABLET | Freq: Every day | ORAL | Status: DC

## 2019-07-21 NOTE — Progress Notes (Signed)
NAME:  Ernest Withers., MRN:  [id number], DOB:  10-17-1931, LOS: 6 ADMISSION DATE:  07/13/2019, CONSULTATION DATE: 07/20/2019 REFERRING MD:  Dr Tawanna Solo, CHIEF COMPLAINT:  Acute exacerbation COPD   Brief History     History of present illness   86 year old former smoker with CAD, hypertension, hyperlipidemia, type 2 diabetes, hypothyroidism, peripheral vascular disease.  I have followed him in office for severe COPD, last seen 07/2018.  He is not on home oxygen.  He was admitted on 7/12 for generalized weakness and after a fall from the bed that resulted in a right forearm skin tear.  He required initiation of 2 L/min nasal cannula O2.  Urinalysis had rare bacteria.  No infiltrates on chest x-ray.  COVID-19 testing negative.  Bilateral expiratory wheezes were documented on exam he was admitted for an acute exacerbation of COPD, treated with azithromycin, scheduled bronchodilators, Solu-Medrol.  He states that he still feels weak.  When asked why he had to come to the hospital he principally blames his right arm injury and skin tear.  He does note dyspnea and cough.  Admits to being very weak   Past Medical History   Past Medical History:  Diagnosis Date  . COPD (chronic obstructive pulmonary disease) ([place])   . Coronary heart disease    remote PCI  . Diabetes mellitus   . DVT (deep venous thrombosis) ([place]) 2011  . Gout   . Hyperlipidemia   . Hypertension   . Peripheral arterial disease ([place])     Significant Hospital Events     Consults:  Palliative care  Procedures:    Significant Diagnostic Tests:  PFT 04/10/2017 >> moderately severe obstruction without a bronchodilator response, normal volumes, decreased diffusion capacity that corrects to normal range when adjusted for alveolar volume  Micro Data:  SARS CoV2 7/11 >> negative Blood 7/11 >> negative  Antimicrobials:  Azithromycin 7/11 >> 7/16  Interim history/subjective:  Seems to be confused.  Saturating adequately.   He has been on and off BiPAP, asks for it when he needs it for work of breathing  Objective   Blood pressure 129/68, pulse 81, temperature 97.6 F (36.4 C), temperature source Oral, resp. rate 20, height 5\' 8"  (1.727 m), weight 100.2 kg, SpO2 93 %.    FiO2 (%):  [40 %] 40 %   Intake/Output Summary (Last 24 hours) at 07/21/2019 1249 Last data filed at 07/21/2019 0946 Gross per 24 hour  Intake 460 ml  Output 1100 ml  Net -640 ml   Filed Weights   07/19/19 0508 07/20/19 0425 07/21/19 0238  Weight: 103.9 kg 103.4 kg 100.2 kg    Examination: Exam is unchanged compared with 7/18 General: ill appearing weak gentleman, in no distress on [place] O2 HENT: UA secretions heard, seems to clear them with cough, no stridor Lungs: very distant, scattered exp wheezes Cardiovascular: regular, no M Abdomen: soft, non-distended, + BS Extremities: no edema Neuro: awake, very decreased hearing - depends on reading lips. Moves all ext but globally weak Skin: RUE is dressed, many ecchymoses BUE and [facility] Problem list     Assessment & Plan:   Severe COPD with acute exacerbation and associated hypoxemia.  This presentation seems to be more subacute than an acute exacerbation of COPD.  He has chronic dyspnea, chronic work of breathing and an overall failure to thrive.  I am concerned about his ability to go home, do not believe it would be safe for him to do so.  I agree with input from palliative care.  Would continue his DuoNeb, Pulmicort.  Continue either this at discharge or change him back to his usual Anoro.  Agree with the long prednisone taper.  He may ultimately benefit from being on daily low-dose prednisone, such as 10 mg daily.  He will need oxygen for home.  Agree with Mucinex twice daily  Weakness, suspect a component of deconditioning, consider chronic occult hypoxemia that is only now identified.  Consider component of his thyroid disease, TSH is high normal at 4.42. This looks  like a multifactorial issue with resultant failure to thrive on his own at home, more subacute presentation -He clearly cannot go home by himself.  Agree with DNR status    Labs   CBC: Recent Labs  Lab 07/14/19 1324 07/15/19 0407 07/17/19 0759 07/18/19 0257  WBC 9.3 11.5* 11.5* 10.1  NEUTROABS  --   --  10.4* 8.3*  HGB 11.9* 11.5* 11.7* 11.4*  HCT 38.3* 36.8* 39.2 36.5*  MCV 90.5 91.8 94.0 92.6  PLT 225 248 258 144    Basic Metabolic Panel: Recent Labs  Lab 07/15/19 1642 07/15/19 1642 07/16/19 0451 07/17/19 0759 07/17/19 1753 07/18/19 0257 07/21/19 0506  NA 129*  --  128* 132*  --  133* 129*  K 5.3*   < > 5.2* 5.6* 5.2* 4.3 4.7  CL 94*  --  93* 93*  --  89* 89*  CO2 28  --  29 32  --  36* 32  GLUCOSE 167*  --  164* 166*  --  163* 183*  BUN 23  --  23 24*  --  23 21  CREATININE 0.98  --  0.86 0.94  --  0.95 0.79  CALCIUM 8.7*  --  8.5* 8.8*  --  8.7* 8.5*   < > = values in this interval not displayed.   GFR: Estimated Creatinine Clearance: 76 mL/min (by C-G formula based on SCr of 0.79 mg/dL). Recent Labs  Lab 07/14/19 1324 07/15/19 0407 07/17/19 0759 07/18/19 0257  WBC 9.3 11.5* 11.5* 10.1    Liver Function Tests: Recent Labs  Lab 07/15/19 0407  AST 32  ALT 25  ALKPHOS 76  BILITOT 1.0  PROT 6.0*  ALBUMIN 3.1*   No results for input(s): LIPASE, AMYLASE in the last 168 hours. No results for input(s): AMMONIA in the last 168 hours.  ABG    Component Value Date/Time   HCO3 30.8 (H) 07/15/2019 1642   TCO2 23 07/04/2016 1548   O2SAT 87.3 07/15/2019 1642     Coagulation Profile: No results for input(s): INR, PROTIME in the last 168 hours.  Cardiac Enzymes: No results for input(s): CKTOTAL, CKMB, CKMBINDEX, TROPONINI in the last 168 hours.  HbA1C: Hgb A1c MFr Bld  Date/Time Value Ref Range Status  07/14/2019 01:36 PM 7.8 (H) 4.8 - 5.6 % Final    Comment:    (NOTE) Pre diabetes:          5.7%-6.4%  Diabetes:              >6.4%  Glycemic  control for   <7.0% adults with diabetes   07/05/2016 03:17 PM 7.6 (H) 4.8 - 5.6 % Final    Comment:    (NOTE)         Pre-diabetes: 5.7 - 6.4         Diabetes: >6.4         Glycemic control for adults with diabetes: <7.0  CBG: Recent Labs  Lab 07/20/19 1634 07/20/19 2018 07/20/19 2313 07/21/19 0744 07/21/19 1142  GLUCAP 146* 248* 171* 181* 160*    Critical care time: NA     Baltazar Apo, MD, PhD 07/21/2019, 12:49 PM [facility] [phone] or if no answer [phone]

## 2019-07-21 NOTE — Care Management Important Message (Signed)
Important Message  Patient Details  Name: Ernest Clark. MRN: [id number] Date of Birth: [birth year]   Medicare Important Message Given:  Yes     Shelda Altes 07/21/2019, 11:53 AM

## 2019-07-21 NOTE — Progress Notes (Signed)
Patient currently resting comfortably on nasal cannula. BIPAP not needed at this time.

## 2019-07-21 NOTE — Plan of Care (Signed)
  Problem: Clinical Measurements: Goal: Will remain free from infection Outcome: Progressing   Problem: Clinical Measurements: Goal: Ability to maintain clinical measurements within normal limits will improve Outcome: Not Progressing

## 2019-07-21 NOTE — Progress Notes (Signed)
PROGRESS NOTE    Ernest Clark.  KDT:[id number] DOB: 13-Nov-1931 DOA: 07/13/2019 PCP: Hulan Fess, MD   Brief Narrative:  Patient is a 86 year old male with history of COPD not on home oxygen, hypertension, hyperlipidemia, coronary artery disease, diabetes type 2, peripheral artery disease, chronic hyponatremia, former tobacco/alcohol abuse presents to the emergency department with complaints of generalized weakness and also had a fall from his bed sustaining a skin tear on his right forearm.  Patient lives alone.  He has been increasingly weak and had trouble with ALDs.  He was admitted for the management of COPD exacerbation/hypoxic respiratory failure.  Palliative care also consulted  due to his persistent respiratory failure for possible goals of care.  Respiratory status is improving but very slow and hospital course remarkable for persistent wheezing.PCCM also consulted.Possible plan for DC to SNF tomorrow if his respiratory status improves.  Assessment & Plan:   Principal Problem:   Acute hypoxemic respiratory failure (HCC) Active Problems:   Coronary heart disease   Essential hypertension   Hyperlipidemia   Hyponatremia   Type 2 diabetes mellitus without complication, with long-term current use of insulin (HCC)   COPD with acute exacerbation (HCC)   Elevated troponin   COPD exacerbation (HCC)   Palliative care encounter   Acute hypoxic respiratory failure/COPD exacerbation: Presented with rhonchi, wheezing, respiratory distress.  Started on IV steroids, Z-Pak, bronchodilators.  Currently on 2 L oxygen via nasal cannula.  He was on BiPAP till 07/16/2019. Chest x-ray on admission showed chronic emphysematous changes without pneumonia.Last follow-up chest x-ray showed increased interstitial densities bilaterally which may reflect mild edema or atypical infection superimposed on chronic lung disease. Palliative care was also following regarding his poor prognosis, advanced  age. Respiratory status continues to improve but very slow and he had  significant wheezes.  Still coughing.PCCM consulted who recommended to continue current care. Last CXR on 07/20/2019 showed increasing interstitial opacities throughout the right lung, unchanged on the left, concerning for worsening asymmetric edema or atypical infection.  He has completed the course of azithromycin. Echocardiogram showed ejection fraction of 70 to 75%, indeterminate diastolic parameters. We will give him another dose of Lasix IV 20 mg. He will be continued on Pulmicort and prednisone 10 mg daily on discharge after finishing 5 days course of 40 mg of prednisone.  His COPD looks advanced.  Heart block: EKG showed Wenckebach phenomena.  Cardiology was consulted.Cardiology recommended to avoid AV nodal blockers, no indication for pacemaker.  Hyponatremia:129 today.  He has  history of chronic hyponatremia.    Hyperkalemia: Resolved with lokelma  Coronary artery disease/peripheral artery disease: Continue aspirin, Crestor  Hypertension: Blood pressure stable.  Losartan on hold.  Type 2 diabetes mellitus: Continue sliding scale insulin.  Takes Tyler Aas at home.  Goals of care: Elderly with advanced COPD.  Palliative care consulted for goals of care.  DNR.  Debility/deconditioning:He will need skilled nursing facility on discharge.PT/OT evaluation done  Twitching of extremities: Unknown etiology. CT head was negative for any acute intracranial abnormalities.  Patient is alert and oriented.  Right arm laceration: Managed  in the emergency department.  Continue pain management/wound care  Peripheral artery disease: Continue aspirin, statin          DVT prophylaxis:Lovenox Code Status: DNR Family Communication:Son at  Bedside on 07/19/19.called again today,phone not received Status is: Inpatient  Remains inpatient appropriate because:Hemodynamically unstable   Dispo: The patient is from: Home  Anticipated d/c is to: SNF              Anticipated d/c date is: tomorrow              Patient currently is not medically stable for DC Still sounds congested, had wheezes today not ready for discharge.  Consultants: None  Procedures:None  Antimicrobials:  Anti-infectives (From admission, onward)   Start     Dose/Rate Route Frequency Ordered Stop   07/15/19 1000  azithromycin (ZITHROMAX) tablet 250 mg  Status:  Discontinued        250 mg Oral Daily 07/15/19 0948 07/18/19 1357   07/14/19 1315  azithromycin (ZITHROMAX) 500 mg in sodium chloride 0.9 % 250 mL IVPB  Status:  Discontinued        500 mg 250 mL/hr over 60 Minutes Intravenous Every 24 hours 07/14/19 1303 07/15/19 0948      Subjective: Patient seen and examined at the bedside this morning.  Hemodynamically stable.  On 2 L of oxygen per minute.  He was concerned about blood in his mucus today which could be from dry nose from oxygen.  Still have mild wheezing and congestion.  Objective: Vitals:   07/21/19 0109 07/21/19 0238 07/21/19 0348 07/21/19 0728  BP: (!) 150/74  (!) 153/67 (!) 158/92  Pulse: 81  86 89  Resp: 20  20 20   Temp: (!) 97.5 F (36.4 C)  97.7 F (36.5 C) 98.1 F (36.7 C)  TempSrc: Axillary  Axillary Oral  SpO2: 95%  95% 92%  Weight:  100.2 kg    Height:        Intake/Output Summary (Last 24 hours) at 07/21/2019 0806 Last data filed at 07/21/2019 0548 Gross per 24 hour  Intake 460 ml  Output 1100 ml  Net -640 ml   Filed Weights   07/19/19 0508 07/20/19 0425 07/21/19 0238  Weight: 103.9 kg 103.4 kg 100.2 kg    Examination:  General exam: Extremely deconditioned, debilitated elderly male HEENT:PERRL,Oral mucosa moist, Ear/Nose normal on gross exam Respiratory system: Bilateral decreased air entry, bilateral rhonchi, wheezes  cardiovascular system: S1 & S2 heard, RRR. No JVD, murmurs, rubs, gallops or clicks. Gastrointestinal system: Abdomen is nondistended, soft and nontender. No  organomegaly or masses felt. Normal bowel sounds heard. Central nervous system: Alert and awake, very hard of hearing Extremities: No edema, no clubbing ,no cyanosis Skin: Right forearm wrapped with dressing, no icterus or pallor    Data Reviewed: I have personally reviewed following labs and imaging studies  CBC: Recent Labs  Lab 07/14/19 1324 07/15/19 0407 07/17/19 0759 07/18/19 0257  WBC 9.3 11.5* 11.5* 10.1  NEUTROABS  --   --  10.4* 8.3*  HGB 11.9* 11.5* 11.7* 11.4*  HCT 38.3* 36.8* 39.2 36.5*  MCV 90.5 91.8 94.0 92.6  PLT 225 248 258 621   Basic Metabolic Panel: Recent Labs  Lab 07/15/19 1642 07/15/19 1642 07/16/19 0451 07/17/19 0759 07/17/19 1753 07/18/19 0257 07/21/19 0506  NA 129*  --  128* 132*  --  133* 129*  K 5.3*   < > 5.2* 5.6* 5.2* 4.3 4.7  CL 94*  --  93* 93*  --  89* 89*  CO2 28  --  29 32  --  36* 32  GLUCOSE 167*  --  164* 166*  --  163* 183*  BUN 23  --  23 24*  --  23 21  CREATININE 0.98  --  0.86 0.94  --  0.95 0.79  CALCIUM 8.7*  --  8.5* 8.8*  --  8.7* 8.5*   < > = values in this interval not displayed.   GFR: Estimated Creatinine Clearance: 76 mL/min (by C-G formula based on SCr of 0.79 mg/dL). Liver Function Tests: Recent Labs  Lab 07/15/19 0407  AST 32  ALT 25  ALKPHOS 76  BILITOT 1.0  PROT 6.0*  ALBUMIN 3.1*   No results for input(s): LIPASE, AMYLASE in the last 168 hours. No results for input(s): AMMONIA in the last 168 hours. Coagulation Profile: No results for input(s): INR, PROTIME in the last 168 hours. Cardiac Enzymes: No results for input(s): CKTOTAL, CKMB, CKMBINDEX, TROPONINI in the last 168 hours. BNP (last 3 results) No results for input(s): PROBNP in the last 8760 hours. HbA1C: No results for input(s): HGBA1C in the last 72 hours. CBG: Recent Labs  Lab 07/19/19 2142 07/20/19 1158 07/20/19 1634 07/20/19 2018 07/20/19 2313  GLUCAP 187* 189* 146* 248* 171*   Lipid Profile: No results for input(s): CHOL,  HDL, LDLCALC, TRIG, CHOLHDL, LDLDIRECT in the last 72 hours. Thyroid Function Tests: No results for input(s): TSH, T4TOTAL, FREET4, T3FREE, THYROIDAB in the last 72 hours. Anemia Panel: No results for input(s): VITAMINB12, FOLATE, FERRITIN, TIBC, IRON, RETICCTPCT in the last 72 hours. Sepsis Labs: No results for input(s): PROCALCITON, LATICACIDVEN in the last 168 hours.  Recent Results (from the past 240 hour(s))  Culture, blood (single) w Reflex to ID Panel     Status: None   Collection Time: 07/13/19  9:00 PM   Specimen: BLOOD LEFT FOREARM  Result Value Ref Range Status   Specimen Description   Final    BLOOD LEFT FOREARM Performed at [facility], [address] [place], [place] [address]    Special Requests   Final    BOTTLES DRAWN AEROBIC AND ANAEROBIC Blood Culture adequate volume Performed at [facility], [address] [place], [place] [address]    Culture   Final    NO GROWTH 5 DAYS Performed at [facility], [place] [address], [place], [place] [address]    Report Status 07/19/2019 FINAL  Final  SARS Coronavirus 2 by RT PCR (hospital order, performed in [facility] hospital lab) Nasopharyngeal Nasopharyngeal Swab     Status: None   Collection Time: 07/13/19  9:47 PM   Specimen: Nasopharyngeal Swab  Result Value Ref Range Status   SARS Coronavirus 2 NEGATIVE NEGATIVE Final    Comment: (NOTE) SARS-CoV-2 target nucleic acids are NOT DETECTED.  The SARS-CoV-2 RNA is generally detectable in upper and lower respiratory specimens during the acute phase of infection. The lowest concentration of SARS-CoV-2 viral copies this assay can detect is 250 copies / mL. A negative result does not preclude SARS-CoV-2 infection and should not be used as the sole basis for treatment or other patient management decisions.  A negative result may occur with improper specimen collection / handling, submission of specimen other than nasopharyngeal swab, presence of  viral mutation(s) within the areas targeted by this assay, and inadequate number of viral copies (<250 copies / mL). A negative result must be combined with clinical observations, patient history, and epidemiological information.  Fact Sheet for Patients:   StrictlyIdeas.no  Fact Sheet for Healthcare Providers: BankingDealers.co.za  This test is not yet approved or  cleared by the [place] FDA and has been authorized for detection and/or diagnosis of SARS-CoV-2 by FDA under an Emergency Use Authorization (EUA).  This EUA  will remain in effect (meaning this test can be used) for the duration of the COVID-19 declaration under Section 564(b)(1) of the Act, 21 U.S.C. section 360bbb-3(b)(1), unless the authorization is terminated or revoked sooner.  Performed at [facility], [address], [place], [place] [address]          Radiology Studies: DG Chest 1 View  Result Date: 07/20/2019 CLINICAL DATA:  Pt has SOB with acute hypoxemic respiratory failure EXAM: CHEST  1 VIEW COMPARISON:  Chest radiograph 07/17/2019 FINDINGS: Stable enlarged cardiomediastinal contours. Aortic arch calcification. There are increasing interstitial opacities throughout the right lung. Persistent basilar predominant infiltrates in the left lung. No pneumothorax or large pleural effusion. IMPRESSION: Increasing interstitial opacities throughout the right lung, unchanged on the left, concerning for worsening asymmetric edema or atypical infection. Electronically Signed   By: Audie Pinto M.D.   On: 07/20/2019 16:01   ECHOCARDIOGRAM COMPLETE  Result Date: 07/20/2019    ECHOCARDIOGRAM REPORT   Patient Name:   Ernest Clark. Date of Exam: 07/20/2019 Medical Rec #:  [id number]           Height:       68.0 in Accession #:    [id number]          Weight:       228.0 lb Date of Birth:  1931/08/16           BSA:          2.161 m Patient Age:    34 years             BP:           127/67 mmHg Patient Gender: M                   HR:           88 bpm. Exam Location:  Inpatient Procedure: 2D Echo Indications:    dyspnea 786.09  History:        Patient has no prior history of Echocardiogram examinations.                 COPD, Signs/Symptoms:elevated troponin; Risk                 Factors:Hypertension, Dyslipidemia and Diabetes.  Sonographer:    Johny Chess Referring Phys: [id number] Aleisha Paone  Sonographer Comments: Image acquisition challenging due to COPD, Image acquisition challenging due to respiratory motion and very hard of hearing. IMPRESSIONS  1. Left ventricular ejection fraction, by estimation, is 70 to 75%. The left ventricle has hyperdynamic function. The left ventricle has no regional wall motion abnormalities. There is mild left ventricular hypertrophy. Left ventricular diastolic parameters are indeterminate.  2. Right ventricular systolic function is normal. The right ventricular size is normal.  3. Left atrial size was mildly dilated.  4. The mitral valve is abnormal. No evidence of mitral valve regurgitation.  5. The aortic valve is abnormal. Aortic valve regurgitation is trivial. Mild to moderate aortic valve sclerosis/calcification is present, without any evidence of aortic stenosis.  6. The inferior vena cava is normal in size with greater than 50% respiratory variability, suggesting right atrial pressure of 3 mmHg. FINDINGS  Left Ventricle: Left ventricular ejection fraction, by estimation, is 70 to 75%. The left ventricle has hyperdynamic function. The left ventricle has no regional wall motion abnormalities. The left ventricular internal cavity size was normal in size. There is mild left ventricular hypertrophy. Left ventricular diastolic parameters are  indeterminate. Right Ventricle: The right ventricular size is normal. No increase in right ventricular wall thickness. Right ventricular systolic function is normal. Left Atrium: Left atrial  size was mildly dilated. Right Atrium: Right atrial size was normal in size. Pericardium: There is no evidence of pericardial effusion. Mitral Valve: The mitral valve is abnormal. There is mild thickening of the mitral valve leaflet(s). Mild mitral annular calcification. No evidence of mitral valve regurgitation. MV peak gradient, 27.4 mmHg. The mean mitral valve gradient is 13.0 mmHg. Tricuspid Valve: The tricuspid valve is normal in structure. Tricuspid valve regurgitation is trivial. Aortic Valve: The aortic valve is abnormal. Aortic valve regurgitation is trivial. Mild to moderate aortic valve sclerosis/calcification is present, without any evidence of aortic stenosis. Pulmonic Valve: The pulmonic valve was normal in structure. Pulmonic valve regurgitation is trivial. Aorta: The aortic root is normal in size and structure. Venous: The inferior vena cava is normal in size with greater than 50% respiratory variability, suggesting right atrial pressure of 3 mmHg. IAS/Shunts: No atrial level shunt detected by color flow Doppler.  LEFT VENTRICLE PLAX 2D LVIDd:         3.30 cm  Diastology LVIDs:         1.80 cm  LV e' lateral: 7.29 cm/s LVOT diam:     1.90 cm  LV e' medial:  6.74 cm/s LV SV:         75 LV SV Index:   35 LVOT Area:     2.84 cm  RIGHT VENTRICLE             IVC RV S prime:     15.20 cm/s  IVC diam: 1.90 cm TAPSE (M-mode): 2.1 cm LEFT ATRIUM             Index       RIGHT ATRIUM           Index LA diam:        3.40 cm 1.57 cm/m  RA Area:     10.30 cm LA Vol (A2C):   51.5 ml 23.84 ml/m RA Volume:   20.00 ml  9.26 ml/m LA Vol (A4C):   78.8 ml 36.47 ml/m LA Biplane Vol: 68.3 ml 31.61 ml/m  AORTIC VALVE LVOT Vmax:   141.00 cm/s LVOT Vmean:  89.700 cm/s LVOT VTI:    0.265 m  AORTA Ao Root diam: 3.20 cm Ao Asc diam:  2.90 cm MITRAL VALVE MV Peak grad: 27.4 mmHg  SHUNTS MV Mean grad: 13.0 mmHg  Systemic VTI:  0.26 m MV Vmax:      2.62 m/s   Systemic Diam: 1.90 cm MV Vmean:     166.5 cm/s Dorris Carnes MD  Electronically signed by Dorris Carnes MD Signature Date/Time: 07/20/2019/6:01:23 PM    Final         Scheduled Meds: . allopurinol  100 mg Oral Daily  . aspirin EC  81 mg Oral Daily  . budesonide (PULMICORT) nebulizer solution  0.25 mg Nebulization BID  . dextromethorphan-guaiFENesin  2 tablet Oral BID  . DULoxetine  30 mg Oral Daily  . enoxaparin (LOVENOX) injection  40 mg Subcutaneous Q24H  . ferrous sulfate  325 mg Oral Daily  . insulin aspart  0-15 Units Subcutaneous TID WC  . insulin aspart  0-5 Units Subcutaneous QHS  . ipratropium-albuterol  3 mL Nebulization QID  . levothyroxine  75 mcg Oral Q0600  . methylPREDNISolone (SOLU-MEDROL) injection  40 mg Intravenous Q12H  . pantoprazole  40 mg  Oral Daily  . rosuvastatin  40 mg Oral Daily   Continuous Infusions:   LOS: 6 days    Time spent: 25 mins.More than 50% of that time was spent in counseling and/or coordination of care.      Shelly Coss, MD [facility] P7/19/2021, 8:06 AM

## 2019-07-21 NOTE — Progress Notes (Signed)
Pt resting on [place] at this time.  Pt or RN removed bipap, as pt's usual he wears for some time and then wants to come off.  Pt is comfortable and resting.  RT will continue to monitor.

## 2019-07-22 DIAGNOSIS — J188 Other pneumonia, unspecified organism: Secondary | ICD-10-CM | POA: Diagnosis not present

## 2019-07-22 DIAGNOSIS — F330 Major depressive disorder, recurrent, mild: Secondary | ICD-10-CM | POA: Diagnosis not present

## 2019-07-22 DIAGNOSIS — E871 Hypo-osmolality and hyponatremia: Secondary | ICD-10-CM | POA: Diagnosis not present

## 2019-07-22 DIAGNOSIS — R2689 Other abnormalities of gait and mobility: Secondary | ICD-10-CM | POA: Diagnosis not present

## 2019-07-22 DIAGNOSIS — J9610 Chronic respiratory failure, unspecified whether with hypoxia or hypercapnia: Secondary | ICD-10-CM | POA: Diagnosis not present

## 2019-07-22 DIAGNOSIS — J810 Acute pulmonary edema: Secondary | ICD-10-CM | POA: Diagnosis not present

## 2019-07-22 DIAGNOSIS — R05 Cough: Secondary | ICD-10-CM | POA: Diagnosis not present

## 2019-07-22 DIAGNOSIS — E038 Other specified hypothyroidism: Secondary | ICD-10-CM | POA: Diagnosis not present

## 2019-07-22 DIAGNOSIS — R41841 Cognitive communication deficit: Secondary | ICD-10-CM | POA: Diagnosis not present

## 2019-07-22 DIAGNOSIS — G4700 Insomnia, unspecified: Secondary | ICD-10-CM | POA: Diagnosis not present

## 2019-07-22 DIAGNOSIS — E119 Type 2 diabetes mellitus without complications: Secondary | ICD-10-CM | POA: Diagnosis not present

## 2019-07-22 DIAGNOSIS — E785 Hyperlipidemia, unspecified: Secondary | ICD-10-CM | POA: Diagnosis not present

## 2019-07-22 DIAGNOSIS — J984 Other disorders of lung: Secondary | ICD-10-CM | POA: Diagnosis not present

## 2019-07-22 DIAGNOSIS — E875 Hyperkalemia: Secondary | ICD-10-CM | POA: Diagnosis not present

## 2019-07-22 DIAGNOSIS — R252 Cramp and spasm: Secondary | ICD-10-CM | POA: Diagnosis not present

## 2019-07-22 DIAGNOSIS — J9601 Acute respiratory failure with hypoxia: Secondary | ICD-10-CM | POA: Diagnosis not present

## 2019-07-22 DIAGNOSIS — J449 Chronic obstructive pulmonary disease, unspecified: Secondary | ICD-10-CM | POA: Diagnosis not present

## 2019-07-22 DIAGNOSIS — M2550 Pain in unspecified joint: Secondary | ICD-10-CM | POA: Diagnosis not present

## 2019-07-22 DIAGNOSIS — E876 Hypokalemia: Secondary | ICD-10-CM | POA: Diagnosis not present

## 2019-07-22 DIAGNOSIS — I959 Hypotension, unspecified: Secondary | ICD-10-CM | POA: Diagnosis not present

## 2019-07-22 DIAGNOSIS — I498 Other specified cardiac arrhythmias: Secondary | ICD-10-CM | POA: Diagnosis not present

## 2019-07-22 DIAGNOSIS — G478 Other sleep disorders: Secondary | ICD-10-CM | POA: Diagnosis not present

## 2019-07-22 DIAGNOSIS — J441 Chronic obstructive pulmonary disease with (acute) exacerbation: Secondary | ICD-10-CM | POA: Diagnosis not present

## 2019-07-22 DIAGNOSIS — R498 Other voice and resonance disorders: Secondary | ICD-10-CM | POA: Diagnosis not present

## 2019-07-22 DIAGNOSIS — Z515 Encounter for palliative care: Secondary | ICD-10-CM | POA: Diagnosis not present

## 2019-07-22 DIAGNOSIS — R042 Hemoptysis: Secondary | ICD-10-CM | POA: Diagnosis not present

## 2019-07-22 DIAGNOSIS — J42 Unspecified chronic bronchitis: Secondary | ICD-10-CM | POA: Diagnosis not present

## 2019-07-22 DIAGNOSIS — F329 Major depressive disorder, single episode, unspecified: Secondary | ICD-10-CM | POA: Diagnosis not present

## 2019-07-22 DIAGNOSIS — E039 Hypothyroidism, unspecified: Secondary | ICD-10-CM | POA: Diagnosis not present

## 2019-07-22 DIAGNOSIS — E1165 Type 2 diabetes mellitus with hyperglycemia: Secondary | ICD-10-CM | POA: Diagnosis not present

## 2019-07-22 DIAGNOSIS — I25119 Atherosclerotic heart disease of native coronary artery with unspecified angina pectoris: Secondary | ICD-10-CM | POA: Diagnosis not present

## 2019-07-22 DIAGNOSIS — Z794 Long term (current) use of insulin: Secondary | ICD-10-CM | POA: Diagnosis not present

## 2019-07-22 DIAGNOSIS — I7389 Other specified peripheral vascular diseases: Secondary | ICD-10-CM | POA: Diagnosis not present

## 2019-07-22 DIAGNOSIS — J45909 Unspecified asthma, uncomplicated: Secondary | ICD-10-CM | POA: Diagnosis not present

## 2019-07-22 DIAGNOSIS — I10 Essential (primary) hypertension: Secondary | ICD-10-CM | POA: Diagnosis not present

## 2019-07-22 DIAGNOSIS — Z7401 Bed confinement status: Secondary | ICD-10-CM | POA: Diagnosis not present

## 2019-07-22 DIAGNOSIS — M6258 Muscle wasting and atrophy, not elsewhere classified, other site: Secondary | ICD-10-CM | POA: Diagnosis not present

## 2019-07-22 DIAGNOSIS — J9600 Acute respiratory failure, unspecified whether with hypoxia or hypercapnia: Secondary | ICD-10-CM | POA: Diagnosis not present

## 2019-07-22 DIAGNOSIS — R1312 Dysphagia, oropharyngeal phase: Secondary | ICD-10-CM | POA: Diagnosis not present

## 2019-07-22 DIAGNOSIS — R4189 Other symptoms and signs involving cognitive functions and awareness: Secondary | ICD-10-CM | POA: Diagnosis not present

## 2019-07-22 DIAGNOSIS — M6281 Muscle weakness (generalized): Secondary | ICD-10-CM | POA: Diagnosis not present

## 2019-07-22 DIAGNOSIS — H04123 Dry eye syndrome of bilateral lacrimal glands: Secondary | ICD-10-CM | POA: Diagnosis not present

## 2019-07-22 DIAGNOSIS — R634 Abnormal weight loss: Secondary | ICD-10-CM | POA: Diagnosis not present

## 2019-07-22 DIAGNOSIS — F418 Other specified anxiety disorders: Secondary | ICD-10-CM | POA: Diagnosis not present

## 2019-07-22 DIAGNOSIS — M170 Bilateral primary osteoarthritis of knee: Secondary | ICD-10-CM | POA: Diagnosis not present

## 2019-07-22 DIAGNOSIS — F419 Anxiety disorder, unspecified: Secondary | ICD-10-CM | POA: Diagnosis not present

## 2019-07-22 DIAGNOSIS — Z7952 Long term (current) use of systemic steroids: Secondary | ICD-10-CM | POA: Diagnosis not present

## 2019-07-22 DIAGNOSIS — E1140 Type 2 diabetes mellitus with diabetic neuropathy, unspecified: Secondary | ICD-10-CM | POA: Diagnosis not present

## 2019-07-22 DIAGNOSIS — E7849 Other hyperlipidemia: Secondary | ICD-10-CM | POA: Diagnosis not present

## 2019-07-22 DIAGNOSIS — R2681 Unsteadiness on feet: Secondary | ICD-10-CM | POA: Diagnosis not present

## 2019-07-22 DIAGNOSIS — E1142 Type 2 diabetes mellitus with diabetic polyneuropathy: Secondary | ICD-10-CM | POA: Diagnosis not present

## 2019-07-22 LAB — BASIC METABOLIC PANEL
Anion gap: 10 (ref 5–15)
BUN: 28 mg/dL — ABNORMAL HIGH (ref 8–23)
CO2: 33 mmol/L — ABNORMAL HIGH (ref 22–32)
Calcium: 8.7 mg/dL — ABNORMAL LOW (ref 8.9–10.3)
Chloride: 89 mmol/L — ABNORMAL LOW (ref 98–111)
Creatinine, Ser: 1 mg/dL (ref 0.61–1.24)
GFR calc Af Amer: >60 mL/min (ref >60)
GFR calc non Af Amer: >60 mL/min (ref >60)
Glucose, Bld: 166 mg/dL — ABNORMAL HIGH (ref 70–99)
Potassium: 4.2 mmol/L (ref 3.5–5.1)
Sodium: 132 mmol/L — ABNORMAL LOW (ref 135–145)

## 2019-07-22 LAB — GLUCOSE, CAPILLARY
Glucose-Capillary: 139 mg/dL — ABNORMAL HIGH (ref 70–99)
Glucose-Capillary: 137 mg/dL — ABNORMAL HIGH (ref 70–99)
Glucose-Capillary: 177 mg/dL — ABNORMAL HIGH (ref 70–99)

## 2019-07-22 LAB — SARS CORONAVIRUS 2 BY RT PCR (HOSPITAL ORDER, PERFORMED IN ~~LOC~~ HOSPITAL LAB): SARS Coronavirus 2: NEGATIVE

## 2019-07-22 MED ORDER — PREDNISONE 10 MG PO TABS: 10.0000 mg | ORAL_TABLET | Freq: Every day | ORAL | Status: DC

## 2019-07-22 MED ORDER — PREDNISONE 20 MG PO TABS: 40.0000 mg | ORAL_TABLET | Freq: Every day | ORAL | Status: AC

## 2019-07-22 MED ORDER — BUDESONIDE 0.25 MG/2ML IN SUSP: 0.2500 mg | Freq: Two times a day (BID) | RESPIRATORY_TRACT | 12 refills | Status: DC

## 2019-07-22 MED ORDER — IPRATROPIUM-ALBUTEROL 0.5-2.5 (3) MG/3ML IN SOLN: 3.0000 mL | Freq: Four times a day (QID) | RESPIRATORY_TRACT | Status: AC | PRN

## 2019-07-22 MED ORDER — FUROSEMIDE 40 MG PO TABS: 40.0000 mg | ORAL_TABLET | Freq: Every day | ORAL | Status: DC

## 2019-07-22 MED ORDER — OXYCODONE HCL 5 MG PO TABS: 5.0000 mg | ORAL_TABLET | ORAL | 0 refills | Status: DC | PRN

## 2019-07-22 NOTE — Discharge Summary (Addendum)
Physician Discharge Summary  Ernest Clark. SAY:[id number] DOB: 03-07-1931 DOA: 07/13/2019  PCP: Hulan Fess, MD  Admit date: 07/13/2019 Discharge date: 07/22/2019  Admitted From: Home Disposition:  SNF  Discharge Condition:Stable CODE STATUS: DNR Diet recommendation:Heart healthy   Brief/Interim Summary:  Patient is a 86 year old male with history of COPD not on home oxygen, hypertension, hyperlipidemia, coronary artery disease, diabetes type 2, peripheral artery disease, chronic hyponatremia, former tobacco/alcohol abuse presents to the emergency department with complaints of generalized weakness and also had a fall from his bed sustaining a skin tear on his right forearm.  Patient lives alone.  He has been increasingly weak and had trouble with ALDs.  He was admitted for the management of COPD exacerbation/hypoxic respiratory failure.  Palliative care also consulted  due to his persistent respiratory failure for possible goals of care.  Respiratory status is improving but very slow and hospital course remarkable for persistent wheezing.PCCM was also following.  He is medically stable for discharge to skilled nursing facility today.  Due to advanced age, end-stage COPD and multiple comorbidities, we recommend to follow-up as an outpatient with palliative care.  Following problems were addressed during his hospitalization:  Acute hypoxic respiratory failure/COPD exacerbation: Presented with rhonchi, wheezing, respiratory distress.  Started on IV steroids, Z-Pak, bronchodilators.  Currently on 2 L oxygen via nasal cannula.  He was on BiPAP till 07/16/2019. Chest x-ray on admission showed chronic emphysematous changes without pneumonia.Last follow-up chest x-ray showed increased interstitial densities bilaterally which may reflect mild edema or atypical infection superimposed on chronic lung disease. Palliative care was also following regarding his poor prognosis, advanced age. Respiratory  status continues to improve but very slow and he had  significant wheezes.  PCCM consulted who recommended to continue current care. Last CXR on 07/20/2019 showed increasing interstitial opacities throughout the right lung, unchanged on the left, concerning for worsening asymmetric edema or atypical infection.  He has completed the course of azithromycin. Echocardiogram showed ejection fraction of 70 to 75%, indeterminate diastolic parameters. Started on lasix 40 mg daily. He will be continued on Pulmicort and prednisone 10 mg daily on discharge after finishing 5 days course of 40 mg of prednisone.  His COPD looks advanced.  Heart block: EKG showed Wenckebach phenomena.  Cardiology was consulted.Cardiology recommended to avoid AV nodal blockers, no indication for pacemaker.  Hyponatremia:Stable  Hyperkalemia: Resolved with lokelma  Coronary artery disease/peripheral artery disease: Continue aspirin, Crestor  Hypertension: Blood pressure stable.  Losartan on hold.  Type 2 diabetes mellitus: Continue home regimen  Goals of care: Elderly with advanced COPD.  Palliative care consulted for goals of care.  DNR.we recommended outpatient follow with palliative care .  Debility/deconditioning:He will need skilled nursing facility on discharge.PT/OT evaluation done  Twitching of extremities: Unknown etiology. CT head was negative for any acute intracranial abnormalities.  Patient is alert and oriented.  Right arm laceration: Managed  in the emergency department.  Continue pain management/wound care  Peripheral artery disease: Continue aspirin, statin   Discharge Diagnoses:  Principal Problem:   Acute hypoxemic respiratory failure (HCC) Active Problems:   Coronary heart disease   Essential hypertension   Hyperlipidemia   Hyponatremia   Type 2 diabetes mellitus without complication, with long-term current use of insulin (HCC)   COPD with acute exacerbation (HCC)   Elevated  troponin   COPD exacerbation [facility])   Palliative care encounter    Discharge Instructions  Discharge Instructions    Diet - low sodium heart healthy  Complete by: As directed    Discharge instructions   Complete by: As directed    1) Take prescribed medications as instructed 2) Follow up with your pulmonologist Dr. Lamonte Sakai in 1 week. 3) Do a CBC and Bmp tests in a week. 4)Follow up with palliative care as an outpatient.   Discharge wound care:   Complete by: As directed    As per wound nurse   Increase activity slowly   Complete by: As directed      Allergies as of 07/22/2019      Reactions   Actos [pioglitazone Hydrochloride] Other (See Comments)   RIGHT LEG SWELLING   Metformin And Related Other (See Comments)   Reaction unknown      Medication List    STOP taking these medications   losartan 50 MG tablet Commonly known as: COZAAR   olmesartan 40 MG tablet Commonly known as: BENICAR     TAKE these medications   Acetaminophen 500 MG capsule Take 1,000 mg by mouth every 4 (four) hours as needed for pain.   allopurinol 100 MG tablet Commonly known as: ZYLOPRIM Take 100 mg by mouth daily.   Anoro Ellipta 62.5-25 MCG/INH Aepb Generic drug: umeclidinium-vilanterol USE 1 INHALATION ORALLY ONE TIME DAILY What changed: See the new instructions.   aspirin 81 MG tablet Take 81 mg by mouth daily.   BD Pen Needle Nano U/F 32G X 4 MM Misc Generic drug: Insulin Pen Needle   budesonide 0.25 MG/2ML nebulizer solution Commonly known as: PULMICORT Take 2 mLs (0.25 mg total) by nebulization 2 (two) times daily.   cetirizine 10 MG tablet Commonly known as: ZYRTEC Take 10 mg by mouth daily as needed for allergies.   DULoxetine 30 MG capsule Commonly known as: CYMBALTA Take 30 mg by mouth daily.   furosemide 40 MG tablet Commonly known as: LASIX Take 1 tablet (40 mg total) by mouth daily. What changed:   medication strength  how much to take   HumaLOG KwikPen  100 UNIT/ML KwikPen Generic drug: insulin lispro Inject 8 Units into the skin daily with lunch.   ipratropium-albuterol 0.5-2.5 (3) MG/3ML Soln Commonly known as: DUONEB Take 3 mLs by nebulization every 6 (six) hours as needed.   Iron 325 (65 Fe) MG Tabs Take 325 mg by mouth daily.   levothyroxine 75 MCG tablet Commonly known as: SYNTHROID Take 75 mcg by mouth daily before breakfast.   omeprazole 20 MG capsule Commonly known as: PRILOSEC Take 20 mg by mouth 2 (two) times daily.   oxyCODONE 5 MG immediate release tablet Commonly known as: Oxy IR/ROXICODONE Take 1 tablet (5 mg total) by mouth every 4 (four) hours as needed for moderate pain.   polyethylene glycol 17 g packet Commonly known as: MIRALAX / GLYCOLAX Take 17 g by mouth daily as needed for moderate constipation.   predniSONE 20 MG tablet Commonly known as: DELTASONE Take 2 tablets (40 mg total) by mouth daily with breakfast for 4 days. Start taking on: July 23, 2019 What changed:   medication strength  how much to take  how to take this  when to take this  additional instructions   predniSONE 10 MG tablet Commonly known as: DELTASONE Take 1 tablet (10 mg total) by mouth daily with breakfast. Continue indefinately Start taking on: July 27, 2019 What changed: You were already taking a medication with the same name, and this prescription was added. Make sure you understand how and when to take each.   ProAir  HFA 108 (90 Base) MCG/ACT inhaler Generic drug: albuterol Inhale 2 puffs into the lungs every 6 (six) hours as needed for wheezing or shortness of breath.   rosuvastatin 40 MG tablet Commonly known as: CRESTOR Take 40 mg by mouth daily.   Stool Softener 100 MG capsule Generic drug: docusate sodium Take 200 mg by mouth 2 (two) times daily.   Tyler Aas FlexTouch 100 UNIT/ML FlexTouch Pen Generic drug: insulin degludec Inject 26 Units into the skin in the morning.            Discharge Care  Instructions  (From admission, onward)         Start     Ordered   07/22/19 0000  Discharge wound care:       Comments: As per wound nurse   07/22/19 1116          Contact information for follow-up providers    Collene Gobble, MD. Schedule an appointment as soon as possible for a visit in 1 week(s).   Specialty: Pulmonary Disease Contact information: [address] [place] [address] [phone]            Contact information for after-discharge care    Destination    [facility] .   Service: Skilled Nursing Contact information: [place] [address] [phone]                 Allergies  Allergen Reactions  . Actos [Pioglitazone Hydrochloride] Other (See Comments)    RIGHT LEG SWELLING   . Metformin And Related Other (See Comments)    Reaction unknown    Consultations:  PCCM   Procedures/Studies: DG Chest 1 View  Result Date: 07/20/2019 CLINICAL DATA:  Pt has SOB with acute hypoxemic respiratory failure EXAM: CHEST  1 VIEW COMPARISON:  Chest radiograph 07/17/2019 FINDINGS: Stable enlarged cardiomediastinal contours. Aortic arch calcification. There are increasing interstitial opacities throughout the right lung. Persistent basilar predominant infiltrates in the left lung. No pneumothorax or large pleural effusion. IMPRESSION: Increasing interstitial opacities throughout the right lung, unchanged on the left, concerning for worsening asymmetric edema or atypical infection. Electronically Signed   By: Audie Pinto M.D.   On: 07/20/2019 16:01   DG Chest 1 View  Result Date: 07/17/2019 CLINICAL DATA:  Shortness of breath.  Wheezing. EXAM: CHEST  1 VIEW COMPARISON:  Radiograph 07/15/2019 FINDINGS: Patient is rotated. Stable heart size and mediastinal contours allowing for rotation. Aortic atherosclerosis. Suspected small pleural effusions, increased from prior imaging. Interstitial  prominence is increased, favor pulmonary edema over bronchitic. No pneumothorax. IMPRESSION: 1. Increasing small pleural effusions over the past few days. 2. Increasing interstitial prominence, favor pulmonary edema over bronchitic markings. Electronically Signed   By: Keith Rake M.D.   On: 07/17/2019 16:49   CT Head Wo Contrast  Result Date: 07/14/2019 CLINICAL DATA:  Altered mental status. EXAM: CT HEAD WITHOUT CONTRAST TECHNIQUE: Contiguous axial images were obtained from the base of the skull through the vertex without intravenous contrast. COMPARISON:  07/04/2016 FINDINGS: Brain: Stable age related cerebral atrophy, ventriculomegaly and periventricular white matter disease. No extra-axial fluid collections are identified. No CT findings for acute hemispheric infarction or intracranial hemorrhage. No mass lesions. The brainstem and cerebellum are normal. Vascular: Advanced vascular calcifications but no aneurysm or hyperdense vessels. Skull: No skull fracture or bone lesions. Sinuses/Orbits: The paranasal sinuses and mastoid air cells are clear. The globes are intact. Other: No scalp lesions or hematoma. IMPRESSION:  1. Stable age related cerebral atrophy, ventriculomegaly and periventricular white matter disease. 2. No acute intracranial findings or mass lesions. Electronically Signed   By: Marijo Sanes M.D.   On: 07/14/2019 06:56   DG Chest Port 1 View  Result Date: 07/15/2019 CLINICAL DATA:  Shortness of breath. EXAM: PORTABLE CHEST 1 VIEW COMPARISON:  07/13/2019 FINDINGS: The cardiomediastinal silhouette is unchanged with aortic tortuosity and atherosclerosis again noted. There is persistent pulmonary vascular congestion, and the interstitial markings are further increased diffusely compared to the prior study. No sizable pleural effusion or pneumothorax is identified. IMPRESSION: Increased interstitial densities bilaterally which may reflect mild edema or atypical infection superimposed on  chronic lung disease. Electronically Signed   By: Logan Bores M.D.   On: 07/15/2019 16:07   DG Chest Port 1 View  Result Date: 07/13/2019 CLINICAL DATA:  Hypoxia, COPD EXAM: PORTABLE CHEST 1 VIEW COMPARISON:  07/07/2016 FINDINGS: Single frontal view of the chest demonstrates an enlarged cardiac silhouette. There is ectasia of the thoracic aorta, with mild atherosclerosis of the aortic arch. There is mild chronic central vascular congestion. Increased interstitial prominence may be related to COPD. No focal airspace disease, effusion, or pneumothorax. IMPRESSION: 1. Chronic central vascular congestion. 2. Interstitial prominence likely related emphysema. No acute airspace disease. Electronically Signed   By: Randa Ngo M.D.   On: 07/13/2019 21:16   ECHOCARDIOGRAM COMPLETE  Result Date: 07/20/2019    ECHOCARDIOGRAM REPORT   Patient Name:   Mclain Freer. Date of Exam: 07/20/2019 Medical Rec #:  [id number]           Height:       68.0 in Accession #:    [id number]          Weight:       228.0 lb Date of Birth:  1931-05-13           BSA:          2.161 m Patient Age:    76 years            BP:           127/67 mmHg Patient Gender: M                   HR:           88 bpm. Exam Location:  Inpatient Procedure: 2D Echo Indications:    dyspnea 786.09  History:        Patient has no prior history of Echocardiogram examinations.                 COPD, Signs/Symptoms:elevated troponin; Risk                 Factors:Hypertension, Dyslipidemia and Diabetes.  Sonographer:    Johny Chess Referring Phys: [id number] Reeanna Acri  Sonographer Comments: Image acquisition challenging due to COPD, Image acquisition challenging due to respiratory motion and very hard of hearing. IMPRESSIONS  1. Left ventricular ejection fraction, by estimation, is 70 to 75%. The left ventricle has hyperdynamic function. The left ventricle has no regional wall motion abnormalities. There is mild left ventricular hypertrophy. Left  ventricular diastolic parameters are indeterminate.  2. Right ventricular systolic function is normal. The right ventricular size is normal.  3. Left atrial size was mildly dilated.  4. The mitral valve is abnormal. No evidence of mitral valve regurgitation.  5. The aortic valve is abnormal. Aortic valve regurgitation is trivial. Mild to moderate aortic valve sclerosis/calcification is present,  without any evidence of aortic stenosis.  6. The inferior vena cava is normal in size with greater than 50% respiratory variability, suggesting right atrial pressure of 3 mmHg. FINDINGS  Left Ventricle: Left ventricular ejection fraction, by estimation, is 70 to 75%. The left ventricle has hyperdynamic function. The left ventricle has no regional wall motion abnormalities. The left ventricular internal cavity size was normal in size. There is mild left ventricular hypertrophy. Left ventricular diastolic parameters are indeterminate. Right Ventricle: The right ventricular size is normal. No increase in right ventricular wall thickness. Right ventricular systolic function is normal. Left Atrium: Left atrial size was mildly dilated. Right Atrium: Right atrial size was normal in size. Pericardium: There is no evidence of pericardial effusion. Mitral Valve: The mitral valve is abnormal. There is mild thickening of the mitral valve leaflet(s). Mild mitral annular calcification. No evidence of mitral valve regurgitation. MV peak gradient, 27.4 mmHg. The mean mitral valve gradient is 13.0 mmHg. Tricuspid Valve: The tricuspid valve is normal in structure. Tricuspid valve regurgitation is trivial. Aortic Valve: The aortic valve is abnormal. Aortic valve regurgitation is trivial. Mild to moderate aortic valve sclerosis/calcification is present, without any evidence of aortic stenosis. Pulmonic Valve: The pulmonic valve was normal in structure. Pulmonic valve regurgitation is trivial. Aorta: The aortic root is normal in size and  structure. Venous: The inferior vena cava is normal in size with greater than 50% respiratory variability, suggesting right atrial pressure of 3 mmHg. IAS/Shunts: No atrial level shunt detected by color flow Doppler.  LEFT VENTRICLE PLAX 2D LVIDd:         3.30 cm  Diastology LVIDs:         1.80 cm  LV e' lateral: 7.29 cm/s LVOT diam:     1.90 cm  LV e' medial:  6.74 cm/s LV SV:         75 LV SV Index:   35 LVOT Area:     2.84 cm  RIGHT VENTRICLE             IVC RV S prime:     15.20 cm/s  IVC diam: 1.90 cm TAPSE (M-mode): 2.1 cm LEFT ATRIUM             Index       RIGHT ATRIUM           Index LA diam:        3.40 cm 1.57 cm/m  RA Area:     10.30 cm LA Vol (A2C):   51.5 ml 23.84 ml/m RA Volume:   20.00 ml  9.26 ml/m LA Vol (A4C):   78.8 ml 36.47 ml/m LA Biplane Vol: 68.3 ml 31.61 ml/m  AORTIC VALVE LVOT Vmax:   141.00 cm/s LVOT Vmean:  89.700 cm/s LVOT VTI:    0.265 m  AORTA Ao Root diam: 3.20 cm Ao Asc diam:  2.90 cm MITRAL VALVE MV Peak grad: 27.4 mmHg  SHUNTS MV Mean grad: 13.0 mmHg  Systemic VTI:  0.26 m MV Vmax:      2.62 m/s   Systemic Diam: 1.90 cm MV Vmean:     166.5 cm/s Dorris Carnes MD Electronically signed by Dorris Carnes MD Signature Date/Time: 07/20/2019/6:01:23 PM    Final        Subjective:  Patient seen and examined at the bedside this morning.  Medically stable for discharge. Discharge Exam: Vitals:   07/22/19 0752 07/22/19 1107  BP:    Pulse:    Resp:  Temp:    SpO2: 94% (!) 88%   Vitals:   07/22/19 0512 07/22/19 0720 07/22/19 0752 07/22/19 1107  BP: (!) 110/55 134/66    Pulse: 79 82    Resp: 16 20    Temp: 98.1 F (36.7 C) 98.2 F (36.8 C)    TempSrc: Oral Oral    SpO2: 95% 94% 94% (!) 88%  Weight: 103 kg     Height:        General: Pt is alert, awake, not in acute distress Cardiovascular: RRR, S1/S2 +, no rubs, no gallops Respiratory: Bilateral decreased entry, faint wheezes Abdominal: Soft, NT, ND, bowel sounds + Extremities: no edema, no  cyanosis    The results of significant diagnostics from this hospitalization (including imaging, microbiology, ancillary and laboratory) are listed below for reference.     Microbiology: Recent Results (from the past 240 hour(s))  Culture, blood (single) w Reflex to ID Panel     Status: None   Collection Time: 07/13/19  9:00 PM   Specimen: BLOOD LEFT FOREARM  Result Value Ref Range Status   Specimen Description   Final    BLOOD LEFT FOREARM Performed at [facility], [address] [place], [place] [address]    Special Requests   Final    BOTTLES DRAWN AEROBIC AND ANAEROBIC Blood Culture adequate volume Performed at [facility], [address] [place], [place] [address]    Culture   Final    NO GROWTH 5 DAYS Performed at [facility], [place] [address], [place], [place] [address]    Report Status 07/19/2019 FINAL  Final  SARS Coronavirus 2 by RT PCR (hospital order, performed in [facility] hospital lab) Nasopharyngeal Nasopharyngeal Swab     Status: None   Collection Time: 07/13/19  9:47 PM   Specimen: Nasopharyngeal Swab  Result Value Ref Range Status   SARS Coronavirus 2 NEGATIVE NEGATIVE Final    Comment: (NOTE) SARS-CoV-2 target nucleic acids are NOT DETECTED.  The SARS-CoV-2 RNA is generally detectable in upper and lower respiratory specimens during the acute phase of infection. The lowest concentration of SARS-CoV-2 viral copies this assay can detect is 250 copies / mL. A negative result does not preclude SARS-CoV-2 infection and should not be used as the sole basis for treatment or other patient management decisions.  A negative result may occur with improper specimen collection / handling, submission of specimen other than nasopharyngeal swab, presence of viral mutation(s) within the areas targeted by this assay, and inadequate number of viral copies (<250 copies / mL). A negative result must be combined with clinical observations,  patient history, and epidemiological information.  Fact Sheet for Patients:   StrictlyIdeas.no  Fact Sheet for Healthcare Providers: BankingDealers.co.za  This test is not yet approved or  cleared by the [place] FDA and has been authorized for detection and/or diagnosis of SARS-CoV-2 by FDA under an Emergency Use Authorization (EUA).  This EUA will remain in effect (meaning this test can be used) for the duration of the COVID-19 declaration under Section 564(b)(1) of the Act, 21 U.S.C. section 360bbb-3(b)(1), unless the authorization is terminated or revoked sooner.  Performed at [facility], [address] [place], [place] [address]      Labs: BNP (last 3 results) No results for input(s): BNP in the last 8760 hours. Basic Metabolic Panel: Recent Labs  Lab 07/16/19 0451 07/16/19 0451 07/17/19 9379 07/17/19 1753 07/18/19 0257 07/21/19 0240  07/22/19 0501  NA 128*  --  132*  --  133* 129* 132*  K 5.2*   < > 5.6* 5.2* 4.3 4.7 4.2  CL 93*  --  93*  --  89* 89* 89*  CO2 29  --  32  --  36* 32 33*  GLUCOSE 164*  --  166*  --  163* 183* 166*  BUN 23  --  24*  --  23 21 28*  CREATININE 0.86  --  0.94  --  0.95 0.79 1.00  CALCIUM 8.5*  --  8.8*  --  8.7* 8.5* 8.7*   < > = values in this interval not displayed.   Liver Function Tests: No results for input(s): AST, ALT, ALKPHOS, BILITOT, PROT, ALBUMIN in the last 168 hours. No results for input(s): LIPASE, AMYLASE in the last 168 hours. No results for input(s): AMMONIA in the last 168 hours. CBC: Recent Labs  Lab 07/17/19 0759 07/18/19 0257  WBC 11.5* 10.1  NEUTROABS 10.4* 8.3*  HGB 11.7* 11.4*  HCT 39.2 36.5*  MCV 94.0 92.6  PLT 258 235   Cardiac Enzymes: No results for input(s): CKTOTAL, CKMB, CKMBINDEX, TROPONINI in the last 168 hours. BNP: Invalid input(s): POCBNP CBG: Recent Labs  Lab 07/21/19 0744 07/21/19 1142 07/21/19 1657 07/21/19 2131  07/22/19 0719  GLUCAP 181* 160* 193* 187* 139*   D-Dimer No results for input(s): DDIMER in the last 72 hours. Hgb A1c No results for input(s): HGBA1C in the last 72 hours. Lipid Profile No results for input(s): CHOL, HDL, LDLCALC, TRIG, CHOLHDL, LDLDIRECT in the last 72 hours. Thyroid function studies No results for input(s): TSH, T4TOTAL, T3FREE, THYROIDAB in the last 72 hours.  Invalid input(s): FREET3 Anemia work up No results for input(s): VITAMINB12, FOLATE, FERRITIN, TIBC, IRON, RETICCTPCT in the last 72 hours. Urinalysis    Component Value Date/Time   COLORURINE YELLOW 07/13/2019 2200   APPEARANCEUR CLEAR 07/13/2019 2200   LABSPEC 1.020 07/13/2019 2200   PHURINE 6.0 07/13/2019 2200   GLUCOSEU NEGATIVE 07/13/2019 2200   HGBUR SMALL (A) 07/13/2019 2200   BILIRUBINUR NEGATIVE 07/13/2019 2200   KETONESUR NEGATIVE 07/13/2019 2200   PROTEINUR NEGATIVE 07/13/2019 2200   NITRITE NEGATIVE 07/13/2019 2200   LEUKOCYTESUR NEGATIVE 07/13/2019 2200   Sepsis Labs Invalid input(s): PROCALCITONIN,  WBC,  LACTICIDVEN Microbiology Recent Results (from the past 240 hour(s))  Culture, blood (single) w Reflex to ID Panel     Status: None   Collection Time: 07/13/19  9:00 PM   Specimen: BLOOD LEFT FOREARM  Result Value Ref Range Status   Specimen Description   Final    BLOOD LEFT FOREARM Performed at [facility], [address] [place], [place] [address]    Special Requests   Final    BOTTLES DRAWN AEROBIC AND ANAEROBIC Blood Culture adequate volume Performed at [facility], [address] [place], [place] [address]    Culture   Final    NO GROWTH 5 DAYS Performed at [facility], [place] [address], [place], [place] [address]    Report Status 07/19/2019 FINAL  Final  SARS Coronavirus 2 by RT PCR (hospital order, performed in [facility] hospital lab) Nasopharyngeal Nasopharyngeal Swab     Status: None   Collection Time: 07/13/19  9:47 PM   Specimen:  Nasopharyngeal Swab  Result Value Ref Range Status   SARS Coronavirus 2 NEGATIVE NEGATIVE Final    Comment: (NOTE) SARS-CoV-2 target nucleic acids  are NOT DETECTED.  The SARS-CoV-2 RNA is generally detectable in upper and lower respiratory specimens during the acute phase of infection. The lowest concentration of SARS-CoV-2 viral copies this assay can detect is 250 copies / mL. A negative result does not preclude SARS-CoV-2 infection and should not be used as the sole basis for treatment or other patient management decisions.  A negative result may occur with improper specimen collection / handling, submission of specimen other than nasopharyngeal swab, presence of viral mutation(s) within the areas targeted by this assay, and inadequate number of viral copies (<250 copies / mL). A negative result must be combined with clinical observations, patient history, and epidemiological information.  Fact Sheet for Patients:   StrictlyIdeas.no  Fact Sheet for Healthcare Providers: BankingDealers.co.za  This test is not yet approved or  cleared by the [place] FDA and has been authorized for detection and/or diagnosis of SARS-CoV-2 by FDA under an Emergency Use Authorization (EUA).  This EUA will remain in effect (meaning this test can be used) for the duration of the COVID-19 declaration under Section 564(b)(1) of the Act, 21 U.S.C. section 360bbb-3(b)(1), unless the authorization is terminated or revoked sooner.  Performed at [facility], [address], [place], [place] [address]     Please note: You were cared for by a hospitalist during your hospital stay. Once you are discharged, your primary care physician will handle any further medical issues. Please note that NO REFILLS for any discharge medications will be authorized once you are discharged, as it is imperative that you return to your primary care physician (or  establish a relationship with a primary care physician if you do not have one) for your post hospital discharge needs so that they can reassess your need for medications and monitor your lab values.    Time coordinating discharge: 40 minutes  SIGNED:   Shelly Coss, MD  [facility] 07/22/2019, 11:17 AM Pager [phone]  If 7PM-7AM, please contact night-coverage [email or web address] Password [email or web address]

## 2019-07-22 NOTE — Discharge Instructions (Signed)
Chronic Obstructive Pulmonary Disease Chronic obstructive pulmonary disease (COPD) is a long-term (chronic) lung problem. When you have COPD, it is hard for air to get in and out of your lungs. Usually the condition gets worse over time, and your lungs will never return to normal. There are things you can do to keep yourself as healthy as possible.  Your doctor may treat your condition with: ? Medicines. ? Oxygen. ? Lung surgery.  Your doctor may also recommend: ? Rehabilitation. This includes steps to make your body work better. It may involve a team of specialists. ? Quitting smoking, if you smoke. ? Exercise and changes to your diet. ? Comfort measures (palliative care). Follow these instructions at home: Medicines  Take over-the-counter and prescription medicines only as told by your doctor.  Talk to your doctor before taking any cough or allergy medicines. You may need to avoid medicines that cause your lungs to be dry. Lifestyle  If you smoke, stop. Smoking makes the problem worse. If you need help quitting, ask your doctor.  Avoid being around things that make your breathing worse. This may include smoke, chemicals, and fumes.  Stay active, but remember to rest as well.  Learn and use tips on how to relax.  Make sure you get enough sleep. Most adults need at least 7 hours of sleep every night.  Eat healthy foods. Eat smaller meals more often. Rest before meals. Controlled breathing Learn and use tips on how to control your breathing as told by your doctor. Try:  Breathing in (inhaling) through your nose for 1 second. Then, pucker your lips and breath out (exhale) through your lips for 2 seconds.  Putting one hand on your belly (abdomen). Breathe in slowly through your nose for 1 second. Your hand on your belly should move out. Pucker your lips and breathe out slowly through your lips. Your hand on your belly should move in as you breathe out.  Controlled coughing Learn  and use controlled coughing to clear mucus from your lungs. Follow these steps: 1. Lean your head a little forward. 2. Breathe in deeply. 3. Try to hold your breath for 3 seconds. 4. Keep your mouth slightly open while coughing 2 times. 5. Spit any mucus out into a tissue. 6. Rest and do the steps again 1 or 2 times as needed. General instructions  Make sure you get all the shots (vaccines) that your doctor recommends. Ask your doctor about a flu shot and a pneumonia shot.  Use oxygen therapy and pulmonary rehabilitation if told by your doctor. If you need home oxygen therapy, ask your doctor if you should buy a tool to measure your oxygen level (oximeter).  Make a COPD action plan with your doctor. This helps you to know what to do if you feel worse than usual.  Manage any other conditions you have as told by your doctor.  Avoid going outside when it is very hot, cold, or humid.  Avoid people who have a sickness you can catch (contagious).  Keep all follow-up visits as told by your doctor. This is important. Contact a doctor if:  You cough up more mucus than usual.  There is a change in the color or thickness of the mucus.  It is harder to breathe than usual.  Your breathing is faster than usual.  You have trouble sleeping.  You need to use your medicines more often than usual.  You have trouble doing your normal activities such as getting dressed   or walking around the house. Get help right away if:  You have shortness of breath while resting.  You have shortness of breath that stops you from: ? Being able to talk. ? Doing normal activities.  Your chest hurts for longer than 5 minutes.  Your skin color is more blue than usual.  Your pulse oximeter shows that you have low oxygen for longer than 5 minutes.  You have a fever.  You feel too tired to breathe normally. Summary  Chronic obstructive pulmonary disease (COPD) is a long-term lung problem.  The way your  lungs work will never return to normal. Usually the condition gets worse over time. There are things you can do to keep yourself as healthy as possible.  Take over-the-counter and prescription medicines only as told by your doctor.  If you smoke, stop. Smoking makes the problem worse. This information is not intended to replace advice given to you by your health care provider. Make sure you discuss any questions you have with your health care provider. Document Revised: 12/01/2016 Document Reviewed: 01/24/2016 Elsevier Patient Education  2020 Elsevier Inc.  

## 2019-07-22 NOTE — TOC Transition Note (Addendum)
Transition of Care [facility]) - CM/SW Discharge Note   Patient Details  Name: Pietro Bonura. MRN: [id number] Date of Birth: 1931-01-19  [facility]) CM/SW Contact:  Trula Ore, [place] Phone Number: 07/22/2019, 2:07 PM   Clinical Narrative:     Patient will DC to: [place] date: 07/22/2019  Family notified: Jenny Reichmann  Transport by: Corey Harold  ?  Per MD patient ready for DC to [facility] with palliative services to follow . RN, patient, patient's family, [facility] facility notified of DC. Discharge Summary sent to facility. RN given number for report [phone] VE#[id number]. DC packet on chart. Ambulance transport requested for patient.  CSW signing off.   Final next level of care: Skilled Nursing Facility Barriers to Discharge: No Barriers Identified   Patient Goals and CMS Choice Patient states their goals for this hospitalization and ongoing recovery are:: to go to SNF CMS Medicare.gov Compare Post Acute Care list provided to:: Patient Choice offered to / list presented to : Patient  Discharge Placement              Patient chooses bed at: [facility] Patient to be transferred to facility by: [place] Name of family member notified: [place] Patient and family notified of of transfer: 07/22/19  Discharge Plan and Services                                     Social Determinants of Health (SDOH) Interventions     Readmission Risk Interventions No flowsheet data found.

## 2019-07-22 NOTE — Consult Note (Signed)
   [facility] CM Inpatient Consult   07/22/2019  Darrill Vreeland [birth year] [id number]    [facility] [ACO] Patient:  Medicare NextGen  Medical record reviewed for length of stay and high risk score for unplanned readmission.  Admitted with COPD exacerbation.  Patient is being recommended for a skilled nursing facility Patient will be followed by [place] [facility] PAC with Medicare NextGen ACO, if he transitions to a [facility] affiliated facility for SNF rehab level of care. Patient medical record encounters reveals he has been vaccinated for COVID-19.  Primary Care Provider: Hulan Fess, MD  Plan: To notify the [facility] RN PAC of disposition and transitional needs.  For questions, please contact:  Natividad Brood, RN BSN [facility] Liaison  [phone] business mobile phone Toll free office [phone]  Fax number: [phone] [email or web address]

## 2019-07-22 NOTE — Progress Notes (Signed)
Physical Therapy Treatment Patient Details Name: Ernest Clark. MRN: [id number] DOB: 1931-09-10 Today's Date: 07/22/2019    History of Present Illness 86 y.o. male with medical history significant of COPD-not on home oxygen, hypertension, hyperlipidemia, coronary artery disease, diabetes, peripheral arterial disease, chronic hyponatremia, former tobacco/alcohol abuse presents to emergency department due to generalized weakness and slid out of the bed 07/14/19 and incurred an approximately 3 inch skin tear to right forearm-which was repaired at [facility]. Pt transferred to [facility] ED for evaluation of bradycardia. Admitted 07/14/19 for COPD exacerbation     PT Comments    Pt much more alert today and agreeable to work with therapy. Pt continues to have some nonsensical conversation but the majority is appropriate. Pt is min A for bed mobility, modA for sit>stand from bed, and progressed to min guard for sit>stand from [place]. Pt also able to static stand>30 sec in [place] without outside assist. Pt very pleased with his progress. Pt is hopeful for d/c to SNF this afternoon.    Follow Up Recommendations  SNF     Equipment Recommendations  Other (comment) (TBD at next venue)       Precautions / Restrictions Precautions Precautions: Fall Precaution Comments: fall prior to admission  Restrictions Weight Bearing Restrictions: No    Mobility  Bed Mobility Overal bed mobility: Needs Assistance Bed Mobility: Supine to Sit     Supine to sit: Min assist     General bed mobility comments: min A for trunk to upright, pt able to move LE off bed, and scoot hips to EoB   Transfers Overall transfer level: Needs assistance   Transfers: Sit to/from Stand Sit to Stand: From elevated surface;Min assist;Mod assist;Supervision;+2 physical assistance         General transfer comment: initial stand to [facility] from bed surface pt requires modAx2 to come to fully upright. once move to  in front of the recliner in [place], worked on sit>stand from [employer]. Pt requires min Ax2 to start and by 5th bout able to come to standing with min guard  Ambulation/Gait             General Gait Details: unable          Balance Overall balance assessment: Needs assistance Sitting-balance support: Feet supported;Bilateral upper extremity supported Sitting balance-Leahy Scale: Poor     Standing balance support: Bilateral upper extremity supported Standing balance-Leahy Scale: Poor                              Cognition Arousal/Alertness: Awake/alert Behavior During Therapy: WFL for tasks assessed/performed Overall Cognitive Status: Difficult to assess Area of Impairment: Safety/judgement;Awareness;Following commands;Problem solving                       Following Commands: Follows multi-step commands with increased time;Follows one step commands with increased time (difficulty due to [facility]) Safety/Judgement: Decreased awareness of safety Awareness: Emergent Problem Solving: Slow processing;Requires verbal cues;Requires tactile cues General Comments: requires increased cuing due to difficulty hearing, but requires extra time for initation          General Comments General comments (skin integrity, edema, etc.): Pt on 2L O2 with SaO2 >87%O2 with sit>stand, HR max with exercise 132bpm      Pertinent Vitals/Pain Pain Assessment: Faces Faces Pain Scale: Hurts a little bit Pain Location: R forearm in location of stiches Pain Descriptors /  Indicators: Grimacing;Guarding Pain Intervention(s): Limited activity within patient's tolerance;Monitored during session;Repositioned           PT Goals (current goals can now be found in the care plan section) Acute Rehab PT Goals Patient Stated Goal: pt continues to worry about his lost hearing aide requesting all of the bed linens be shaken out to see if it is there PT Goal Formulation: With  patient/family Time For Goal Achievement: 07/30/19 Potential to Achieve Goals: Good Progress towards PT goals: Progressing toward goals    Frequency    Min 2X/week      PT Plan Current plan remains appropriate       AM-PAC PT "6 Clicks" Mobility   Outcome Measure  Help needed turning from your back to your side while in a flat bed without using bedrails?: A Lot Help needed moving from lying on your back to sitting on the side of a flat bed without using bedrails?: Total Help needed moving to and from a bed to a chair (including a wheelchair)?: Total Help needed standing up from a chair using your arms (e.g., wheelchair or bedside chair)?: Total Help needed to walk in hospital room?: Total Help needed climbing 3-5 steps with a railing? : Total 6 Click Score: 7    End of Session Equipment Utilized During Treatment: Gait belt;Oxygen Activity Tolerance: Patient limited by fatigue Patient left: in chair;with call bell/phone within reach;with chair alarm set Nurse Communication: Mobility status PT Visit Diagnosis: Unsteadiness on feet (R26.81);Other abnormalities of gait and mobility (R26.89);Muscle weakness (generalized) (M62.81);Difficulty in walking, not elsewhere classified (R26.2);History of falling (Z91.81)     Time: 1027-2536 PT Time Calculation (min) (ACUTE ONLY): 27 min  Charges:  $Therapeutic Exercise: 8-22 mins $Therapeutic Activity: 8-22 mins                     Elizabeth B. Migdalia Dk PT, DPT Acute Rehabilitation Services Pager [phone] Office [phone]    [place] 07/22/2019, 1:46 PM

## 2019-07-22 NOTE — Progress Notes (Signed)
Occupational Therapy Treatment Patient Details Name: Ernest Clark. MRN: [id number] DOB: [birth year] Today's Date: 07/22/2019    History of present illness 86 y.o. male with medical history significant of COPD-not on home oxygen, hypertension, hyperlipidemia, coronary artery disease, diabetes, peripheral arterial disease, chronic hyponatremia, former tobacco/alcohol abuse presents to emergency department due to generalized weakness and slid out of the bed 07/14/19 and incurred an approximately 3 inch skin tear to right forearm-which was repaired at [facility]. Pt transferred to [facility] ED for evaluation of bradycardia. Admitted 07/14/19 for COPD exacerbation    OT comments  Patient continues to make steady progress towards goals in skilled OT session. Patient's session encompassed theraband education to promote increased endurance and activity tolerance. Pt requires hand over hand cues in order to complete due to significant HOH. Education provided to son with verbal understanding noted. Pt repositioned in recliner at end of session to promote upright posture and was able to scoot hips back in the chair with min A. Discharge remains appropriate at this time; will continue to follow acutely.    Follow Up Recommendations  SNF;Supervision/Assistance - 24 hour    Equipment Recommendations  Other (comment) (Defer to next facility)    Recommendations for Other Services      Precautions / Restrictions Precautions Precautions: Fall Precaution Comments: fall prior to admission  Restrictions Weight Bearing Restrictions: No       Mobility Bed Mobility Overal bed mobility: Needs Assistance Bed Mobility: Supine to Sit     Supine to sit: Min assist     General bed mobility comments: Up in recliner upon arrival  Transfers Overall transfer level: Needs assistance   Transfers: Sit to/from Stand Sit to Stand: From elevated surface;Min assist;Mod assist;Supervision;+2 physical  assistance         General transfer comment: initial stand to [facility] from bed surface pt requires modAx2 to come to fully upright. once move to in front of the recliner in [place], worked on sit>stand from [employer]. Pt requires min Ax2 to start and by 5th bout able to come to standing with min guard    Balance Overall balance assessment: Needs assistance Sitting-balance support: Feet supported;Bilateral upper extremity supported Sitting balance-Leahy Scale: Poor     Standing balance support: Bilateral upper extremity supported Standing balance-Leahy Scale: Poor                             ADL either performed or assessed with clinical judgement   ADL                                         General ADL Comments: Session focus on theraband strengthening exercises as pt had gotten up with PT not soon before     Vision       Perception     Praxis      Cognition Arousal/Alertness: Awake/alert Behavior During Therapy: WFL for tasks assessed/performed Overall Cognitive Status: Difficult to assess Area of Impairment: Safety/judgement;Awareness;Following commands;Problem solving                       Following Commands: Follows multi-step commands with increased time;Follows one step commands with increased time Safety/Judgement: Decreased awareness of safety Awareness: Emergent Problem Solving: Slow processing;Requires verbal cues;Requires tactile cues General Comments: requires increased cuing due to  difficulty hearing, but requires extra time for initation         Exercises Other Exercises Other Exercises: Theraband, (Level 2 and 3 provided to pt) pt with significant difficulty due to [facility] therefore education provided to son   Shoulder Instructions       General Comments Pt on 2L O2 with SaO2 >87%O2 with sit>stand, HR max with exercise 132bpm    Pertinent Vitals/ Pain       Pain Assessment: Faces Faces Pain Scale: Hurts  little more Pain Location: "My butt is in the crack" (of the recliner) Pain Descriptors / Indicators: Grimacing;Guarding Pain Intervention(s): Limited activity within patient's tolerance;Monitored during session;Repositioned  Home Living                                          Prior Functioning/Environment              Frequency  Min 2X/week        Progress Toward Goals  OT Goals(current goals can now be found in the care plan section)  Progress towards OT goals: Progressing toward goals  Acute Rehab OT Goals Patient Stated Goal: pt continues to worry about his lost hearing aide requesting all of the bed linens be shaken out to see if it is there OT Goal Formulation: With patient/family Time For Goal Achievement: 07/30/19 Potential to Achieve Goals: Good  Plan Discharge plan remains appropriate    Co-evaluation                 AM-PAC OT "6 Clicks" Daily Activity     Outcome Measure   Help from another person eating meals?: A Little Help from another person taking care of personal grooming?: A Little Help from another person toileting, which includes using toliet, bedpan, or urinal?: A Lot Help from another person bathing (including washing, rinsing, drying)?: A Lot Help from another person to put on and taking off regular upper body clothing?: A Little Help from another person to put on and taking off regular lower body clothing?: Total 6 Click Score: 14    End of Session Equipment Utilized During Treatment: Gait belt;Oxygen (Theraband)  OT Visit Diagnosis: Unsteadiness on feet (R26.81);Muscle weakness (generalized) (M62.81)   Activity Tolerance Patient tolerated treatment well;Patient limited by fatigue   Patient Left in chair;with call bell/phone within reach   Nurse Communication Mobility status        Time: 5784-6962 OT Time Calculation (min): 16 min  Charges: OT General Charges $OT Visit: 1 Visit OT  Treatments $Therapeutic Exercise: 8-22 mins  Corinne Ports E. [place], [place] Acute Rehabilitation Services [place] 07/22/2019, 2:22 PM

## 2019-07-23 DIAGNOSIS — J9601 Acute respiratory failure with hypoxia: Secondary | ICD-10-CM | POA: Diagnosis not present

## 2019-07-23 DIAGNOSIS — J441 Chronic obstructive pulmonary disease with (acute) exacerbation: Secondary | ICD-10-CM | POA: Diagnosis not present

## 2019-07-23 DIAGNOSIS — E871 Hypo-osmolality and hyponatremia: Secondary | ICD-10-CM | POA: Diagnosis not present

## 2019-07-23 DIAGNOSIS — E875 Hyperkalemia: Secondary | ICD-10-CM | POA: Diagnosis not present

## 2019-07-24 ENCOUNTER — Telehealth: Payer: Self-pay | Admitting: Emergency Medicine

## 2019-07-24 DIAGNOSIS — J9601 Acute respiratory failure with hypoxia: Secondary | ICD-10-CM | POA: Diagnosis not present

## 2019-07-24 DIAGNOSIS — J441 Chronic obstructive pulmonary disease with (acute) exacerbation: Secondary | ICD-10-CM | POA: Diagnosis not present

## 2019-07-24 DIAGNOSIS — J810 Acute pulmonary edema: Secondary | ICD-10-CM | POA: Diagnosis not present

## 2019-07-24 DIAGNOSIS — J449 Chronic obstructive pulmonary disease, unspecified: Secondary | ICD-10-CM | POA: Diagnosis not present

## 2019-07-24 NOTE — Telephone Encounter (Signed)
Patients son states he has been sent [facility] for rehab. He was place on 2l of o2 in the hospital. Will need a hospital follow up. Patient son will call back and schedule.

## 2019-07-28 ENCOUNTER — Telehealth: Payer: Self-pay | Admitting: Internal Medicine

## 2019-07-28 DIAGNOSIS — F329 Major depressive disorder, single episode, unspecified: Secondary | ICD-10-CM | POA: Diagnosis not present

## 2019-07-28 DIAGNOSIS — H04123 Dry eye syndrome of bilateral lacrimal glands: Secondary | ICD-10-CM | POA: Diagnosis not present

## 2019-07-28 DIAGNOSIS — R4189 Other symptoms and signs involving cognitive functions and awareness: Secondary | ICD-10-CM | POA: Diagnosis not present

## 2019-07-28 DIAGNOSIS — Z515 Encounter for palliative care: Secondary | ICD-10-CM

## 2019-07-28 DIAGNOSIS — F419 Anxiety disorder, unspecified: Secondary | ICD-10-CM | POA: Diagnosis not present

## 2019-07-28 DIAGNOSIS — G4700 Insomnia, unspecified: Secondary | ICD-10-CM | POA: Diagnosis not present

## 2019-07-28 NOTE — Telephone Encounter (Signed)
Phoned listed number for pt's son to discuss Palliative care.  Voice message received.  Contact number for this provider left on voicemail message and asked him to return my call.  Gonzella Lex, NP-C

## 2019-07-29 DIAGNOSIS — J810 Acute pulmonary edema: Secondary | ICD-10-CM | POA: Diagnosis not present

## 2019-07-29 DIAGNOSIS — J441 Chronic obstructive pulmonary disease with (acute) exacerbation: Secondary | ICD-10-CM | POA: Diagnosis not present

## 2019-07-29 DIAGNOSIS — J9601 Acute respiratory failure with hypoxia: Secondary | ICD-10-CM | POA: Diagnosis not present

## 2019-07-29 DIAGNOSIS — E871 Hypo-osmolality and hyponatremia: Secondary | ICD-10-CM | POA: Diagnosis not present

## 2019-07-31 DIAGNOSIS — J441 Chronic obstructive pulmonary disease with (acute) exacerbation: Secondary | ICD-10-CM | POA: Diagnosis not present

## 2019-07-31 DIAGNOSIS — E871 Hypo-osmolality and hyponatremia: Secondary | ICD-10-CM | POA: Diagnosis not present

## 2019-07-31 DIAGNOSIS — J9601 Acute respiratory failure with hypoxia: Secondary | ICD-10-CM | POA: Diagnosis not present

## 2019-07-31 DIAGNOSIS — J810 Acute pulmonary edema: Secondary | ICD-10-CM | POA: Diagnosis not present

## 2019-08-04 ENCOUNTER — Other Ambulatory Visit: Payer: Self-pay | Admitting: *Deleted

## 2019-08-04 DIAGNOSIS — E871 Hypo-osmolality and hyponatremia: Secondary | ICD-10-CM | POA: Diagnosis not present

## 2019-08-04 DIAGNOSIS — I10 Essential (primary) hypertension: Secondary | ICD-10-CM | POA: Diagnosis not present

## 2019-08-04 DIAGNOSIS — J188 Other pneumonia, unspecified organism: Secondary | ICD-10-CM | POA: Diagnosis not present

## 2019-08-04 DIAGNOSIS — J984 Other disorders of lung: Secondary | ICD-10-CM | POA: Diagnosis not present

## 2019-08-04 DIAGNOSIS — E119 Type 2 diabetes mellitus without complications: Secondary | ICD-10-CM | POA: Diagnosis not present

## 2019-08-04 DIAGNOSIS — J810 Acute pulmonary edema: Secondary | ICD-10-CM | POA: Diagnosis not present

## 2019-08-04 DIAGNOSIS — E876 Hypokalemia: Secondary | ICD-10-CM | POA: Diagnosis not present

## 2019-08-04 NOTE — Patient Outreach (Signed)
Member screened for potential [facility] Care Management needs as a benefit of [place] Medicare.  Ernest Clark is receiving skilled therapy at [facility].  Facility SW previously indicated member's transition plan is ALF vs LTC. Member's son is primary contact. Noted [facility] palliative care to follow while in SNF.   Will continue to collaborate with SNF SW on any potential [facility] Care Management needs.   [name], MSN-Ed, RN,BSN [place] Acute Care Coordinator [phone] [facility]) [phone]  (Toll free office)

## 2019-08-11 DIAGNOSIS — E039 Hypothyroidism, unspecified: Secondary | ICD-10-CM | POA: Diagnosis not present

## 2019-08-11 DIAGNOSIS — E1140 Type 2 diabetes mellitus with diabetic neuropathy, unspecified: Secondary | ICD-10-CM | POA: Diagnosis not present

## 2019-08-11 DIAGNOSIS — I25119 Atherosclerotic heart disease of native coronary artery with unspecified angina pectoris: Secondary | ICD-10-CM | POA: Diagnosis not present

## 2019-08-11 DIAGNOSIS — E1165 Type 2 diabetes mellitus with hyperglycemia: Secondary | ICD-10-CM | POA: Diagnosis not present

## 2019-08-11 DIAGNOSIS — I10 Essential (primary) hypertension: Secondary | ICD-10-CM | POA: Diagnosis not present

## 2019-08-11 DIAGNOSIS — G4700 Insomnia, unspecified: Secondary | ICD-10-CM | POA: Diagnosis not present

## 2019-08-11 DIAGNOSIS — E1142 Type 2 diabetes mellitus with diabetic polyneuropathy: Secondary | ICD-10-CM | POA: Diagnosis not present

## 2019-08-11 DIAGNOSIS — R4189 Other symptoms and signs involving cognitive functions and awareness: Secondary | ICD-10-CM | POA: Diagnosis not present

## 2019-08-11 DIAGNOSIS — M170 Bilateral primary osteoarthritis of knee: Secondary | ICD-10-CM | POA: Diagnosis not present

## 2019-08-11 DIAGNOSIS — F330 Major depressive disorder, recurrent, mild: Secondary | ICD-10-CM | POA: Diagnosis not present

## 2019-08-11 DIAGNOSIS — J45909 Unspecified asthma, uncomplicated: Secondary | ICD-10-CM | POA: Diagnosis not present

## 2019-08-11 DIAGNOSIS — E785 Hyperlipidemia, unspecified: Secondary | ICD-10-CM | POA: Diagnosis not present

## 2019-08-11 DIAGNOSIS — F418 Other specified anxiety disorders: Secondary | ICD-10-CM | POA: Diagnosis not present

## 2019-08-11 DIAGNOSIS — J449 Chronic obstructive pulmonary disease, unspecified: Secondary | ICD-10-CM | POA: Diagnosis not present

## 2019-08-12 DIAGNOSIS — I7389 Other specified peripheral vascular diseases: Secondary | ICD-10-CM | POA: Diagnosis not present

## 2019-08-12 DIAGNOSIS — E871 Hypo-osmolality and hyponatremia: Secondary | ICD-10-CM | POA: Diagnosis not present

## 2019-08-12 DIAGNOSIS — J441 Chronic obstructive pulmonary disease with (acute) exacerbation: Secondary | ICD-10-CM | POA: Diagnosis not present

## 2019-08-12 DIAGNOSIS — E876 Hypokalemia: Secondary | ICD-10-CM | POA: Diagnosis not present

## 2019-08-12 DIAGNOSIS — J810 Acute pulmonary edema: Secondary | ICD-10-CM | POA: Diagnosis not present

## 2019-08-12 DIAGNOSIS — J9601 Acute respiratory failure with hypoxia: Secondary | ICD-10-CM | POA: Diagnosis not present

## 2019-08-12 DIAGNOSIS — I10 Essential (primary) hypertension: Secondary | ICD-10-CM | POA: Diagnosis not present

## 2019-08-12 DIAGNOSIS — E119 Type 2 diabetes mellitus without complications: Secondary | ICD-10-CM | POA: Diagnosis not present

## 2019-08-12 DIAGNOSIS — E7849 Other hyperlipidemia: Secondary | ICD-10-CM | POA: Diagnosis not present

## 2019-08-12 DIAGNOSIS — E038 Other specified hypothyroidism: Secondary | ICD-10-CM | POA: Diagnosis not present

## 2019-08-13 ENCOUNTER — Other Ambulatory Visit: Payer: Self-pay | Admitting: *Deleted

## 2019-08-13 NOTE — Patient Outreach (Signed)
[facility] Post- Acute Care Coordinator follow up. Member screened for potential [facility] Care Management needs as a benefit of [place] Medicare.  Communication with [facility] and [facility] Facility care plan meeting scheduled for this Friday. Will know more regarding discharge plans at that time.   Will continue to follow and collaborate with SNF SW while member resides in SNF.   [name], MSN-Ed, RN,BSN [place] Acute Care Coordinator [phone] [facility]) [phone]  (Toll free office)

## 2019-08-14 DIAGNOSIS — Z794 Long term (current) use of insulin: Secondary | ICD-10-CM | POA: Diagnosis not present

## 2019-08-14 DIAGNOSIS — J9610 Chronic respiratory failure, unspecified whether with hypoxia or hypercapnia: Secondary | ICD-10-CM | POA: Diagnosis not present

## 2019-08-14 DIAGNOSIS — E119 Type 2 diabetes mellitus without complications: Secondary | ICD-10-CM | POA: Diagnosis not present

## 2019-08-14 DIAGNOSIS — I498 Other specified cardiac arrhythmias: Secondary | ICD-10-CM | POA: Diagnosis not present

## 2019-08-14 DIAGNOSIS — J984 Other disorders of lung: Secondary | ICD-10-CM | POA: Diagnosis not present

## 2019-08-14 DIAGNOSIS — Z7952 Long term (current) use of systemic steroids: Secondary | ICD-10-CM | POA: Diagnosis not present

## 2019-08-18 ENCOUNTER — Other Ambulatory Visit: Payer: Self-pay | Admitting: *Deleted

## 2019-08-18 DIAGNOSIS — E119 Type 2 diabetes mellitus without complications: Secondary | ICD-10-CM | POA: Diagnosis not present

## 2019-08-18 DIAGNOSIS — G478 Other sleep disorders: Secondary | ICD-10-CM | POA: Diagnosis not present

## 2019-08-18 DIAGNOSIS — Z794 Long term (current) use of insulin: Secondary | ICD-10-CM | POA: Diagnosis not present

## 2019-08-18 DIAGNOSIS — J188 Other pneumonia, unspecified organism: Secondary | ICD-10-CM | POA: Diagnosis not present

## 2019-08-18 DIAGNOSIS — R042 Hemoptysis: Secondary | ICD-10-CM | POA: Diagnosis not present

## 2019-08-18 NOTE — Patient Outreach (Signed)
[facility] Post- Acute Care Coordinator follow up. Member screened for potential [facility] Care Management needs as a benefit of [place] Medicare.  Mr. [place] is receiving skilled therapy at [facility].   Facility SW reports member's transition plan is for ALF placement. States therapy thinks member can get to ALF level of care.   Will continue to follow while member resides in SNF. Will plan to sign off when Mr. Parkison discharges to ALF.    [name], MSN-Ed, RN,BSN [place] Acute Care Coordinator [phone] [facility]) [phone]  (Toll free office)

## 2019-08-20 ENCOUNTER — Non-Acute Institutional Stay: Payer: Medicare Other | Admitting: Internal Medicine

## 2019-08-20 DIAGNOSIS — J42 Unspecified chronic bronchitis: Secondary | ICD-10-CM | POA: Diagnosis not present

## 2019-08-20 DIAGNOSIS — Z515 Encounter for palliative care: Secondary | ICD-10-CM | POA: Diagnosis not present

## 2019-08-20 DIAGNOSIS — R4189 Other symptoms and signs involving cognitive functions and awareness: Secondary | ICD-10-CM | POA: Diagnosis not present

## 2019-08-20 NOTE — Progress Notes (Signed)
[facility] Consult Note Telephone: [phone]  Fax: [phone]  PATIENT NAME: Ernest Clark. DOB: 1931/01/14 MRN: [id number]  PRIMARY CARE PROVIDER:   Hulan Fess, MD  REFERRING PROVIDER:  Hulan Fess, MD [place],  [place] [address]  RESPONSIBLE PARTY:    Ernest, Clark    [phone]      RECOMMENDATIONS and PLAN:  Palliative care encounter  Z51.5  1.  Advance care planning:  MOST and DNR forms completed previously and on pt chart.  Primary goal is to stabalize respiratory status and improve strength.  No plans for return to private home per son.  Palliative care will f/u with pt in aprox 3 weeks.   2.  Shortness of breath:  Related to COPD.  Improved.  Continue supplemental O2 and titrate to O2 of 90% or greater. Monitor for potential decline  3.  Generalized weakness:  Deconditioned.  Continue PT/OT.  Fall risk prevention. He will require additional personal assistance going forward.   4.  Agitation:  Consider sundowning.  Psych evaluation completed and notes, anxiety, depression and cognitive decline.  Continue Cymbalta daily. Potential need for additional meds related to behavioral disturbance.  Monitor.   I spent 35 minutes providing this consultation,  from 1430 to 1505. More than 50% of the time in this consultation was spent coordinating communication with patient, clinical  and SW staff.   HISTORY OF PRESENT ILLNESS:  Ernest Clark. is a 86 y.o. year old male with multiple medical problems including COPD, T2DM and PVD.  Staff and patient report improvement of respiratory status. He still requires use of supplemental oxygen and assitance with ADLs. Reports of confusion with some agitation during evening hours.  Palliative Care was asked to help address goals of care.   CODE STATUS: DNAR/DNI  PPS: 40% weak HOSPICE ELIGIBILITY/DIAGNOSIS: TBD  PAST MEDICAL HISTORY:  Past Medical  History:  Diagnosis Date  . COPD (chronic obstructive pulmonary disease) ([place])   . Coronary heart disease    remote PCI  . Diabetes mellitus   . DVT (deep venous thrombosis) ([place]) 2011  . Gout   . Hyperlipidemia   . Hypertension   . Peripheral arterial disease ([place])     SOCIAL HX: Normally lives alone  ALLERGIES:  Allergies  Allergen Reactions  . Actos [Pioglitazone Hydrochloride] Other (See Comments)    RIGHT LEG SWELLING   . Metformin And Related Other (See Comments)    Reaction unknown     PERTINENT MEDICATIONS:  Outpatient Encounter Medications as of 08/20/2019  Medication Sig  . Acetaminophen 500 MG capsule Take 1,000 mg by mouth every 4 (four) hours as needed for pain.  Marland Kitchen albuterol (PROAIR HFA) 108 (90 BASE) MCG/ACT inhaler Inhale 2 puffs into the lungs every 6 (six) hours as needed for wheezing or shortness of breath.   . allopurinol (ZYLOPRIM) 100 MG tablet Take 100 mg by mouth daily.   Jearl Klinefelter ELLIPTA 62.5-25 MCG/INH AEPB USE 1 INHALATION ORALLY ONE TIME DAILY (Patient taking differently: Inhale 1 puff into the lungs daily. )  . aspirin 81 MG tablet Take 81 mg by mouth daily.   . BD PEN NEEDLE NANO U/F 32G X 4 MM MISC   . budesonide (PULMICORT) 0.25 MG/2ML nebulizer solution Take 2 mLs (0.25 mg total) by nebulization 2 (two) times daily.  . cetirizine (ZYRTEC) 10 MG tablet Take 10 mg by mouth daily as needed for allergies.  Marland Kitchen docusate sodium (  STOOL SOFTENER) 100 MG capsule Take 200 mg by mouth 2 (two) times daily.   . DULoxetine (CYMBALTA) 30 MG capsule Take 30 mg by mouth daily.  . Ferrous Sulfate (IRON) 325 (65 Fe) MG TABS Take 325 mg by mouth daily.  . furosemide (LASIX) 40 MG tablet Take 1 tablet (40 mg total) by mouth daily.  Marland Kitchen HUMALOG KWIKPEN 100 UNIT/ML KwikPen Inject 8 Units into the skin daily with lunch.   Marland Kitchen ipratropium-albuterol (DUONEB) 0.5-2.5 (3) MG/3ML SOLN Take 3 mLs by nebulization every 6 (six) hours as needed.  Marland Kitchen levothyroxine (SYNTHROID) 75 MCG  tablet Take 75 mcg by mouth daily before breakfast.   . omeprazole (PRILOSEC) 20 MG capsule Take 20 mg by mouth 2 (two) times daily.  Marland Kitchen oxyCODONE (OXY IR/ROXICODONE) 5 MG immediate release tablet Take 1 tablet (5 mg total) by mouth every 4 (four) hours as needed for moderate pain.  . polyethylene glycol (MIRALAX / GLYCOLAX) packet Take 17 g by mouth daily as needed for moderate constipation. (Patient not taking: Reported on 07/14/2019)  . predniSONE (DELTASONE) 10 MG tablet Take 1 tablet (10 mg total) by mouth daily with breakfast. Continue indefinately  . rosuvastatin (CRESTOR) 40 MG tablet Take 40 mg by mouth daily.    Tyler Aas FLEXTOUCH 100 UNIT/ML SOPN FlexTouch Pen Inject 26 Units into the skin in the morning.    No facility-administered encounter medications on file as of 08/20/2019.    PHYSICAL EXAM:   General: NAD, chronically ill appearing, obese elderly male sitting in bedside chair Cardiovascular: regular rate and rhythm Pulmonary: fine expiratory wheeze LLL otherwise clear Abdomen: large, soft, nontender, + bowel sounds Extremities: no edema Skin: scattered ecchymosis BUE Neurological: A&O x3, Weakness Psych:  Calm and cooperative  Gonzella Lex, NP-C

## 2019-08-21 ENCOUNTER — Other Ambulatory Visit: Payer: Self-pay

## 2019-08-25 ENCOUNTER — Other Ambulatory Visit: Payer: Self-pay | Admitting: *Deleted

## 2019-08-25 DIAGNOSIS — F330 Major depressive disorder, recurrent, mild: Secondary | ICD-10-CM | POA: Diagnosis not present

## 2019-08-25 DIAGNOSIS — R4189 Other symptoms and signs involving cognitive functions and awareness: Secondary | ICD-10-CM | POA: Diagnosis not present

## 2019-08-25 DIAGNOSIS — F418 Other specified anxiety disorders: Secondary | ICD-10-CM | POA: Diagnosis not present

## 2019-08-25 DIAGNOSIS — G4700 Insomnia, unspecified: Secondary | ICD-10-CM | POA: Diagnosis not present

## 2019-08-25 NOTE — Patient Outreach (Signed)
[facility] Post- Acute Care Coordinator follow up.   Update received from [facility] SW. Transition plan is for ALF as long as he continues to do well with therapy.  Will continue to follow while member resides in SNF.    Raiford Noble, MSN-Ed, RN,BSN [facility] Post Acute Care Coordinator [phone] [facility]) [phone]  (Toll free office)

## 2019-08-25 NOTE — Patient Outreach (Signed)
[facility] Post- Acute Care Coordinator follow up. Member screened for potential [facility] Care Management needs as a benefit of [place] [employer].  Per Patient Pearletha Forge member remains in [facility].   Communication sent to [facility] to request update on transtion plans.   Will continue to follow while member resides in SNF.   [name], MSN-Ed, RN,BSN [place] Acute Care Coordinator [phone] [facility]) [phone]  (Toll free office)

## 2019-08-26 DIAGNOSIS — R252 Cramp and spasm: Secondary | ICD-10-CM | POA: Diagnosis not present

## 2019-08-26 DIAGNOSIS — I7389 Other specified peripheral vascular diseases: Secondary | ICD-10-CM | POA: Diagnosis not present

## 2019-09-03 ENCOUNTER — Other Ambulatory Visit: Payer: Self-pay | Admitting: *Deleted

## 2019-09-03 NOTE — Patient Outreach (Signed)
[facility] Post- Acute Care Coordinator follow up. Member screened for potential [facility] Care Management needs as a benefit of [employer] Medicare.  Per Patient Ernest Clark member resides in [place].    Communication sent to facility SW to request update on transtion plans.   Will continue to follow while member resides in SNF.  [name], MSN-Ed, RN,BSN [place] Acute Care Coordinator [phone] [facility]) [phone]  (Toll free office)

## 2019-09-04 DIAGNOSIS — J9601 Acute respiratory failure with hypoxia: Secondary | ICD-10-CM | POA: Diagnosis not present

## 2019-09-04 DIAGNOSIS — J449 Chronic obstructive pulmonary disease, unspecified: Secondary | ICD-10-CM | POA: Diagnosis not present

## 2019-09-04 DIAGNOSIS — R634 Abnormal weight loss: Secondary | ICD-10-CM | POA: Diagnosis not present

## 2019-09-04 DIAGNOSIS — G4700 Insomnia, unspecified: Secondary | ICD-10-CM | POA: Diagnosis not present

## 2019-09-12 DIAGNOSIS — J449 Chronic obstructive pulmonary disease, unspecified: Secondary | ICD-10-CM | POA: Diagnosis not present

## 2019-09-12 DIAGNOSIS — I739 Peripheral vascular disease, unspecified: Secondary | ICD-10-CM | POA: Diagnosis not present

## 2019-09-12 DIAGNOSIS — E871 Hypo-osmolality and hyponatremia: Secondary | ICD-10-CM | POA: Diagnosis not present

## 2019-09-12 DIAGNOSIS — J9610 Chronic respiratory failure, unspecified whether with hypoxia or hypercapnia: Secondary | ICD-10-CM | POA: Diagnosis not present

## 2019-09-16 DIAGNOSIS — R2689 Other abnormalities of gait and mobility: Secondary | ICD-10-CM | POA: Diagnosis not present

## 2019-09-16 DIAGNOSIS — R41841 Cognitive communication deficit: Secondary | ICD-10-CM | POA: Diagnosis not present

## 2019-09-16 DIAGNOSIS — R2681 Unsteadiness on feet: Secondary | ICD-10-CM | POA: Diagnosis not present

## 2019-09-16 DIAGNOSIS — J441 Chronic obstructive pulmonary disease with (acute) exacerbation: Secondary | ICD-10-CM | POA: Diagnosis not present

## 2019-09-16 DIAGNOSIS — M6258 Muscle wasting and atrophy, not elsewhere classified, other site: Secondary | ICD-10-CM | POA: Diagnosis not present

## 2019-09-16 DIAGNOSIS — R1312 Dysphagia, oropharyngeal phase: Secondary | ICD-10-CM | POA: Diagnosis not present

## 2019-09-16 DIAGNOSIS — M6281 Muscle weakness (generalized): Secondary | ICD-10-CM | POA: Diagnosis not present

## 2019-09-16 DIAGNOSIS — R498 Other voice and resonance disorders: Secondary | ICD-10-CM | POA: Diagnosis not present

## 2019-09-17 ENCOUNTER — Other Ambulatory Visit: Payer: Self-pay

## 2019-09-17 ENCOUNTER — Ambulatory Visit (INDEPENDENT_AMBULATORY_CARE_PROVIDER_SITE_OTHER): Payer: Medicare Other | Admitting: Emergency Medicine

## 2019-09-17 ENCOUNTER — Encounter: Payer: Self-pay | Admitting: Emergency Medicine

## 2019-09-17 ENCOUNTER — Other Ambulatory Visit: Payer: Self-pay | Admitting: *Deleted

## 2019-09-17 VITALS — BP 110/60 | HR 84 | Temp 96.8°F | Ht 68.0 in | Wt 211.0 lb

## 2019-09-17 DIAGNOSIS — M6258 Muscle wasting and atrophy, not elsewhere classified, other site: Secondary | ICD-10-CM | POA: Diagnosis not present

## 2019-09-17 DIAGNOSIS — R2689 Other abnormalities of gait and mobility: Secondary | ICD-10-CM | POA: Diagnosis not present

## 2019-09-17 DIAGNOSIS — J42 Unspecified chronic bronchitis: Secondary | ICD-10-CM | POA: Diagnosis not present

## 2019-09-17 DIAGNOSIS — M6281 Muscle weakness (generalized): Secondary | ICD-10-CM | POA: Diagnosis not present

## 2019-09-17 DIAGNOSIS — R1312 Dysphagia, oropharyngeal phase: Secondary | ICD-10-CM | POA: Diagnosis not present

## 2019-09-17 DIAGNOSIS — R41841 Cognitive communication deficit: Secondary | ICD-10-CM | POA: Diagnosis not present

## 2019-09-17 DIAGNOSIS — J9601 Acute respiratory failure with hypoxia: Secondary | ICD-10-CM | POA: Diagnosis not present

## 2019-09-17 DIAGNOSIS — J441 Chronic obstructive pulmonary disease with (acute) exacerbation: Secondary | ICD-10-CM | POA: Diagnosis not present

## 2019-09-17 NOTE — Progress Notes (Signed)
Subjective:    Patient ID: Ernest Clark., male    DOB: 05/11/1931, 86 y.o.   MRN: [id number]  HPI  ROV 07/16/2018 --annual follow-up for 86 year old gentleman with moderate severe COPD based on spirometry, allergic rhinitis with some upper airway irritation and associated cough.  He also has a history of small stable pulmonary nodular disease as described above.  Currently managed on Anoro, has albuterol which he uses approximately every other day. He hears wheeze occasionally, especially in the evening. His exercise tolerance is poor due to R knee pain and limitation. No flares, no ED visits. He reports poor sleep.   ROV 09/17/19 --86 year old man with moderate severe COPD, 4 mm pulmonary nodule that was stable on a 1 year follow-up, benign.  He was admitted with an acute exacerbation COPD in July 2021.  I saw him in the hospital.  COVID-19 testing was negative.  He was treated with bronchodilators, corticosteroids, azithromycin.  He was found to require oxygen at 2 L/min.  His presentation was more subacute as opposed to a flare.  He was discharged to SNF. Based on his med list it looks like he was just started on pred 10mg  daily on 9/11. Intermittent coughing - can have paroxysms, recent one after possibly getting choked up on a pill. He has been on O2 2L/min, but he doesn't have it with him here.    Review of Systems  Constitutional: Negative for fever and unexpected weight change.  HENT: Negative for congestion, dental problem, ear pain, nosebleeds, postnasal drip, rhinorrhea, sinus pressure, sneezing, sore throat and trouble swallowing.   Eyes: Negative for redness and itching.  Respiratory: Negative for cough, chest tightness, shortness of breath and wheezing.   Cardiovascular: Negative for palpitations and leg swelling.  Gastrointestinal: Negative for nausea and vomiting.  Genitourinary: Negative for dysuria.  Musculoskeletal: Negative for joint swelling.  Skin: Negative for rash.   Neurological: Negative for headaches.  Hematological: Does not bruise/bleed easily.  Psychiatric/Behavioral: Negative for dysphoric mood. The patient is not nervous/anxious.        Objective:   Physical Exam Vitals:   09/17/19 1535  BP: 110/60  Pulse: 84  Temp: (!) 96.8 F (36 C)  TempSrc: Temporal  SpO2: 90%  Weight: 211 lb (95.7 kg)  Height: 5\' 8"  (1.727 m)   Gen: Pleasant, overweight elderly gentleman, in no distress,  normal affect, in wheelchair  ENT: No lesions,  mouth clear,  oropharynx clear, no postnasal drip, very poor hearing  Neck: No JVD, no stridor, strong voice.   Lungs: No use of accessory muscles, distant, few scattered exp wheezes.   Cardiovascular: RRR, heart sounds normal, no murmur or gallops, 2+ peripheral edema  Musculoskeletal: No deformities, no cyanosis or clubbing  Neuro: alert, non focal  Skin: Warm, no lesions or rash    Assessment & Plan:  COPD (chronic obstructive pulmonary disease) ([place]) Clarified his medical regimen.  Plan to continue prednisone 10 mg long-term.  I also confirmed that he is working with palliative care as an outpatient and that his CODE STATUS should reflect DNR/DNI.  We will continue Anoro 1 inhalation once daily. Continue Flovent 1 inhalation once daily.  Rinse and gargle after using this medication. Use Combivent Respimat, 2 puffs up to every 6 hours if you need it for shortness of breath.  You will have to ask for this medication.  You do not need to take it on a schedule. Continue prednisone 10 mg daily indefinitely.  Agree with  staying on this medicine. Continue Mucinex 600 mg twice a day indefinitely Continue Singulair 10 mg each evening Follow with Dr Lamonte Sakai in 6 months or sooner if you have any problems  Acute hypoxemic respiratory failure ([place]) Wear your oxygen at 2 L/min with exertion.  Our goal is to keep your oxygen saturations greater than 90%.  Baltazar Apo, MD, PhD 09/17/2019, 4:17 PM   Pulmonary and Critical Care [phone] or if no answer [phone]

## 2019-09-17 NOTE — Patient Outreach (Signed)
Member screened for potential [facility] Care Management needs as a benefit of [place] [employer].  Confirmed with [facility] that Ernest Clark transitioned to long term care at [facility].  No identifiable [facility] Care Management needs at this time.    [name], MSN-Ed, RN,BSN [place] Acute Care Coordinator [phone] [facility]) [phone]  (Toll free office)

## 2019-09-17 NOTE — Assessment & Plan Note (Signed)
Wear your oxygen at 2 L/min with exertion.  Our goal is to keep your oxygen saturations greater than 90%.

## 2019-09-17 NOTE — Patient Instructions (Signed)
We will continue Anoro 1 inhalation once daily. Continue Flovent 1 inhalation once daily.  Rinse and gargle after using this medication. Use Combivent Respimat, 2 puffs up to every 6 hours if you need it for shortness of breath.  You will have to ask for this medication.  You do not need to take it on a schedule. Continue prednisone 10 mg daily indefinitely.  Agree with staying on this medicine. Continue Mucinex 600 mg twice a day indefinitely Continue Singulair 10 mg each evening Wear your oxygen at 2 L/min with exertion.  Our goal is to keep your oxygen saturations greater than 90%. Follow with Dr Lamonte Sakai in 6 months or sooner if you have any problems

## 2019-09-17 NOTE — Assessment & Plan Note (Addendum)
Clarified his medical regimen.  Plan to continue prednisone 10 mg long-term.  I also confirmed that he is working with palliative care as an outpatient and that his CODE STATUS should reflect DNR/DNI.  We will continue Anoro 1 inhalation once daily. Continue Flovent 1 inhalation once daily.  Rinse and gargle after using this medication. Use Combivent Respimat, 2 puffs up to every 6 hours if you need it for shortness of breath.  You will have to ask for this medication.  You do not need to take it on a schedule. Continue prednisone 10 mg daily indefinitely.  Agree with staying on this medicine. Continue Mucinex 600 mg twice a day indefinitely Continue Singulair 10 mg each evening Follow with Dr Lamonte Sakai in 6 months or sooner if you have any problems

## 2019-09-18 DIAGNOSIS — R41841 Cognitive communication deficit: Secondary | ICD-10-CM | POA: Diagnosis not present

## 2019-09-18 DIAGNOSIS — M6281 Muscle weakness (generalized): Secondary | ICD-10-CM | POA: Diagnosis not present

## 2019-09-18 DIAGNOSIS — M6258 Muscle wasting and atrophy, not elsewhere classified, other site: Secondary | ICD-10-CM | POA: Diagnosis not present

## 2019-09-18 DIAGNOSIS — R1312 Dysphagia, oropharyngeal phase: Secondary | ICD-10-CM | POA: Diagnosis not present

## 2019-09-18 DIAGNOSIS — R2689 Other abnormalities of gait and mobility: Secondary | ICD-10-CM | POA: Diagnosis not present

## 2019-09-18 DIAGNOSIS — J441 Chronic obstructive pulmonary disease with (acute) exacerbation: Secondary | ICD-10-CM | POA: Diagnosis not present

## 2019-09-29 DIAGNOSIS — M6281 Muscle weakness (generalized): Secondary | ICD-10-CM | POA: Diagnosis not present

## 2019-09-29 DIAGNOSIS — R1312 Dysphagia, oropharyngeal phase: Secondary | ICD-10-CM | POA: Diagnosis not present

## 2019-09-29 DIAGNOSIS — R41841 Cognitive communication deficit: Secondary | ICD-10-CM | POA: Diagnosis not present

## 2019-09-29 DIAGNOSIS — M6258 Muscle wasting and atrophy, not elsewhere classified, other site: Secondary | ICD-10-CM | POA: Diagnosis not present

## 2019-09-29 DIAGNOSIS — J441 Chronic obstructive pulmonary disease with (acute) exacerbation: Secondary | ICD-10-CM | POA: Diagnosis not present

## 2019-09-29 DIAGNOSIS — R2689 Other abnormalities of gait and mobility: Secondary | ICD-10-CM | POA: Diagnosis not present

## 2019-09-30 DIAGNOSIS — M6281 Muscle weakness (generalized): Secondary | ICD-10-CM | POA: Diagnosis not present

## 2019-09-30 DIAGNOSIS — R1312 Dysphagia, oropharyngeal phase: Secondary | ICD-10-CM | POA: Diagnosis not present

## 2019-09-30 DIAGNOSIS — R41841 Cognitive communication deficit: Secondary | ICD-10-CM | POA: Diagnosis not present

## 2019-09-30 DIAGNOSIS — J441 Chronic obstructive pulmonary disease with (acute) exacerbation: Secondary | ICD-10-CM | POA: Diagnosis not present

## 2019-09-30 DIAGNOSIS — M6258 Muscle wasting and atrophy, not elsewhere classified, other site: Secondary | ICD-10-CM | POA: Diagnosis not present

## 2019-09-30 DIAGNOSIS — R2689 Other abnormalities of gait and mobility: Secondary | ICD-10-CM | POA: Diagnosis not present

## 2019-10-01 DIAGNOSIS — R1312 Dysphagia, oropharyngeal phase: Secondary | ICD-10-CM | POA: Diagnosis not present

## 2019-10-01 DIAGNOSIS — R2689 Other abnormalities of gait and mobility: Secondary | ICD-10-CM | POA: Diagnosis not present

## 2019-10-01 DIAGNOSIS — M6258 Muscle wasting and atrophy, not elsewhere classified, other site: Secondary | ICD-10-CM | POA: Diagnosis not present

## 2019-10-01 DIAGNOSIS — M6281 Muscle weakness (generalized): Secondary | ICD-10-CM | POA: Diagnosis not present

## 2019-10-01 DIAGNOSIS — J441 Chronic obstructive pulmonary disease with (acute) exacerbation: Secondary | ICD-10-CM | POA: Diagnosis not present

## 2019-10-01 DIAGNOSIS — R41841 Cognitive communication deficit: Secondary | ICD-10-CM | POA: Diagnosis not present

## 2019-10-02 DIAGNOSIS — R1312 Dysphagia, oropharyngeal phase: Secondary | ICD-10-CM | POA: Diagnosis not present

## 2019-10-02 DIAGNOSIS — M6258 Muscle wasting and atrophy, not elsewhere classified, other site: Secondary | ICD-10-CM | POA: Diagnosis not present

## 2019-10-02 DIAGNOSIS — J441 Chronic obstructive pulmonary disease with (acute) exacerbation: Secondary | ICD-10-CM | POA: Diagnosis not present

## 2019-10-02 DIAGNOSIS — M6281 Muscle weakness (generalized): Secondary | ICD-10-CM | POA: Diagnosis not present

## 2019-10-02 DIAGNOSIS — R41841 Cognitive communication deficit: Secondary | ICD-10-CM | POA: Diagnosis not present

## 2019-10-02 DIAGNOSIS — R2689 Other abnormalities of gait and mobility: Secondary | ICD-10-CM | POA: Diagnosis not present

## 2019-10-03 DIAGNOSIS — J441 Chronic obstructive pulmonary disease with (acute) exacerbation: Secondary | ICD-10-CM | POA: Diagnosis not present

## 2019-10-03 DIAGNOSIS — R1312 Dysphagia, oropharyngeal phase: Secondary | ICD-10-CM | POA: Diagnosis not present

## 2019-10-03 DIAGNOSIS — M6258 Muscle wasting and atrophy, not elsewhere classified, other site: Secondary | ICD-10-CM | POA: Diagnosis not present

## 2019-10-03 DIAGNOSIS — R498 Other voice and resonance disorders: Secondary | ICD-10-CM | POA: Diagnosis not present

## 2019-10-03 DIAGNOSIS — M6281 Muscle weakness (generalized): Secondary | ICD-10-CM | POA: Diagnosis not present

## 2019-10-03 DIAGNOSIS — R41841 Cognitive communication deficit: Secondary | ICD-10-CM | POA: Diagnosis not present

## 2019-10-03 DIAGNOSIS — R2681 Unsteadiness on feet: Secondary | ICD-10-CM | POA: Diagnosis not present

## 2019-10-03 DIAGNOSIS — R2689 Other abnormalities of gait and mobility: Secondary | ICD-10-CM | POA: Diagnosis not present

## 2019-10-16 DIAGNOSIS — Z794 Long term (current) use of insulin: Secondary | ICD-10-CM | POA: Diagnosis not present

## 2019-10-16 DIAGNOSIS — F3489 Other specified persistent mood disorders: Secondary | ICD-10-CM | POA: Diagnosis not present

## 2019-10-16 DIAGNOSIS — I10 Essential (primary) hypertension: Secondary | ICD-10-CM | POA: Diagnosis not present

## 2019-10-16 DIAGNOSIS — E7849 Other hyperlipidemia: Secondary | ICD-10-CM | POA: Diagnosis not present

## 2019-10-16 DIAGNOSIS — E119 Type 2 diabetes mellitus without complications: Secondary | ICD-10-CM | POA: Diagnosis not present

## 2019-10-16 DIAGNOSIS — R634 Abnormal weight loss: Secondary | ICD-10-CM | POA: Diagnosis not present

## 2019-10-16 DIAGNOSIS — R5381 Other malaise: Secondary | ICD-10-CM | POA: Diagnosis not present

## 2019-10-16 DIAGNOSIS — I7389 Other specified peripheral vascular diseases: Secondary | ICD-10-CM | POA: Diagnosis not present

## 2019-10-16 DIAGNOSIS — E038 Other specified hypothyroidism: Secondary | ICD-10-CM | POA: Diagnosis not present

## 2019-10-16 DIAGNOSIS — J9610 Chronic respiratory failure, unspecified whether with hypoxia or hypercapnia: Secondary | ICD-10-CM | POA: Diagnosis not present

## 2019-10-16 DIAGNOSIS — J984 Other disorders of lung: Secondary | ICD-10-CM | POA: Diagnosis not present

## 2019-10-17 DIAGNOSIS — E876 Hypokalemia: Secondary | ICD-10-CM | POA: Diagnosis not present

## 2019-10-17 DIAGNOSIS — E1159 Type 2 diabetes mellitus with other circulatory complications: Secondary | ICD-10-CM | POA: Diagnosis not present

## 2019-10-17 DIAGNOSIS — E871 Hypo-osmolality and hyponatremia: Secondary | ICD-10-CM | POA: Diagnosis not present

## 2019-10-20 DIAGNOSIS — R7981 Abnormal blood-gas level: Secondary | ICD-10-CM | POA: Diagnosis not present

## 2019-10-27 DIAGNOSIS — Z794 Long term (current) use of insulin: Secondary | ICD-10-CM | POA: Diagnosis not present

## 2019-10-27 DIAGNOSIS — E119 Type 2 diabetes mellitus without complications: Secondary | ICD-10-CM | POA: Diagnosis not present

## 2019-10-29 DIAGNOSIS — Z23 Encounter for immunization: Secondary | ICD-10-CM | POA: Diagnosis not present

## 2019-11-05 DIAGNOSIS — Z794 Long term (current) use of insulin: Secondary | ICD-10-CM | POA: Diagnosis not present

## 2019-11-05 DIAGNOSIS — E119 Type 2 diabetes mellitus without complications: Secondary | ICD-10-CM | POA: Diagnosis not present

## 2019-11-05 DIAGNOSIS — R634 Abnormal weight loss: Secondary | ICD-10-CM | POA: Diagnosis not present

## 2019-11-13 DIAGNOSIS — R634 Abnormal weight loss: Secondary | ICD-10-CM | POA: Diagnosis not present

## 2019-11-13 DIAGNOSIS — E118 Type 2 diabetes mellitus with unspecified complications: Secondary | ICD-10-CM | POA: Diagnosis not present

## 2019-11-13 DIAGNOSIS — J441 Chronic obstructive pulmonary disease with (acute) exacerbation: Secondary | ICD-10-CM | POA: Diagnosis not present

## 2019-11-13 DIAGNOSIS — J9611 Chronic respiratory failure with hypoxia: Secondary | ICD-10-CM | POA: Diagnosis not present

## 2019-12-02 DIAGNOSIS — J441 Chronic obstructive pulmonary disease with (acute) exacerbation: Secondary | ICD-10-CM | POA: Diagnosis not present

## 2019-12-02 DIAGNOSIS — Z23 Encounter for immunization: Secondary | ICD-10-CM | POA: Diagnosis not present

## 2019-12-02 DIAGNOSIS — R2681 Unsteadiness on feet: Secondary | ICD-10-CM | POA: Diagnosis not present

## 2019-12-02 DIAGNOSIS — R1312 Dysphagia, oropharyngeal phase: Secondary | ICD-10-CM | POA: Diagnosis not present

## 2019-12-02 DIAGNOSIS — R41841 Cognitive communication deficit: Secondary | ICD-10-CM | POA: Diagnosis not present

## 2019-12-02 DIAGNOSIS — M6281 Muscle weakness (generalized): Secondary | ICD-10-CM | POA: Diagnosis not present

## 2019-12-02 DIAGNOSIS — R2689 Other abnormalities of gait and mobility: Secondary | ICD-10-CM | POA: Diagnosis not present

## 2019-12-02 DIAGNOSIS — R498 Other voice and resonance disorders: Secondary | ICD-10-CM | POA: Diagnosis not present

## 2019-12-02 DIAGNOSIS — M6258 Muscle wasting and atrophy, not elsewhere classified, other site: Secondary | ICD-10-CM | POA: Diagnosis not present

## 2019-12-03 DIAGNOSIS — J441 Chronic obstructive pulmonary disease with (acute) exacerbation: Secondary | ICD-10-CM | POA: Diagnosis not present

## 2019-12-03 DIAGNOSIS — R1312 Dysphagia, oropharyngeal phase: Secondary | ICD-10-CM | POA: Diagnosis not present

## 2019-12-03 DIAGNOSIS — R2689 Other abnormalities of gait and mobility: Secondary | ICD-10-CM | POA: Diagnosis not present

## 2019-12-03 DIAGNOSIS — R41841 Cognitive communication deficit: Secondary | ICD-10-CM | POA: Diagnosis not present

## 2019-12-03 DIAGNOSIS — M6281 Muscle weakness (generalized): Secondary | ICD-10-CM | POA: Diagnosis not present

## 2019-12-03 DIAGNOSIS — M6258 Muscle wasting and atrophy, not elsewhere classified, other site: Secondary | ICD-10-CM | POA: Diagnosis not present

## 2019-12-03 DIAGNOSIS — R498 Other voice and resonance disorders: Secondary | ICD-10-CM | POA: Diagnosis not present

## 2019-12-03 DIAGNOSIS — R2681 Unsteadiness on feet: Secondary | ICD-10-CM | POA: Diagnosis not present

## 2019-12-04 DIAGNOSIS — J441 Chronic obstructive pulmonary disease with (acute) exacerbation: Secondary | ICD-10-CM | POA: Diagnosis not present

## 2019-12-04 DIAGNOSIS — M6281 Muscle weakness (generalized): Secondary | ICD-10-CM | POA: Diagnosis not present

## 2019-12-04 DIAGNOSIS — R2689 Other abnormalities of gait and mobility: Secondary | ICD-10-CM | POA: Diagnosis not present

## 2019-12-04 DIAGNOSIS — M6258 Muscle wasting and atrophy, not elsewhere classified, other site: Secondary | ICD-10-CM | POA: Diagnosis not present

## 2019-12-04 DIAGNOSIS — R1312 Dysphagia, oropharyngeal phase: Secondary | ICD-10-CM | POA: Diagnosis not present

## 2019-12-04 DIAGNOSIS — R41841 Cognitive communication deficit: Secondary | ICD-10-CM | POA: Diagnosis not present

## 2019-12-05 DIAGNOSIS — M6258 Muscle wasting and atrophy, not elsewhere classified, other site: Secondary | ICD-10-CM | POA: Diagnosis not present

## 2019-12-05 DIAGNOSIS — J441 Chronic obstructive pulmonary disease with (acute) exacerbation: Secondary | ICD-10-CM | POA: Diagnosis not present

## 2019-12-05 DIAGNOSIS — M6281 Muscle weakness (generalized): Secondary | ICD-10-CM | POA: Diagnosis not present

## 2019-12-05 DIAGNOSIS — R41841 Cognitive communication deficit: Secondary | ICD-10-CM | POA: Diagnosis not present

## 2019-12-05 DIAGNOSIS — R1312 Dysphagia, oropharyngeal phase: Secondary | ICD-10-CM | POA: Diagnosis not present

## 2019-12-05 DIAGNOSIS — R2689 Other abnormalities of gait and mobility: Secondary | ICD-10-CM | POA: Diagnosis not present

## 2019-12-08 DIAGNOSIS — R41841 Cognitive communication deficit: Secondary | ICD-10-CM | POA: Diagnosis not present

## 2019-12-08 DIAGNOSIS — R1312 Dysphagia, oropharyngeal phase: Secondary | ICD-10-CM | POA: Diagnosis not present

## 2019-12-08 DIAGNOSIS — R2689 Other abnormalities of gait and mobility: Secondary | ICD-10-CM | POA: Diagnosis not present

## 2019-12-08 DIAGNOSIS — J441 Chronic obstructive pulmonary disease with (acute) exacerbation: Secondary | ICD-10-CM | POA: Diagnosis not present

## 2019-12-08 DIAGNOSIS — M6258 Muscle wasting and atrophy, not elsewhere classified, other site: Secondary | ICD-10-CM | POA: Diagnosis not present

## 2019-12-08 DIAGNOSIS — M6281 Muscle weakness (generalized): Secondary | ICD-10-CM | POA: Diagnosis not present

## 2019-12-09 DIAGNOSIS — M6258 Muscle wasting and atrophy, not elsewhere classified, other site: Secondary | ICD-10-CM | POA: Diagnosis not present

## 2019-12-09 DIAGNOSIS — R2689 Other abnormalities of gait and mobility: Secondary | ICD-10-CM | POA: Diagnosis not present

## 2019-12-09 DIAGNOSIS — J441 Chronic obstructive pulmonary disease with (acute) exacerbation: Secondary | ICD-10-CM | POA: Diagnosis not present

## 2019-12-09 DIAGNOSIS — R41841 Cognitive communication deficit: Secondary | ICD-10-CM | POA: Diagnosis not present

## 2019-12-09 DIAGNOSIS — M6281 Muscle weakness (generalized): Secondary | ICD-10-CM | POA: Diagnosis not present

## 2019-12-09 DIAGNOSIS — R1312 Dysphagia, oropharyngeal phase: Secondary | ICD-10-CM | POA: Diagnosis not present

## 2019-12-10 DIAGNOSIS — M6258 Muscle wasting and atrophy, not elsewhere classified, other site: Secondary | ICD-10-CM | POA: Diagnosis not present

## 2019-12-10 DIAGNOSIS — R2689 Other abnormalities of gait and mobility: Secondary | ICD-10-CM | POA: Diagnosis not present

## 2019-12-10 DIAGNOSIS — R1312 Dysphagia, oropharyngeal phase: Secondary | ICD-10-CM | POA: Diagnosis not present

## 2019-12-10 DIAGNOSIS — H1013 Acute atopic conjunctivitis, bilateral: Secondary | ICD-10-CM | POA: Diagnosis not present

## 2019-12-10 DIAGNOSIS — R252 Cramp and spasm: Secondary | ICD-10-CM | POA: Diagnosis not present

## 2019-12-10 DIAGNOSIS — R41841 Cognitive communication deficit: Secondary | ICD-10-CM | POA: Diagnosis not present

## 2019-12-10 DIAGNOSIS — M6281 Muscle weakness (generalized): Secondary | ICD-10-CM | POA: Diagnosis not present

## 2019-12-10 DIAGNOSIS — E119 Type 2 diabetes mellitus without complications: Secondary | ICD-10-CM | POA: Diagnosis not present

## 2019-12-10 DIAGNOSIS — R634 Abnormal weight loss: Secondary | ICD-10-CM | POA: Diagnosis not present

## 2019-12-10 DIAGNOSIS — J441 Chronic obstructive pulmonary disease with (acute) exacerbation: Secondary | ICD-10-CM | POA: Diagnosis not present

## 2019-12-11 DIAGNOSIS — R2689 Other abnormalities of gait and mobility: Secondary | ICD-10-CM | POA: Diagnosis not present

## 2019-12-11 DIAGNOSIS — R1312 Dysphagia, oropharyngeal phase: Secondary | ICD-10-CM | POA: Diagnosis not present

## 2019-12-11 DIAGNOSIS — M6258 Muscle wasting and atrophy, not elsewhere classified, other site: Secondary | ICD-10-CM | POA: Diagnosis not present

## 2019-12-11 DIAGNOSIS — R41841 Cognitive communication deficit: Secondary | ICD-10-CM | POA: Diagnosis not present

## 2019-12-11 DIAGNOSIS — M6281 Muscle weakness (generalized): Secondary | ICD-10-CM | POA: Diagnosis not present

## 2019-12-11 DIAGNOSIS — J441 Chronic obstructive pulmonary disease with (acute) exacerbation: Secondary | ICD-10-CM | POA: Diagnosis not present

## 2019-12-12 DIAGNOSIS — R2689 Other abnormalities of gait and mobility: Secondary | ICD-10-CM | POA: Diagnosis not present

## 2019-12-12 DIAGNOSIS — M6258 Muscle wasting and atrophy, not elsewhere classified, other site: Secondary | ICD-10-CM | POA: Diagnosis not present

## 2019-12-12 DIAGNOSIS — J441 Chronic obstructive pulmonary disease with (acute) exacerbation: Secondary | ICD-10-CM | POA: Diagnosis not present

## 2019-12-12 DIAGNOSIS — M6281 Muscle weakness (generalized): Secondary | ICD-10-CM | POA: Diagnosis not present

## 2019-12-12 DIAGNOSIS — R1312 Dysphagia, oropharyngeal phase: Secondary | ICD-10-CM | POA: Diagnosis not present

## 2019-12-12 DIAGNOSIS — R41841 Cognitive communication deficit: Secondary | ICD-10-CM | POA: Diagnosis not present

## 2019-12-13 DIAGNOSIS — J441 Chronic obstructive pulmonary disease with (acute) exacerbation: Secondary | ICD-10-CM | POA: Diagnosis not present

## 2019-12-13 DIAGNOSIS — R2689 Other abnormalities of gait and mobility: Secondary | ICD-10-CM | POA: Diagnosis not present

## 2019-12-13 DIAGNOSIS — M6258 Muscle wasting and atrophy, not elsewhere classified, other site: Secondary | ICD-10-CM | POA: Diagnosis not present

## 2019-12-13 DIAGNOSIS — M6281 Muscle weakness (generalized): Secondary | ICD-10-CM | POA: Diagnosis not present

## 2019-12-13 DIAGNOSIS — R1312 Dysphagia, oropharyngeal phase: Secondary | ICD-10-CM | POA: Diagnosis not present

## 2019-12-13 DIAGNOSIS — R41841 Cognitive communication deficit: Secondary | ICD-10-CM | POA: Diagnosis not present

## 2019-12-15 DIAGNOSIS — R2689 Other abnormalities of gait and mobility: Secondary | ICD-10-CM | POA: Diagnosis not present

## 2019-12-15 DIAGNOSIS — R41841 Cognitive communication deficit: Secondary | ICD-10-CM | POA: Diagnosis not present

## 2019-12-15 DIAGNOSIS — M6281 Muscle weakness (generalized): Secondary | ICD-10-CM | POA: Diagnosis not present

## 2019-12-15 DIAGNOSIS — J441 Chronic obstructive pulmonary disease with (acute) exacerbation: Secondary | ICD-10-CM | POA: Diagnosis not present

## 2019-12-15 DIAGNOSIS — R1312 Dysphagia, oropharyngeal phase: Secondary | ICD-10-CM | POA: Diagnosis not present

## 2019-12-15 DIAGNOSIS — M6258 Muscle wasting and atrophy, not elsewhere classified, other site: Secondary | ICD-10-CM | POA: Diagnosis not present

## 2019-12-16 DIAGNOSIS — J441 Chronic obstructive pulmonary disease with (acute) exacerbation: Secondary | ICD-10-CM | POA: Diagnosis not present

## 2019-12-16 DIAGNOSIS — R41841 Cognitive communication deficit: Secondary | ICD-10-CM | POA: Diagnosis not present

## 2019-12-16 DIAGNOSIS — R2689 Other abnormalities of gait and mobility: Secondary | ICD-10-CM | POA: Diagnosis not present

## 2019-12-16 DIAGNOSIS — M6281 Muscle weakness (generalized): Secondary | ICD-10-CM | POA: Diagnosis not present

## 2019-12-16 DIAGNOSIS — R1312 Dysphagia, oropharyngeal phase: Secondary | ICD-10-CM | POA: Diagnosis not present

## 2019-12-16 DIAGNOSIS — M6258 Muscle wasting and atrophy, not elsewhere classified, other site: Secondary | ICD-10-CM | POA: Diagnosis not present

## 2019-12-17 DIAGNOSIS — M6281 Muscle weakness (generalized): Secondary | ICD-10-CM | POA: Diagnosis not present

## 2019-12-17 DIAGNOSIS — M6258 Muscle wasting and atrophy, not elsewhere classified, other site: Secondary | ICD-10-CM | POA: Diagnosis not present

## 2019-12-17 DIAGNOSIS — R1312 Dysphagia, oropharyngeal phase: Secondary | ICD-10-CM | POA: Diagnosis not present

## 2019-12-17 DIAGNOSIS — R2689 Other abnormalities of gait and mobility: Secondary | ICD-10-CM | POA: Diagnosis not present

## 2019-12-17 DIAGNOSIS — R41841 Cognitive communication deficit: Secondary | ICD-10-CM | POA: Diagnosis not present

## 2019-12-17 DIAGNOSIS — J441 Chronic obstructive pulmonary disease with (acute) exacerbation: Secondary | ICD-10-CM | POA: Diagnosis not present

## 2019-12-18 DIAGNOSIS — Z9189 Other specified personal risk factors, not elsewhere classified: Secondary | ICD-10-CM | POA: Diagnosis not present

## 2019-12-18 DIAGNOSIS — M6281 Muscle weakness (generalized): Secondary | ICD-10-CM | POA: Diagnosis not present

## 2019-12-18 DIAGNOSIS — E118 Type 2 diabetes mellitus with unspecified complications: Secondary | ICD-10-CM | POA: Diagnosis not present

## 2019-12-18 DIAGNOSIS — I7389 Other specified peripheral vascular diseases: Secondary | ICD-10-CM | POA: Diagnosis not present

## 2019-12-18 DIAGNOSIS — J984 Other disorders of lung: Secondary | ICD-10-CM | POA: Diagnosis not present

## 2019-12-18 DIAGNOSIS — R2689 Other abnormalities of gait and mobility: Secondary | ICD-10-CM | POA: Diagnosis not present

## 2019-12-18 DIAGNOSIS — J441 Chronic obstructive pulmonary disease with (acute) exacerbation: Secondary | ICD-10-CM | POA: Diagnosis not present

## 2019-12-18 DIAGNOSIS — R41841 Cognitive communication deficit: Secondary | ICD-10-CM | POA: Diagnosis not present

## 2019-12-18 DIAGNOSIS — M6258 Muscle wasting and atrophy, not elsewhere classified, other site: Secondary | ICD-10-CM | POA: Diagnosis not present

## 2019-12-18 DIAGNOSIS — E038 Other specified hypothyroidism: Secondary | ICD-10-CM | POA: Diagnosis not present

## 2019-12-18 DIAGNOSIS — F0390 Unspecified dementia without behavioral disturbance: Secondary | ICD-10-CM | POA: Diagnosis not present

## 2019-12-18 DIAGNOSIS — Z789 Other specified health status: Secondary | ICD-10-CM | POA: Diagnosis not present

## 2019-12-18 DIAGNOSIS — E785 Hyperlipidemia, unspecified: Secondary | ICD-10-CM | POA: Diagnosis not present

## 2019-12-18 DIAGNOSIS — R1312 Dysphagia, oropharyngeal phase: Secondary | ICD-10-CM | POA: Diagnosis not present

## 2019-12-18 DIAGNOSIS — I2510 Atherosclerotic heart disease of native coronary artery without angina pectoris: Secondary | ICD-10-CM | POA: Diagnosis not present

## 2019-12-18 DIAGNOSIS — I4589 Other specified conduction disorders: Secondary | ICD-10-CM | POA: Diagnosis not present

## 2019-12-18 DIAGNOSIS — Z794 Long term (current) use of insulin: Secondary | ICD-10-CM | POA: Diagnosis not present

## 2019-12-18 DIAGNOSIS — F39 Unspecified mood [affective] disorder: Secondary | ICD-10-CM | POA: Diagnosis not present

## 2019-12-19 DIAGNOSIS — R1312 Dysphagia, oropharyngeal phase: Secondary | ICD-10-CM | POA: Diagnosis not present

## 2019-12-19 DIAGNOSIS — R2689 Other abnormalities of gait and mobility: Secondary | ICD-10-CM | POA: Diagnosis not present

## 2019-12-19 DIAGNOSIS — R41841 Cognitive communication deficit: Secondary | ICD-10-CM | POA: Diagnosis not present

## 2019-12-19 DIAGNOSIS — M6281 Muscle weakness (generalized): Secondary | ICD-10-CM | POA: Diagnosis not present

## 2019-12-19 DIAGNOSIS — M6258 Muscle wasting and atrophy, not elsewhere classified, other site: Secondary | ICD-10-CM | POA: Diagnosis not present

## 2019-12-19 DIAGNOSIS — J441 Chronic obstructive pulmonary disease with (acute) exacerbation: Secondary | ICD-10-CM | POA: Diagnosis not present

## 2019-12-22 DIAGNOSIS — M6258 Muscle wasting and atrophy, not elsewhere classified, other site: Secondary | ICD-10-CM | POA: Diagnosis not present

## 2019-12-22 DIAGNOSIS — F418 Other specified anxiety disorders: Secondary | ICD-10-CM | POA: Diagnosis not present

## 2019-12-22 DIAGNOSIS — M6281 Muscle weakness (generalized): Secondary | ICD-10-CM | POA: Diagnosis not present

## 2019-12-22 DIAGNOSIS — J441 Chronic obstructive pulmonary disease with (acute) exacerbation: Secondary | ICD-10-CM | POA: Diagnosis not present

## 2019-12-22 DIAGNOSIS — R1312 Dysphagia, oropharyngeal phase: Secondary | ICD-10-CM | POA: Diagnosis not present

## 2019-12-22 DIAGNOSIS — F330 Major depressive disorder, recurrent, mild: Secondary | ICD-10-CM | POA: Diagnosis not present

## 2019-12-22 DIAGNOSIS — R2689 Other abnormalities of gait and mobility: Secondary | ICD-10-CM | POA: Diagnosis not present

## 2019-12-22 DIAGNOSIS — R41841 Cognitive communication deficit: Secondary | ICD-10-CM | POA: Diagnosis not present

## 2019-12-22 DIAGNOSIS — G4700 Insomnia, unspecified: Secondary | ICD-10-CM | POA: Diagnosis not present

## 2019-12-23 DIAGNOSIS — R2689 Other abnormalities of gait and mobility: Secondary | ICD-10-CM | POA: Diagnosis not present

## 2019-12-23 DIAGNOSIS — I10 Essential (primary) hypertension: Secondary | ICD-10-CM | POA: Diagnosis not present

## 2019-12-23 DIAGNOSIS — M6258 Muscle wasting and atrophy, not elsewhere classified, other site: Secondary | ICD-10-CM | POA: Diagnosis not present

## 2019-12-23 DIAGNOSIS — E119 Type 2 diabetes mellitus without complications: Secondary | ICD-10-CM | POA: Diagnosis not present

## 2019-12-23 DIAGNOSIS — J441 Chronic obstructive pulmonary disease with (acute) exacerbation: Secondary | ICD-10-CM | POA: Diagnosis not present

## 2019-12-23 DIAGNOSIS — R634 Abnormal weight loss: Secondary | ICD-10-CM | POA: Diagnosis not present

## 2019-12-23 DIAGNOSIS — R41841 Cognitive communication deficit: Secondary | ICD-10-CM | POA: Diagnosis not present

## 2019-12-23 DIAGNOSIS — M6281 Muscle weakness (generalized): Secondary | ICD-10-CM | POA: Diagnosis not present

## 2019-12-23 DIAGNOSIS — R1312 Dysphagia, oropharyngeal phase: Secondary | ICD-10-CM | POA: Diagnosis not present

## 2019-12-23 DIAGNOSIS — J984 Other disorders of lung: Secondary | ICD-10-CM | POA: Diagnosis not present

## 2019-12-24 DIAGNOSIS — R41841 Cognitive communication deficit: Secondary | ICD-10-CM | POA: Diagnosis not present

## 2019-12-24 DIAGNOSIS — R7989 Other specified abnormal findings of blood chemistry: Secondary | ICD-10-CM | POA: Diagnosis not present

## 2019-12-24 DIAGNOSIS — M6281 Muscle weakness (generalized): Secondary | ICD-10-CM | POA: Diagnosis not present

## 2019-12-24 DIAGNOSIS — J441 Chronic obstructive pulmonary disease with (acute) exacerbation: Secondary | ICD-10-CM | POA: Diagnosis not present

## 2019-12-24 DIAGNOSIS — R946 Abnormal results of thyroid function studies: Secondary | ICD-10-CM | POA: Diagnosis not present

## 2019-12-24 DIAGNOSIS — R2689 Other abnormalities of gait and mobility: Secondary | ICD-10-CM | POA: Diagnosis not present

## 2019-12-24 DIAGNOSIS — R1312 Dysphagia, oropharyngeal phase: Secondary | ICD-10-CM | POA: Diagnosis not present

## 2019-12-24 DIAGNOSIS — M6258 Muscle wasting and atrophy, not elsewhere classified, other site: Secondary | ICD-10-CM | POA: Diagnosis not present

## 2019-12-24 DIAGNOSIS — D519 Vitamin B12 deficiency anemia, unspecified: Secondary | ICD-10-CM | POA: Diagnosis not present

## 2019-12-25 DIAGNOSIS — M6258 Muscle wasting and atrophy, not elsewhere classified, other site: Secondary | ICD-10-CM | POA: Diagnosis not present

## 2019-12-25 DIAGNOSIS — M6281 Muscle weakness (generalized): Secondary | ICD-10-CM | POA: Diagnosis not present

## 2019-12-25 DIAGNOSIS — R2689 Other abnormalities of gait and mobility: Secondary | ICD-10-CM | POA: Diagnosis not present

## 2019-12-25 DIAGNOSIS — R41841 Cognitive communication deficit: Secondary | ICD-10-CM | POA: Diagnosis not present

## 2019-12-25 DIAGNOSIS — J441 Chronic obstructive pulmonary disease with (acute) exacerbation: Secondary | ICD-10-CM | POA: Diagnosis not present

## 2019-12-25 DIAGNOSIS — R1312 Dysphagia, oropharyngeal phase: Secondary | ICD-10-CM | POA: Diagnosis not present

## 2019-12-26 DIAGNOSIS — M6281 Muscle weakness (generalized): Secondary | ICD-10-CM | POA: Diagnosis not present

## 2019-12-26 DIAGNOSIS — J441 Chronic obstructive pulmonary disease with (acute) exacerbation: Secondary | ICD-10-CM | POA: Diagnosis not present

## 2019-12-26 DIAGNOSIS — R2689 Other abnormalities of gait and mobility: Secondary | ICD-10-CM | POA: Diagnosis not present

## 2019-12-26 DIAGNOSIS — M6258 Muscle wasting and atrophy, not elsewhere classified, other site: Secondary | ICD-10-CM | POA: Diagnosis not present

## 2019-12-26 DIAGNOSIS — R1312 Dysphagia, oropharyngeal phase: Secondary | ICD-10-CM | POA: Diagnosis not present

## 2019-12-26 DIAGNOSIS — R41841 Cognitive communication deficit: Secondary | ICD-10-CM | POA: Diagnosis not present

## 2019-12-29 DIAGNOSIS — M6281 Muscle weakness (generalized): Secondary | ICD-10-CM | POA: Diagnosis not present

## 2019-12-29 DIAGNOSIS — J441 Chronic obstructive pulmonary disease with (acute) exacerbation: Secondary | ICD-10-CM | POA: Diagnosis not present

## 2019-12-29 DIAGNOSIS — R1312 Dysphagia, oropharyngeal phase: Secondary | ICD-10-CM | POA: Diagnosis not present

## 2019-12-29 DIAGNOSIS — R2689 Other abnormalities of gait and mobility: Secondary | ICD-10-CM | POA: Diagnosis not present

## 2019-12-29 DIAGNOSIS — R41841 Cognitive communication deficit: Secondary | ICD-10-CM | POA: Diagnosis not present

## 2019-12-29 DIAGNOSIS — M6258 Muscle wasting and atrophy, not elsewhere classified, other site: Secondary | ICD-10-CM | POA: Diagnosis not present

## 2019-12-30 DIAGNOSIS — R2689 Other abnormalities of gait and mobility: Secondary | ICD-10-CM | POA: Diagnosis not present

## 2019-12-30 DIAGNOSIS — M6258 Muscle wasting and atrophy, not elsewhere classified, other site: Secondary | ICD-10-CM | POA: Diagnosis not present

## 2019-12-30 DIAGNOSIS — M6281 Muscle weakness (generalized): Secondary | ICD-10-CM | POA: Diagnosis not present

## 2019-12-30 DIAGNOSIS — R1312 Dysphagia, oropharyngeal phase: Secondary | ICD-10-CM | POA: Diagnosis not present

## 2019-12-30 DIAGNOSIS — R41841 Cognitive communication deficit: Secondary | ICD-10-CM | POA: Diagnosis not present

## 2019-12-30 DIAGNOSIS — J441 Chronic obstructive pulmonary disease with (acute) exacerbation: Secondary | ICD-10-CM | POA: Diagnosis not present

## 2019-12-31 DIAGNOSIS — R41841 Cognitive communication deficit: Secondary | ICD-10-CM | POA: Diagnosis not present

## 2019-12-31 DIAGNOSIS — R2689 Other abnormalities of gait and mobility: Secondary | ICD-10-CM | POA: Diagnosis not present

## 2019-12-31 DIAGNOSIS — M6258 Muscle wasting and atrophy, not elsewhere classified, other site: Secondary | ICD-10-CM | POA: Diagnosis not present

## 2019-12-31 DIAGNOSIS — J441 Chronic obstructive pulmonary disease with (acute) exacerbation: Secondary | ICD-10-CM | POA: Diagnosis not present

## 2019-12-31 DIAGNOSIS — M6281 Muscle weakness (generalized): Secondary | ICD-10-CM | POA: Diagnosis not present

## 2019-12-31 DIAGNOSIS — R1312 Dysphagia, oropharyngeal phase: Secondary | ICD-10-CM | POA: Diagnosis not present

## 2020-01-02 DIAGNOSIS — R0602 Shortness of breath: Secondary | ICD-10-CM | POA: Diagnosis not present

## 2020-01-02 DIAGNOSIS — R2689 Other abnormalities of gait and mobility: Secondary | ICD-10-CM | POA: Diagnosis not present

## 2020-01-02 DIAGNOSIS — R1312 Dysphagia, oropharyngeal phase: Secondary | ICD-10-CM | POA: Diagnosis not present

## 2020-01-02 DIAGNOSIS — M6281 Muscle weakness (generalized): Secondary | ICD-10-CM | POA: Diagnosis not present

## 2020-01-02 DIAGNOSIS — M6258 Muscle wasting and atrophy, not elsewhere classified, other site: Secondary | ICD-10-CM | POA: Diagnosis not present

## 2020-01-02 DIAGNOSIS — R41841 Cognitive communication deficit: Secondary | ICD-10-CM | POA: Diagnosis not present

## 2020-01-02 DIAGNOSIS — J441 Chronic obstructive pulmonary disease with (acute) exacerbation: Secondary | ICD-10-CM | POA: Diagnosis not present

## 2020-01-29 ENCOUNTER — Inpatient Hospital Stay (HOSPITAL_COMMUNITY): Payer: Medicare Other

## 2020-01-29 ENCOUNTER — Inpatient Hospital Stay (HOSPITAL_COMMUNITY)
Admission: EM | Admit: 2020-01-29 | Discharge: 2020-03-02 | DRG: 871 | Disposition: E | Payer: Medicare Other | Source: Skilled Nursing Facility | Attending: Internal Medicine | Admitting: Internal Medicine

## 2020-01-29 ENCOUNTER — Emergency Department (HOSPITAL_COMMUNITY): Payer: Medicare Other

## 2020-01-29 DIAGNOSIS — J441 Chronic obstructive pulmonary disease with (acute) exacerbation: Secondary | ICD-10-CM | POA: Diagnosis present

## 2020-01-29 DIAGNOSIS — F0390 Unspecified dementia without behavioral disturbance: Secondary | ICD-10-CM | POA: Diagnosis present

## 2020-01-29 DIAGNOSIS — Z7952 Long term (current) use of systemic steroids: Secondary | ICD-10-CM

## 2020-01-29 DIAGNOSIS — R54 Age-related physical debility: Secondary | ICD-10-CM | POA: Diagnosis present

## 2020-01-29 DIAGNOSIS — J189 Pneumonia, unspecified organism: Secondary | ICD-10-CM

## 2020-01-29 DIAGNOSIS — Z9981 Dependence on supplemental oxygen: Secondary | ICD-10-CM

## 2020-01-29 DIAGNOSIS — Z794 Long term (current) use of insulin: Secondary | ICD-10-CM

## 2020-01-29 DIAGNOSIS — A4189 Other specified sepsis: Secondary | ICD-10-CM | POA: Diagnosis present

## 2020-01-29 DIAGNOSIS — Z87891 Personal history of nicotine dependence: Secondary | ICD-10-CM

## 2020-01-29 DIAGNOSIS — E1165 Type 2 diabetes mellitus with hyperglycemia: Secondary | ICD-10-CM

## 2020-01-29 DIAGNOSIS — Z955 Presence of coronary angioplasty implant and graft: Secondary | ICD-10-CM | POA: Diagnosis not present

## 2020-01-29 DIAGNOSIS — H9190 Unspecified hearing loss, unspecified ear: Secondary | ICD-10-CM | POA: Diagnosis present

## 2020-01-29 DIAGNOSIS — J9622 Acute and chronic respiratory failure with hypercapnia: Secondary | ICD-10-CM | POA: Diagnosis present

## 2020-01-29 DIAGNOSIS — J9602 Acute respiratory failure with hypercapnia: Secondary | ICD-10-CM | POA: Diagnosis not present

## 2020-01-29 DIAGNOSIS — E872 Acidosis: Secondary | ICD-10-CM | POA: Diagnosis present

## 2020-01-29 DIAGNOSIS — R0602 Shortness of breath: Secondary | ICD-10-CM

## 2020-01-29 DIAGNOSIS — J9620 Acute and chronic respiratory failure, unspecified whether with hypoxia or hypercapnia: Secondary | ICD-10-CM

## 2020-01-29 DIAGNOSIS — Z66 Do not resuscitate: Secondary | ICD-10-CM | POA: Diagnosis present

## 2020-01-29 DIAGNOSIS — G9341 Metabolic encephalopathy: Secondary | ICD-10-CM | POA: Diagnosis present

## 2020-01-29 DIAGNOSIS — M109 Gout, unspecified: Secondary | ICD-10-CM | POA: Diagnosis present

## 2020-01-29 DIAGNOSIS — IMO0002 Reserved for concepts with insufficient information to code with codable children: Secondary | ICD-10-CM | POA: Diagnosis present

## 2020-01-29 DIAGNOSIS — E039 Hypothyroidism, unspecified: Secondary | ICD-10-CM | POA: Diagnosis present

## 2020-01-29 DIAGNOSIS — Z86718 Personal history of other venous thrombosis and embolism: Secondary | ICD-10-CM

## 2020-01-29 DIAGNOSIS — E785 Hyperlipidemia, unspecified: Secondary | ICD-10-CM | POA: Diagnosis present

## 2020-01-29 DIAGNOSIS — I10 Essential (primary) hypertension: Secondary | ICD-10-CM | POA: Diagnosis present

## 2020-01-29 DIAGNOSIS — J440 Chronic obstructive pulmonary disease with acute lower respiratory infection: Secondary | ICD-10-CM | POA: Diagnosis present

## 2020-01-29 DIAGNOSIS — J9601 Acute respiratory failure with hypoxia: Secondary | ICD-10-CM | POA: Diagnosis present

## 2020-01-29 DIAGNOSIS — D649 Anemia, unspecified: Secondary | ICD-10-CM | POA: Diagnosis present

## 2020-01-29 DIAGNOSIS — Z6832 Body mass index (BMI) 32.0-32.9, adult: Secondary | ICD-10-CM

## 2020-01-29 DIAGNOSIS — J1282 Pneumonia due to coronavirus disease 2019: Secondary | ICD-10-CM | POA: Diagnosis present

## 2020-01-29 DIAGNOSIS — I2510 Atherosclerotic heart disease of native coronary artery without angina pectoris: Secondary | ICD-10-CM | POA: Diagnosis present

## 2020-01-29 DIAGNOSIS — U071 COVID-19: Secondary | ICD-10-CM

## 2020-01-29 DIAGNOSIS — Z7982 Long term (current) use of aspirin: Secondary | ICD-10-CM

## 2020-01-29 DIAGNOSIS — F05 Delirium due to known physiological condition: Secondary | ICD-10-CM | POA: Diagnosis not present

## 2020-01-29 DIAGNOSIS — J690 Pneumonitis due to inhalation of food and vomit: Secondary | ICD-10-CM | POA: Diagnosis not present

## 2020-01-29 DIAGNOSIS — E1151 Type 2 diabetes mellitus with diabetic peripheral angiopathy without gangrene: Secondary | ICD-10-CM | POA: Diagnosis present

## 2020-01-29 DIAGNOSIS — J9621 Acute and chronic respiratory failure with hypoxia: Secondary | ICD-10-CM | POA: Diagnosis present

## 2020-01-29 DIAGNOSIS — Z7189 Other specified counseling: Secondary | ICD-10-CM | POA: Diagnosis not present

## 2020-01-29 DIAGNOSIS — R0600 Dyspnea, unspecified: Secondary | ICD-10-CM | POA: Diagnosis not present

## 2020-01-29 DIAGNOSIS — E669 Obesity, unspecified: Secondary | ICD-10-CM | POA: Diagnosis present

## 2020-01-29 DIAGNOSIS — Z79899 Other long term (current) drug therapy: Secondary | ICD-10-CM

## 2020-01-29 DIAGNOSIS — Z79890 Hormone replacement therapy (postmenopausal): Secondary | ICD-10-CM

## 2020-01-29 DIAGNOSIS — Z515 Encounter for palliative care: Secondary | ICD-10-CM

## 2020-01-29 DIAGNOSIS — I959 Hypotension, unspecified: Secondary | ICD-10-CM | POA: Diagnosis present

## 2020-01-29 LAB — BASIC METABOLIC PANEL
Anion gap: 9 (ref 5–15)
BUN: 13 mg/dL (ref 8–23)
CO2: 33 mmol/L — ABNORMAL HIGH (ref 22–32)
Calcium: 8.8 mg/dL — ABNORMAL LOW (ref 8.9–10.3)
Chloride: 95 mmol/L — ABNORMAL LOW (ref 98–111)
Creatinine, Ser: 0.84 mg/dL (ref 0.61–1.24)
GFR, Estimated: >60 mL/min (ref >60)
Glucose, Bld: 187 mg/dL — ABNORMAL HIGH (ref 70–99)
Potassium: 4.7 mmol/L (ref 3.5–5.1)
Sodium: 137 mmol/L (ref 135–145)

## 2020-01-29 LAB — I-STAT ARTERIAL BLOOD GAS, ED
Acid-Base Excess: 10 mmol/L — ABNORMAL HIGH (ref 0.0–2.0)
Acid-Base Excess: 7 mmol/L — ABNORMAL HIGH (ref 0.0–2.0)
Bicarbonate: 39.5 mmol/L — ABNORMAL HIGH (ref 20.0–28.0)
Bicarbonate: 35.8 mmol/L — ABNORMAL HIGH (ref 20.0–28.0)
Calcium, Ion: 1.3 mmol/L (ref 1.15–1.40)
Calcium, Ion: 1.15 mmol/L (ref 1.15–1.40)
HCT: 40 % (ref 39.0–52.0)
HCT: 39 % (ref 39.0–52.0)
Hemoglobin: 13.6 g/dL (ref 13.0–17.0)
Hemoglobin: 13.3 g/dL (ref 13.0–17.0)
O2 Saturation: 98 %
O2 Saturation: 84 %
Patient temperature: 99.9
Potassium: 4.2 mmol/L (ref 3.5–5.1)
Potassium: 4.4 mmol/L (ref 3.5–5.1)
Sodium: 136 mmol/L (ref 135–145)
Sodium: 135 mmol/L (ref 135–145)
TCO2: 42 mmol/L — ABNORMAL HIGH (ref 22–32)
TCO2: 38 mmol/L — ABNORMAL HIGH (ref 22–32)
pCO2 arterial: 79.5 mmHg (ref 32.0–48.0)
pCO2 arterial: 69.8 mmHg (ref 32.0–48.0)
pH, Arterial: 7.308 — ABNORMAL LOW (ref 7.350–7.450)
pH, Arterial: 7.318 — ABNORMAL LOW (ref 7.350–7.450)
pO2, Arterial: 134 mmHg — ABNORMAL HIGH (ref 83.0–108.0)
pO2, Arterial: 55 mmHg — ABNORMAL LOW (ref 83.0–108.0)

## 2020-01-29 LAB — CBC WITH DIFFERENTIAL/PLATELET
Abs Immature Granulocytes: 0.14 10*3/uL — ABNORMAL HIGH (ref 0.00–0.07)
Basophils Absolute: 0 10*3/uL (ref 0.0–0.1)
Basophils Relative: 0 %
Eosinophils Absolute: 0.1 10*3/uL (ref 0.0–0.5)
Eosinophils Relative: 0 %
HCT: 43.2 % (ref 39.0–52.0)
Hemoglobin: 12.8 g/dL — ABNORMAL LOW (ref 13.0–17.0)
Immature Granulocytes: 1 %
Lymphocytes Relative: 3 %
Lymphs Abs: 0.7 10*3/uL (ref 0.7–4.0)
MCH: 29 pg (ref 26.0–34.0)
MCHC: 29.6 g/dL — ABNORMAL LOW (ref 30.0–36.0)
MCV: 98 fL (ref 80.0–100.0)
Monocytes Absolute: 1.3 10*3/uL — ABNORMAL HIGH (ref 0.1–1.0)
Monocytes Relative: 6 %
Neutro Abs: 19.3 10*3/uL — ABNORMAL HIGH (ref 1.7–7.7)
Neutrophils Relative %: 90 %
Platelets: 202 10*3/uL (ref 150–400)
RBC: 4.41 MIL/uL (ref 4.22–5.81)
RDW: 16.5 % — ABNORMAL HIGH (ref 11.5–15.5)
WBC: 21.4 10*3/uL — ABNORMAL HIGH (ref 4.0–10.5)
nRBC: 0 % (ref 0.0–0.2)

## 2020-01-29 LAB — HEPATIC FUNCTION PANEL
ALT: 28 U/L (ref 0–44)
AST: 31 U/L (ref 15–41)
Albumin: 2.6 g/dL — ABNORMAL LOW (ref 3.5–5.0)
Alkaline Phosphatase: 90 U/L (ref 38–126)
Bilirubin, Direct: 0.2 mg/dL (ref 0.0–0.2)
Indirect Bilirubin: 0.4 mg/dL (ref 0.3–0.9)
Total Bilirubin: 0.6 mg/dL (ref 0.3–1.2)
Total Protein: 6.1 g/dL — ABNORMAL LOW (ref 6.5–8.1)

## 2020-01-29 LAB — CBG MONITORING, ED
Glucose-Capillary: 194 mg/dL — ABNORMAL HIGH (ref 70–99)
Glucose-Capillary: 178 mg/dL — ABNORMAL HIGH (ref 70–99)
Glucose-Capillary: 206 mg/dL — ABNORMAL HIGH (ref 70–99)

## 2020-01-29 LAB — LACTIC ACID, PLASMA
Lactic Acid, Venous: 1.1 mmol/L (ref 0.5–1.9)
Lactic Acid, Venous: 1.6 mmol/L (ref 0.5–1.9)
Lactic Acid, Venous: 2.2 mmol/L (ref 0.5–1.9)

## 2020-01-29 LAB — SARS CORONAVIRUS 2 (TAT 6-24 HRS): SARS Coronavirus 2: POSITIVE — AB

## 2020-01-29 LAB — GLUCOSE, CAPILLARY: Glucose-Capillary: 157 mg/dL — ABNORMAL HIGH (ref 70–99)

## 2020-01-29 LAB — TSH: TSH: 2.7 u[IU]/mL (ref 0.350–4.500)

## 2020-01-29 LAB — D-DIMER, QUANTITATIVE: D-Dimer, Quant: 0.95 ug/mL-FEU — ABNORMAL HIGH (ref 0.00–0.50)

## 2020-01-29 MED ORDER — METHYLPREDNISOLONE SODIUM SUCC 125 MG IJ SOLR
0.5000 mg/kg | Freq: Two times a day (BID) | INTRAMUSCULAR | Status: DC
  Administered 2020-01-29 – 2020-01-30 (×3): 48.125 mg via INTRAVENOUS
  Filled 2020-01-29 (×3): qty 2

## 2020-01-29 MED ORDER — IPRATROPIUM-ALBUTEROL 20-100 MCG/ACT IN AERS
1.0000 | INHALATION_SPRAY | Freq: Four times a day (QID) | RESPIRATORY_TRACT | Status: DC | PRN
  Administered 2020-01-29: 1 via RESPIRATORY_TRACT
  Filled 2020-01-29: qty 4

## 2020-01-29 MED ORDER — SODIUM CHLORIDE 0.9 % IV SOLN
100.0000 mg | Freq: Every day | INTRAVENOUS | Status: AC
  Administered 2020-01-30 – 2020-02-02 (×4): 100 mg via INTRAVENOUS
  Filled 2020-01-29 (×5): qty 20

## 2020-01-29 MED ORDER — SODIUM CHLORIDE 0.9 % IV SOLN: INTRAVENOUS | Status: DC

## 2020-01-29 MED ORDER — SODIUM CHLORIDE 0.9 % IV BOLUS
1000.0000 mL | Freq: Once | INTRAVENOUS | Status: AC
  Administered 2020-01-29: 1000 mL via INTRAVENOUS

## 2020-01-29 MED ORDER — LACTATED RINGERS IV BOLUS
1000.0000 mL | Freq: Once | INTRAVENOUS | Status: AC
  Administered 2020-01-29: 1000 mL via INTRAVENOUS

## 2020-01-29 MED ORDER — ZINC SULFATE 220 (50 ZN) MG PO CAPS: 220.0000 mg | ORAL_CAPSULE | Freq: Every day | ORAL | Status: DC

## 2020-01-29 MED ORDER — SODIUM CHLORIDE 0.9 % IV SOLN
2.0000 g | INTRAVENOUS | Status: DC
  Administered 2020-01-29 – 2020-02-01 (×4): 2 g via INTRAVENOUS
  Filled 2020-01-29 (×2): qty 2
  Filled 2020-01-29: qty 20
  Filled 2020-01-29: qty 2

## 2020-01-29 MED ORDER — PREDNISONE 20 MG PO TABS: 50.0000 mg | ORAL_TABLET | Freq: Every day | ORAL | Status: DC

## 2020-01-29 MED ORDER — SODIUM CHLORIDE 0.9 % IV SOLN
500.0000 mg | INTRAVENOUS | Status: DC
  Administered 2020-01-29 – 2020-02-01 (×4): 500 mg via INTRAVENOUS
  Filled 2020-01-29 (×4): qty 500

## 2020-01-29 MED ORDER — ASCORBIC ACID 500 MG PO TABS: 500.0000 mg | ORAL_TABLET | Freq: Every day | ORAL | Status: DC

## 2020-01-29 MED ORDER — ENOXAPARIN SODIUM 40 MG/0.4ML ~~LOC~~ SOLN
40.0000 mg | SUBCUTANEOUS | Status: DC
  Administered 2020-01-29 – 2020-02-05 (×8): 40 mg via SUBCUTANEOUS
  Filled 2020-01-29 (×8): qty 0.4

## 2020-01-29 MED ORDER — SODIUM CHLORIDE 0.9 % IV SOLN
200.0000 mg | Freq: Once | INTRAVENOUS | Status: AC
  Administered 2020-01-29: 200 mg via INTRAVENOUS
  Filled 2020-01-29: qty 200

## 2020-01-29 MED ORDER — IOHEXOL 350 MG/ML SOLN
75.0000 mL | Freq: Once | INTRAVENOUS | Status: AC | PRN
  Administered 2020-01-29: 75 mL via INTRAVENOUS

## 2020-01-29 MED ORDER — INSULIN ASPART 100 UNIT/ML ~~LOC~~ SOLN: 0.0000 [IU] | Freq: Three times a day (TID) | SUBCUTANEOUS | Status: DC

## 2020-01-29 MED ORDER — SODIUM CHLORIDE 0.9 % IV SOLN: 2.0000 g | Freq: Three times a day (TID) | INTRAVENOUS | Status: DC

## 2020-01-29 MED ORDER — UMECLIDINIUM-VILANTEROL 62.5-25 MCG/INH IN AEPB
1.0000 | INHALATION_SPRAY | Freq: Every day | RESPIRATORY_TRACT | Status: DC
  Administered 2020-01-31 – 2020-02-05 (×6): 1 via RESPIRATORY_TRACT
  Filled 2020-01-29 (×3): qty 14

## 2020-01-29 MED ORDER — INSULIN ASPART 100 UNIT/ML ~~LOC~~ SOLN
0.0000 [IU] | SUBCUTANEOUS | Status: DC
  Administered 2020-01-29 (×2): 2 [IU] via SUBCUTANEOUS
  Administered 2020-01-29: 3 [IU] via SUBCUTANEOUS
  Administered 2020-01-30 (×2): 2 [IU] via SUBCUTANEOUS
  Administered 2020-01-30: 1 [IU] via SUBCUTANEOUS

## 2020-01-29 MED ORDER — IPRATROPIUM-ALBUTEROL 20-100 MCG/ACT IN AERS
1.0000 | INHALATION_SPRAY | Freq: Four times a day (QID) | RESPIRATORY_TRACT | Status: DC
  Administered 2020-01-29 – 2020-01-31 (×7): 1 via RESPIRATORY_TRACT
  Filled 2020-01-29: qty 4

## 2020-01-29 NOTE — Progress Notes (Signed)
Notified provider of need to order lactic acid. ° °

## 2020-01-29 NOTE — Progress Notes (Signed)
Notified provider of need to administer  2871 cc fluid bolus per sepsis protocol, bedside RN made aware

## 2020-01-29 NOTE — ED Notes (Signed)
Back from CT

## 2020-01-29 NOTE — ED Notes (Signed)
Report given to floor Donalda Ewings, RN.

## 2020-01-29 NOTE — ED Notes (Signed)
Pt's son, Jamicheal called back, update given to son.

## 2020-01-29 NOTE — ED Notes (Signed)
Pt cleaned of urinary incontinence. Perineal care provided. New brief applied. Linens changed.

## 2020-01-29 NOTE — ED Notes (Signed)
Attempted to call son Kodiak Rollyson III on number listed as alternative contact but there was no answer. Unable to update son as requested

## 2020-01-29 NOTE — Progress Notes (Signed)
Notified bedside nurse of need to draw lactic acid and blood cultures.  

## 2020-01-29 NOTE — ED Notes (Signed)
Pt transported to CT ?

## 2020-01-29 NOTE — Progress Notes (Signed)
Asking MD to DC code sepsis, code sepsis requires lactic acid to be drawn

## 2020-01-29 NOTE — Consult Note (Addendum)
NAME:  Ernest Genova., MRN:  [id number], DOB:  03-06-1931, LOS: 0 ADMISSION DATE:  01/23/2020, CONSULTATION DATE: 01/20/2020 REFERRING MD:  Dr. Tamala Julian, CHIEF COMPLAINT:  Shortness of breath   Brief History:  87 year old male with past medical history significant for CAD s/p PCI, PAD, hypertension, hyperlipidemia, COPD on 2 L home O2 and follows with Dr. Lamonte Sakai, type 2 diabetes and DVT who presented from rehab with a drop in oxygen concentrations.  Found to have Covid-19, right sided pneumonia and hypercarbia.  PCCM consulted in the setting of Covid and possible BiPAP needs  History of Present Illness:  Ernest Clark is an 87 year old male with past medical history significant for CAD s/p PCI, PAD, hypertension, hyperlipidemia, COPD on 2 L home O2 and follows with Dr. Lamonte Sakai, type 2 diabetes and DVT who presented from rehab with a drop in oxygen concentrations.  Per records, he tested positive for Covid-19 approximately 1/22 and has not received any treatment.  His oxygen saturations down trended to the mid 80%'s and so EMS was called.    In the ED, oxygen sats were 86% so he was placed on nasal cannula.  Labs significant for a white blood cell count of 21.4k and chest x-ray with right mid and lower lung consolidation.  ABG 1.3/79 point 5/134/30 9.5.  He was given steroids, azithromycin, cefepime, 2 L IV fluids and remdesivir.  Given his hypercarbia, PCCM was consulted for possible BiPAP in the setting of Covid-19  Patient is extremely hard of hearing, history obtained after reviewing chart and facility records.  Apparently he has been in this rehab facility at [facility] in 07/2019, reviewed last office visit with Dr. Lamonte Sakai, DNR noted, required 2 L oxygen on exertion at baseline  Past Medical History:   has a past medical history of COPD (chronic obstructive pulmonary disease) ([place]), Coronary heart disease, Diabetes mellitus, DVT (deep venous thrombosis) ([place]) (2011), Gout, Hyperlipidemia,  Hypertension, and Peripheral arterial disease ([place]).   Significant Hospital Events:  1/27 Admit  Consults:  PCCM  Procedures:    Significant Diagnostic Tests:   1/27 CXR>>Right mid and lower lung zone consolidation, in keeping with acute lobar pneumonia in the appropriate clinical setting  1/27 CT chest>> no PE,Bilateral airspace opacity slightly worse on the right than the left similar to that seen on prior chest x-ray consistent with the given clinical history.  Micro Data:  1/27 Covid-19>>pending 1/27 blood cultures x2>> 1/27 sputum culture>>  Antimicrobials:  Cefepime 1/27- Azithromycin 1/27-  Interim History / Subjective:  As above  Objective   Blood pressure (!) 116/41, pulse 88, temperature 99.9 F (37.7 C), temperature source Oral, resp. rate (!) 23, weight 95.7 kg, SpO2 98 %.       No intake or output data in the 24 hours ending 01/05/2020 1147 Filed Weights   01/19/2020 1100  Weight: 95.7 kg    Examination: General: Elderly man, nasal cannula, no distress HENT: Very hard of hearing, mild pallor, no icterus Lungs: Decreased breath sounds on right, clear on left, no rhonchi Cardiovascular: S1-S2 regular, no murmur Abdomen: Soft, nontender Extremities: No edema Neuro: Awake, alert, interactive was able to tell me his name, address and date of birth, nonfocal grossly   Chest x-ray and CT angiogram were independently reviewed  Resolved Hospital Problem list     Assessment & Plan:  Acute hypoxic and hypercarbic respiratory failure Multifocal pneumonia -aspiration in the differential diagnosis COPD on home oxygen 2 L on exertion DNR Mild dementia  -  In the setting of Covid infection, will hold off on BiPAP until he is in a negative pressure room.  He is currently in no distress , awake and interactive -so does not need bicarb unless he gets hypersomnolent or lethargic.  I was able to cut down his oxygen to 2 L while at bedside Would admit to  progressive care in case BiPAP is required -Swallow evaluation before advancing diet -ct Anoro  Transient hypotension/sepsis -check lactate for completion Blood pressure has responded to IV fluids   COVID-19 positive test (U07.1, COVID-19) with Acute Pneumonia (J12.89, Other viral pneumonia) (If respiratory failure or sepsis present, add as separate assessment)   COVID-19 positive test (U07.1, COVID-19) with Acute Pneumonia (J12.89, Other viral pneumonia) (If respiratory failure or sepsis present, add as separate assessment)  Likely superimposed HAP  -He is vaccinated we will states expect a good outcome -Remdesivir and steroids per primary service -We will also cover with Unasyn for aspiration   PCCM will be available as needed  Goals of Care:  Per [facility], son updated by them Code Status: DNR  Labs   CBC: Recent Labs  Lab 01/14/2020 0625 01/03/2020 0845  WBC 21.4*  --   NEUTROABS 19.3*  --   HGB 12.8* 13.6  HCT 43.2 40.0  MCV 98.0  --   PLT 202  --     Basic Metabolic Panel: Recent Labs  Lab 01/27/2020 0445 01/17/2020 0845  NA 137 136  K 4.7 4.2  CL 95*  --   CO2 33*  --   GLUCOSE 187*  --   BUN 13  --   CREATININE 0.84  --   CALCIUM 8.8*  --    GFR: Estimated Creatinine Clearance: 70.8 mL/min (by C-G formula based on SCr of 0.84 mg/dL). Recent Labs  Lab 01/14/2020 0625 01/31/2020 0952  WBC 21.4*  --   LATICACIDVEN  --  1.1    Liver Function Tests: No results for input(s): AST, ALT, ALKPHOS, BILITOT, PROT, ALBUMIN in the last 168 hours. No results for input(s): LIPASE, AMYLASE in the last 168 hours. No results for input(s): AMMONIA in the last 168 hours.  ABG    Component Value Date/Time   PHART 7.308 (L) 01/09/2020 0845   PCO2ART 79.5 (HH) 01/24/2020 0845   PO2ART 134 (H) 01/08/2020 0845   HCO3 39.5 (H) 01/08/2020 0845   TCO2 42 (H) 01/30/2020 0845   O2SAT 98.0 01/19/2020 0845     Coagulation Profile: No results for input(s): INR, PROTIME in the  last 168 hours.  Cardiac Enzymes: No results for input(s): CKTOTAL, CKMB, CKMBINDEX, TROPONINI in the last 168 hours.  HbA1C: Hgb A1c MFr Bld  Date/Time Value Ref Range Status  07/14/2019 01:36 PM 7.8 (H) 4.8 - 5.6 % Final    Comment:    (NOTE) Pre diabetes:          5.7%-6.4%  Diabetes:              >6.4%  Glycemic control for   <7.0% adults with diabetes   07/05/2016 03:17 PM 7.6 (H) 4.8 - 5.6 % Final    Comment:    (NOTE)         Pre-diabetes: 5.7 - 6.4         Diabetes: >6.4         Glycemic control for adults with diabetes: <7.0     CBG: No results for input(s): GLUCAP in the last 168 hours.  Review of Systems:   Very  hard of hearing so limited ROS. Denies dyspnea No chest pain, palpitations, sputum production or hemoptysis. No pedal edema, orthopnea or PND No nausea vomiting/diarrhea. No dysphagia  Past Medical History:  He,  has a past medical history of COPD (chronic obstructive pulmonary disease) ([place]), Coronary heart disease, Diabetes mellitus, DVT (deep venous thrombosis) ([place]) (2011), Gout, Hyperlipidemia, Hypertension, and Peripheral arterial disease ([place]).   Surgical History:   Past Surgical History:  Procedure Laterality Date  . CARDIAC CATHETERIZATION  1994   w/ PTCA   . COLONOSCOPY     w/ polyp resection  . TONSILLECTOMY  as a child     Social History:   reports that he quit smoking about 19 years ago. His smoking use included cigarettes. He has a 120.00 pack-year smoking history. He has quit using smokeless tobacco.  His smokeless tobacco use included chew. He reports that he does not drink alcohol and does not use drugs.   Family History:  His family history includes Diabetes in his brother, brother, father, and mother.   Allergies Allergies  Allergen Reactions  . Actos [Pioglitazone Hydrochloride] Other (See Comments)    RIGHT LEG SWELLING   . Angiotensin Receptor Blockers Cough  . Furosemide Other (See Comments)     Hyponatremia.  Pt is taking furosemide now  . Vicodin Hp [Hydrocodone-Acetaminophen] Other (See Comments)    Unknown reaction  . Metformin And Related Other (See Comments)    Reaction unknown     Home Medications  Prior to Admission medications   Medication Sig Start Date End Date Taking? Authorizing Provider  Acetaminophen 500 MG capsule Take 1,000 mg by mouth every 4 (four) hours as needed for pain.   Yes [provider]  albuterol (ACCUNEB) 0.63 MG/3ML nebulizer solution Take 1 ampule by nebulization every 6 (six) hours as needed for wheezing.   Yes [provider]  albuterol (VENTOLIN HFA) 108 (90 Base) MCG/ACT inhaler    Yes [provider]  allopurinol (ZYLOPRIM) 100 MG tablet Take 100 mg by mouth daily.  10/13/14  Yes [provider]  ANORO ELLIPTA 62.5-25 MCG/INH AEPB USE 1 INHALATION ORALLY ONE TIME DAILY Patient taking differently: Inhale 1 puff into the lungs daily. 07/24/18  Yes Collene Gobble, MD  Ascorbic Acid (VITAMIN C WITH ROSE HIPS) 500 MG tablet Take 1,000 mg by mouth daily.   Yes [provider]  aspirin 81 MG tablet Take 81 mg by mouth daily.   Yes [provider]  cholecalciferol (VITAMIN D3) 25 MCG (1000 UNIT) tablet Take 1,000 Units by mouth daily.   Yes [provider]  dexamethasone (DECADRON) 6 MG tablet Take 6 mg by mouth daily. 01/27/20  Yes [provider]  docusate sodium (COLACE) 100 MG capsule Take 200 mg by mouth 2 (two) times daily.    Yes [provider]  DULoxetine HCl 40 MG CPEP Take 40 mg by mouth daily.   Yes [provider]  Ferrous Sulfate (IRON) 325 (65 Fe) MG TABS Take 325 mg by mouth every other day.   Yes [provider]  FLOVENT DISKUS 100 MCG/BLIST AEPB Inhale 1 puff into the lungs 2 (two) times daily. 12/31/19  Yes [provider]  furosemide (LASIX) 20 MG tablet Take 10 mg by mouth daily.   Yes [provider]  guaiFENesin (MUCINEX) 600 MG 12  hr tablet Take 600 mg by mouth 2 (two) times daily.   Yes [provider]  Ipratropium-Albuterol (COMBIVENT RESPIMAT) 20-100 MCG/ACT AERS  respimat Inhale 1 puff into the lungs in the morning and at bedtime.   Yes [provider]  ipratropium-albuterol (DUONEB) 0.5-2.5 (3) MG/3ML SOLN Take 3 mLs by nebulization every 6 (six) hours as needed. 07/22/19  Yes Shelly Coss, MD  levothyroxine (SYNTHROID) 50 MCG tablet Take 50 mcg by mouth daily before breakfast. 05/08/18  Yes [provider]  Melatonin 5 MG CAPS Take 5 mg by mouth at bedtime.   Yes [provider]  mirtazapine (REMERON) 7.5 MG tablet Take 7.5 mg by mouth at bedtime. 01/11/20  Yes [provider]  montelukast (SINGULAIR) 10 MG tablet Take 10 mg by mouth at bedtime.   Yes [provider]  omeprazole (PRILOSEC) 20 MG capsule Take 20 mg by mouth 2 (two) times daily.   Yes [provider]  polyethylene glycol (MIRALAX / GLYCOLAX) packet Take 17 g by mouth daily as needed for moderate constipation. 07/08/16  Yes Rai, Ripudeep K, MD  Potassium Chloride ER 20 MEQ TBCR Take 20 mEq by mouth daily.   Yes [provider]  rosuvastatin (CRESTOR) 40 MG tablet Take 40 mg by mouth daily.   Yes [provider]  sodium chloride (OCEAN) 0.65 % SOLN nasal spray Place 2 sprays into both nostrils every 4 (four) hours.   Yes [provider]  traZODone (DESYREL) 50 MG tablet Take 50 mg by mouth at bedtime. 01/06/20  Yes [provider]  TRESIBA FLEXTOUCH 100 UNIT/ML SOPN FlexTouch Pen Inject 10 Units into the skin in the morning. 11/10/17  Yes [provider]  zinc sulfate 220 (50 Zn) MG capsule Take 220 mg by mouth daily.   Yes [provider]  BD PEN NEEDLE NANO U/F 32G X 4 MM MISC  09/21/17   [provider]  oxyCODONE (OXY IR/ROXICODONE) 5 MG immediate release tablet Take 1 tablet (5 mg total) by mouth every 4 (four) hours as needed for  moderate pain. Patient not taking: No sig reported 07/22/19   Shelly Coss, MD     Kara Mead MD. FCCP. [facility] See Amion for pager  If no response to pager , please call [phone] until 7 pm After 7:00 pm call Elink  [phone]   2020-02-23

## 2020-01-29 NOTE — Progress Notes (Signed)
New patient arrived via stretcher from ED was admitted to [facility].

## 2020-01-29 NOTE — ED Notes (Signed)
Date and time results received: 01/27/2020   Test: Lactic Acid Critical Value: 2.2  Name of Provider Notified: Tamala Julian MD

## 2020-01-29 NOTE — Progress Notes (Signed)
Per PCCM, no bipap needed at this time. RRT will continue to monitor pt.

## 2020-01-29 NOTE — ED Provider Notes (Signed)
[facility] EMERGENCY DEPARTMENT Provider Note   CSN: [id number] Arrival date & time:      History Chief Complaint  Patient presents with  . Covid Positive    Ernest Clark Ernest Clark. is a 87 y.o. male.  The history is provided by the EMS personnel. The history is limited by the condition of the patient (Dementia).  He has history of hypertension, diabetes, hyperlipidemia, COPD, peripheral vascular disease, coronary artery disease and was sent from skilled nursing facility where he is recovering from Covid infection.  He is noted to have had hypoxia with oxygen saturation at 86% despite oxygen at 5 L/min.  Patient is not able to give any coherent history.  Past Medical History:  Diagnosis Date  . COPD (chronic obstructive pulmonary disease) ([place])   . Coronary heart disease    remote PCI  . Diabetes mellitus   . DVT (deep venous thrombosis) ([place]) 2011  . Gout   . Hyperlipidemia   . Hypertension   . Peripheral arterial disease [facility])     Patient Active Problem List   Diagnosis Date Noted  . Palliative care encounter   . Elevated troponin   . Acute hypoxemic respiratory failure ([place])   . Hav (hallux abducto valgus), left 04/15/2019  . Hav (hallux abducto valgus), right 04/15/2019  . Nail, injury by, initial encounter 10/15/2018  . Pain due to onychomycosis of toenails of both feet 07/09/2018  . Type 2 diabetes mellitus with vascular disease ([place]) 07/09/2018  . Allergic rhinitis 03/08/2017  . Solitary pulmonary nodule 03/08/2017  . Hyponatremia 07/04/2016  . Type 2 diabetes mellitus without complication, with long-term current use of insulin ([place]) 07/04/2016  . Multiple rib fractures 07/04/2016  . Peripheral arterial disease ([place]) 09/25/2013  . Coronary heart disease 09/25/2013  . Essential hypertension 09/25/2013  . Hyperlipidemia 09/25/2013  . COPD (chronic obstructive pulmonary disease) ([place]) 12/09/2010    Past Surgical History:  Procedure Laterality  Date  . CARDIAC CATHETERIZATION  1994   w/ PTCA   . COLONOSCOPY     w/ polyp resection  . TONSILLECTOMY  as a child       Family History  Problem Relation Age of Onset  . Diabetes Father   . Diabetes Mother   . Diabetes Brother   . Diabetes Brother     Social History   Tobacco Use  . Smoking status: Former Smoker    Packs/day: 2.00    Years: 60.00    Pack years: 120.00    Types: Cigarettes    Quit date: 01/02/2001    Years since quitting: 19.0  . Smokeless tobacco: Former Systems developer    Types: Chew  Substance Use Topics  . Alcohol use: No  . Drug use: No    Home Medications Prior to Admission medications   Medication Sig Start Date End Date Taking? Authorizing Provider  Acetaminophen 500 MG capsule Take 1,000 mg by mouth every 4 (four) hours as needed for pain.    [provider]  albuterol (PROAIR HFA) 108 (90 BASE) MCG/ACT inhaler Inhale 2 puffs into the lungs every 6 (six) hours as needed for wheezing or shortness of breath.     [provider]  allopurinol (ZYLOPRIM) 100 MG tablet Take 100 mg by mouth daily.  10/13/14   [provider]  ANORO ELLIPTA 62.5-25 MCG/INH AEPB USE 1 INHALATION ORALLY ONE TIME DAILY Patient taking differently: Inhale 1 puff into the lungs daily.  07/24/18   Collene Gobble, MD  aspirin 81 MG tablet Take 81 mg by mouth daily.     [provider]  BD PEN NEEDLE NANO U/F 32G X 4 MM MISC  09/21/17   [provider]  cetirizine (ZYRTEC) 10 MG tablet Take 10 mg by mouth daily as needed for allergies.    [provider]  docusate sodium (STOOL SOFTENER) 100 MG capsule Take 200 mg by mouth 2 (two) times daily.     [provider]  Dulaglutide (TRULICITY) [id number] [id number] SOPN Inject into the skin.    [provider]  DULoxetine (CYMBALTA) 30 MG capsule Take 30 mg by mouth daily.    [provider]  Ferrous Sulfate (IRON) 325 (65 Fe) MG TABS Take 325 mg by mouth daily.     [provider]  furosemide (LASIX) 20 MG tablet Take 20 mg by mouth daily.    [provider]  HUMALOG KWIKPEN 100 UNIT/ML KwikPen Inject 8 Units into the skin daily with lunch.  09/25/17   [provider]  Ipratropium-Albuterol (COMBIVENT RESPIMAT) 20-100 MCG/ACT AERS respimat Inhale 1 puff into the lungs in the morning and at bedtime.    [provider]  ipratropium-albuterol (DUONEB) 0.5-2.5 (3) MG/3ML SOLN Take 3 mLs by nebulization every 6 (six) hours as needed. 07/22/19   Shelly Coss, MD  levothyroxine (SYNTHROID) 75 MCG tablet Take 75 mcg by mouth daily before breakfast.  05/08/18   [provider]  montelukast (SINGULAIR) 10 MG tablet Take 10 mg by mouth at bedtime.    [provider]  omeprazole (PRILOSEC) 20 MG capsule Take 20 mg by mouth 2 (two) times daily.    [provider]  oxyCODONE (OXY IR/ROXICODONE) 5 MG immediate release tablet Take 1 tablet (5 mg total) by mouth every 4 (four) hours as needed for moderate pain. 07/22/19   Shelly Coss, MD  polyethylene glycol (MIRALAX / GLYCOLAX) packet Take 17 g by mouth daily as needed for moderate constipation. 07/08/16   Rai, Ripudeep K, MD  potassium chloride (KLOR-CON 10) 10 MEQ tablet Take 10 mEq by mouth daily.    [provider]  predniSONE (DELTASONE) 10 MG tablet Take 1 tablet (10 mg total) by mouth daily with breakfast. Continue indefinately 07/27/19   Shelly Coss, MD  rosuvastatin (CRESTOR) 40 MG tablet Take 40 mg by mouth daily.      [provider]  TRESIBA FLEXTOUCH 100 UNIT/ML SOPN FlexTouch Pen Inject 26 Units into the skin in the morning.  11/10/17   [provider]    Allergies    Actos [pioglitazone hydrochloride] and Metformin and related  Review of Systems   Review of Systems  Unable to perform ROS: Dementia    Physical Exam Updated Vital Signs BP (!) 128/50   Pulse (!) 113   Temp 99.4 F (37.4 C) (Oral)   Resp (!) 27    SpO2 86%   Physical Exam Vitals and nursing note reviewed.   87 year old male, resting comfortably and in no acute distress. Vital signs are significant for elevated heart rate and respiratory rate. Oxygen saturation is 86%, which is hypoxic. Head is normocephalic and atraumatic. PERRLA, EOMI. Oropharynx is clear. Neck is nontender and supple without adenopathy or JVD. Back is nontender and there is no CVA tenderness. Lungs are clear without rales, wheezes, or rhonchi. Chest is nontender. Heart has regular rate and rhythm without murmur. Abdomen is soft, flat, nontender without masses or hepatosplenomegaly and peristalsis is normoactive. Extremities have no  cyanosis or edema, full range of motion is present. Skin is warm and dry without rash. Neurologic: Awake and alert but will not answer questions, cranial nerves are intact, moves all extremities equally.  ED Results / Procedures / Treatments   Labs (all labs ordered are listed, but only abnormal results are displayed) Labs Reviewed  BASIC METABOLIC PANEL - Abnormal; Notable for the following components:      Result Value   Chloride 95 (*)    CO2 33 (*)    Glucose, Bld 187 (*)    Calcium 8.8 (*)    All other components within normal limits  D-DIMER, QUANTITATIVE (NOT AT [facility]) - Abnormal; Notable for the following components:   D-Dimer, Quant 0.95 (*)    All other components within normal limits  CBC WITH DIFFERENTIAL/PLATELET - Abnormal; Notable for the following components:   WBC 21.4 (*)    Hemoglobin 12.8 (*)    MCHC 29.6 (*)    RDW 16.5 (*)    Neutro Abs 19.3 (*)    Monocytes Absolute 1.3 (*)    Abs Immature Granulocytes 0.14 (*)    All other components within normal limits  SARS CORONAVIRUS 2 (TAT 6-24 HRS)  CBC WITH DIFFERENTIAL/PLATELET   Radiology DG Chest Port 1 View  Result Date: 01/31/2020 CLINICAL DATA:  COVID pneumonia, dyspnea EXAM: PORTABLE CHEST 1 VIEW COMPARISON:  07/20/2019 FINDINGS: The lungs are  symmetrically inflated. Elevation of the left hemidiaphragm is again noted. There is right mid and lower lung zone consolidation, new since prior examination, possibly infectious in the acute setting. Note that aspiration or pulmonary hemorrhage could appear similarly. Mild left basilar atelectasis or infiltrate noted. No pneumothorax or pleural effusion. Mild cardiomegaly is unchanged. IMPRESSION: Right mid and lower lung zone consolidation, in keeping with acute lobar pneumonia in the appropriate clinical setting. Probable left basilar atelectasis with associated stable elevation of the left hemidiaphragm. Electronically Signed   By: Fidela Salisbury MD   On: 01/21/2020 05:06    Procedures Procedures   Medications Ordered in ED Medications  ceFEPIme (MAXIPIME) 2 g in sodium chloride 0.9 % 100 mL IVPB (has no administration in time range)    ED Course  I have reviewed the triage vital signs and the nursing notes.  Pertinent labs & imaging results that were available during my care of the patient were reviewed by me and considered in my medical decision making (see chart for details).  MDM Rules/Calculators/A&P History of recent Covid infection, now hypoxic.  Will check chest x-ray and screening labs.  Old records are reviewed confirming history of cognitive decline, past hospitalization for COPD.  Chest x-ray shows right middle and lower lobe pneumonia.  WBC is markedly elevated at 21.4 with a left shift.  Mild anemia is present and actually improved over baseline.  D-dimer is elevated and will send for CT angiogram but suspect that respiratory failure is secondary to the pneumonia.  He is started on antibiotics for healthcare associated pneumonia.  Case is discussed with Dr. Tamala Julian of [facility], who agrees to admit the patient.  Final Clinical Impression(s) / ED Diagnoses Final diagnoses:  HCAP (healthcare-associated pneumonia)  Acute and chr resp failure, unsp w hypoxia or  hypercapnia (HCC)  Normochromic normocytic anemia    Rx / DC Orders ED Discharge Orders    None       Delora Fuel, MD 10/18/49 2239

## 2020-01-29 NOTE — ED Notes (Signed)
Pt appears to be more confused on approach, when asked ; pt voices no complaints. This nurse notified EDP Smith.

## 2020-01-29 NOTE — H&P (Signed)
History and Physical    Ernest Clark. MWN:[id number] DOB: Feb 17, 1931 DOA: 02-22-2020  Referring MD/NP/PA: Davonna Belling, MD PCP: Hulan Fess, MD  Patient coming from:  Via EMS  Chief Complaint: Drop in O2 saturations  I have personally briefly reviewed patient's old medical records in [place]   HPI: Ernest Clark. is a 87 y.o. male with medical history significant of HTN, HLD, COPD, CAD s/p PCI, PAD, DM type II, DVT, hypothyroidism, and dementia presents after due to reports and drop in O2 sat respirations.  History is obtained from review of records.  Apparently the patient tested positive for Covid-19 1 week ago and had been at this facility. Patient's O2 sats were reported to have lowered from the low 90s to the mid 80s prompting staff to call EMS. Apparently patient appear to be in no acute respiratory distress and was very talkative, but confused which was a llitte more than normal.  After talks with the patient's son apparently he tested positive for COVID-19 on 1/22.  To the son's knowledge she had not been given any treatments for COVID-19 diagnosis.   ED Course: Upon admission into the emergency department patient was seen to be afebrile, pulse 10 9-113, respirations 27-31, blood pressure 108/57-128/50, and O2 saturations documented to be as low as 86% with improvement on nasal cannula oxygen.  Labs significant for WBC 21.4, CO2 33, glucose 187, and D-dimer 0.95.  Chest x-ray revealed right mid and lower lung consolidations consistent with a right lobar pneumonia, and left basilar atelectasis with stable elevation of the left hemidiaphragm.  CT angiogram of the chest had been ordered.  Patient has been started empirically on cefepime.  Sepsis protocol had not been initiated.  Review of Systems  HENT: Positive for hearing loss.   Respiratory: Positive for cough and shortness of breath.     Past Medical History:  Diagnosis Date  . COPD (chronic obstructive  pulmonary disease) ([place])   . Coronary heart disease    remote PCI  . Diabetes mellitus   . DVT (deep venous thrombosis) ([place]) 2011  . Gout   . Hyperlipidemia   . Hypertension   . Peripheral arterial disease [facility])     Past Surgical History:  Procedure Laterality Date  . CARDIAC CATHETERIZATION  1994   w/ PTCA   . COLONOSCOPY     w/ polyp resection  . TONSILLECTOMY  as a child     reports that he quit smoking about 19 years ago. His smoking use included cigarettes. He has a 120.00 pack-year smoking history. He has quit using smokeless tobacco.  His smokeless tobacco use included chew. He reports that he does not drink alcohol and does not use drugs.  Allergies  Allergen Reactions  . Actos [Pioglitazone Hydrochloride] Other (See Comments)    RIGHT LEG SWELLING   . Metformin And Related Other (See Comments)    Reaction unknown    Family History  Problem Relation Age of Onset  . Diabetes Father   . Diabetes Mother   . Diabetes Brother   . Diabetes Brother     Prior to Admission medications   Medication Sig Start Date End Date Taking? Authorizing Provider  Acetaminophen 500 MG capsule Take 1,000 mg by mouth every 4 (four) hours as needed for pain.    [provider]  albuterol (PROAIR HFA) 108 (90 BASE) MCG/ACT inhaler Inhale 2 puffs into the lungs every 6 (six) hours as needed for wheezing or shortness  of breath.     [provider]  allopurinol (ZYLOPRIM) 100 MG tablet Take 100 mg by mouth daily.  10/13/14   [provider]  ANORO ELLIPTA 62.5-25 MCG/INH AEPB USE 1 INHALATION ORALLY ONE TIME DAILY Patient taking differently: Inhale 1 puff into the lungs daily.  07/24/18   Collene Gobble, MD  aspirin 81 MG tablet Take 81 mg by mouth daily.     [provider]  BD PEN NEEDLE NANO U/F 32G X 4 MM MISC  09/21/17   [provider]  cetirizine (ZYRTEC) 10 MG tablet Take 10 mg by mouth daily as needed for allergies.    [provider]  docusate sodium (STOOL SOFTENER) 100 MG capsule Take 200 mg by mouth 2 (two) times daily.     [provider]  Dulaglutide (TRULICITY) [id number] SOPN Inject into the skin.    [provider]  DULoxetine (CYMBALTA) 30 MG capsule Take 30 mg by mouth daily.    [provider]  Ferrous Sulfate (IRON) 325 (65 Fe) MG TABS Take 325 mg by mouth daily.    [provider]  furosemide (LASIX) 20 MG tablet Take 20 mg by mouth daily.    [provider]  HUMALOG KWIKPEN 100 UNIT/ML KwikPen Inject 8 Units into the skin daily with lunch.  09/25/17   [provider]  Ipratropium-Albuterol (COMBIVENT RESPIMAT) 20-100 MCG/ACT AERS respimat Inhale 1 puff into the lungs in the morning and at bedtime.    [provider]  ipratropium-albuterol (DUONEB) 0.5-2.5 (3) MG/3ML SOLN Take 3 mLs by nebulization every 6 (six) hours as needed. 07/22/19   Shelly Coss, MD  levothyroxine (SYNTHROID) 75 MCG tablet Take 75 mcg by mouth daily before breakfast.  05/08/18   [provider]  montelukast (SINGULAIR) 10 MG tablet Take 10 mg by mouth at bedtime.    [provider]  omeprazole (PRILOSEC) 20 MG capsule Take 20 mg by mouth 2 (two) times daily.    [provider]  oxyCODONE (OXY IR/ROXICODONE) 5 MG immediate release tablet Take 1 tablet (5 mg total) by mouth every 4 (four) hours as needed for moderate pain. 07/22/19   Shelly Coss, MD  polyethylene glycol (MIRALAX / GLYCOLAX) packet Take 17 g by mouth daily as needed for moderate constipation. 07/08/16   Rai, Ripudeep K, MD  potassium chloride (KLOR-CON 10) 10 MEQ tablet Take 10 mEq by mouth daily.    [provider]  predniSONE (DELTASONE) 10 MG tablet Take 1 tablet (10 mg total) by mouth daily with breakfast. Continue indefinately 07/27/19   Shelly Coss, MD  rosuvastatin (CRESTOR) 40 MG tablet Take 40 mg by mouth daily.      [provider]  TRESIBA  FLEXTOUCH 100 UNIT/ML SOPN FlexTouch Pen Inject 26 Units into the skin in the morning.  11/10/17   [provider]    Physical Exam:  Constitutional: Elderly male who is lethargic, but appears to be in some respiratory distress Vitals:   01/14/2020 0241 01/30/2020 0245 01/19/2020 0445  BP:  (!) 128/50 (!) 108/57  Pulse:  (!) 113 (!) 109  Resp:  (!) 27 (!) 31  Temp: 99.4 F (37.4 C)    TempSrc: Oral    SpO2:  100% 99%   Eyes: PERRL, lids and conjunctivae normal ENMT: Mucous membranes are moist. Posterior pharynx clear of any exudate or lesions. Hard of Hearing. Neck: normal, supple, no masses, no thyromegaly.  No JVD appreciated Respiratory: Tachypneic  with expiratory wheezes appreciated.  Patient currently on Cardiovascular: Irregular no murmurs / rubs / gallops. No extremity edema. 2+ pedal pulses. No carotid bruits.  Abdomen: no tenderness, no masses palpated. No hepatosplenomegaly. Bowel sounds positive.  Musculoskeletal: no clubbing / cyanosis. No joint deformity upper and lower extremities. Good ROM, no contractures. Normal muscle tone.  Skin: no rashes, lesions, ulcers. No induration Neurologic: CN 2-12 grossly intact.  Patient appears to be able to move all extremity Psychiatric: Lethargic, unable to assess.   Labs on Admission: I have personally reviewed following labs and imaging studies  CBC: Recent Labs  Lab 02/09/2020 0625  WBC 21.4*  NEUTROABS 19.3*  HGB 12.8*  HCT 43.2  MCV 98.0  PLT 202   Basic Metabolic Panel: Recent Labs  Lab 02/09/20 0445  NA 137  K 4.7  CL 95*  CO2 33*  GLUCOSE 187*  BUN 13  CREATININE 0.84  CALCIUM 8.8*   GFR: CrCl cannot be calculated (Unknown ideal weight.). Liver Function Tests: No results for input(s): AST, ALT, ALKPHOS, BILITOT, PROT, ALBUMIN in the last 168 hours. No results for input(s): LIPASE, AMYLASE in the last 168 hours. No results for input(s): AMMONIA in the last 168 hours. Coagulation Profile: No results  for input(s): INR, PROTIME in the last 168 hours. Cardiac Enzymes: No results for input(s): CKTOTAL, CKMB, CKMBINDEX, TROPONINI in the last 168 hours. BNP (last 3 results) No results for input(s): PROBNP in the last 8760 hours. HbA1C: No results for input(s): HGBA1C in the last 72 hours. CBG: No results for input(s): GLUCAP in the last 168 hours. Lipid Profile: No results for input(s): CHOL, HDL, LDLCALC, TRIG, CHOLHDL, LDLDIRECT in the last 72 hours. Thyroid Function Tests: No results for input(s): TSH, T4TOTAL, FREET4, T3FREE, THYROIDAB in the last 72 hours. Anemia Panel: No results for input(s): VITAMINB12, FOLATE, FERRITIN, TIBC, IRON, RETICCTPCT in the last 72 hours. Urine analysis:    Component Value Date/Time   COLORURINE YELLOW 07/13/2019 2200   APPEARANCEUR CLEAR 07/13/2019 2200   LABSPEC 1.020 07/13/2019 2200   PHURINE 6.0 07/13/2019 2200   GLUCOSEU NEGATIVE 07/13/2019 2200   HGBUR SMALL (A) 07/13/2019 2200   BILIRUBINUR NEGATIVE 07/13/2019 2200   KETONESUR NEGATIVE 07/13/2019 2200   PROTEINUR NEGATIVE 07/13/2019 2200   NITRITE NEGATIVE 07/13/2019 2200   LEUKOCYTESUR NEGATIVE 07/13/2019 2200   Sepsis Labs: No results found for this or any previous visit (from the past 240 hour(s)).   Radiological Exams on Admission: DG Chest Port 1 View  Result Date: 02-09-20 CLINICAL DATA:  COVID pneumonia, dyspnea EXAM: PORTABLE CHEST 1 VIEW COMPARISON:  07/20/2019 FINDINGS: The lungs are symmetrically inflated. Elevation of the left hemidiaphragm is again noted. There is right mid and lower lung zone consolidation, new since prior examination, possibly infectious in the acute setting. Note that aspiration or pulmonary hemorrhage could appear similarly. Mild left basilar atelectasis or infiltrate noted. No pneumothorax or pleural effusion. Mild cardiomegaly is unchanged. IMPRESSION: Right mid and lower lung zone consolidation, in keeping with acute lobar pneumonia in the appropriate  clinical setting. Probable left basilar atelectasis with associated stable elevation of the left hemidiaphragm. Electronically Signed   By: Helyn Numbers MD   On: 02/09/20 05:06    EKG: Independently reviewed.  Sinus tachycardia 101 bpm  Assessment/Plan Sepsis secondary to pneumonia: Patient presented with tachycardia and tachypnea with WBC 21.4.  Chest x-ray showing a right lobar pneumonia.  Patient was noted to be very lethargic on physical exam given concern for hypercapnia.  O2 saturations currently maintained on 3 to 5 L nasal cannula oxygen. -Admit to a medical telemetry bed -Continuous pulse oximetry with nasal cannula oxygen maintain O2 saturation greater than 90% -Aspiration precautions with elevation of the head of the bed -Check blood and sputum cultures  -Check lactic acid -Bolus 2 L of lactated Ringer's IV fluid -Check ABG(pH 7.308, PCO2 79.5, PO2 134) -Check MRSA screening -Change antibiotics to Rocephin and azithromycin -N.p.o. consult speech therapy to evaluate for safety swallow  Acute respiratory failure with hypoxia and hypercapnia COPD exacerbation: On admission patient noted to be wheezing.  Initial ABG as seen above showing respiratory acidosis with elevated CO2 on 3 L.  Patient had required previous admission with similar symptoms requiring  placed on back. -BiPAP ordered and respiratory therapy notified -PCCM consulted due to likely need of ICU admission  Hypotension: Acute.  Initially blood pressures were maintained, but seemed to intermittently drop with blood pressures reported as low as 75/50.  Sepsis protocol had not been initially started on admission. -Lactated Ringer's 2 L IV -Reassess and give additional 863 ml of IV fluids if needed to complete  COVID-19 infection: Patient was found to be COVID-19 positive on 1/15, but does not appear to have been given any treatment . -COVID-19 order set utilized -Airborne and contact for cath -Remdesivir per  pharmacy -Solu-Medrol IV -Albuterol inhaler  Diabetes mellitus type 2: On admission glucose 187.  Patient's last hemoglobin A1c was 7.8 in 07/2019. -Hypoglycemic protocol -Check hemoglobin A1c in a.m. -CBGs before every meal with moderate SSI  Hypothyroidism: Last TSH was 4.424 on 07/14/2019. -Add on TSH -Restart home levothyroxine when able   Dementia: Patient son reports that he was told that he had dementia, but reports that it was mild.  DNR: Present on arrival.  DVT prophylaxis: Lovenox Code Status: DNR Family Communication: Son updated over the phone Disposition Plan: Likely discharge back to nursing facility once medically stable Consults called: None Admission status: Inpatient status requiring more than 2 midnight stay  Norval Morton MD [facility]   If 7PM-7AM, please contact night-coverage   01/27/2020, 7:48 AM

## 2020-01-29 NOTE — Progress Notes (Signed)
Following for code sepsis 

## 2020-01-29 NOTE — Progress Notes (Signed)
Notified bedside nurse of need to draw lactic acid.  

## 2020-01-29 NOTE — ED Notes (Signed)
hospitalist at bedside

## 2020-01-29 NOTE — ED Notes (Signed)
Pt's son - Maddix Heinz updated on pt's current status.

## 2020-01-29 NOTE — ED Triage Notes (Signed)
Pt bib ems from [facility] where he is recovering from covid. Facility staff noted pt to be at spo2 at 86% on 5L. Pt currently at 99% on 4L on arrival. VSS.

## 2020-01-30 DIAGNOSIS — J9601 Acute respiratory failure with hypoxia: Secondary | ICD-10-CM | POA: Diagnosis not present

## 2020-01-30 DIAGNOSIS — E039 Hypothyroidism, unspecified: Secondary | ICD-10-CM | POA: Diagnosis not present

## 2020-01-30 DIAGNOSIS — E1165 Type 2 diabetes mellitus with hyperglycemia: Secondary | ICD-10-CM | POA: Diagnosis not present

## 2020-01-30 DIAGNOSIS — U071 COVID-19: Secondary | ICD-10-CM | POA: Diagnosis not present

## 2020-01-30 LAB — CBC WITH DIFFERENTIAL/PLATELET
Abs Immature Granulocytes: 0.05 10*3/uL (ref 0.00–0.07)
Basophils Absolute: 0 10*3/uL (ref 0.0–0.1)
Basophils Relative: 0 %
Eosinophils Absolute: 0 10*3/uL (ref 0.0–0.5)
Eosinophils Relative: 0 %
HCT: 39.1 % (ref 39.0–52.0)
Hemoglobin: 12 g/dL — ABNORMAL LOW (ref 13.0–17.0)
Immature Granulocytes: 1 %
Lymphocytes Relative: 6 %
Lymphs Abs: 0.5 10*3/uL — ABNORMAL LOW (ref 0.7–4.0)
MCH: 29.9 pg (ref 26.0–34.0)
MCHC: 30.7 g/dL (ref 30.0–36.0)
MCV: 97.5 fL (ref 80.0–100.0)
Monocytes Absolute: 0.2 10*3/uL (ref 0.1–1.0)
Monocytes Relative: 2 %
Neutro Abs: 8.5 10*3/uL — ABNORMAL HIGH (ref 1.7–7.7)
Neutrophils Relative %: 91 %
Platelets: 182 10*3/uL (ref 150–400)
RBC: 4.01 MIL/uL — ABNORMAL LOW (ref 4.22–5.81)
RDW: 16.5 % — ABNORMAL HIGH (ref 11.5–15.5)
WBC: 9.3 10*3/uL (ref 4.0–10.5)
nRBC: 0 % (ref 0.0–0.2)

## 2020-01-30 LAB — GLUCOSE, CAPILLARY
Glucose-Capillary: 170 mg/dL — ABNORMAL HIGH (ref 70–99)
Glucose-Capillary: 191 mg/dL — ABNORMAL HIGH (ref 70–99)
Glucose-Capillary: 174 mg/dL — ABNORMAL HIGH (ref 70–99)
Glucose-Capillary: 184 mg/dL — ABNORMAL HIGH (ref 70–99)
Glucose-Capillary: 156 mg/dL — ABNORMAL HIGH (ref 70–99)
Glucose-Capillary: 157 mg/dL — ABNORMAL HIGH (ref 70–99)

## 2020-01-30 LAB — HEMOGLOBIN A1C
Hgb A1c MFr Bld: 7 % — ABNORMAL HIGH (ref 4.8–5.6)
Mean Plasma Glucose: 154.2 mg/dL

## 2020-01-30 MED ORDER — METHYLPREDNISOLONE SODIUM SUCC 40 MG IJ SOLR
40.0000 mg | Freq: Two times a day (BID) | INTRAMUSCULAR | Status: DC
  Administered 2020-01-30: 40 mg via INTRAVENOUS
  Filled 2020-01-30: qty 1

## 2020-01-30 MED ORDER — DULOXETINE HCL 20 MG PO CPEP
40.0000 mg | ORAL_CAPSULE | Freq: Every day | ORAL | Status: DC
  Administered 2020-01-30 – 2020-02-04 (×6): 40 mg via ORAL
  Filled 2020-01-30 (×8): qty 2

## 2020-01-30 MED ORDER — ASPIRIN EC 81 MG PO TBEC
81.0000 mg | DELAYED_RELEASE_TABLET | Freq: Every day | ORAL | Status: DC
  Administered 2020-01-30 – 2020-02-04 (×6): 81 mg via ORAL
  Filled 2020-01-30 (×6): qty 1

## 2020-01-30 MED ORDER — LEVOTHYROXINE SODIUM 50 MCG PO TABS
50.0000 ug | ORAL_TABLET | Freq: Every day | ORAL | Status: DC
  Administered 2020-01-31 – 2020-02-05 (×4): 50 ug via ORAL
  Filled 2020-01-30 (×6): qty 1

## 2020-01-30 MED ORDER — PREDNISONE 20 MG PO TABS: 50.0000 mg | ORAL_TABLET | Freq: Every day | ORAL | Status: DC

## 2020-01-30 MED ORDER — PANTOPRAZOLE SODIUM 40 MG PO TBEC
40.0000 mg | DELAYED_RELEASE_TABLET | Freq: Every day | ORAL | Status: DC
  Administered 2020-01-30 – 2020-02-04 (×5): 40 mg via ORAL
  Filled 2020-01-30 (×4): qty 1

## 2020-01-30 MED ORDER — MIRTAZAPINE 15 MG PO TABS
7.5000 mg | ORAL_TABLET | Freq: Every day | ORAL | Status: DC
  Administered 2020-01-30 – 2020-02-01 (×3): 7.5 mg via ORAL
  Filled 2020-01-30 (×3): qty 1

## 2020-01-30 MED ORDER — ROSUVASTATIN CALCIUM 20 MG PO TABS
40.0000 mg | ORAL_TABLET | Freq: Every day | ORAL | Status: DC
  Administered 2020-01-30 – 2020-02-04 (×6): 40 mg via ORAL
  Filled 2020-01-30 (×6): qty 2

## 2020-01-30 NOTE — Progress Notes (Signed)
PROGRESS NOTE                                                                                                                                                                                                             Patient Demographics:    Ernest Clark, is a 87 y.o. male, DOB - [birth year], SD:[id number]  Outpatient Primary MD for the patient is Hulan Fess, MD   Admit date - 01/28/2020   LOS - 1  Chief Complaint  Patient presents with  . Covid Positive       Brief Narrative: Patient is a 87 y.o. male with PMHx of CAD s/p PCI, PAD, HTN, HLD, COPD on home O2 2 L/min, DVT-presented to the hospital with acute on chronic hypoxic and hypercarbic respiratory failure due to HCAP/bacterial and COVID-19 infection.  COVID-19 vaccinated status: Vaccinated  Significant Events: 1/27>> Admit to [facility] for acute hypoxic/hypercarbic respiratory failure-HCAP/COVID-19 infection  Significant studies: 1/27>> CTA chest: No PE, bilateral airspace opacity-right>> left   COVID-19 medications: Steroids: 1/27>> Remdesivir: 1/27>>  Antibiotics: Rocephin: 1/27>> Zithromax: 1/27>>  Microbiology data: 1/27 >>blood culture: No growth  Procedures: None  Consults: PCCM  DVT prophylaxis: enoxaparin (LOVENOX) injection 40 mg Start: 01/14/2020 0900    Subjective:    Bishop [place] today had significant delirium this morning-was ripping out IV line and pulling on his telemetry leads.  However with redirection by nursing staff-he calmed down.  He is only on 2 L of oxygen.   Assessment  & Plan :   Acute on chronic hypoxemic and hypercarbic respiratory failure: Suspect this is mostly from bacterial/HCAP-likely aspiration pneumonia-could have some element of COVID-19 infection.  Seems to have improved compared to on admission-back on usual home regimen of 2 L of oxygen.  Continue empiric antimicrobial therapy/steroids and Remdesivir.   Await culture data and narrow antibiotic spectrum accordingly.  Per nursing staff-passed swallow screen-starting dysphagia 3 diet-await further recommendations from SLP.  Fever: afebrile O2 requirements:  SpO2: 97 % O2 Flow Rate (L/min): 2 L/min   COVID-19 Labs: Recent Labs    01/16/2020 0445  DDIMER 0.95*       Component Value Date/Time   BNP 30.8 07/07/2016 1344    No results for input(s): PROCALCITON in the last 168 hours.  Lab Results  Component Value Date   [place] (A) 01/06/2020  [place] NEGATIVE 07/22/2019   [place] NEGATIVE 07/13/2019     Prone/Incentive Spirometry: encouraged patient to lie prone for 3-4 hours at a time for a total of 16 hours a day, and to encourage incentive spirometry use 3-4/hour.  Sepsis: Secondary to PNA-sepsis physiology improved-awaiting culture data-see above regarding IV antibiotics.  Delirium/acute metabolic encephalopathy superimposed on dementia: Suspect delirium/metabolic encephalopathy due to hypoxemia/pneumonia.  Continue to expect some amount of delirium during this hospital stay.  Maintain delirium precautions.  Since pulling on telemetry leads-suspect okay to stop telemetry monitoring as he will not put them on anyway.  Will resume Cymbalta and Remeron.  Will need to be cautious with sedating agents given history of COPD and hypercarbia on presentation.  CAD/PAD: All stable-continue aspirin/Crestor  COPD: Not in flare-continue bronchodilators  Hypothyroidism: Continue levothyroxine  DM-2: CBG stable-continue SSI  Recent Labs    01/30/20 0407 01/30/20 0751 01/30/20 1119  GLUCAP 170* 191* 174*   Obesity: Estimated body mass index is 32.08 kg/m as calculated from the following:   Height as of 09/17/19: 5\' 8"  (1.727 m).   Weight as of this encounter: 95.7 kg.    ABG:    Component Value Date/Time   PHART 7.318 (L) 01/27/2020 1439   PCO2ART 69.8 (HH) 01/03/2020 1439   PO2ART 55 (L) 01/19/2020 1439    HCO3 35.8 (H) 01/13/2020 1439   TCO2 38 (H) 01/08/2020 1439   O2SAT 84.0 01/17/2020 1439    Vent Settings: N/A    Condition - Stable  Family Communication  :  [phone] left voicemail on 1/28  Code Status :  DNR  Diet :  Diet Order            DIET DYS 3 Room service appropriate? Yes; Fluid consistency: Thin  Diet effective now                  Disposition Plan  :   Status is: Inpatient  Remains inpatient appropriate because:Inpatient level of care appropriate due to severity of illness   Dispo: The patient is from: SNF              Anticipated d/c is to: SNF              Anticipated d/c date is: > 3 days              Patient currently is not medically stable to d/c.   Difficult to place patient No         Barriers to discharge: Hypoxia requiring O2 supplementation/complete 5 days of IV Remdesivir  Antimicorbials  :    Anti-infectives (From admission, onward)   Start     Dose/Rate Route Frequency Ordered Stop   01/30/20 1000  remdesivir 100 mg in sodium chloride 0.9 % 100 mL IVPB       "Followed by" Linked Group Details   100 mg 200 mL/hr over 30 Minutes Intravenous Daily 01/23/2020 1105 02/03/20 0959   01/24/2020 1200  remdesivir 200 mg in sodium chloride 0.9% 250 mL IVPB       "Followed by" Linked Group Details   200 mg 580 mL/hr over 30 Minutes Intravenous Once 01/17/2020 1105 01/25/2020 1405   01/07/2020 0830  cefTRIAXone (ROCEPHIN) 2 g in sodium chloride 0.9 % 100 mL IVPB        2 g 200 mL/hr over 30 Minutes Intravenous Every 24 hours 01/19/2020 0805 02/03/20 0829   01/12/2020 0815  azithromycin (ZITHROMAX) 500 mg in sodium chloride 0.9 %  250 mL IVPB        500 mg 250 mL/hr over 60 Minutes Intravenous Every 24 hours 01/18/2020 0805 02/03/20 0814   01/18/2020 0700  ceFEPIme (MAXIPIME) 2 g in sodium chloride 0.9 % 100 mL IVPB  Status:  Discontinued        2 g 200 mL/hr over 30 Minutes Intravenous Every 8 hours 01/17/2020 0656 01/06/2020 0805      Inpatient  Medications  Scheduled Meds: . enoxaparin (LOVENOX) injection  40 mg Subcutaneous Q24H  . insulin aspart  0-9 Units Subcutaneous Q4H  . Ipratropium-Albuterol  1 puff Inhalation Q6H  . methylPREDNISolone (SOLU-MEDROL) injection  0.5 mg/kg Intravenous Q12H   Followed by  . [START ON 02/01/2020] predniSONE  50 mg Oral Daily  . umeclidinium-vilanterol  1 puff Inhalation Daily   Continuous Infusions: . sodium chloride 75 mL/hr at 01/30/20 0824  . azithromycin 500 mg (01/30/20 0952)  . cefTRIAXone (ROCEPHIN)  IV 2 g (01/30/20 0828)  . remdesivir 100 mg in NS 100 mL 100 mg (01/30/20 1130)   PRN Meds:.Ipratropium-Albuterol   Time Spent in minutes  25  See all Orders from today for further details   Oren Binet M.D on 01/30/2020 at 12:09 PM  To page go to [email or web address] - use universal password  [facility] -  Office  [phone]    Objective:   Vitals:   01/30/20 0412 01/30/20 0833 01/30/20 0835 01/30/20 1122  BP: (!) 118/58 132/64  (!) 146/74  Pulse: 92 88  93  Resp: 18 18  20   Temp: 97.8 F (36.6 C) 97.9 F (36.6 C)  97.6 F (36.4 C)  TempSrc: Oral Oral  Oral  SpO2: 98% 98% 96% 97%  Weight:        Wt Readings from Last 3 Encounters:  02/02/2020 95.7 kg  09/17/19 95.7 kg  07/22/19 103 kg     Intake/Output Summary (Last 24 hours) at 01/30/2020 1209 Last data filed at 01/30/2020 9563 Gross per 24 hour  Intake 592.21 ml  Output 625 ml  Net -32.79 ml     Physical Exam Gen Exam:Alert awake-not in any distress HEENT:atraumatic, normocephalic Chest: B/L clear to auscultation anteriorly CVS:S1S2 regular Abdomen:soft non tender, non distended Extremities:no edema Neurology: Non focal Skin: no rash   Data Review:    CBC Recent Labs  Lab 01/31/2020 0625 02/02/2020 0845 01/25/2020 1439 01/30/20 0516  WBC 21.4*  --   --  9.3  HGB 12.8* 13.6 13.3 12.0*  HCT 43.2 40.0 39.0 39.1  PLT 202  --   --  182  MCV 98.0  --   --  97.5  MCH 29.0  --   --  29.9   MCHC 29.6*  --   --  30.7  RDW 16.5*  --   --  16.5*  LYMPHSABS 0.7  --   --  0.5*  MONOABS 1.3*  --   --  0.2  EOSABS 0.1  --   --  0.0  BASOSABS 0.0  --   --  0.0    Chemistries  Recent Labs  Lab 01/25/2020 0445 02/02/2020 0845 01/09/2020 1258 01/23/2020 1439  NA 137 136  --  135  K 4.7 4.2  --  4.4  CL 95*  --   --   --   CO2 33*  --   --   --   GLUCOSE 187*  --   --   --   BUN 13  --   --   --  CREATININE 0.84  --   --   --   CALCIUM 8.8*  --   --   --   AST  --   --  31  --   ALT  --   --  28  --   ALKPHOS  --   --  90  --   BILITOT  --   --  0.6  --    ------------------------------------------------------------------------------------------------------------------ No results for input(s): CHOL, HDL, LDLCALC, TRIG, CHOLHDL, LDLDIRECT in the last 72 hours.  Lab Results  Component Value Date   HGBA1C 7.0 (H) 01/30/2020   ------------------------------------------------------------------------------------------------------------------ Recent Labs    01/18/2020 1259  TSH 2.700   ------------------------------------------------------------------------------------------------------------------ No results for input(s): VITAMINB12, FOLATE, FERRITIN, TIBC, IRON, RETICCTPCT in the last 72 hours.  Coagulation profile No results for input(s): INR, PROTIME in the last 168 hours.  Recent Labs    01/05/2020 0445  DDIMER 0.95*    Cardiac Enzymes No results for input(s): CKMB, TROPONINI, MYOGLOBIN in the last 168 hours.  Invalid input(s): CK ------------------------------------------------------------------------------------------------------------------    Component Value Date/Time   BNP 30.8 07/07/2016 1344    Micro Results Recent Results (from the past 240 hour(s))  Culture, blood (x 2)     Status: None (Preliminary result)   Collection Time: 01/28/2020  8:00 AM   Specimen: BLOOD RIGHT FOREARM  Result Value Ref Range Status   Specimen Description BLOOD RIGHT FOREARM   Final   Special Requests   Final    BOTTLES DRAWN AEROBIC AND ANAEROBIC Blood Culture results may not be optimal due to an excessive volume of blood received in culture bottles   Culture   Final    NO GROWTH 1 DAY Performed at [facility], [place] [address], [place], [place] [address]    Report Status PENDING  Incomplete  Culture, blood (x 2)     Status: None (Preliminary result)   Collection Time: 01/24/2020 12:58 PM   Specimen: BLOOD  Result Value Ref Range Status   Specimen Description BLOOD LEFT ANTECUBITAL  Final   Special Requests   Final    BOTTLES DRAWN AEROBIC AND ANAEROBIC Blood Culture adequate volume   Culture   Final    NO GROWTH < 24 HOURS Performed at [facility], [place] [address], [place], [place] [address]    Report Status PENDING  Incomplete  SARS CORONAVIRUS 2 (TAT 6-24 HRS) Nasopharyngeal Nasopharyngeal Swab     Status: Abnormal   Collection Time: 01/05/2020  1:25 PM   Specimen: Nasopharyngeal Swab  Result Value Ref Range Status   SARS Coronavirus 2 POSITIVE (A) NEGATIVE Final    Comment: (NOTE) SARS-CoV-2 target nucleic acids are DETECTED.  The SARS-CoV-2 RNA is generally detectable in upper and lower respiratory specimens during the acute phase of infection. Positive results are indicative of the presence of SARS-CoV-2 RNA. Clinical correlation with patient history and other diagnostic information is  necessary to determine patient infection status. Positive results do not rule out bacterial infection or co-infection with other viruses.  The expected result is Negative.  Fact Sheet for Patients: SugarRoll.be  Fact Sheet for Healthcare Providers: https://www.woods-mathews.com/  This test is not yet approved or cleared by the [place] FDA and  has been authorized for detection and/or diagnosis of SARS-CoV-2 by FDA under an Emergency Use Authorization (EUA). This EUA will remain  in effect (meaning this  test can be used) for the duration of the COVID-19 declaration under Section 564(b)(1) of the Act,  21 U. S.C. section 360bbb-3(b)(1), unless the authorization is terminated or revoked sooner.   Performed at [facility], [place] [address], [place], [place] [address]     Radiology Reports CT Angio Chest PE W and/or Wo Contrast  Result Date: Feb 20, 2020 CLINICAL DATA:  Positive D-dimer and COVID-19 pneumonia, initial encounter EXAM: CT ANGIOGRAPHY CHEST WITH CONTRAST TECHNIQUE: Multidetector CT imaging of the chest was performed using the standard protocol during bolus administration of intravenous contrast. Multiplanar CT image reconstructions and MIPs were obtained to evaluate the vascular anatomy. CONTRAST:  17mL OMNIPAQUE IOHEXOL 350 MG/ML SOLN COMPARISON:  Chest x-ray from earlier in the same day, CT from 07/03/2017 FINDINGS: Cardiovascular: Thoracic aorta and its branches demonstrate atherosclerotic calcifications. No aneurysmal dilatation is seen. The degree of opacification of the aorta is limited. No cardiac enlargement is seen. Mitral and aortic valve calcifications are seen. Heavy coronary calcifications are noted. The pulmonary artery shows a normal branching pattern. No filling defect to suggest pulmonary embolism is noted. Mediastinum/Nodes: Thoracic inlet is within normal limits. No sizable hilar or mediastinal adenopathy is noted. Small precarinal nodes are seen. The esophagus as visualized is within normal limits. Lungs/Pleura: Lungs are well aerated bilaterally with patchy airspace opacities primarily in the lower lobes with some volume loss on the right. No sizable effusion or pneumothorax is seen. No parenchymal nodules are noted. Upper Abdomen: Visualized upper abdomen is within normal limits. Musculoskeletal: Degenerative changes of the thoracic spine are noted. No acute rib abnormality is seen. Review of the MIP images confirms the above findings. IMPRESSION: No evidence of  pulmonary emboli. Bilateral airspace opacity slightly worse on the right than the left similar to that seen on prior chest x-ray consistent with the given clinical history. Aortic Atherosclerosis (ICD10-I70.0). Electronically Signed   By: Inez Catalina M.D.   On: 02/20/2020 10:58   DG Chest Port 1 View  Result Date: 20-Feb-2020 CLINICAL DATA:  COVID pneumonia, dyspnea EXAM: PORTABLE CHEST 1 VIEW COMPARISON:  07/20/2019 FINDINGS: The lungs are symmetrically inflated. Elevation of the left hemidiaphragm is again noted. There is right mid and lower lung zone consolidation, new since prior examination, possibly infectious in the acute setting. Note that aspiration or pulmonary hemorrhage could appear similarly. Mild left basilar atelectasis or infiltrate noted. No pneumothorax or pleural effusion. Mild cardiomegaly is unchanged. IMPRESSION: Right mid and lower lung zone consolidation, in keeping with acute lobar pneumonia in the appropriate clinical setting. Probable left basilar atelectasis with associated stable elevation of the left hemidiaphragm. Electronically Signed   By: Fidela Salisbury MD   On: 20-Feb-2020 05:06

## 2020-01-30 NOTE — Plan of Care (Signed)
  Problem: Education: Goal: Knowledge of General Education information will improve Description: Including pain rating scale, medication(s)/side effects and non-pharmacologic comfort measures Outcome: Progressing   Problem: Health Behavior/Discharge Planning: Goal: Ability to manage health-related needs will improve Outcome: Progressing   Problem: Clinical Measurements: Goal: Ability to maintain clinical measurements within normal limits will improve Outcome: Progressing Goal: Will remain free from infection Outcome: Progressing Goal: Diagnostic test results will improve Outcome: Progressing Goal: Respiratory complications will improve Outcome: Progressing Goal: Cardiovascular complication will be avoided Outcome: Progressing   Problem: Activity: Goal: Risk for activity intolerance will decrease Outcome: Progressing   Problem: Nutrition: Goal: Adequate nutrition will be maintained Outcome: Progressing   Problem: Coping: Goal: Level of anxiety will decrease Outcome: Progressing   Problem: Elimination: Goal: Will not experience complications related to bowel motility Outcome: Progressing Goal: Will not experience complications related to urinary retention Outcome: Progressing   Problem: Pain Managment: Goal: General experience of comfort will improve Outcome: Progressing   Problem: Safety: Goal: Ability to remain free from injury will improve Outcome: Progressing   Problem: Skin Integrity: Goal: Risk for impaired skin integrity will decrease Outcome: Progressing   Problem: Education: Goal: Knowledge of risk factors and measures for prevention of condition will improve Outcome: Progressing   Problem: Respiratory: Goal: Will maintain a patent airway Outcome: Progressing Goal: Complications related to the disease process, condition or treatment will be avoided or minimized Outcome: Progressing   

## 2020-01-30 NOTE — Evaluation (Signed)
Physical Therapy Evaluation Patient Details Name: Ernest Clark. MRN: [id number] DOB: [birth year] Today's Date: 01/30/2020   History of Present Illness  87 y.o. male admitted on February 21, 2020 for drop in O2 sats.  Pt dx with COVID 19 01/24/20.  Pt with significant PMH of PAD, HTN, DVT, gout, DM, COPD (on 2 L O2 [place] at baseline).  Clinical Impression  Pt was able to ambulate around the room with RW and O2.  He is on 2 L O2 [place] and son reports this is his baseline amount.  He is less confused than yesterday (also per son), but not to his baseline.  He was active with PT at [facility] and son reports they plan to return there at d/c.   PT to follow acutely for deficits listed below.    Follow Up Recommendations SNF    Equipment Recommendations  None recommended by PT    Recommendations for Other Services       Precautions / Restrictions Precautions Precautions: Fall      Mobility  Bed Mobility Overal bed mobility: Needs Assistance Bed Mobility: Supine to Sit     Supine to sit: Min assist;HOB elevated     General bed mobility comments: min hand held assist to come to sitting EOB, used rail.    Transfers Overall transfer level: Needs assistance Equipment used: Rolling walker (2 wheeled) Transfers: Sit to/from Stand Sit to Stand: Min assist         General transfer comment: Min assist to steady trunk and RW.  Cues for safe hand placement during transitions.  Ambulation/Gait Ambulation/Gait assistance: Min assist Gait Distance (Feet): 15 Feet Assistive device: Rolling walker (2 wheeled) Gait Pattern/deviations: Step-through pattern;Staggering left;Staggering right     General Gait Details: Needed assist to steer RW around obstacles and for balance.  Pt was on 2 L O2 [place] with VSS throughout.  Stairs            Wheelchair Mobility    Modified Rankin (Stroke Patients Only)       Balance Overall balance assessment: Needs assistance Sitting-balance support: Feet  supported;Bilateral upper extremity supported;No upper extremity supported Sitting balance-Leahy Scale: Fair     Standing balance support: Bilateral upper extremity supported Standing balance-Leahy Scale: Poor                               Pertinent Vitals/Pain Pain Assessment: No/denies pain    Home Living Family/patient expects to be discharged to:: Skilled nursing facility                 Additional Comments: from [facility], was active in therapy    Prior Function Level of [place]: Needs assistance         Comments: per son, walking ~100' with RW with assist at therapy.     Hand Dominance   Dominant Hand: Right    Extremity/Trunk Assessment   Upper Extremity Assessment Upper Extremity Assessment: Defer to OT evaluation    Lower Extremity Assessment Lower Extremity Assessment: Generalized weakness    Cervical / Trunk Assessment Cervical / Trunk Assessment: Kyphotic  Communication   Communication: HOH (uses an amplifier at SNF, not here in the hospital)  Cognition Arousal/Alertness: Awake/alert Behavior During Therapy: WFL for tasks assessed/performed Overall Cognitive Status: Impaired/Different from baseline Area of Impairment: Orientation;Attention;Memory;Following commands;Safety/judgement;Awareness;Problem solving                 Orientation Level: Place;Time;Situation Current  Attention Level: Selective Memory: Decreased recall of precautions;Decreased short-term memory Following Commands: Follows one step commands inconsistently (hearing did not help command following ability) Safety/Judgement: Decreased awareness of safety;Decreased awareness of deficits Awareness: Intellectual   General Comments: Per son he is more confused than baseline, but improved on yesterday      General Comments      Exercises     Assessment/Plan    PT Assessment Patient needs continued PT services  PT Problem List Decreased  strength;Decreased activity tolerance;Decreased balance;Decreased mobility;Decreased knowledge of use of DME;Decreased cognition;Cardiopulmonary status limiting activity       PT Treatment Interventions DME instruction;Gait training;Functional mobility training;Therapeutic activities;Therapeutic exercise;Balance training;Patient/family education    PT Goals (Current goals can be found in the Care Plan section)  Acute Rehab PT Goals Patient Stated Goal: son wants him back to normal, return to [place]. PT Goal Formulation: With patient Time For Goal Achievement: 02/13/20 Potential to Achieve Goals: Good    Frequency Min 2X/week   Barriers to discharge        Co-evaluation               AM-PAC PT "6 Clicks" Mobility  Outcome Measure Help needed turning from your back to your side while in a flat bed without using bedrails?: A Little Help needed moving from lying on your back to sitting on the side of a flat bed without using bedrails?: A Little Help needed moving to and from a bed to a chair (including a wheelchair)?: A Little Help needed standing up from a chair using your arms (e.g., wheelchair or bedside chair)?: A Little Help needed to walk in hospital room?: A Little Help needed climbing 3-5 steps with a railing? : A Lot 6 Click Score: 17    End of Session Equipment Utilized During Treatment: Gait belt;Oxygen Activity Tolerance: Patient tolerated treatment well Patient left: in chair;with call bell/phone within reach;with family/visitor present Nurse Communication: Mobility status PT Visit Diagnosis: Muscle weakness (generalized) (M62.81);Difficulty in walking, not elsewhere classified (R26.2)    Time: 1700-1749 PT Time Calculation (min) (ACUTE ONLY): 30 min   Charges:   PT Evaluation $PT Eval Moderate Complexity: 1 Mod PT Treatments $Gait Training: 8-22 mins       Verdene Lennert, PT, DPT  Acute Rehabilitation [phone] pager [phone]) [phone]  office

## 2020-01-30 NOTE — Evaluation (Signed)
Clinical/Bedside Swallow Evaluation Patient Details  Name: Ernest Clark. MRN: [id number] Date of Birth: 1931/12/10  Today's Date: 01/30/2020 Time: SLP Start Time (ACUTE ONLY): 1730 SLP Stop Time (ACUTE ONLY): 1742 SLP Time Calculation (min) (ACUTE ONLY): 12 min  Past Medical History:  Past Medical History:  Diagnosis Date  . COPD (chronic obstructive pulmonary disease) ([place])   . Coronary heart disease    remote PCI  . Diabetes mellitus   . DVT (deep venous thrombosis) ([place]) 2011  . Gout   . Hyperlipidemia   . Hypertension   . Peripheral arterial disease ([place])    Past Surgical History:  Past Surgical History:  Procedure Laterality Date  . CARDIAC CATHETERIZATION  1994   w/ PTCA   . COLONOSCOPY     w/ polyp resection  . TONSILLECTOMY  as a child   HPI:  Pt is an 87 y.o. male admitted on 02/03/20 for drop in O2 sats.  Pt dx with COVID 19 01/24/20.  Pt with significant PMH of PAD, HTN, dementia, DVT, gout, DM, COPD (on 2 L O2 [place] at baseline). CT chest 1/27: Bilateral airspace opacity slightly worse on the right than the left similar to that seen on prior chest x-ray consistent with the given clinical history.   Assessment / Plan / Recommendation Clinical Impression  Pt was seen for bedside swallow evaluation which was limited due to his cooperation and confusion. Pt shouted "Johnny, bring the banjo!", "Johnny, come down here!", or some variation thereof for most the evaluation and he did not follow commands. Pt was inconsistently redirectable to participate in the evaluation, but refused some trials despite encouragement. Pt did not participate in an oral mech. exam. He  he was edentulous and no dentures were found at bedside. Pt frequently spoke following administration of trials and mastication was prolonged with dysphagia 3 solids. He tolerated puree solids, dysphagia 2 solids, and nectar thick liquids without overt s/sx of aspiration, but consistently exhibited coughing with  thin liquids. A dysphagia 2 diet with nectar thick liquids is recommended at this time. SLP will follow to assess improvement in swallow function, and for potential diet advancement. SLP Visit Diagnosis: Dysphagia, unspecified (R13.10)    Aspiration Risk  Mild aspiration risk    Diet Recommendation Dysphagia 2 (Fine chop);Nectar-thick liquid   Liquid Administration via: Cup;Straw Medication Administration: Crushed with puree Supervision: Staff to assist with self feeding Compensations: Small sips/bites;Slow rate Postural Changes: Seated upright at 90 degrees    Other  Recommendations Oral Care Recommendations: Oral care BID   Follow up Recommendations  (TBD)      Frequency and Duration min 2x/week  2 weeks       Prognosis Prognosis for Safe Diet Advancement: Fair Barriers to Reach Goals: Cognitive deficits      Swallow Study   General Date of Onset: 03-Feb-2020 HPI: Pt is an 87 y.o. male admitted on 02/03/20 for drop in O2 sats.  Pt dx with COVID 19 01/24/20.  Pt with significant PMH of PAD, HTN, dementia, DVT, gout, DM, COPD (on 2 L O2 [place] at baseline). CT chest 1/27: Bilateral airspace opacity slightly worse on the right than the left similar to that seen on prior chest x-ray consistent with the given clinical history. Type of Study: Bedside Swallow Evaluation Previous Swallow Assessment: None Diet Prior to this Study: Thin liquids;Regular;Dysphagia 3 (soft) Temperature Spikes Noted: No Respiratory Status: Room air History of Recent Intubation: No Behavior/Cognition: Cooperative;Confused Oral Care Completed by SLP: No Oral  [place] - Dentition: Edentulous Vision: Functional for self-feeding Self-Feeding Abilities: Needs assist Patient Positioning: Upright in bed;Postural control adequate for testing Baseline Vocal Quality: Normal Volitional Cough: Strong Volitional Swallow: Unable to elicit    Oral/Motor/Sensory Function Overall Oral Motor/Sensory Function:  (Pt did not  participate in OME)   Ice Chips Ice chips: Within functional limits Presentation: Spoon   Thin Liquid Thin Liquid: Impaired Presentation: Straw Pharyngeal  Phase Impairments: Cough - Immediate    Nectar Thick Nectar Thick Liquid: Within functional limits Presentation: Straw   Honey Thick Honey Thick Liquid: Not tested   Puree Puree: Within functional limits Presentation: Spoon   Solid     Solid: Impaired Oral Phase Impairments: Impaired mastication;Poor awareness of bolus Oral Phase Functional Implications: Impaired mastication     Kasarah Sitts I. Hardin Negus, [place], [place] Office number [phone] Pager [phone]  [name] 01/30/2020,5:51 PM

## 2020-01-31 ENCOUNTER — Inpatient Hospital Stay (HOSPITAL_COMMUNITY): Payer: Medicare Other

## 2020-01-31 DIAGNOSIS — J9602 Acute respiratory failure with hypercapnia: Secondary | ICD-10-CM | POA: Diagnosis not present

## 2020-01-31 DIAGNOSIS — J9601 Acute respiratory failure with hypoxia: Secondary | ICD-10-CM | POA: Diagnosis not present

## 2020-01-31 LAB — D-DIMER, QUANTITATIVE: D-Dimer, Quant: 0.69 ug/mL-FEU — ABNORMAL HIGH (ref 0.00–0.50)

## 2020-01-31 LAB — BRAIN NATRIURETIC PEPTIDE: B Natriuretic Peptide: 531.7 pg/mL — ABNORMAL HIGH (ref 0.0–100.0)

## 2020-01-31 LAB — CBC
HCT: 42.9 % (ref 39.0–52.0)
Hemoglobin: 12.6 g/dL — ABNORMAL LOW (ref 13.0–17.0)
MCH: 28.8 pg (ref 26.0–34.0)
MCHC: 29.4 g/dL — ABNORMAL LOW (ref 30.0–36.0)
MCV: 97.9 fL (ref 80.0–100.0)
Platelets: 220 10*3/uL (ref 150–400)
RBC: 4.38 MIL/uL (ref 4.22–5.81)
RDW: 16.4 % — ABNORMAL HIGH (ref 11.5–15.5)
WBC: 11.2 10*3/uL — ABNORMAL HIGH (ref 4.0–10.5)
nRBC: 0 % (ref 0.0–0.2)

## 2020-01-31 LAB — BASIC METABOLIC PANEL
Anion gap: 13 (ref 5–15)
BUN: 17 mg/dL (ref 8–23)
CO2: 31 mmol/L (ref 22–32)
Calcium: 8.8 mg/dL — ABNORMAL LOW (ref 8.9–10.3)
Chloride: 97 mmol/L — ABNORMAL LOW (ref 98–111)
Creatinine, Ser: 0.78 mg/dL (ref 0.61–1.24)
GFR, Estimated: >60 mL/min (ref >60)
Glucose, Bld: 187 mg/dL — ABNORMAL HIGH (ref 70–99)
Potassium: 4.1 mmol/L (ref 3.5–5.1)
Sodium: 141 mmol/L (ref 135–145)

## 2020-01-31 LAB — C-REACTIVE PROTEIN: CRP: 7.6 mg/dL — ABNORMAL HIGH (ref <1.0)

## 2020-01-31 LAB — PROCALCITONIN: Procalcitonin: <0.1 ng/mL

## 2020-01-31 LAB — GLUCOSE, CAPILLARY
Glucose-Capillary: 162 mg/dL — ABNORMAL HIGH (ref 70–99)
Glucose-Capillary: 175 mg/dL — ABNORMAL HIGH (ref 70–99)

## 2020-01-31 LAB — MAGNESIUM: Magnesium: 2.2 mg/dL (ref 1.7–2.4)

## 2020-01-31 MED ORDER — IPRATROPIUM-ALBUTEROL 20-100 MCG/ACT IN AERS
1.0000 | INHALATION_SPRAY | Freq: Three times a day (TID) | RESPIRATORY_TRACT | Status: DC
  Administered 2020-01-31 – 2020-02-02 (×7): 1 via RESPIRATORY_TRACT

## 2020-01-31 MED ORDER — METHYLPREDNISOLONE SODIUM SUCC 125 MG IJ SOLR
60.0000 mg | Freq: Two times a day (BID) | INTRAMUSCULAR | Status: DC
  Administered 2020-01-31 – 2020-02-01 (×2): 60 mg via INTRAVENOUS
  Filled 2020-01-31 (×3): qty 2

## 2020-01-31 NOTE — Plan of Care (Signed)
  Problem: Education: Goal: Knowledge of General Education information will improve Description: Including pain rating scale, medication(s)/side effects and non-pharmacologic comfort measures 01/31/2020 1039 by Burnice Logan, LPN Outcome: Progressing 01/30/2020 2252 by Burnice Logan, LPN Outcome: Progressing   Problem: Health Behavior/Discharge Planning: Goal: Ability to manage health-related needs will improve 01/31/2020 1039 by Burnice Logan, LPN Outcome: Progressing 01/30/2020 2252 by Burnice Logan, LPN Outcome: Progressing   Problem: Clinical Measurements: Goal: Ability to maintain clinical measurements within normal limits will improve 01/31/2020 1039 by Burnice Logan, LPN Outcome: Progressing 01/30/2020 2252 by Burnice Logan, LPN Outcome: Progressing Goal: Will remain free from infection 01/31/2020 1039 by Burnice Logan, LPN Outcome: Progressing 01/30/2020 2252 by Burnice Logan, LPN Outcome: Progressing Goal: Diagnostic test results will improve 01/31/2020 1039 by Burnice Logan, LPN Outcome: Progressing 01/30/2020 2252 by Burnice Logan, LPN Outcome: Progressing Goal: Respiratory complications will improve 01/31/2020 1039 by Burnice Logan, LPN Outcome: Progressing 01/30/2020 2252 by Burnice Logan, LPN Outcome: Progressing Goal: Cardiovascular complication will be avoided 01/31/2020 1039 by Burnice Logan, LPN Outcome: Progressing 01/30/2020 2252 by Burnice Logan, LPN Outcome: Progressing   Problem: Activity: Goal: Risk for activity intolerance will decrease 01/31/2020 1039 by Burnice Logan, LPN Outcome: Progressing 01/30/2020 2252 by Burnice Logan, LPN Outcome: Progressing   Problem: Nutrition: Goal: Adequate nutrition will be maintained 01/31/2020 1039 by Burnice Logan, LPN Outcome: Progressing 01/30/2020 2252 by Burnice Logan, LPN Outcome: Progressing   Problem:  Coping: Goal: Level of anxiety will decrease 01/31/2020 1039 by Burnice Logan, LPN Outcome: Progressing 01/30/2020 2252 by Burnice Logan, LPN Outcome: Progressing   Problem: Elimination: Goal: Will not experience complications related to bowel motility 01/31/2020 1039 by Burnice Logan, LPN Outcome: Progressing 01/30/2020 2252 by Burnice Logan, LPN Outcome: Progressing Goal: Will not experience complications related to urinary retention 01/31/2020 1039 by Burnice Logan, LPN Outcome: Progressing 01/30/2020 2252 by Burnice Logan, LPN Outcome: Progressing   Problem: Pain Managment: Goal: General experience of comfort will improve 01/31/2020 1039 by Burnice Logan, LPN Outcome: Progressing 01/30/2020 2252 by Burnice Logan, LPN Outcome: Progressing   Problem: Safety: Goal: Ability to remain free from injury will improve 01/31/2020 1039 by Burnice Logan, LPN Outcome: Progressing 01/30/2020 2252 by Burnice Logan, LPN Outcome: Progressing   Problem: Skin Integrity: Goal: Risk for impaired skin integrity will decrease 01/31/2020 1039 by Burnice Logan, LPN Outcome: Progressing 01/30/2020 2252 by Burnice Logan, LPN Outcome: Progressing   Problem: Education: Goal: Knowledge of risk factors and measures for prevention of condition will improve 01/31/2020 1039 by Burnice Logan, LPN Outcome: Progressing 01/30/2020 2252 by Burnice Logan, LPN Outcome: Progressing   Problem: Respiratory: Goal: Will maintain a patent airway 01/31/2020 1039 by Burnice Logan, LPN Outcome: Progressing 01/30/2020 2252 by Burnice Logan, LPN Outcome: Progressing Goal: Complications related to the disease process, condition or treatment will be avoided or minimized 01/31/2020 1039 by Burnice Logan, LPN Outcome: Progressing 01/30/2020 2252 by Burnice Logan, LPN Outcome: Progressing

## 2020-01-31 NOTE — Evaluation (Signed)
Occupational Therapy Evaluation Patient Details Name: Ernest Clark. MRN: [id number] DOB: 1931/09/02 Today's Date: 01/31/2020    History of Present Illness 87 y.o. male admitted on 01/31/2020 for drop in O2 sats.  Pt dx with COVID 19 01/24/20.  Pt with significant PMH of PAD, HTN, DVT, gout, DM, COPD (on 2 L O2 [place] at baseline).   Clinical Impression   PTA, pt was living at [facility] place where staff assists with ADLs and he performs mobility with therapy. Pt currently requiring Min A for UB ADLs, Max A for UB ADLs, and Min guard A for functional mobility. Pt follow simple, direct cues but requiring increased time and repeated cues; limited by [facility] in loud room. Pt also with moments of possible hallucinations. SpO2 90s on 2L. Pt would benefit from further acute OT to facilitate safe dc. Recommend dc to SNF for further OT to optimize safety, independence with ADLs, and return to PLOF.     Follow Up Recommendations  SNF;Supervision/Assistance - 24 hour    Equipment Recommendations  Other (comment) (Defer to next venue)    Recommendations for Other Services PT consult     Precautions / Restrictions Precautions Precautions: Fall      Mobility Bed Mobility Overal bed mobility: Needs Assistance Bed Mobility: Supine to Sit     Supine to sit: HOB elevated;Min guard     General bed mobility comments: Min guard A for safety and cues for using rail.    Transfers Overall transfer level: Needs assistance Equipment used: Rolling walker (2 wheeled) Transfers: Sit to/from Stand Sit to Stand: Min guard         General transfer comment: Min Guard A for safety in standing    Balance Overall balance assessment: Needs assistance Sitting-balance support: Feet supported;Bilateral upper extremity supported;No upper extremity supported Sitting balance-Leahy Scale: Fair     Standing balance support: Bilateral upper extremity supported Standing balance-Leahy Scale: Poor Standing balance  comment: reliant on UE support                           ADL either performed or assessed with clinical judgement   ADL Overall ADL's : Needs assistance/impaired Eating/Feeding: Minimal assistance;Sitting   Grooming: Minimal assistance;Sitting   Upper Body Bathing: Minimal assistance;Sitting   Lower Body Bathing: Maximal assistance;Sit to/from stand   Upper Body Dressing : Minimal assistance;Sitting   Lower Body Dressing: Maximal assistance;Sit to/from stand Lower Body Dressing Details (indicate cue type and reason): Max A to don socks. Pt assisting by lifting feet to therapist Toilet Transfer: Min guard;Ambulation;RW (simulated to recliner) Toilet Transfer Details (indicate cue type and reason): Min guard A for safety         Functional mobility during ADLs: Min guard;Rolling walker General ADL Comments: Pt presenting with decreased balance, strength, activity tolerance, and cognition     Vision         Perception     Praxis      Pertinent Vitals/Pain Pain Assessment: No/denies pain     Hand Dominance Right   Extremity/Trunk Assessment Upper Extremity Assessment Upper Extremity Assessment: Generalized weakness   Lower Extremity Assessment Lower Extremity Assessment: Defer to PT evaluation   Cervical / Trunk Assessment Cervical / Trunk Assessment: Kyphotic   Communication Communication Communication: HOH (uses an amplifier at SNF, not here in the hospital)   Cognition Arousal/Alertness: Awake/alert Behavior During Therapy: WFL for tasks assessed/performed Overall Cognitive Status: Impaired/Different from baseline Area of Impairment:  Orientation;Attention;Memory;Following commands;Safety/judgement;Awareness;Problem solving                 Orientation Level: Place;Time;Situation;Disoriented to Current Attention Level: Selective Memory: Decreased recall of precautions;Decreased short-term memory Following Commands: Follows one step  commands inconsistently (hearing did not help command following ability) Safety/Judgement: Decreased awareness of safety;Decreased awareness of deficits Awareness: Intellectual Problem Solving: Slow processing;Difficulty sequencing;Requires verbal cues General Comments: Per son he is more confused than baseline. Pt able to follow simple, direct commands with increased time and cues. Pt with difficulty maintaining conversation or answering questions; partly due to [facility] and decreased attention. At times, pt hallucinating.   General Comments  Son present throughout session. Maintaining SpO2 in 90s on 2L.    Exercises     Shoulder Instructions      Home Living Family/patient expects to be discharged to:: Skilled nursing facility                                 Additional Comments: from [facility], was active in therapy      Prior Functioning/Environment Level of Independence: Needs assistance        Comments: Per son, walking ~100' with RW with assist at therapy. Staff assist with ADLs.        OT Problem List: Decreased strength;Decreased range of motion;Decreased activity tolerance;Impaired balance (sitting and/or standing);Decreased cognition;Decreased safety awareness;Decreased knowledge of use of DME or AE;Decreased knowledge of precautions      OT Treatment/Interventions: Self-care/ADL training;Therapeutic exercise;Energy conservation;DME and/or AE instruction;Therapeutic activities;Patient/family education    OT Goals(Current goals can be found in the care plan section) Acute Rehab OT Goals Patient Stated Goal: son wants him back to normal, return to [place]. OT Goal Formulation: With patient Time For Goal Achievement: 02/14/20 Potential to Achieve Goals: Good  OT Frequency: Min 2X/week   Barriers to D/C:            Co-evaluation              AM-PAC OT "6 Clicks" Daily Activity     Outcome Measure Help from another person eating meals?: A Little Help  from another person taking care of personal grooming?: A Little Help from another person toileting, which includes using toliet, bedpan, or urinal?: A Little Help from another person bathing (including washing, rinsing, drying)?: A Lot Help from another person to put on and taking off regular upper body clothing?: A Little Help from another person to put on and taking off regular lower body clothing?: A Lot 6 Click Score: 16   End of Session Equipment Utilized During Treatment: Rolling walker;Gait belt;Oxygen Nurse Communication: Mobility status  Activity Tolerance: Patient tolerated treatment well Patient left: in chair;with call bell/phone within reach;with chair alarm set  OT Visit Diagnosis: Unsteadiness on feet (R26.81);Other abnormalities of gait and mobility (R26.89);Muscle weakness (generalized) (M62.81)                Time: 2778-2423 OT Time Calculation (min): 33 min Charges:  OT General Charges $OT Visit: 1 Visit OT Evaluation $OT Eval Moderate Complexity: 1 Mod OT Treatments $Self Care/Home Management : 8-22 mins  Sahid Borba MSOT, OTR/L Acute Rehab Pager: [phone] Office: [place] 01/31/2020, 4:46 PM

## 2020-01-31 NOTE — Progress Notes (Signed)
Patient has been restless and agitated pulling out his oxygen tubing and condom catheter. Reached out to on call MD for safety sitter orders since pt at risk for acute respiratory distress d/t desating. Obtained new orders for a tele sitter, ineffective, MD notified about pt's behavior, obtained new orders for a one on one sitter.

## 2020-01-31 NOTE — Progress Notes (Signed)
PROGRESS NOTE                                                                                                                                                                                                             Patient Demographics:    Ernest Clark, is a 87 y.o. male, DOB - [birth year], SD:[id number]  Outpatient Primary MD for the patient is Hulan Fess, MD   Admit date - 01/17/2020   LOS - 2  Chief Complaint  Patient presents with  . Covid Positive       Brief Narrative: Patient is a 87 y.o. male with PMHx of CAD s/p PCI, PAD, HTN, HLD, COPD on home O2 2 L/min, DVT-presented to the hospital with acute on chronic hypoxic and hypercarbic respiratory failure due to HCAP/bacterial and COVID-19 infection.  COVID-19 vaccinated status: Vaccinated  Significant Events: 1/27>> Admit to [facility] for acute hypoxic/hypercarbic respiratory failure-HCAP/COVID-19 infection  Significant studies: 1/27>> CTA chest: No PE, bilateral airspace opacity-right>> left   COVID-19 medications: Steroids: 1/27>> Remdesivir: 1/27>>  Antibiotics: Rocephin: 1/27>> Zithromax: 1/27>>  Microbiology data: 1/27 >>blood culture: No growth  Procedures: None  Consults: PCCM  DVT prophylaxis: enoxaparin (LOVENOX) injection 40 mg Start: 01/13/2020 0900    Subjective:   Patient currently in bed, somewhat confused unable to answer questions or follow commands reliably, appears to be in no distress.   Assessment  & Plan :   Acute on chronic hypoxemic and hypercarbic respiratory failure: Suspect this is mostly from bacterial/HCAP-likely aspiration pneumonia-could have some element of COVID-19 infection.  Seems to have improved compared to on admission-back on usual home regimen of 2 L of oxygen.  Continue empiric antimicrobial therapy/steroids and Remdesivir.  Await culture data and narrow antibiotic spectrum accordingly.    Speech therapy  following for safe diet.  Currently on nectar thick liquids with dysphagia 2 diet per speech.  Fever: afebrile O2 requirements:  SpO2: 99 % O2 Flow Rate (L/min): 3.5 L/min     Recent Labs  Lab 01/10/2020 0445 01/06/2020 0625 01/20/2020 0845 01/09/2020 0952 01/10/2020 1139 01/07/2020 1258 02/02/2020 1259 01/17/2020 1325 01/22/2020 1439 01/30/20 0516  WBC  --  21.4*  --   --   --   --   --   --   --  9.3  HGB  --  12.8* 13.6  --   --   --   --   --  13.3 12.0*  HCT  --  43.2 40.0  --   --   --   --   --  39.0 39.1  PLT  --  202  --   --   --   --   --   --   --  182  DDIMER 0.95*  --   --   --   --   --   --   --   --   --   AST  --   --   --   --   --  31  --   --   --   --   ALT  --   --   --   --   --  28  --   --   --   --   ALKPHOS  --   --   --   --   --  90  --   --   --   --   BILITOT  --   --   --   --   --  0.6  --   --   --   --   ALBUMIN  --   --   --   --   --  2.6*  --   --   --   --   LATICACIDVEN  --   --   --  1.1 1.6  --  2.2*  --   --   --   SARSCOV2NAA  --   --   --   --   --   --   --  POSITIVE*  --   --       Prone/Incentive Spirometry: encouraged patient to lie prone for 3-4 hours at a time for a total of 16 hours a day, and to encourage incentive spirometry use 3-4/hour.  Sepsis: Secondary to PNA-sepsis physiology improved-awaiting culture data-see above regarding IV antibiotics.  Delirium/acute metabolic encephalopathy superimposed on dementia: Suspect delirium/metabolic encephalopathy due to hypoxemia/pneumonia.  Continue to expect some amount of delirium during this hospital stay.  Maintain delirium precautions.  Since pulling on telemetry leads-suspect okay to stop telemetry monitoring as he will not put them on anyway.  Will resume Cymbalta and Remeron.  Will need to be cautious with sedating agents given history of COPD and hypercarbia on presentation.  CAD/PAD: All stable-continue aspirin/Crestor  COPD: Not in flare-continue bronchodilators  Hypothyroidism:  Continue levothyroxine  Obesity: BMI 32.  Follow with PCP.  DM-2: CBG stable-continue SSI  Lab Results  Component Value Date   HGBA1C 7.0 (H) 01/30/2020     Recent Labs    01/30/20 2313 01/31/20 0331 01/31/20 0808  GLUCAP 157* 162* 175*       Condition - Stable  Family Communication  :  Son- [phone] left voicemail on 01/30/20, left voicemail on 01/31/2020 at 9:27 AM.  Code Status :  DNR  Diet :  Diet Order            DIET DYS 2 Room service appropriate? No; Fluid consistency: Nectar Thick  Diet effective now                  Disposition Plan  :   Status is: Inpatient  Remains inpatient appropriate because:Inpatient level of care appropriate due to severity of illness   Dispo: The patient is from: SNF              Anticipated d/c is to:  SNF              Anticipated d/c date is: > 3 days              Patient currently is not medically stable to d/c.   Difficult to place patient No  Barriers to discharge: Hypoxia requiring O2 supplementation/complete 5 days of IV Remdesivir  Antimicorbials  :    Anti-infectives (From admission, onward)   Start     Dose/Rate Route Frequency Ordered Stop   01/30/20 1000  remdesivir 100 mg in sodium chloride 0.9 % 100 mL IVPB       "Followed by" Linked Group Details   100 mg 200 mL/hr over 30 Minutes Intravenous Daily 01/23/2020 1105 02/03/20 0959   01/26/2020 1200  remdesivir 200 mg in sodium chloride 0.9% 250 mL IVPB       "Followed by" Linked Group Details   200 mg 580 mL/hr over 30 Minutes Intravenous Once 01/18/2020 1105 01/28/2020 1405   01/21/2020 0830  cefTRIAXone (ROCEPHIN) 2 g in sodium chloride 0.9 % 100 mL IVPB        2 g 200 mL/hr over 30 Minutes Intravenous Every 24 hours 01/26/2020 0805 02/03/20 0829   01/09/2020 0815  azithromycin (ZITHROMAX) 500 mg in sodium chloride 0.9 % 250 mL IVPB        500 mg 250 mL/hr over 60 Minutes Intravenous Every 24 hours 01/25/2020 0805 02/03/20 0814   01/04/2020 0700  ceFEPIme (MAXIPIME)  2 g in sodium chloride 0.9 % 100 mL IVPB  Status:  Discontinued        2 g 200 mL/hr over 30 Minutes Intravenous Every 8 hours 01/12/2020 0656 01/10/2020 0805      Inpatient Medications  Scheduled Meds: . aspirin EC  81 mg Oral Daily  . DULoxetine  40 mg Oral Daily  . enoxaparin (LOVENOX) injection  40 mg Subcutaneous Q24H  . Ipratropium-Albuterol  1 puff Inhalation TID  . levothyroxine  50 mcg Oral QAC breakfast  . methylPREDNISolone (SOLU-MEDROL) injection  60 mg Intravenous Q12H  . mirtazapine  7.5 mg Oral QHS  . pantoprazole  40 mg Oral Q1200  . rosuvastatin  40 mg Oral Daily  . umeclidinium-vilanterol  1 puff Inhalation Daily   Continuous Infusions: . azithromycin 500 mg (01/30/20 0952)  . cefTRIAXone (ROCEPHIN)  IV 2 g (01/31/20 [id number])  . remdesivir 100 mg in NS 100 mL 100 mg (01/30/20 1130)   PRN Meds:.Ipratropium-Albuterol   Time Spent in minutes  25  See all Orders from today for further details   Lala Lund M.D on 01/31/2020 at 9:25 AM  To page go to [email or web address] - use universal password  [facility] -  Office  [phone]    Objective:   Vitals:   01/30/20 2032 01/30/20 2357 01/31/20 0339 01/31/20 0517  BP: 121/76 (!) 149/107 (!) 141/84   Pulse: 96 93 98   Resp: 20 18 18    Temp: 97.9 F (36.6 C) 97.8 F (36.6 C) (!) 97 F (36.1 C) 98.9 F (37.2 C)  TempSrc: Axillary     SpO2: 93% 98% 99%   Weight:        Wt Readings from Last 3 Encounters:  01/10/2020 95.7 kg  09/17/19 95.7 kg  07/22/19 103 kg     Intake/Output Summary (Last 24 hours) at 01/31/2020 0925 Last data filed at 01/31/2020 0513 Gross per 24 hour  Intake 450 ml  Output 800 ml  Net -350 ml  Physical Exam  Awake but confused, appears weak, moves all 4 extremities to painful stimuli but unable to follow commands reliably  Ingenio.AT,PERRAL Supple Neck,No JVD, No cervical lymphadenopathy appriciated.  Symmetrical Chest wall movement, Good air movement bilaterally,  CTAB RRR,No Gallops, Rubs or new Murmurs, No Parasternal Heave +ve B.Sounds, Abd Soft, No tenderness, No organomegaly appriciated, No rebound - guarding or rigidity. No Cyanosis, Clubbing or edema, No new Rash or bruise    Data Review:    CBC Recent Labs  Lab Feb 17, 2020 0625 17-Feb-2020 0845 17-Feb-2020 1439 01/30/20 0516  WBC 21.4*  --   --  9.3  HGB 12.8* 13.6 13.3 12.0*  HCT 43.2 40.0 39.0 39.1  PLT 202  --   --  182  MCV 98.0  --   --  97.5  MCH 29.0  --   --  29.9  MCHC 29.6*  --   --  30.7  RDW 16.5*  --   --  16.5*  LYMPHSABS 0.7  --   --  0.5*  MONOABS 1.3*  --   --  0.2  EOSABS 0.1  --   --  0.0  BASOSABS 0.0  --   --  0.0    Chemistries  Recent Labs  Lab 02/17/20 0445 02/17/2020 0845 17-Feb-2020 1258 2020/02/17 1439  NA 137 136  --  135  K 4.7 4.2  --  4.4  CL 95*  --   --   --   CO2 33*  --   --   --   GLUCOSE 187*  --   --   --   BUN 13  --   --   --   CREATININE 0.84  --   --   --   CALCIUM 8.8*  --   --   --   AST  --   --  31  --   ALT  --   --  28  --   ALKPHOS  --   --  90  --   BILITOT  --   --  0.6  --    ------------------------------------------------------------------------------------------------------------------ No results for input(s): CHOL, HDL, LDLCALC, TRIG, CHOLHDL, LDLDIRECT in the last 72 hours.  Lab Results  Component Value Date   HGBA1C 7.0 (H) 01/30/2020   ------------------------------------------------------------------------------------------------------------------ Recent Labs    02-17-20 1259  TSH 2.700   ------------------------------------------------------------------------------------------------------------------ No results for input(s): VITAMINB12, FOLATE, FERRITIN, TIBC, IRON, RETICCTPCT in the last 72 hours.  Coagulation profile No results for input(s): INR, PROTIME in the last 168 hours.  Recent Labs    2020/02/17 0445  DDIMER 0.95*    Cardiac Enzymes No results for input(s): CKMB, TROPONINI, MYOGLOBIN in the  last 168 hours.  Invalid input(s): CK ------------------------------------------------------------------------------------------------------------------    Component Value Date/Time   BNP 30.8 07/07/2016 1344    Micro Results Recent Results (from the past 240 hour(s))  Culture, blood (x 2)     Status: None (Preliminary result)   Collection Time: 17-Feb-2020  8:00 AM   Specimen: BLOOD RIGHT FOREARM  Result Value Ref Range Status   Specimen Description BLOOD RIGHT FOREARM  Final   Special Requests   Final    BOTTLES DRAWN AEROBIC AND ANAEROBIC Blood Culture results may not be optimal due to an excessive volume of blood received in culture bottles   Culture   Final    NO GROWTH 1 DAY Performed at [facility], [place] [address], [place], [place] [address]    Report  Status PENDING  Incomplete  Culture, blood (x 2)     Status: None (Preliminary result)   Collection Time: 02/02/2020 12:58 PM   Specimen: BLOOD  Result Value Ref Range Status   Specimen Description BLOOD LEFT ANTECUBITAL  Final   Special Requests   Final    BOTTLES DRAWN AEROBIC AND ANAEROBIC Blood Culture adequate volume   Culture   Final    NO GROWTH < 24 HOURS Performed at [facility], [address], [place], [place] [address]    Report Status PENDING  Incomplete  SARS CORONAVIRUS 2 (TAT 6-24 HRS) Nasopharyngeal Nasopharyngeal Swab     Status: Abnormal   Collection Time: 01/27/2020  1:25 PM   Specimen: Nasopharyngeal Swab  Result Value Ref Range Status   SARS Coronavirus 2 POSITIVE (A) NEGATIVE Final    Comment: (NOTE) SARS-CoV-2 target nucleic acids are DETECTED.  The SARS-CoV-2 RNA is generally detectable in upper and lower respiratory specimens during the acute phase of infection. Positive results are indicative of the presence of SARS-CoV-2 RNA. Clinical correlation with patient history and other diagnostic information is  necessary to determine patient infection status. Positive results do not rule  out bacterial infection or co-infection with other viruses.  The expected result is Negative.  Fact Sheet for Patients: SugarRoll.be  Fact Sheet for Healthcare Providers: https://www.woods-mathews.com/  This test is not yet approved or cleared by the [place] FDA and  has been authorized for detection and/or diagnosis of SARS-CoV-2 by FDA under an Emergency Use Authorization (EUA). This EUA will remain  in effect (meaning this test can be used) for the duration of the COVID-19 declaration under Section 564(b)(1) of the Act, 21 U. S.C. section 360bbb-3(b)(1), unless the authorization is terminated or revoked sooner.   Performed at [facility], [place] [address], [place], [place] [address]     Radiology Reports CT Angio Chest PE W and/or Wo Contrast  Result Date: 01/04/2020 CLINICAL DATA:  Positive D-dimer and COVID-19 pneumonia, initial encounter EXAM: CT ANGIOGRAPHY CHEST WITH CONTRAST TECHNIQUE: Multidetector CT imaging of the chest was performed using the standard protocol during bolus administration of intravenous contrast. Multiplanar CT image reconstructions and MIPs were obtained to evaluate the vascular anatomy. CONTRAST:  57mL OMNIPAQUE IOHEXOL 350 MG/ML SOLN COMPARISON:  Chest x-ray from earlier in the same day, CT from 07/03/2017 FINDINGS: Cardiovascular: Thoracic aorta and its branches demonstrate atherosclerotic calcifications. No aneurysmal dilatation is seen. The degree of opacification of the aorta is limited. No cardiac enlargement is seen. Mitral and aortic valve calcifications are seen. Heavy coronary calcifications are noted. The pulmonary artery shows a normal branching pattern. No filling defect to suggest pulmonary embolism is noted. Mediastinum/Nodes: Thoracic inlet is within normal limits. No sizable hilar or mediastinal adenopathy is noted. Small precarinal nodes are seen. The esophagus as visualized is within normal  limits. Lungs/Pleura: Lungs are well aerated bilaterally with patchy airspace opacities primarily in the lower lobes with some volume loss on the right. No sizable effusion or pneumothorax is seen. No parenchymal nodules are noted. Upper Abdomen: Visualized upper abdomen is within normal limits. Musculoskeletal: Degenerative changes of the thoracic spine are noted. No acute rib abnormality is seen. Review of the MIP images confirms the above findings. IMPRESSION: No evidence of pulmonary emboli. Bilateral airspace opacity slightly worse on the right than the left similar to that seen on prior chest x-ray consistent with the given clinical history. Aortic Atherosclerosis (ICD10-I70.0). Electronically Signed   By: Elta Guadeloupe  Lukens M.D.   On: 01/07/2020 10:58   DG Chest Port 1 View  Result Date: 01/31/2020 CLINICAL DATA:  Shortness of breath.  COVID positive. EXAM: PORTABLE CHEST 1 VIEW COMPARISON:  Chest x-rays dated 01/03/2020 and 07/20/2019. Chest CT dated 01/11/2020. FINDINGS: Bilateral airspace opacities, RIGHT greater than LEFT, not significantly changed. Heart size and mediastinal contours appear stable. No pleural effusion or pneumothorax is seen. IMPRESSION: Stable bilateral multifocal pneumonia, RIGHT greater than LEFT. Electronically Signed   By: Franki Cabot M.D.   On: 01/31/2020 09:04   DG Chest Port 1 View  Result Date: 01/28/2020 CLINICAL DATA:  COVID pneumonia, dyspnea EXAM: PORTABLE CHEST 1 VIEW COMPARISON:  07/20/2019 FINDINGS: The lungs are symmetrically inflated. Elevation of the left hemidiaphragm is again noted. There is right mid and lower lung zone consolidation, new since prior examination, possibly infectious in the acute setting. Note that aspiration or pulmonary hemorrhage could appear similarly. Mild left basilar atelectasis or infiltrate noted. No pneumothorax or pleural effusion. Mild cardiomegaly is unchanged. IMPRESSION: Right mid and lower lung zone consolidation, in keeping with  acute lobar pneumonia in the appropriate clinical setting. Probable left basilar atelectasis with associated stable elevation of the left hemidiaphragm. Electronically Signed   By: Fidela Salisbury MD   On: 01/07/2020 05:06

## 2020-02-01 ENCOUNTER — Inpatient Hospital Stay (HOSPITAL_COMMUNITY): Payer: Medicare Other

## 2020-02-01 DIAGNOSIS — J9602 Acute respiratory failure with hypercapnia: Secondary | ICD-10-CM | POA: Diagnosis not present

## 2020-02-01 DIAGNOSIS — J9601 Acute respiratory failure with hypoxia: Secondary | ICD-10-CM | POA: Diagnosis not present

## 2020-02-01 LAB — CBC WITH DIFFERENTIAL/PLATELET
Abs Immature Granulocytes: 0.06 10*3/uL (ref 0.00–0.07)
Basophils Absolute: 0 10*3/uL (ref 0.0–0.1)
Basophils Relative: 0 %
Eosinophils Absolute: 0 10*3/uL (ref 0.0–0.5)
Eosinophils Relative: 0 %
HCT: 42.4 % (ref 39.0–52.0)
Hemoglobin: 12.5 g/dL — ABNORMAL LOW (ref 13.0–17.0)
Immature Granulocytes: 1 %
Lymphocytes Relative: 7 %
Lymphs Abs: 0.6 10*3/uL — ABNORMAL LOW (ref 0.7–4.0)
MCH: 28.6 pg (ref 26.0–34.0)
MCHC: 29.5 g/dL — ABNORMAL LOW (ref 30.0–36.0)
MCV: 97 fL (ref 80.0–100.0)
Monocytes Absolute: 0.7 10*3/uL (ref 0.1–1.0)
Monocytes Relative: 8 %
Neutro Abs: 7.3 10*3/uL (ref 1.7–7.7)
Neutrophils Relative %: 84 %
Platelets: 242 10*3/uL (ref 150–400)
RBC: 4.37 MIL/uL (ref 4.22–5.81)
RDW: 16.2 % — ABNORMAL HIGH (ref 11.5–15.5)
WBC: 8.7 10*3/uL (ref 4.0–10.5)
nRBC: 0 % (ref 0.0–0.2)

## 2020-02-01 LAB — BRAIN NATRIURETIC PEPTIDE: B Natriuretic Peptide: 451.7 pg/mL — ABNORMAL HIGH (ref 0.0–100.0)

## 2020-02-01 LAB — C-REACTIVE PROTEIN: CRP: 5.3 mg/dL — ABNORMAL HIGH (ref <1.0)

## 2020-02-01 LAB — PROCALCITONIN: Procalcitonin: <0.1 ng/mL

## 2020-02-01 LAB — COMPREHENSIVE METABOLIC PANEL
ALT: 31 U/L (ref 0–44)
AST: 32 U/L (ref 15–41)
Albumin: 2.7 g/dL — ABNORMAL LOW (ref 3.5–5.0)
Alkaline Phosphatase: 80 U/L (ref 38–126)
Anion gap: 11 (ref 5–15)
BUN: 17 mg/dL (ref 8–23)
CO2: 34 mmol/L — ABNORMAL HIGH (ref 22–32)
Calcium: 8.9 mg/dL (ref 8.9–10.3)
Chloride: 95 mmol/L — ABNORMAL LOW (ref 98–111)
Creatinine, Ser: 0.85 mg/dL (ref 0.61–1.24)
GFR, Estimated: >60 mL/min (ref >60)
Glucose, Bld: 161 mg/dL — ABNORMAL HIGH (ref 70–99)
Potassium: 3.7 mmol/L (ref 3.5–5.1)
Sodium: 140 mmol/L (ref 135–145)
Total Bilirubin: 0.7 mg/dL (ref 0.3–1.2)
Total Protein: 6 g/dL — ABNORMAL LOW (ref 6.5–8.1)

## 2020-02-01 LAB — GLUCOSE, CAPILLARY
Glucose-Capillary: 135 mg/dL — ABNORMAL HIGH (ref 70–99)
Glucose-Capillary: 116 mg/dL — ABNORMAL HIGH (ref 70–99)
Glucose-Capillary: 131 mg/dL — ABNORMAL HIGH (ref 70–99)
Glucose-Capillary: 187 mg/dL — ABNORMAL HIGH (ref 70–99)
Glucose-Capillary: 190 mg/dL — ABNORMAL HIGH (ref 70–99)

## 2020-02-01 LAB — MAGNESIUM: Magnesium: 2.2 mg/dL (ref 1.7–2.4)

## 2020-02-01 LAB — D-DIMER, QUANTITATIVE: D-Dimer, Quant: 0.48 ug/mL-FEU (ref 0.00–0.50)

## 2020-02-01 MED ORDER — HALOPERIDOL LACTATE 5 MG/ML IJ SOLN
1.0000 mg | Freq: Four times a day (QID) | INTRAMUSCULAR | Status: DC | PRN
  Administered 2020-02-01 – 2020-02-04 (×3): 1 mg via INTRAVENOUS
  Filled 2020-02-01 (×4): qty 1

## 2020-02-01 MED ORDER — HALOPERIDOL LACTATE 5 MG/ML IJ SOLN
2.5000 mg | Freq: Once | INTRAMUSCULAR | Status: AC
  Administered 2020-02-01: 2.5 mg via INTRAVENOUS
  Filled 2020-02-01: qty 1

## 2020-02-01 MED ORDER — METHYLPREDNISOLONE SODIUM SUCC 40 MG IJ SOLR
40.0000 mg | Freq: Every day | INTRAMUSCULAR | Status: DC
  Administered 2020-02-02 – 2020-02-05 (×4): 40 mg via INTRAVENOUS
  Filled 2020-02-01 (×4): qty 1

## 2020-02-01 MED ORDER — KCL IN DEXTROSE-NACL 10-5-0.45 MEQ/L-%-% IV SOLN
INTRAVENOUS | Status: AC
  Filled 2020-02-01 (×3): qty 1000

## 2020-02-01 NOTE — Progress Notes (Signed)
PROGRESS NOTE                                                                                                                                                                                                             Patient Demographics:    Ernest Clark, is a 87 y.o. male, DOB - 10/18/1931, FO:[id number]  Outpatient Primary MD for the patient is Hulan Fess, MD   Admit date - 01/11/2020   LOS - 3  Chief Complaint  Patient presents with  . Covid Positive       Brief Narrative: Patient is a 87 y.o. male with PMHx of CAD s/p PCI, PAD, HTN, HLD, COPD on home O2 2 L/min, DVT-presented to the hospital with acute on chronic hypoxic and hypercarbic respiratory failure due to HCAP/bacterial and COVID-19 infection.  COVID-19 vaccinated status: Vaccinated  Significant Events: 1/27>> Admit to [facility] for acute hypoxic/hypercarbic respiratory failure-HCAP/COVID-19 infection  Significant studies: 1/27>> CTA chest: No PE, bilateral airspace opacity-right>> left   COVID-19 medications: Steroids: 1/27>> Remdesivir: 1/27>>  Antibiotics: Rocephin: 1/27>> Zithromax: 1/27>>  Microbiology data: 1/27 >>blood culture: No growth  Procedures: None  Consults: PCCM  DVT prophylaxis: enoxaparin (LOVENOX) injection 40 mg Start: 01/17/2020 0900    Subjective:   Patient currently in bed, somnolent this morning after receiving Haldol earlier, unable to answer questions or follow commands.   Assessment  & Plan :   Acute on chronic hypoxemic and hypercarbic respiratory failure: Suspect this is mostly from bacterial/HCAP-likely aspiration pneumonia-could have some element of COVID-19 infection.  Seems to have improved compared to on admission-back on usual home regimen of 2 L of oxygen.  Continue empiric antimicrobial therapy/steroids and Remdesivir.  Await culture data and narrow antibiotic spectrum accordingly.    Speech therapy  following for safe diet.  Currently on nectar thick liquids with dysphagia 2 diet per speech.  O2 requirements:  SpO2: 97 % O2 Flow Rate (L/min): 3 L/min FiO2 (%): 28 %     Recent Labs  Lab 01/28/2020 0445 01/25/2020 0625 01/06/2020 0625 01/31/2020 0845 01/07/2020 0952 01/28/2020 1139 01/09/2020 1258 01/25/2020 1259 01/04/2020 1325 01/06/2020 1439 01/30/20 0516 01/31/20 0852 02/01/20 0049  WBC  --  21.4*  --   --   --   --   --   --   --   --  9.3 11.2*  8.7  HGB  --  12.8*   < > 13.6  --   --   --   --   --  13.3 12.0* 12.6* 12.5*  HCT  --  43.2   < > 40.0  --   --   --   --   --  39.0 39.1 42.9 42.4  PLT  --  202  --   --   --   --   --   --   --   --  182 220 242  CRP  --   --   --   --   --   --   --   --   --   --   --  7.6* 5.3*  BNP  --   --   --   --   --   --   --   --   --   --   --  531.7* 451.7*  DDIMER 0.95*  --   --   --   --   --   --   --   --   --   --  0.69* 0.48  PROCALCITON  --   --   --   --   --   --   --   --   --   --   --  <0.10 <0.10  AST  --   --   --   --   --   --  31  --   --   --   --   --  32  ALT  --   --   --   --   --   --  28  --   --   --   --   --  31  ALKPHOS  --   --   --   --   --   --  90  --   --   --   --   --  80  BILITOT  --   --   --   --   --   --  0.6  --   --   --   --   --  0.7  ALBUMIN  --   --   --   --   --   --  2.6*  --   --   --   --   --  2.7*  LATICACIDVEN  --   --   --   --  1.1 1.6  --  2.2*  --   --   --   --   --   SARSCOV2NAA  --   --   --   --   --   --   --   --  POSITIVE*  --   --   --   --    < > = values in this interval not displayed.      Prone/Incentive Spirometry: encouraged patient to lie prone for 3-4 hours at a time for a total of 16 hours a day, and to encourage incentive spirometry use 3-4/hour.  Sepsis due to underlying COVID-19 pneumonia: Secondary to PNA-sepsis physiology has resolved.  So far cultures negative procalcitonin is undetectable hence will discontinue antibiotics on 02/01/2020.  Delirium/acute  metabolic encephalopathy superimposed on dementia: Suspect delirium/metabolic encephalopathy due to hypoxemia/pneumonia.  On home dose Cymbalta and Remeron, received Haldol early morning 02/01/2020 and extremely somnolent after  that, will also check head CT to rule out any acute insult.  Continue supportive care, minimize benzodiazepines and narcotics as much as possible.  Since he is somnolent gentle hydration for 24 hours on 02/01/2020.  CAD/PAD: All stable-continue aspirin/Crestor  COPD: Not in flare-continue bronchodilators  Hypothyroidism: Continue levothyroxine  Obesity: BMI 32.  Follow with PCP.  DM-2: CBG stable-continue SSI  Lab Results  Component Value Date   HGBA1C 7.0 (H) 01/30/2020     Recent Labs    01/31/20 0808 02/01/20 0444 02/01/20 0902  GLUCAP 175* 135* 116*     Condition - Stable  Family Communication  :  Son- [phone] left voicemail on 01/30/20, left voicemail on 01/31/2020 at 9:27 AM.  Code Status :  DNR  Diet :  Diet Order            DIET DYS 2 Room service appropriate? No; Fluid consistency: Nectar Thick  Diet effective now                  Disposition Plan  :   Status is: Inpatient  Remains inpatient appropriate because:Inpatient level of care appropriate due to severity of illness   Dispo: The patient is from: SNF              Anticipated d/c is to: SNF              Anticipated d/c date is: > 3 days              Patient currently is not medically stable to d/c.   Difficult to place patient No  Barriers to discharge: Hypoxia requiring O2 supplementation/complete 5 days of IV Remdesivir  Antimicorbials  :    Anti-infectives (From admission, onward)   Start     Dose/Rate Route Frequency Ordered Stop   01/30/20 1000  remdesivir 100 mg in sodium chloride 0.9 % 100 mL IVPB       "Followed by" Linked Group Details   100 mg 200 mL/hr over 30 Minutes Intravenous Daily 01/06/2020 1105 02/03/20 0959   01/04/2020 1200  remdesivir 200 mg in  sodium chloride 0.9% 250 mL IVPB       "Followed by" Linked Group Details   200 mg 580 mL/hr over 30 Minutes Intravenous Once 01/16/2020 1105 01/21/2020 1405   01/20/2020 0830  cefTRIAXone (ROCEPHIN) 2 g in sodium chloride 0.9 % 100 mL IVPB  Status:  Discontinued        2 g 200 mL/hr over 30 Minutes Intravenous Every 24 hours 01/07/2020 0805 02/01/20 1133   01/26/2020 0815  azithromycin (ZITHROMAX) 500 mg in sodium chloride 0.9 % 250 mL IVPB  Status:  Discontinued        500 mg 250 mL/hr over 60 Minutes Intravenous Every 24 hours 01/20/2020 0805 02/01/20 1133   01/30/2020 0700  ceFEPIme (MAXIPIME) 2 g in sodium chloride 0.9 % 100 mL IVPB  Status:  Discontinued        2 g 200 mL/hr over 30 Minutes Intravenous Every 8 hours 01/16/2020 0656 01/25/2020 0805      Inpatient Medications  Scheduled Meds: . aspirin EC  81 mg Oral Daily  . DULoxetine  40 mg Oral Daily  . enoxaparin (LOVENOX) injection  40 mg Subcutaneous Q24H  . Ipratropium-Albuterol  1 puff Inhalation TID  . levothyroxine  50 mcg Oral QAC breakfast  . [START ON 02/02/2020] methylPREDNISolone (SOLU-MEDROL) injection  40 mg Intravenous Daily  . mirtazapine  7.5 mg Oral QHS  .  pantoprazole  40 mg Oral Q1200  . rosuvastatin  40 mg Oral Daily  . umeclidinium-vilanterol  1 puff Inhalation Daily   Continuous Infusions: . remdesivir 100 mg in NS 100 mL 100 mg (02/01/20 1118)   PRN Meds:.haloperidol lactate, Ipratropium-Albuterol   Time Spent in minutes  25  See all Orders from today for further details   Lala Lund M.D on 02/01/2020 at 11:34 AM  To page go to [email or web address] - use universal password  [facility] -  Office  [phone]    Objective:   Vitals:   01/31/20 0517 01/31/20 1522 01/31/20 1552 02/01/20 0451  BP:  128/82  113/72  Pulse:  96  88  Resp:  17  18  Temp: 98.9 F (37.2 C) 98.6 F (37 C)  97.7 F (36.5 C)  TempSrc:    Oral  SpO2:  92% 95% 97%  Weight:        Wt Readings from Last 3 Encounters:   02-13-2020 95.7 kg  09/17/19 95.7 kg  07/22/19 103 kg    No intake or output data in the 24 hours ending 02/01/20 1134   Physical Exam  Somnolent after receiving Haldol early this morning, unable to answer questions or follow commands, appears extremely weak [place].AT,PERRAL Supple Neck,No JVD, No cervical lymphadenopathy appriciated.  Symmetrical Chest wall movement, Good air movement bilaterally, CTAB RRR,No Gallops, Rubs or new Murmurs, No Parasternal Heave +ve B.Sounds, Abd Soft, No tenderness, No organomegaly appriciated, No rebound - guarding or rigidity. No Cyanosis, Clubbing or edema, No new Rash or bruise     Data Review:    CBC Recent Labs  Lab Feb 13, 2020 0625 02/13/2020 0845 02/13/20 1439 01/30/20 0516 01/31/20 0852 02/01/20 0049  WBC 21.4*  --   --  9.3 11.2* 8.7  HGB 12.8* 13.6 13.3 12.0* 12.6* 12.5*  HCT 43.2 40.0 39.0 39.1 42.9 42.4  PLT 202  --   --  182 220 242  MCV 98.0  --   --  97.5 97.9 97.0  MCH 29.0  --   --  29.9 28.8 28.6  MCHC 29.6*  --   --  30.7 29.4* 29.5*  RDW 16.5*  --   --  16.5* 16.4* 16.2*  LYMPHSABS 0.7  --   --  0.5*  --  0.6*  MONOABS 1.3*  --   --  0.2  --  0.7  EOSABS 0.1  --   --  0.0  --  0.0  BASOSABS 0.0  --   --  0.0  --  0.0    Chemistries  Recent Labs  Lab 02-13-20 0445 02/13/20 0845 02-13-2020 1258 13-Feb-2020 1439 01/31/20 0852 02/01/20 0049  NA 137 136  --  135 141 140  K 4.7 4.2  --  4.4 4.1 3.7  CL 95*  --   --   --  97* 95*  CO2 33*  --   --   --  31 34*  GLUCOSE 187*  --   --   --  187* 161*  BUN 13  --   --   --  17 17  CREATININE 0.84  --   --   --  0.78 0.85  CALCIUM 8.8*  --   --   --  8.8* 8.9  MG  --   --   --   --  2.2 2.2  AST  --   --  31  --   --  32  ALT  --   --  28  --   --  31  ALKPHOS  --   --  90  --   --  80  BILITOT  --   --  0.6  --   --  0.7   ------------------------------------------------------------------------------------------------------------------ No results for input(s): CHOL,  HDL, LDLCALC, TRIG, CHOLHDL, LDLDIRECT in the last 72 hours.  Lab Results  Component Value Date   HGBA1C 7.0 (H) 01/30/2020   ------------------------------------------------------------------------------------------------------------------ Recent Labs    01/19/2020 1259  TSH 2.700   ------------------------------------------------------------------------------------------------------------------ No results for input(s): VITAMINB12, FOLATE, FERRITIN, TIBC, IRON, RETICCTPCT in the last 72 hours.  Coagulation profile No results for input(s): INR, PROTIME in the last 168 hours.  Recent Labs    01/31/20 0852 02/01/20 0049  DDIMER 0.69* 0.48    Cardiac Enzymes No results for input(s): CKMB, TROPONINI, MYOGLOBIN in the last 168 hours.  Invalid input(s): CK ------------------------------------------------------------------------------------------------------------------    Component Value Date/Time   BNP 451.7 (H) 02/01/2020 0049    Micro Results Recent Results (from the past 240 hour(s))  Culture, blood (x 2)     Status: None (Preliminary result)   Collection Time: 01/07/2020  8:00 AM   Specimen: BLOOD RIGHT FOREARM  Result Value Ref Range Status   Specimen Description BLOOD RIGHT FOREARM  Final   Special Requests   Final    BOTTLES DRAWN AEROBIC AND ANAEROBIC Blood Culture results may not be optimal due to an excessive volume of blood received in culture bottles   Culture   Final    NO GROWTH 3 DAYS Performed at [facility], [place] [address] [place], [place] [address]    Report Status PENDING  Incomplete  Culture, blood (x 2)     Status: None (Preliminary result)   Collection Time: 01/19/2020 12:58 PM   Specimen: BLOOD  Result Value Ref Range Status   Specimen Description BLOOD LEFT ANTECUBITAL  Final   Special Requests   Final    BOTTLES DRAWN AEROBIC AND ANAEROBIC Blood Culture adequate volume   Culture   Final    NO GROWTH 3 DAYS Performed at [facility], [place] [address], [place], [place] [address]    Report Status PENDING  Incomplete  SARS CORONAVIRUS 2 (TAT 6-24 HRS) Nasopharyngeal Nasopharyngeal Swab     Status: Abnormal   Collection Time: 01/27/2020  1:25 PM   Specimen: Nasopharyngeal Swab  Result Value Ref Range Status   SARS Coronavirus 2 POSITIVE (A) NEGATIVE Final    Comment: (NOTE) SARS-CoV-2 target nucleic acids are DETECTED.  The SARS-CoV-2 RNA is generally detectable in upper and lower respiratory specimens during the acute phase of infection. Positive results are indicative of the presence of SARS-CoV-2 RNA. Clinical correlation with patient history and other diagnostic information is  necessary to determine patient infection status. Positive results do not rule out bacterial infection or co-infection with other viruses.  The expected result is Negative.  Fact Sheet for Patients: SugarRoll.be  Fact Sheet for Healthcare Providers: https://www.woods-mathews.com/  This test is not yet approved or cleared by the [place] FDA and  has been authorized for detection and/or diagnosis of SARS-CoV-2 by FDA under an Emergency Use Authorization (EUA). This EUA will remain  in effect (meaning this test can be used) for the duration of the COVID-19 declaration under Section 564(b)(1) of the Act, 21 U. S.C. section 360bbb-3(b)(1), unless the authorization is terminated or revoked sooner.   Performed at [facility], [place] [address] [place],  [address]  Radiology Reports CT Angio Chest PE W and/or Wo Contrast  Result Date: 01/14/2020 CLINICAL DATA:  Positive D-dimer and COVID-19 pneumonia, initial encounter EXAM: CT ANGIOGRAPHY CHEST WITH CONTRAST TECHNIQUE: Multidetector CT imaging of the chest was performed using the standard protocol during bolus administration of intravenous contrast. Multiplanar CT image reconstructions and MIPs were obtained to evaluate the  vascular anatomy. CONTRAST:  9mL OMNIPAQUE IOHEXOL 350 MG/ML SOLN COMPARISON:  Chest x-ray from earlier in the same day, CT from 07/03/2017 FINDINGS: Cardiovascular: Thoracic aorta and its branches demonstrate atherosclerotic calcifications. No aneurysmal dilatation is seen. The degree of opacification of the aorta is limited. No cardiac enlargement is seen. Mitral and aortic valve calcifications are seen. Heavy coronary calcifications are noted. The pulmonary artery shows a normal branching pattern. No filling defect to suggest pulmonary embolism is noted. Mediastinum/Nodes: Thoracic inlet is within normal limits. No sizable hilar or mediastinal adenopathy is noted. Small precarinal nodes are seen. The esophagus as visualized is within normal limits. Lungs/Pleura: Lungs are well aerated bilaterally with patchy airspace opacities primarily in the lower lobes with some volume loss on the right. No sizable effusion or pneumothorax is seen. No parenchymal nodules are noted. Upper Abdomen: Visualized upper abdomen is within normal limits. Musculoskeletal: Degenerative changes of the thoracic spine are noted. No acute rib abnormality is seen. Review of the MIP images confirms the above findings. IMPRESSION: No evidence of pulmonary emboli. Bilateral airspace opacity slightly worse on the right than the left similar to that seen on prior chest x-ray consistent with the given clinical history. Aortic Atherosclerosis (ICD10-I70.0). Electronically Signed   By: Inez Catalina M.D.   On: 01/24/2020 10:58   DG Chest Port 1 View  Result Date: 01/31/2020 CLINICAL DATA:  Shortness of breath.  COVID positive. EXAM: PORTABLE CHEST 1 VIEW COMPARISON:  Chest x-rays dated 01/23/2020 and 07/20/2019. Chest CT dated 01/25/2020. FINDINGS: Bilateral airspace opacities, RIGHT greater than LEFT, not significantly changed. Heart size and mediastinal contours appear stable. No pleural effusion or pneumothorax is seen. IMPRESSION: Stable  bilateral multifocal pneumonia, RIGHT greater than LEFT. Electronically Signed   By: Franki Cabot M.D.   On: 01/31/2020 09:04   DG Chest Port 1 View  Result Date: 02/02/2020 CLINICAL DATA:  COVID pneumonia, dyspnea EXAM: PORTABLE CHEST 1 VIEW COMPARISON:  07/20/2019 FINDINGS: The lungs are symmetrically inflated. Elevation of the left hemidiaphragm is again noted. There is right mid and lower lung zone consolidation, new since prior examination, possibly infectious in the acute setting. Note that aspiration or pulmonary hemorrhage could appear similarly. Mild left basilar atelectasis or infiltrate noted. No pneumothorax or pleural effusion. Mild cardiomegaly is unchanged. IMPRESSION: Right mid and lower lung zone consolidation, in keeping with acute lobar pneumonia in the appropriate clinical setting. Probable left basilar atelectasis with associated stable elevation of the left hemidiaphragm. Electronically Signed   By: Fidela Salisbury MD   On: 01/03/2020 05:06

## 2020-02-01 NOTE — NC FL2 (Signed)
[place] LEVEL OF CARE SCREENING TOOL     IDENTIFICATION  Patient Name: Ernest Clark. Birthdate: May 05, 1931 Sex: male Admission Date (Current Location): 01/22/2020  [facility] and [place] Number:  Herbalist and Address:  [facility] [place] [place], [place] [address] [place], [place] [address]      Provider Number: [id number]  Attending Physician Name and Address:  Thurnell Lose, MD  Relative Name and Phone Number:       Current Level of Care: Hospital Recommended Level of Care: [place] Prior Approval Number:    Date Approved/Denied:   PASRR Number:    Discharge Plan: SNF    Current Diagnoses: Patient Active Problem List   Diagnosis Date Noted  . HCAP (healthcare-associated pneumonia) 01/25/2020  . Hypotension 01/28/2020  . COVID-19 virus infection 01/15/2020  . Diabetes mellitus type 2, uncontrolled ([place]) 01/09/2020  . DNR (do not resuscitate) 01/23/2020  . Hypothyroid 01/15/2020  . Palliative care encounter   . Elevated troponin   . Acute respiratory failure with hypoxia and hypercapnia (HCC)   . Hav (hallux abducto valgus), left 04/15/2019  . Hav (hallux abducto valgus), right 04/15/2019  . Nail, injury by, initial encounter 10/15/2018  . Pain due to onychomycosis of toenails of both feet 07/09/2018  . Type 2 diabetes mellitus with vascular disease ([place]) 07/09/2018  . Allergic rhinitis 03/08/2017  . Solitary pulmonary nodule 03/08/2017  . Hyponatremia 07/04/2016  . Type 2 diabetes mellitus without complication, with long-term current use of insulin ([place]) 07/04/2016  . Multiple rib fractures 07/04/2016  . Peripheral arterial disease ([place]) 09/25/2013  . Coronary heart disease 09/25/2013  . Essential hypertension 09/25/2013  . Hyperlipidemia 09/25/2013  . COPD (chronic obstructive pulmonary disease) ([place]) 12/09/2010    Orientation RESPIRATION BLADDER Height & Weight     Self  O2 ([place] 2L) Incontinent  Weight: 211 lb (95.7 kg) Height:     BEHAVIORAL SYMPTOMS/MOOD NEUROLOGICAL BOWEL NUTRITION STATUS      Incontinent Diet (see DC summary)  AMBULATORY STATUS COMMUNICATION OF NEEDS Skin   Extensive Assist Verbally Normal                       Personal Care Assistance Level of Assistance  Bathing,Feeding,Dressing Bathing Assistance: Maximum assistance Feeding assistance: Limited assistance Dressing Assistance: Maximum assistance     Functional Limitations Info             SPECIAL CARE FACTORS FREQUENCY  PT (By licensed PT),OT (By licensed OT)     PT Frequency: 5x/wk OT Frequency: 5x/wk            Contractures Contractures Info: Not present    Additional Factors Info  Code Status,Allergies,Psychotropic,Isolation Precautions Code Status Info: DNR Allergies Info: Actos (Pioglitazone Hydrochloride), Angiotensin Receptor Blockers, Furosemide, Vicodin Hp (Hydrocodone-acetaminophen), Metformin And Related Psychotropic Info: Cymbalta 40mg  daily, Remeron 7.5mg  daily at bed   Isolation Precautions Info: Airborne/Contact: COVID-19     Current Medications (02/01/2020):  This is the current hospital active medication list Current Facility-Administered Medications  Medication Dose Route Frequency Provider Last Rate Last Admin  . aspirin EC tablet 81 mg  81 mg Oral Daily Jonetta Osgood, MD   81 mg at 02/01/20 0900  . azithromycin (ZITHROMAX) 500 mg in sodium chloride 0.9 % 250 mL IVPB  500 mg Intravenous Q24H Jonetta Osgood, MD 250 mL/hr at 02/01/20 0851 500 mg at 02/01/20 0851  . cefTRIAXone (ROCEPHIN) 2 g in  sodium chloride 0.9 % 100 mL IVPB  2 g Intravenous Q24H Smith, Rondell A, MD 200 mL/hr at 02/01/20 0805 2 g at 02/01/20 0805  . DULoxetine (CYMBALTA) DR capsule 40 mg  40 mg Oral Daily Jonetta Osgood, MD   40 mg at 02/01/20 0900  . enoxaparin (LOVENOX) injection 40 mg  40 mg Subcutaneous Q24H Tamala Julian, Rondell A, MD   40 mg at 02/01/20 0813  . Ipratropium-Albuterol  (COMBIVENT) respimat 1 puff  1 puff Inhalation Q6H PRN Fuller Plan A, MD   1 puff at 02/22/20 1427  . Ipratropium-Albuterol (COMBIVENT) respimat 1 puff  1 puff Inhalation TID Jonetta Osgood, MD   1 puff at 01/31/20 2027  . levothyroxine (SYNTHROID) tablet 50 mcg  50 mcg Oral QAC breakfast Jonetta Osgood, MD   50 mcg at 01/31/20 0541  . methylPREDNISolone sodium succinate (SOLU-MEDROL) 125 mg/2 mL injection 60 mg  60 mg Intravenous Q12H Thurnell Lose, MD   60 mg at 02/01/20 0900  . mirtazapine (REMERON) tablet 7.5 mg  7.5 mg Oral QHS Jonetta Osgood, MD   7.5 mg at 01/31/20 2026  . pantoprazole (PROTONIX) EC tablet 40 mg  40 mg Oral Q1200 Jonetta Osgood, MD   40 mg at 01/31/20 1250  . remdesivir 100 mg in sodium chloride 0.9 % 100 mL IVPB  100 mg Intravenous Daily Smith, Rondell A, MD 200 mL/hr at 01/31/20 1253 100 mg at 01/31/20 1253  . rosuvastatin (CRESTOR) tablet 40 mg  40 mg Oral Daily Jonetta Osgood, MD   40 mg at 02/01/20 0900  . umeclidinium-vilanterol (ANORO ELLIPTA) 62.5-25 MCG/INH 1 puff  1 puff Inhalation Daily Fuller Plan A, MD   1 puff at 01/31/20 1000     Discharge Medications: Please see discharge summary for a list of discharge medications.  Relevant Imaging Results:  Relevant Lab Results:   Additional Information SS#: [id number]  Geralynn Ochs, LCSW

## 2020-02-01 NOTE — TOC Initial Note (Signed)
Transition of Care (TOC) - Initial/Assessment Note    Patient Details  Name: Ernest Clark. MRN: [id number] Date of Birth: 1931-08-10  Transition of Care [facility]) CM/SW Contact:    Geralynn Ochs, LCSW Phone Number: 02/01/2020, 10:35 AM  Clinical Narrative:    Patient from [facility]. Receiving Remdesivir through 1/31. CSW to follow for return to [place] [facility] when medically stable.              Expected Discharge Plan: Skilled Nursing Facility Barriers to Discharge: Continued Medical Work up,Insurance Authorization   Patient Goals and CMS Choice Patient states their goals for this hospitalization and ongoing recovery are:: patient unable to participate in goal setting, only oriented to self      Expected Discharge Plan and Services Expected Discharge Plan: [place] Choice: [place] arrangements for the past 2 months: [place]                                      Prior Living Arrangements/Services Living arrangements for the past 2 months: [place] Lives with:: Facility Resident Patient language and need for interpreter reviewed:: No Do you feel safe going back to the place where you live?: Yes      Need for Family Participation in Patient Care: Yes (Comment) Care giver support system in place?: Yes (comment)   Criminal Activity/Legal Involvement Pertinent to Current Situation/Hospitalization: No - Comment as needed  Activities of Daily Living      Permission Sought/Granted Permission sought to share information with : Facility Retail banker granted to share information with : Yes, Verbal Permission Granted  Share Information with NAME: Jaimon  Permission granted to share info w AGENCY: [place] granted to share info w Relationship: Son     Emotional Assessment   Attitude/Demeanor/Rapport: Unable to Assess Affect  (typically observed): Unable to Assess Orientation: : Oriented to Self      Admission diagnosis:  Acute respiratory failure with hypoxia ([place]) [J96.01] HCAP (healthcare-associated pneumonia) [J18.9] Normochromic normocytic anemia [D64.9] Acute and chr resp failure, unsp w hypoxia or hypercapnia ([place]) [J96.20] Patient Active Problem List   Diagnosis Date Noted  . HCAP (healthcare-associated pneumonia) 02-07-20  . Hypotension 2020-02-07  . COVID-19 virus infection 2020/02/07  . Diabetes mellitus type 2, uncontrolled ([place]) 2020-02-07  . DNR (do not resuscitate) 02-07-2020  . Hypothyroid 02/07/2020  . Palliative care encounter   . Elevated troponin   . Acute respiratory failure with hypoxia and hypercapnia (HCC)   . Hav (hallux abducto valgus), left 04/15/2019  . Hav (hallux abducto valgus), right 04/15/2019  . Nail, injury by, initial encounter 10/15/2018  . Pain due to onychomycosis of toenails of both feet 07/09/2018  . Type 2 diabetes mellitus with vascular disease ([place]) 07/09/2018  . Allergic rhinitis 03/08/2017  . Solitary pulmonary nodule 03/08/2017  . Hyponatremia 07/04/2016  . Type 2 diabetes mellitus without complication, with long-term current use of insulin ([place]) 07/04/2016  . Multiple rib fractures 07/04/2016  . Peripheral arterial disease ([place]) 09/25/2013  . Coronary heart disease 09/25/2013  . Essential hypertension 09/25/2013  . Hyperlipidemia 09/25/2013  . COPD (chronic obstructive pulmonary disease) ([place]) 12/09/2010   PCP:  Hulan Fess, MD Pharmacy:   [facility] ([place] - [address] [place] [address]  Phone: [phone] Fax: [place], [place] [address] [place] [address] Phone: [phone] Fax: [place], [place] [address] Phone: [phone] Fax: [phone]  [facility] - [place], [place] [address] [place] [address] Phone: [phone] Fax: [phone]     Social Determinants of Health (SDOH) Interventions    Readmission Risk Interventions No flowsheet data found.

## 2020-02-02 DIAGNOSIS — J9602 Acute respiratory failure with hypercapnia: Secondary | ICD-10-CM | POA: Diagnosis not present

## 2020-02-02 DIAGNOSIS — J9601 Acute respiratory failure with hypoxia: Secondary | ICD-10-CM | POA: Diagnosis not present

## 2020-02-02 LAB — D-DIMER, QUANTITATIVE: D-Dimer, Quant: 0.59 ug/mL-FEU — ABNORMAL HIGH (ref 0.00–0.50)

## 2020-02-02 LAB — MAGNESIUM: Magnesium: 2.2 mg/dL (ref 1.7–2.4)

## 2020-02-02 LAB — C-REACTIVE PROTEIN: CRP: 3.2 mg/dL — ABNORMAL HIGH (ref <1.0)

## 2020-02-02 LAB — COMPREHENSIVE METABOLIC PANEL
ALT: 25 U/L (ref 0–44)
AST: 23 U/L (ref 15–41)
Albumin: 2.6 g/dL — ABNORMAL LOW (ref 3.5–5.0)
Alkaline Phosphatase: 73 U/L (ref 38–126)
Anion gap: 9 (ref 5–15)
BUN: 17 mg/dL (ref 8–23)
CO2: 35 mmol/L — ABNORMAL HIGH (ref 22–32)
Calcium: 8.7 mg/dL — ABNORMAL LOW (ref 8.9–10.3)
Chloride: 98 mmol/L (ref 98–111)
Creatinine, Ser: 0.86 mg/dL (ref 0.61–1.24)
GFR, Estimated: >60 mL/min (ref >60)
Glucose, Bld: 169 mg/dL — ABNORMAL HIGH (ref 70–99)
Potassium: 3.7 mmol/L (ref 3.5–5.1)
Sodium: 142 mmol/L (ref 135–145)
Total Bilirubin: 0.8 mg/dL (ref 0.3–1.2)
Total Protein: 5.8 g/dL — ABNORMAL LOW (ref 6.5–8.1)

## 2020-02-02 LAB — CBC WITH DIFFERENTIAL/PLATELET
Abs Immature Granulocytes: 0.11 10*3/uL — ABNORMAL HIGH (ref 0.00–0.07)
Basophils Absolute: 0 10*3/uL (ref 0.0–0.1)
Basophils Relative: 0 %
Eosinophils Absolute: 0 10*3/uL (ref 0.0–0.5)
Eosinophils Relative: 0 %
HCT: 42 % (ref 39.0–52.0)
Hemoglobin: 12.4 g/dL — ABNORMAL LOW (ref 13.0–17.0)
Immature Granulocytes: 2 %
Lymphocytes Relative: 10 %
Lymphs Abs: 0.7 10*3/uL (ref 0.7–4.0)
MCH: 28.6 pg (ref 26.0–34.0)
MCHC: 29.5 g/dL — ABNORMAL LOW (ref 30.0–36.0)
MCV: 97 fL (ref 80.0–100.0)
Monocytes Absolute: 0.7 10*3/uL (ref 0.1–1.0)
Monocytes Relative: 10 %
Neutro Abs: 5.5 10*3/uL (ref 1.7–7.7)
Neutrophils Relative %: 78 %
Platelets: 235 10*3/uL (ref 150–400)
RBC: 4.33 MIL/uL (ref 4.22–5.81)
RDW: 16.2 % — ABNORMAL HIGH (ref 11.5–15.5)
WBC: 7 10*3/uL (ref 4.0–10.5)
nRBC: 0 % (ref 0.0–0.2)

## 2020-02-02 LAB — GLUCOSE, CAPILLARY
Glucose-Capillary: 113 mg/dL — ABNORMAL HIGH (ref 70–99)
Glucose-Capillary: 125 mg/dL — ABNORMAL HIGH (ref 70–99)
Glucose-Capillary: 162 mg/dL — ABNORMAL HIGH (ref 70–99)
Glucose-Capillary: 168 mg/dL — ABNORMAL HIGH (ref 70–99)
Glucose-Capillary: 176 mg/dL — ABNORMAL HIGH (ref 70–99)
Glucose-Capillary: 179 mg/dL — ABNORMAL HIGH (ref 70–99)

## 2020-02-02 LAB — BRAIN NATRIURETIC PEPTIDE: B Natriuretic Peptide: 319.4 pg/mL — ABNORMAL HIGH (ref 0.0–100.0)

## 2020-02-02 LAB — PROCALCITONIN: Procalcitonin: <0.1 ng/mL

## 2020-02-02 MED ORDER — RESOURCE THICKENUP CLEAR PO POWD
ORAL | Status: DC | PRN
  Filled 2020-02-02: qty 125

## 2020-02-02 NOTE — Progress Notes (Signed)
SLP Cancellation Note  Patient Details Name: Ernest Clark. MRN: [id number] DOB: 06/19/1931   Cancelled treatment:       Reason Eval/Treat Not Completed: Fatigue/lethargy limiting ability to participate (Case discussed with Mickel Baas, RN who reported that the pt was given Haldol and is now lethargic. SLP will follow up later today as schedule allows.)  Hiral Lukasiewicz I. Hardin Negus, [place], [place] Office number [phone] Pager [place] 02/02/2020, 9:22 AM

## 2020-02-02 NOTE — Progress Notes (Signed)
PROGRESS NOTE                                                                                                                                                                                                             Patient Demographics:    Ernest Clark, is a 87 y.o. male, DOB - November 22, 1931, SD:[id number]  Outpatient Primary MD for the patient is Hulan Fess, MD   Admit date - 01/24/2020   LOS - 4  Chief Complaint  Patient presents with  . Covid Positive       Brief Narrative: Patient is a 87 y.o. male with PMHx of CAD s/p PCI, PAD, HTN, HLD, COPD on home O2 2 L/min, DVT-presented to the hospital with acute on chronic hypoxic and hypercarbic respiratory failure due to HCAP/bacterial and COVID-19 infection.  COVID-19 vaccinated status: Vaccinated  Significant Events:  1/27>> Admit to [facility] for acute hypoxic/hypercarbic respiratory failure-HCAP/COVID-19 infection  Significant studies: 1/27>> CTA chest: No PE, bilateral airspace opacity-right>> left   COVID-19 medications: Steroids: 1/27>> Remdesivir: 1/27>>  Antibiotics: Rocephin: 1/27>> Zithromax: 1/27>>  Microbiology data: 1/27 >>blood culture: No growth  Procedures: None  Consults: PCCM, Pall.Care  DVT prophylaxis: enoxaparin (LOVENOX) injection 40 mg Start: 01/24/2020 0900    Subjective:   Patient currently in bed, somnolent this morning after receiving Haldol earlier, unable to answer questions or follow commands.   Assessment  & Plan :   Acute on chronic hypoxemic and hypercarbic respiratory failure: Suspect this is mostly from bacterial/HCAP-likely aspiration pneumonia-could have some element of COVID-19 infection.  Seems to have improved compared to on admission-back on usual home regimen of 2 L of oxygen.  Continue empiric antimicrobial therapy/steroids and Remdesivir.  Await culture data and narrow antibiotic spectrum accordingly.    Speech  therapy following for safe diet.  Currently on nectar thick liquids with dysphagia 2 diet per speech.  O2 requirements:  SpO2: 100 % O2 Flow Rate (L/min): 3 L/min FiO2 (%): 36 %     Recent Labs  Lab 01/19/2020 0445 01/08/2020 0625 01/10/2020 0845 01/04/2020 0952 01/18/2020 1139 01/31/2020 1258 01/16/2020 1259 01/16/2020 1325 01/10/2020 1439 01/30/20 0516 01/31/20 0852 02/01/20 0049 02/02/20 0116  WBC  --  21.4*  --   --   --   --   --   --   --  9.3 11.2* 8.7  7.0  HGB  --  12.8*   < >  --   --   --   --   --  13.3 12.0* 12.6* 12.5* 12.4*  HCT  --  43.2   < >  --   --   --   --   --  39.0 39.1 42.9 42.4 42.0  PLT  --  202  --   --   --   --   --   --   --  182 220 242 235  CRP  --   --   --   --   --   --   --   --   --   --  7.6* 5.3* 3.2*  BNP  --   --   --   --   --   --   --   --   --   --  531.7* 451.7* 319.4*  DDIMER 0.95*  --   --   --   --   --   --   --   --   --  0.69* 0.48 0.59*  PROCALCITON  --   --   --   --   --   --   --   --   --   --  <0.10 <0.10 <0.10  AST  --   --   --   --   --  31  --   --   --   --   --  32 23  ALT  --   --   --   --   --  28  --   --   --   --   --  31 25  ALKPHOS  --   --   --   --   --  90  --   --   --   --   --  80 73  BILITOT  --   --   --   --   --  0.6  --   --   --   --   --  0.7 0.8  ALBUMIN  --   --   --   --   --  2.6*  --   --   --   --   --  2.7* 2.6*  LATICACIDVEN  --   --   --  1.1 1.6  --  2.2*  --   --   --   --   --   --   SARSCOV2NAA  --   --   --   --   --   --   --  POSITIVE*  --   --   --   --   --    < > = values in this interval not displayed.      Prone/Incentive Spirometry: encouraged patient to lie prone for 3-4 hours at a time for a total of 16 hours a day, and to encourage incentive spirometry use 3-4/hour.  Sepsis due to underlying COVID-19 pneumonia: Secondary to PNA-sepsis physiology has resolved.  So far cultures negative procalcitonin is undetectable hence will discontinue antibiotics on 02/01/2020.  Delirium/acute  metabolic encephalopathy superimposed on dementia: Suspect delirium/metabolic encephalopathy due to hypoxemia/pneumonia.  On home dose Cymbalta and Remeron, received Haldol early morning 02/01/2020 and extremely somnolent after that, -ve head CT .  Continue supportive care, minimize benzodiazepines and narcotics as much as possible.  Since he  is somnolent gentle hydration with IV fluids to continue, will involve palliative care as he does not seem to be improving.  CAD/PAD: All stable-continue aspirin/Crestor  COPD: Not in flare-continue bronchodilators  Hypothyroidism: Continue levothyroxine  Obesity: BMI 32.  Follow with PCP.  DM-2: CBG stable-continue SSI  Lab Results  Component Value Date   HGBA1C 7.0 (H) 01/30/2020   Recent Labs    02/01/20 2359 02/02/20 0651 02/02/20 0816  GLUCAP 168* 113* 125*     Condition - Stable  Family Communication  :  Son- [phone] left voicemail on 01/30/20, left voicemail on 01/31/2020 at 9:27 AM, updated again 02/02/2020 at 11:10 AM  Code Status :  DNR  Diet :  Diet Order            DIET DYS 2 Room service appropriate? No; Fluid consistency: Nectar Thick  Diet effective now                  Disposition Plan  :   Status is: Inpatient  Remains inpatient appropriate because:Inpatient level of care appropriate due to severity of illness   Dispo: The patient is from: SNF              Anticipated d/c is to: SNF              Anticipated d/c date is: > 3 days              Patient currently is not medically stable to d/c.   Difficult to place patient No  Barriers to discharge: Hypoxia requiring O2 supplementation/complete 5 days of IV Remdesivir  Antimicorbials  :    Anti-infectives (From admission, onward)   Start     Dose/Rate Route Frequency Ordered Stop   01/30/20 1000  remdesivir 100 mg in sodium chloride 0.9 % 100 mL IVPB       "Followed by" Linked Group Details   100 mg 200 mL/hr over 30 Minutes Intravenous Daily 01/19/2020  1105 02/02/20 0942   01/22/2020 1200  remdesivir 200 mg in sodium chloride 0.9% 250 mL IVPB       "Followed by" Linked Group Details   200 mg 580 mL/hr over 30 Minutes Intravenous Once 01/17/2020 1105 01/28/2020 1405   01/18/2020 0830  cefTRIAXone (ROCEPHIN) 2 g in sodium chloride 0.9 % 100 mL IVPB  Status:  Discontinued        2 g 200 mL/hr over 30 Minutes Intravenous Every 24 hours 01/06/2020 0805 02/01/20 1133   01/07/2020 0815  azithromycin (ZITHROMAX) 500 mg in sodium chloride 0.9 % 250 mL IVPB  Status:  Discontinued        500 mg 250 mL/hr over 60 Minutes Intravenous Every 24 hours 01/22/2020 0805 02/01/20 1133   01/21/2020 0700  ceFEPIme (MAXIPIME) 2 g in sodium chloride 0.9 % 100 mL IVPB  Status:  Discontinued        2 g 200 mL/hr over 30 Minutes Intravenous Every 8 hours 01/12/2020 0656 02/01/2020 0805      Inpatient Medications  Scheduled Meds: . aspirin EC  81 mg Oral Daily  . DULoxetine  40 mg Oral Daily  . enoxaparin (LOVENOX) injection  40 mg Subcutaneous Q24H  . Ipratropium-Albuterol  1 puff Inhalation TID  . levothyroxine  50 mcg Oral QAC breakfast  . methylPREDNISolone (SOLU-MEDROL) injection  40 mg Intravenous Daily  . pantoprazole  40 mg Oral Q1200  . rosuvastatin  40 mg Oral Daily  . umeclidinium-vilanterol  1 puff Inhalation Daily  Continuous Infusions: . dextrose 5 % and 0.45 % NaCl with KCl 10 mEq/L 50 mL/hr at 02/01/20 1340   PRN Meds:.haloperidol lactate, Ipratropium-Albuterol, Resource ThickenUp Clear   Time Spent in minutes  25  See all Orders from today for further details   Lala Lund M.D on 02/02/2020 at 11:09 AM  To page go to [email or web address] - use universal password  [facility] -  Office  [phone]    Objective:   Vitals:   02/01/20 2000 02/01/20 2200 02/02/20 0100 02/02/20 0651  BP:    125/62  Pulse:  90  75  Resp:    16  Temp:      TempSrc:      SpO2: 98% 99% 99% 100%  Weight:        Wt Readings from Last 3 Encounters:  09-Feb-2020  95.7 kg  09/17/19 95.7 kg  07/22/19 103 kg     Intake/Output Summary (Last 24 hours) at 02/02/2020 1109 Last data filed at 02/01/2020 2030 Gross per 24 hour  Intake 200.59 ml  Output 400 ml  Net -199.41 ml     Physical Exam  Somnolent after receiving Haldol early this morning, unable to answer questions or follow commands, appears extremely weak [place].AT,PERRAL Supple Neck,No JVD, No cervical lymphadenopathy appriciated.  Symmetrical Chest wall movement, Good air movement bilaterally, CTAB RRR,No Gallops, Rubs or new Murmurs, No Parasternal Heave +ve B.Sounds, Abd Soft, No tenderness, No organomegaly appriciated, No rebound - guarding or rigidity. No Cyanosis, Clubbing or edema, No new Rash or bruise     Data Review:    CBC Recent Labs  Lab 02-09-20 0625 2020/02/09 0845 2020/02/09 1439 01/30/20 0516 01/31/20 0852 02/01/20 0049 02/02/20 0116  WBC 21.4*  --   --  9.3 11.2* 8.7 7.0  HGB 12.8*   < > 13.3 12.0* 12.6* 12.5* 12.4*  HCT 43.2   < > 39.0 39.1 42.9 42.4 42.0  PLT 202  --   --  182 220 242 235  MCV 98.0  --   --  97.5 97.9 97.0 97.0  MCH 29.0  --   --  29.9 28.8 28.6 28.6  MCHC 29.6*  --   --  30.7 29.4* 29.5* 29.5*  RDW 16.5*  --   --  16.5* 16.4* 16.2* 16.2*  LYMPHSABS 0.7  --   --  0.5*  --  0.6* 0.7  MONOABS 1.3*  --   --  0.2  --  0.7 0.7  EOSABS 0.1  --   --  0.0  --  0.0 0.0  BASOSABS 0.0  --   --  0.0  --  0.0 0.0   < > = values in this interval not displayed.    Chemistries  Recent Labs  Lab 2020-02-09 0445 2020/02/09 0845 02/09/20 1258 02-09-2020 1439 01/31/20 0852 02/01/20 0049 02/02/20 0116  NA 137 136  --  135 141 140 142  K 4.7 4.2  --  4.4 4.1 3.7 3.7  CL 95*  --   --   --  97* 95* 98  CO2 33*  --   --   --  31 34* 35*  GLUCOSE 187*  --   --   --  187* 161* 169*  BUN 13  --   --   --  17 17 17   CREATININE 0.84  --   --   --  0.78 0.85 0.86  CALCIUM 8.8*  --   --   --  8.8* 8.9 8.7*  MG  --   --   --   --  2.2 2.2 2.2  AST  --   --  31  --    --  32 23  ALT  --   --  28  --   --  31 25  ALKPHOS  --   --  90  --   --  80 73  BILITOT  --   --  0.6  --   --  0.7 0.8   ------------------------------------------------------------------------------------------------------------------ No results for input(s): CHOL, HDL, LDLCALC, TRIG, CHOLHDL, LDLDIRECT in the last 72 hours.  Lab Results  Component Value Date   HGBA1C 7.0 (H) 01/30/2020   ------------------------------------------------------------------------------------------------------------------ No results for input(s): TSH, T4TOTAL, T3FREE, THYROIDAB in the last 72 hours.  Invalid input(s): FREET3 ------------------------------------------------------------------------------------------------------------------ No results for input(s): VITAMINB12, FOLATE, FERRITIN, TIBC, IRON, RETICCTPCT in the last 72 hours.  Coagulation profile No results for input(s): INR, PROTIME in the last 168 hours.  Recent Labs    02/01/20 0049 02/02/20 0116  DDIMER 0.48 0.59*    Cardiac Enzymes No results for input(s): CKMB, TROPONINI, MYOGLOBIN in the last 168 hours.  Invalid input(s): CK ------------------------------------------------------------------------------------------------------------------    Component Value Date/Time   BNP 319.4 (H) 02/02/2020 0116    Micro Results Recent Results (from the past 240 hour(s))  Culture, blood (x 2)     Status: None (Preliminary result)   Collection Time: 01/24/2020  8:00 AM   Specimen: BLOOD RIGHT FOREARM  Result Value Ref Range Status   Specimen Description BLOOD RIGHT FOREARM  Final   Special Requests   Final    BOTTLES DRAWN AEROBIC AND ANAEROBIC Blood Culture results may not be optimal due to an excessive volume of blood received in culture bottles   Culture   Final    NO GROWTH 3 DAYS Performed at [facility], [place] [address], [place], [place] [address]    Report Status PENDING  Incomplete  Culture, blood (x 2)     Status:  None (Preliminary result)   Collection Time: 01/23/2020 12:58 PM   Specimen: BLOOD  Result Value Ref Range Status   Specimen Description BLOOD LEFT ANTECUBITAL  Final   Special Requests   Final    BOTTLES DRAWN AEROBIC AND ANAEROBIC Blood Culture adequate volume   Culture   Final    NO GROWTH 3 DAYS Performed at [facility], [place] [address], [place], [place] [address]    Report Status PENDING  Incomplete  SARS CORONAVIRUS 2 (TAT 6-24 HRS) Nasopharyngeal Nasopharyngeal Swab     Status: Abnormal   Collection Time: 02/02/2020  1:25 PM   Specimen: Nasopharyngeal Swab  Result Value Ref Range Status   SARS Coronavirus 2 POSITIVE (A) NEGATIVE Final    Comment: (NOTE) SARS-CoV-2 target nucleic acids are DETECTED.  The SARS-CoV-2 RNA is generally detectable in upper and lower respiratory specimens during the acute phase of infection. Positive results are indicative of the presence of SARS-CoV-2 RNA. Clinical correlation with patient history and other diagnostic information is  necessary to determine patient infection status. Positive results do not rule out bacterial infection or co-infection with other viruses.  The expected result is Negative.  Fact Sheet for Patients: SugarRoll.be  Fact Sheet for Healthcare Providers: https://www.woods-mathews.com/  This test is not yet approved or cleared by the [place] FDA and  has been authorized for detection and/or diagnosis of SARS-CoV-2 by FDA under an Emergency Use Authorization (EUA). This EUA will remain  in effect (meaning this test can be used) for the duration of the COVID-19 declaration under Section 564(b)(1) of the Act, 21 U. S.C. section 360bbb-3(b)(1), unless the authorization is terminated or revoked sooner.   Performed at [facility], [place] [address], [place], [place] [address]     Radiology Reports CT HEAD WO CONTRAST  Result Date: 02/01/2020 CLINICAL DATA:   Delirium EXAM: CT HEAD WITHOUT CONTRAST TECHNIQUE: Contiguous axial images were obtained from the base of the skull through the vertex without intravenous contrast. COMPARISON:  CT head 07/14/2019 FINDINGS: Brain: Image quality degraded by extensive motion Generalized atrophy.  No acute infarct, hemorrhage, mass Vascular: Carotid and vertebral artery calcification. Negative for hyperdense vessel Skull: Negative Sinuses/Orbits: Mucosal edema paranasal sinuses. Bilateral cataract extraction Other: None IMPRESSION: Image quality degraded by extensive motion. No acute abnormality identified Electronically Signed   By: Franchot Gallo M.D.   On: 02/01/2020 14:56   CT Angio Chest PE W and/or Wo Contrast  Result Date: 01/03/2020 CLINICAL DATA:  Positive D-dimer and COVID-19 pneumonia, initial encounter EXAM: CT ANGIOGRAPHY CHEST WITH CONTRAST TECHNIQUE: Multidetector CT imaging of the chest was performed using the standard protocol during bolus administration of intravenous contrast. Multiplanar CT image reconstructions and MIPs were obtained to evaluate the vascular anatomy. CONTRAST:  54mL OMNIPAQUE IOHEXOL 350 MG/ML SOLN COMPARISON:  Chest x-ray from earlier in the same day, CT from 07/03/2017 FINDINGS: Cardiovascular: Thoracic aorta and its branches demonstrate atherosclerotic calcifications. No aneurysmal dilatation is seen. The degree of opacification of the aorta is limited. No cardiac enlargement is seen. Mitral and aortic valve calcifications are seen. Heavy coronary calcifications are noted. The pulmonary artery shows a normal branching pattern. No filling defect to suggest pulmonary embolism is noted. Mediastinum/Nodes: Thoracic inlet is within normal limits. No sizable hilar or mediastinal adenopathy is noted. Small precarinal nodes are seen. The esophagus as visualized is within normal limits. Lungs/Pleura: Lungs are well aerated bilaterally with patchy airspace opacities primarily in the lower lobes with  some volume loss on the right. No sizable effusion or pneumothorax is seen. No parenchymal nodules are noted. Upper Abdomen: Visualized upper abdomen is within normal limits. Musculoskeletal: Degenerative changes of the thoracic spine are noted. No acute rib abnormality is seen. Review of the MIP images confirms the above findings. IMPRESSION: No evidence of pulmonary emboli. Bilateral airspace opacity slightly worse on the right than the left similar to that seen on prior chest x-ray consistent with the given clinical history. Aortic Atherosclerosis (ICD10-I70.0). Electronically Signed   By: Inez Catalina M.D.   On: 01/27/2020 10:58   DG Chest Port 1 View  Result Date: 01/31/2020 CLINICAL DATA:  Shortness of breath.  COVID positive. EXAM: PORTABLE CHEST 1 VIEW COMPARISON:  Chest x-rays dated 01/26/2020 and 07/20/2019. Chest CT dated 01/28/2020. FINDINGS: Bilateral airspace opacities, RIGHT greater than LEFT, not significantly changed. Heart size and mediastinal contours appear stable. No pleural effusion or pneumothorax is seen. IMPRESSION: Stable bilateral multifocal pneumonia, RIGHT greater than LEFT. Electronically Signed   By: Franki Cabot M.D.   On: 01/31/2020 09:04   DG Chest Port 1 View  Result Date: 01/28/2020 CLINICAL DATA:  COVID pneumonia, dyspnea EXAM: PORTABLE CHEST 1 VIEW COMPARISON:  07/20/2019 FINDINGS: The lungs are symmetrically inflated. Elevation of the left hemidiaphragm is again noted. There is right mid and lower lung zone consolidation, new since prior examination, possibly infectious in the acute setting. Note that aspiration or pulmonary hemorrhage could appear similarly. Mild left basilar atelectasis  or infiltrate noted. No pneumothorax or pleural effusion. Mild cardiomegaly is unchanged. IMPRESSION: Right mid and lower lung zone consolidation, in keeping with acute lobar pneumonia in the appropriate clinical setting. Probable left basilar atelectasis with associated stable  elevation of the left hemidiaphragm. Electronically Signed   By: Fidela Salisbury MD   On: 01/27/2020 05:06

## 2020-02-02 NOTE — Progress Notes (Signed)
  Speech Language Pathology Treatment: Dysphagia  Patient Details Name: Ernest Clark. MRN: [id number] DOB: [birth year] Today's Date: 02/02/2020 Time: 0737-1062 SLP Time Calculation (min) (ACUTE ONLY): 11 min  Assessment / Plan / Recommendation Clinical Impression  Pt was seen for dysphagia treatment. He was alert and cooperative during the session, but did not communicate verbally despite cueing and encouragement. No s/sx of aspiration were noted with any solids or liquids during this session. Mastication continues to be prolonged with more advanced solids, but adequate for dysphagia 2 solids. Mild residue was noted and cleared with liquid washes. Pt's diet will be advanced to dysphagia 2 solids with thin liquids. SLP will follow to assess tolerance.    HPI HPI: Pt is an 87 y.o. male admitted on 2020/02/22 for drop in O2 sats.  Pt dx with COVID 19 01/24/20.  Pt with significant PMH of PAD, HTN, dementia, DVT, gout, DM, COPD (on 2 L O2 [place] at baseline). CT chest 1/27: Bilateral airspace opacity slightly worse on the right than the left similar to that seen on prior chest x-ray consistent with the given clinical history. CT head 1/30 negative. CXR 1/29: Stable bilateral multifocal pneumonia, RIGHT greater than LEFT.      SLP Plan  Continue with current plan of care       Recommendations  Diet recommendations: Dysphagia 2 (fine chop);Thin liquid Liquids provided via: Straw;Cup Medication Administration: Crushed with puree Supervision: Staff to assist with self feeding;Intermittent supervision to cue for compensatory strategies Compensations: Small sips/bites;Slow rate                Oral Care Recommendations: Oral care BID Follow up Recommendations:  (TBD) SLP Visit Diagnosis: Dysphagia, unspecified (R13.10) Plan: Continue with current plan of care       [place] I. Hardin Negus, [place], [place] Office number [phone] Pager [place] 02/02/2020, 1:58 PM

## 2020-02-02 NOTE — Progress Notes (Signed)
Pt pulled out IV, O2 Tubing and EKG leads. Pt is disoriented and pulling on linings.

## 2020-02-03 DIAGNOSIS — J189 Pneumonia, unspecified organism: Secondary | ICD-10-CM | POA: Diagnosis not present

## 2020-02-03 DIAGNOSIS — U071 COVID-19: Secondary | ICD-10-CM | POA: Diagnosis not present

## 2020-02-03 DIAGNOSIS — Z515 Encounter for palliative care: Secondary | ICD-10-CM | POA: Diagnosis not present

## 2020-02-03 DIAGNOSIS — J9601 Acute respiratory failure with hypoxia: Secondary | ICD-10-CM | POA: Diagnosis not present

## 2020-02-03 DIAGNOSIS — Z7189 Other specified counseling: Secondary | ICD-10-CM

## 2020-02-03 DIAGNOSIS — J9602 Acute respiratory failure with hypercapnia: Secondary | ICD-10-CM | POA: Diagnosis not present

## 2020-02-03 LAB — CBC WITH DIFFERENTIAL/PLATELET
Abs Immature Granulocytes: 0.29 10*3/uL — ABNORMAL HIGH (ref 0.00–0.07)
Basophils Absolute: 0.1 10*3/uL (ref 0.0–0.1)
Basophils Relative: 1 %
Eosinophils Absolute: 0 10*3/uL (ref 0.0–0.5)
Eosinophils Relative: 0 %
HCT: 42.5 % (ref 39.0–52.0)
Hemoglobin: 12.7 g/dL — ABNORMAL LOW (ref 13.0–17.0)
Immature Granulocytes: 4 %
Lymphocytes Relative: 14 %
Lymphs Abs: 1 10*3/uL (ref 0.7–4.0)
MCH: 29.5 pg (ref 26.0–34.0)
MCHC: 29.9 g/dL — ABNORMAL LOW (ref 30.0–36.0)
MCV: 98.8 fL (ref 80.0–100.0)
Monocytes Absolute: 0.7 10*3/uL (ref 0.1–1.0)
Monocytes Relative: 10 %
Neutro Abs: 5.2 10*3/uL (ref 1.7–7.7)
Neutrophils Relative %: 71 %
Platelets: 250 10*3/uL (ref 150–400)
RBC: 4.3 MIL/uL (ref 4.22–5.81)
RDW: 16.6 % — ABNORMAL HIGH (ref 11.5–15.5)
WBC: 7.3 10*3/uL (ref 4.0–10.5)
nRBC: 0 % (ref 0.0–0.2)

## 2020-02-03 LAB — GLUCOSE, CAPILLARY
Glucose-Capillary: 176 mg/dL — ABNORMAL HIGH (ref 70–99)
Glucose-Capillary: 128 mg/dL — ABNORMAL HIGH (ref 70–99)
Glucose-Capillary: 133 mg/dL — ABNORMAL HIGH (ref 70–99)
Glucose-Capillary: 153 mg/dL — ABNORMAL HIGH (ref 70–99)
Glucose-Capillary: 160 mg/dL — ABNORMAL HIGH (ref 70–99)
Glucose-Capillary: 168 mg/dL — ABNORMAL HIGH (ref 70–99)

## 2020-02-03 LAB — COMPREHENSIVE METABOLIC PANEL
ALT: 23 U/L (ref 0–44)
AST: 18 U/L (ref 15–41)
Albumin: 2.7 g/dL — ABNORMAL LOW (ref 3.5–5.0)
Alkaline Phosphatase: 76 U/L (ref 38–126)
Anion gap: 7 (ref 5–15)
BUN: 18 mg/dL (ref 8–23)
CO2: 40 mmol/L — ABNORMAL HIGH (ref 22–32)
Calcium: 8.7 mg/dL — ABNORMAL LOW (ref 8.9–10.3)
Chloride: 97 mmol/L — ABNORMAL LOW (ref 98–111)
Creatinine, Ser: 1.02 mg/dL (ref 0.61–1.24)
GFR, Estimated: >60 mL/min (ref >60)
Glucose, Bld: 173 mg/dL — ABNORMAL HIGH (ref 70–99)
Potassium: 3.9 mmol/L (ref 3.5–5.1)
Sodium: 144 mmol/L (ref 135–145)
Total Bilirubin: 1 mg/dL (ref 0.3–1.2)
Total Protein: 5.8 g/dL — ABNORMAL LOW (ref 6.5–8.1)

## 2020-02-03 LAB — CULTURE, BLOOD (ROUTINE X 2)
Culture: NO GROWTH
Culture: NO GROWTH
Special Requests: ADEQUATE

## 2020-02-03 LAB — C-REACTIVE PROTEIN: CRP: 2 mg/dL — ABNORMAL HIGH (ref <1.0)

## 2020-02-03 LAB — MAGNESIUM: Magnesium: 2.4 mg/dL (ref 1.7–2.4)

## 2020-02-03 LAB — BRAIN NATRIURETIC PEPTIDE: B Natriuretic Peptide: 165.4 pg/mL — ABNORMAL HIGH (ref 0.0–100.0)

## 2020-02-03 LAB — D-DIMER, QUANTITATIVE: D-Dimer, Quant: 0.57 ug/mL-FEU — ABNORMAL HIGH (ref 0.00–0.50)

## 2020-02-03 LAB — PROCALCITONIN: Procalcitonin: <0.1 ng/mL

## 2020-02-03 NOTE — Progress Notes (Signed)
PROGRESS NOTE                                                                                                                                                                                                             Patient Demographics:    Ernest Clark, is a 87 y.o. male, DOB - [birth year], SD:[id number]  Outpatient Primary MD for the patient is Hulan Fess, MD   Admit date - 01/31/2020   LOS - 5  Chief Complaint  Patient presents with  . Covid Positive       Brief Narrative: Patient is a 87 y.o. male with PMHx of CAD s/p PCI, PAD, HTN, HLD, COPD on home O2 2 L/min, DVT-presented to the hospital with acute on chronic hypoxic and hypercarbic respiratory failure due to HCAP/bacterial and COVID-19 infection.  COVID-19 vaccinated status: Vaccinated  Significant Events:  1/27>> Admit to [facility] for acute hypoxic/hypercarbic respiratory failure-HCAP/COVID-19 infection  Significant studies: 1/27>> CTA chest: No PE, bilateral airspace opacity-right>> left   COVID-19 medications: Steroids: 1/27>> Remdesivir: 1/27>>  Antibiotics: Rocephin: 1/27>> Zithromax: 1/27>>  Microbiology data: 1/27 >>blood culture: No growth  Procedures: None  Consults: PCCM, Pall.Care  DVT prophylaxis: enoxaparin (LOVENOX) injection 40 mg Start: 01/12/2020 0900    Subjective:   Patient in bed appears quite weak and lethargic but slightly improved as compared to last few days, he is able to answer basic questions and denies any headache chest or abdominal pain, follows basic commands.   Assessment  & Plan :   Acute on chronic hypoxemic and hypercarbic respiratory failure: Suspect this is mostly from bacterial/HCAP-likely aspiration pneumonia-could have some element of COVID-19 infection.  Seems to have improved compared to on admission-back on usual home regimen of 2 L of oxygen.  Continue empiric antimicrobial therapy/steroids and Remdesivir.   Await culture data and narrow antibiotic spectrum accordingly.    Speech therapy following for safe diet.  Currently on nectar thick liquids with dysphagia 2 diet per speech.  O2 requirements:  SpO2: 100 % O2 Flow Rate (L/min): 3 L/min FiO2 (%): 36 %     Recent Labs  Lab 01/23/2020 0445 02/01/2020 0625 01/18/2020 0952 01/22/2020 1139 01/16/2020 1258 02/02/2020 1259 01/23/2020 1325 01/06/2020 1439 01/30/20 0516 01/31/20 0852 02/01/20 0049 02/02/20 0116 02/03/20 0222  WBC  --    < >  --   --   --   --   --   --  9.3 11.2* 8.7 7.0 7.3  HGB  --    < >  --   --   --   --   --    < > 12.0* 12.6* 12.5* 12.4* 12.7*  HCT  --    < >  --   --   --   --   --    < > 39.1 42.9 42.4 42.0 42.5  PLT  --    < >  --   --   --   --   --   --  182 220 242 235 250  CRP  --   --   --   --   --   --   --   --   --  7.6* 5.3* 3.2* 2.0*  BNP  --   --   --   --   --   --   --   --   --  531.7* 451.7* 319.4* 165.4*  DDIMER 0.95*  --   --   --   --   --   --   --   --  0.69* 0.48 0.59* 0.57*  PROCALCITON  --   --   --   --   --   --   --   --   --  <0.10 <0.10 <0.10 <0.10  AST  --   --   --   --  31  --   --   --   --   --  32 23 18  ALT  --   --   --   --  28  --   --   --   --   --  31 25 23   ALKPHOS  --   --   --   --  90  --   --   --   --   --  80 73 76  BILITOT  --   --   --   --  0.6  --   --   --   --   --  0.7 0.8 1.0  ALBUMIN  --   --   --   --  2.6*  --   --   --   --   --  2.7* 2.6* 2.7*  LATICACIDVEN  --   --  1.1 1.6  --  2.2*  --   --   --   --   --   --   --   SARSCOV2NAA  --   --   --   --   --   --  POSITIVE*  --   --   --   --   --   --    < > = values in this interval not displayed.      Prone/Incentive Spirometry: encouraged patient to lie prone for 3-4 hours at a time for a total of 16 hours a day, and to encourage incentive spirometry use 3-4/hour.  Sepsis due to underlying COVID-19 pneumonia: Secondary to PNA-sepsis physiology has resolved.  So far cultures negative procalcitonin is  undetectable hence will discontinue antibiotics on 02/01/2020.  Delirium/acute metabolic encephalopathy superimposed on dementia: Suspect delirium/metabolic encephalopathy due to hypoxemia/pneumonia.  On home dose Cymbalta and Remeron, received Haldol early morning 02/01/2020 and extremely somnolent after that, -ve head CT .  Continue supportive care, minimize benzodiazepines and narcotics as much as possible.  Since he is somnolent gentle hydration with IV fluids  to continue, will involve palliative care as he does not seem to be improving.  CAD/PAD: All stable-continue aspirin/Crestor  COPD: Not in flare-continue bronchodilators  Hypothyroidism: Continue levothyroxine  Obesity: BMI 32.  Follow with PCP.  DM-2: CBG stable-continue SSI  Lab Results  Component Value Date   HGBA1C 7.0 (H) 01/30/2020   Recent Labs    02/02/20 2320 02/03/20 0404 02/03/20 0828  GLUCAP 176* 176* 128*     Condition - Stable  Family Communication  :  Son- [phone] left voicemail on 01/30/20, left voicemail on 01/31/2020 at 9:27 AM, updated again 02/02/2020 at 11:10 AM  Code Status :  DNR  Diet :  Diet Order            DIET DYS 2 Room service appropriate? No; Fluid consistency: Thin  Diet effective now                  Disposition Plan  :   Status is: Inpatient  Remains inpatient appropriate because:Inpatient level of care appropriate due to severity of illness   Dispo: The patient is from: SNF              Anticipated d/c is to: SNF              Anticipated d/c date is: > 3 days              Patient currently is not medically stable to d/c.   Difficult to place patient No  Barriers to discharge: Hypoxia requiring O2 supplementation/complete 5 days of IV Remdesivir  Antimicorbials  :    Anti-infectives (From admission, onward)   Start     Dose/Rate Route Frequency Ordered Stop   01/30/20 1000  remdesivir 100 mg in sodium chloride 0.9 % 100 mL IVPB       "Followed by" Linked Group  Details   100 mg 200 mL/hr over 30 Minutes Intravenous Daily 01/18/2020 1105 02/02/20 0942   01/19/2020 1200  remdesivir 200 mg in sodium chloride 0.9% 250 mL IVPB       "Followed by" Linked Group Details   200 mg 580 mL/hr over 30 Minutes Intravenous Once 01/13/2020 1105 01/05/2020 1405   01/05/2020 0830  cefTRIAXone (ROCEPHIN) 2 g in sodium chloride 0.9 % 100 mL IVPB  Status:  Discontinued        2 g 200 mL/hr over 30 Minutes Intravenous Every 24 hours 01/28/2020 0805 02/01/20 1133   02/02/2020 0815  azithromycin (ZITHROMAX) 500 mg in sodium chloride 0.9 % 250 mL IVPB  Status:  Discontinued        500 mg 250 mL/hr over 60 Minutes Intravenous Every 24 hours 01/24/2020 0805 02/01/20 1133   01/11/2020 0700  ceFEPIme (MAXIPIME) 2 g in sodium chloride 0.9 % 100 mL IVPB  Status:  Discontinued        2 g 200 mL/hr over 30 Minutes Intravenous Every 8 hours 01/22/2020 0656 01/19/2020 0805      Inpatient Medications  Scheduled Meds: . aspirin EC  81 mg Oral Daily  . DULoxetine  40 mg Oral Daily  . enoxaparin (LOVENOX) injection  40 mg Subcutaneous Q24H  . levothyroxine  50 mcg Oral QAC breakfast  . methylPREDNISolone (SOLU-MEDROL) injection  40 mg Intravenous Daily  . pantoprazole  40 mg Oral Q1200  . rosuvastatin  40 mg Oral Daily  . umeclidinium-vilanterol  1 puff Inhalation Daily   Continuous Infusions: . dextrose 5 % and 0.45 % NaCl with KCl 10 mEq/L  50 mL/hr at 02/03/20 0130   PRN Meds:.haloperidol lactate, Ipratropium-Albuterol, Resource ThickenUp Clear   Time Spent in minutes  25  See all Orders from today for further details   Lala Lund M.D on 02/03/2020 at 10:50 AM  To page go to [email or web address] - use universal password  [facility] -  Office  [phone]    Objective:   Vitals:   02/02/20 0100 02/02/20 0651 02/02/20 1457 02/02/20 2157  BP:  125/62 (!) 145/85 (!) 155/78  Pulse:  75 (!) 101 98  Resp:  16 16   Temp:    (!) 96.5 F (35.8 C)  TempSrc:      SpO2: 99% 100%  100% 100%  Weight:        Wt Readings from Last 3 Encounters:  01/13/2020 95.7 kg  09/17/19 95.7 kg  07/22/19 103 kg     Intake/Output Summary (Last 24 hours) at 02/03/2020 1050 Last data filed at 02/03/2020 0029 Gross per 24 hour  Intake -  Output 900 ml  Net -900 ml     Physical Exam  Awake and slightly more alert than the last few days, still overall weak and lethargic, appears frail [place].AT,PERRAL Supple Neck,No JVD, No cervical lymphadenopathy appriciated.  Symmetrical Chest wall movement, Good air movement bilaterally, CTAB RRR,No Gallops, Rubs or new Murmurs, No Parasternal Heave +ve B.Sounds, Abd Soft, No tenderness, No organomegaly appriciated, No rebound - guarding or rigidity. No Cyanosis, Clubbing or edema, No new Rash or bruise      Data Review:    CBC Recent Labs  Lab 01/20/2020 0625 01/17/2020 0845 01/30/20 0516 01/31/20 0852 02/01/20 0049 02/02/20 0116 02/03/20 0222  WBC 21.4*  --  9.3 11.2* 8.7 7.0 7.3  HGB 12.8*   < > 12.0* 12.6* 12.5* 12.4* 12.7*  HCT 43.2   < > 39.1 42.9 42.4 42.0 42.5  PLT 202  --  182 220 242 235 250  MCV 98.0  --  97.5 97.9 97.0 97.0 98.8  MCH 29.0  --  29.9 28.8 28.6 28.6 29.5  MCHC 29.6*  --  30.7 29.4* 29.5* 29.5* 29.9*  RDW 16.5*  --  16.5* 16.4* 16.2* 16.2* 16.6*  LYMPHSABS 0.7  --  0.5*  --  0.6* 0.7 1.0  MONOABS 1.3*  --  0.2  --  0.7 0.7 0.7  EOSABS 0.1  --  0.0  --  0.0 0.0 0.0  BASOSABS 0.0  --  0.0  --  0.0 0.0 0.1   < > = values in this interval not displayed.    Chemistries  Recent Labs  Lab 01/11/2020 0445 01/10/2020 0845 01/15/2020 1258 01/11/2020 1439 01/31/20 0852 02/01/20 0049 02/02/20 0116 02/03/20 0222  NA 137   < >  --  135 141 140 142 144  K 4.7   < >  --  4.4 4.1 3.7 3.7 3.9  CL 95*  --   --   --  97* 95* 98 97*  CO2 33*  --   --   --  31 34* 35* 40*  GLUCOSE 187*  --   --   --  187* 161* 169* 173*  BUN 13  --   --   --  17 17 17 18   CREATININE 0.84  --   --   --  0.78 0.85 0.86 1.02  CALCIUM  8.8*  --   --   --  8.8* 8.9 8.7* 8.7*  MG  --   --   --   --  2.2 2.2 2.2 2.4  AST  --   --  31  --   --  32 23 18  ALT  --   --  28  --   --  31 25 23   ALKPHOS  --   --  90  --   --  80 73 76  BILITOT  --   --  0.6  --   --  0.7 0.8 1.0   < > = values in this interval not displayed.   ------------------------------------------------------------------------------------------------------------------ No results for input(s): CHOL, HDL, LDLCALC, TRIG, CHOLHDL, LDLDIRECT in the last 72 hours.  Lab Results  Component Value Date   HGBA1C 7.0 (H) 01/30/2020   ------------------------------------------------------------------------------------------------------------------ No results for input(s): TSH, T4TOTAL, T3FREE, THYROIDAB in the last 72 hours.  Invalid input(s): FREET3 ------------------------------------------------------------------------------------------------------------------ No results for input(s): VITAMINB12, FOLATE, FERRITIN, TIBC, IRON, RETICCTPCT in the last 72 hours.  Coagulation profile No results for input(s): INR, PROTIME in the last 168 hours.  Recent Labs    02/02/20 0116 02/03/20 0222  DDIMER 0.59* 0.57*    Cardiac Enzymes No results for input(s): CKMB, TROPONINI, MYOGLOBIN in the last 168 hours.  Invalid input(s): CK ------------------------------------------------------------------------------------------------------------------    Component Value Date/Time   BNP 165.4 (H) 02/03/2020 0222    Micro Results Recent Results (from the past 240 hour(s))  Culture, blood (x 2)     Status: None (Preliminary result)   Collection Time: 01/21/2020  8:00 AM   Specimen: BLOOD RIGHT FOREARM  Result Value Ref Range Status   Specimen Description BLOOD RIGHT FOREARM  Final   Special Requests   Final    BOTTLES DRAWN AEROBIC AND ANAEROBIC Blood Culture results may not be optimal due to an excessive volume of blood received in culture bottles   Culture   Final    NO  GROWTH 4 DAYS Performed at [facility], [place] [address] [place], [place] [address]    Report Status PENDING  Incomplete  Culture, blood (x 2)     Status: None (Preliminary result)   Collection Time: 01/13/2020 12:58 PM   Specimen: BLOOD  Result Value Ref Range Status   Specimen Description BLOOD LEFT ANTECUBITAL  Final   Special Requests   Final    BOTTLES DRAWN AEROBIC AND ANAEROBIC Blood Culture adequate volume   Culture   Final    NO GROWTH 4 DAYS Performed at [facility], [place] [address] [place], [place] [address]    Report Status PENDING  Incomplete  SARS CORONAVIRUS 2 (TAT 6-24 HRS) Nasopharyngeal Nasopharyngeal Swab     Status: Abnormal   Collection Time: 01/25/2020  1:25 PM   Specimen: Nasopharyngeal Swab  Result Value Ref Range Status   SARS Coronavirus 2 POSITIVE (A) NEGATIVE Final    Comment: (NOTE) SARS-CoV-2 target nucleic acids are DETECTED.  The SARS-CoV-2 RNA is generally detectable in upper and lower respiratory specimens during the acute phase of infection. Positive results are indicative of the presence of SARS-CoV-2 RNA. Clinical correlation with patient history and other diagnostic information is  necessary to determine patient infection status. Positive results do not rule out bacterial infection or co-infection with other viruses.  The expected result is Negative.  Fact Sheet for Patients: SugarRoll.be  Fact Sheet for Healthcare Providers: https://www.woods-mathews.com/  This test is not yet approved or cleared by the [place] FDA and  has been authorized for detection and/or diagnosis of SARS-CoV-2 by FDA under an Emergency Use Authorization (EUA). This EUA will  remain  in effect (meaning this test can be used) for the duration of the COVID-19 declaration under Section 564(b)(1) of the Act, 21 U. S.C. section 360bbb-3(b)(1), unless the authorization is terminated or revoked  sooner.   Performed at [facility], [place] [address] [place], [place] [address]     Radiology Reports CT HEAD WO CONTRAST  Result Date: 02/01/2020 CLINICAL DATA:  Delirium EXAM: CT HEAD WITHOUT CONTRAST TECHNIQUE: Contiguous axial images were obtained from the base of the skull through the vertex without intravenous contrast. COMPARISON:  CT head 07/14/2019 FINDINGS: Brain: Image quality degraded by extensive motion Generalized atrophy.  No acute infarct, hemorrhage, mass Vascular: Carotid and vertebral artery calcification. Negative for hyperdense vessel Skull: Negative Sinuses/Orbits: Mucosal edema paranasal sinuses. Bilateral cataract extraction Other: None IMPRESSION: Image quality degraded by extensive motion. No acute abnormality identified Electronically Signed   By: Franchot Gallo M.D.   On: 02/01/2020 14:56   CT Angio Chest PE W and/or Wo Contrast  Result Date: 01/25/2020 CLINICAL DATA:  Positive D-dimer and COVID-19 pneumonia, initial encounter EXAM: CT ANGIOGRAPHY CHEST WITH CONTRAST TECHNIQUE: Multidetector CT imaging of the chest was performed using the standard protocol during bolus administration of intravenous contrast. Multiplanar CT image reconstructions and MIPs were obtained to evaluate the vascular anatomy. CONTRAST:  53mL OMNIPAQUE IOHEXOL 350 MG/ML SOLN COMPARISON:  Chest x-ray from earlier in the same day, CT from 07/03/2017 FINDINGS: Cardiovascular: Thoracic aorta and its branches demonstrate atherosclerotic calcifications. No aneurysmal dilatation is seen. The degree of opacification of the aorta is limited. No cardiac enlargement is seen. Mitral and aortic valve calcifications are seen. Heavy coronary calcifications are noted. The pulmonary artery shows a normal branching pattern. No filling defect to suggest pulmonary embolism is noted. Mediastinum/Nodes: Thoracic inlet is within normal limits. No sizable hilar or mediastinal adenopathy is noted. Small precarinal  nodes are seen. The esophagus as visualized is within normal limits. Lungs/Pleura: Lungs are well aerated bilaterally with patchy airspace opacities primarily in the lower lobes with some volume loss on the right. No sizable effusion or pneumothorax is seen. No parenchymal nodules are noted. Upper Abdomen: Visualized upper abdomen is within normal limits. Musculoskeletal: Degenerative changes of the thoracic spine are noted. No acute rib abnormality is seen. Review of the MIP images confirms the above findings. IMPRESSION: No evidence of pulmonary emboli. Bilateral airspace opacity slightly worse on the right than the left similar to that seen on prior chest x-ray consistent with the given clinical history. Aortic Atherosclerosis (ICD10-I70.0). Electronically Signed   By: Inez Catalina M.D.   On: 01/08/2020 10:58   DG Chest Port 1 View  Result Date: 01/31/2020 CLINICAL DATA:  Shortness of breath.  COVID positive. EXAM: PORTABLE CHEST 1 VIEW COMPARISON:  Chest x-rays dated 01/26/2020 and 07/20/2019. Chest CT dated 01/27/2020. FINDINGS: Bilateral airspace opacities, RIGHT greater than LEFT, not significantly changed. Heart size and mediastinal contours appear stable. No pleural effusion or pneumothorax is seen. IMPRESSION: Stable bilateral multifocal pneumonia, RIGHT greater than LEFT. Electronically Signed   By: Franki Cabot M.D.   On: 01/31/2020 09:04   DG Chest Port 1 View  Result Date: 01/26/2020 CLINICAL DATA:  COVID pneumonia, dyspnea EXAM: PORTABLE CHEST 1 VIEW COMPARISON:  07/20/2019 FINDINGS: The lungs are symmetrically inflated. Elevation of the left hemidiaphragm is again noted. There is right mid and lower lung zone consolidation, new since prior examination, possibly infectious in the acute setting. Note that aspiration or pulmonary hemorrhage could appear similarly. Mild left  basilar atelectasis or infiltrate noted. No pneumothorax or pleural effusion. Mild cardiomegaly is unchanged. IMPRESSION:  Right mid and lower lung zone consolidation, in keeping with acute lobar pneumonia in the appropriate clinical setting. Probable left basilar atelectasis with associated stable elevation of the left hemidiaphragm. Electronically Signed   By: Fidela Salisbury MD   On: 01/23/2020 05:06

## 2020-02-03 NOTE — Plan of Care (Signed)
  Problem: Education: ?Goal: Knowledge of General Education information will improve ?Description: Including pain rating scale, medication(s)/side effects and non-pharmacologic comfort measures ?Outcome: Progressing ?  ?Problem: Health Behavior/Discharge Planning: ?Goal: Ability to manage health-related needs will improve ?Outcome: Progressing ?  ?Problem: Clinical Measurements: ?Goal: Ability to maintain clinical measurements within normal limits will improve ?Outcome: Progressing ?Goal: Will remain free from infection ?Outcome: Progressing ?Goal: Diagnostic test results will improve ?Outcome: Progressing ?Goal: Respiratory complications will improve ?Outcome: Progressing ?Goal: Cardiovascular complication will be avoided ?Outcome: Progressing ?  ?Problem: Coping: ?Goal: Level of anxiety will decrease ?Outcome: Progressing ?  ?Problem: Elimination: ?Goal: Will not experience complications related to bowel motility ?Outcome: Progressing ?Goal: Will not experience complications related to urinary retention ?Outcome: Progressing ?  ?Problem: Pain Managment: ?Goal: General experience of comfort will improve ?Outcome: Progressing ?  ?

## 2020-02-03 NOTE — Progress Notes (Signed)
Physical Therapy Treatment Patient Details Name: Ernest Clark. MRN: [id number] DOB: [birth year] Today's Date: 02/03/2020    History of Present Illness 87 y.o. male admitted on 2020-02-21 for drop in O2 sats.  Pt dx with COVID 19 01/24/20.  Pt with significant PMH of PAD, HTN, DVT, gout, DM, COPD (on 2 L O2 [place] at baseline).    PT Comments    Patient remains on COVID precautions in a negative-pressure room with loud fan. Patient is also hard of hearing. Patient followed gestural cues well throughout session. Patient weaker and required incr assist with sit to stand and therefore not safe to ambulate with only 1 person assist. Repeated sit to stands for strengthening and returned to bed for safety. (Son not present today).     Follow Up Recommendations  SNF     Equipment Recommendations  None recommended by PT    Recommendations for Other Services       Precautions / Restrictions Precautions Precautions: Fall    Mobility  Bed Mobility Overal bed mobility: Needs Assistance Bed Mobility: Supine to Sit;Sit to Supine     Supine to sit: HOB elevated;Min guard Sit to supine: Mod assist   General bed mobility comments: Min guard A for safety and cues for using rail. Return to sidelying required incr assist due to pt not understanding to pass thru sidelying and trying to go straight to his back  Transfers Overall transfer level: Needs assistance Equipment used: Rolling walker (2 wheeled) Transfers: Sit to/from Stand Sit to Stand: Min assist;Mod assist         General transfer comment: initial stand from bed at regular height was mod assist; for repeated sit to stand, bed elevated and min assist  Ambulation/Gait Ambulation/Gait assistance: Min assist   Assistive device: Rolling walker (2 wheeled)       General Gait Details: pre-gait at EOB (marching); side step toward [place]             Wheelchair Mobility    Modified Rankin (Stroke Patients Only)        Balance Overall balance assessment: Needs assistance Sitting-balance support: Feet supported;Bilateral upper extremity supported;No upper extremity supported Sitting balance-Leahy Scale: Fair     Standing balance support: Bilateral upper extremity supported Standing balance-Leahy Scale: Poor Standing balance comment: reliant on UE support                            Cognition Arousal/Alertness: Awake/alert Behavior During Therapy: WFL for tasks assessed/performed Overall Cognitive Status: Impaired/Different from baseline Area of Impairment: Orientation;Attention;Memory;Following commands;Safety/judgement;Awareness;Problem solving                 Orientation Level:  (NT) Current Attention Level: Sustained Memory: Decreased recall of precautions;Decreased short-term memory Following Commands: Follows one step commands inconsistently (hearing did not help command following ability) Safety/Judgement: Decreased awareness of safety;Decreased awareness of deficits Awareness: Intellectual Problem Solving: Slow processing;Difficulty sequencing;Requires verbal cues General Comments: Very HOH making it difficult to assess cognition. He followed hand gestures well.      Exercises      General Comments        Pertinent Vitals/Pain Pain Assessment: No/denies pain    Home Living                      Prior Function            PT Goals (current goals can now be  found in the care plan section) Acute Rehab PT Goals Patient Stated Goal: son wants him back to normal, return to [facility]. Time For Goal Achievement: 02/13/20 Potential to Achieve Goals: Good Progress towards PT goals: Not progressing toward goals - comment (weaker?)    Frequency    Min 2X/week      PT Plan Current plan remains appropriate    Co-evaluation              AM-PAC PT "6 Clicks" Mobility   Outcome Measure  Help needed turning from your back to your side while in  a flat bed without using bedrails?: A Little Help needed moving from lying on your back to sitting on the side of a flat bed without using bedrails?: A Little Help needed moving to and from a bed to a chair (including a wheelchair)?: A Lot Help needed standing up from a chair using your arms (e.g., wheelchair or bedside chair)?: A Lot Help needed to walk in hospital room?: Total Help needed climbing 3-5 steps with a railing? : Total 6 Click Score: 12    End of Session Equipment Utilized During Treatment: Gait belt;Oxygen Activity Tolerance: Patient tolerated treatment well Patient left: with call bell/phone within reach;in bed;with bed alarm set   PT Visit Diagnosis: Muscle weakness (generalized) (M62.81);Difficulty in walking, not elsewhere classified (R26.2)     Time: 5364-6803 PT Time Calculation (min) (ACUTE ONLY): 30 min  Charges:  $Therapeutic Activity: 23-37 mins                      Arby Barrette, PT Pager [phone]    Rexanne Mano 02/03/2020, 5:26 PM

## 2020-02-03 NOTE — Consult Note (Signed)
Consultation Note Date: 02/03/2020   Patient Name: Ernest Clark.  DOB: [birth year]  MRN: [id number]  Age / Sex: 87 y.o., male  PCP: Hulan Fess, MD Referring Physician: Thurnell Lose, MD  Reason for Consultation: Establishing goals of care  HPI/Patient Profile: 87 y.o. male  with past medical history of CAD s/p PCI, PAD, HTN, HLD, COPD on home oxygen 2L, DVT admitted on 01/06/2020 with hypoxia and +COVID from SNF. Hospital admission for acute on chronic hypoxic and hypercarbic respiratory failure due to HCAP and COVID 19 infection. Receiving medical management. Antibiotics discontinued on 1/30. Delirium/acute metabolic encephalopathy superimposed on dementia. Head CT negative for acute findings. Palliative medicine consultation for goals of care.   Clinical Assessment and Goals of Care:  I have reviewed medical records, discussed with care team and spoke with son Towson III) via telephone to discuss goals of care.   Per nursing report, Mr. Ermal Haberer is more awake this afternoon. He is oriented only to self but pleasant. He is only eating 15-25% of meals. He is not having any pain or distress. He is back to baseline 2L [place].   I introduced Palliative Medicine as specialized medical care for people living with serious illness. It focuses on providing relief from the symptoms and stress of a serious illness. The goal is to improve quality of life for both the patient and the family.  Son, Karnell reports at baseline, his father does not have underlying dementia. He typically is awake, alert, oriented and engaged in discussion. His father is hard of hearing. Patient is under long-term care at [facility] but son reports he recently starting working with therapy again in December. He was actually improving and able to ambulate with a walker (after being wheelchair bound for many months).   Discussed events  leading up to admission and course of hospitalization including diagnoses, interventions, plan of care. Anthoni is appreciative of updates from Dr. Candiss Norse and nursing staff. Muhannad has visited his father daily and shares that this morning, he was more awake and significantly improved cognitively compared to the weekend. Romond remains hopeful and "optimistic" that his father will continue to improve.  Alamin III and I did review his father's documented wishes uploaded into EMR (MOST, living will/HCPOA, durable DNR). Son confirms his father's wishes for DNR/DNI code status. Medically recommended against feeding tube placement if his father's condition worsens and/or unable to sustain nutritional status--sharing potential complications with feeding tube and concern this would not impact his father's quality of life. Eaven agrees that he would likely not pursue feeding tube if it came to this.   Discussed watchful waiting, time for outcomes. Also 'hoping for the best' but also preparing if his father's condition further declined. Discussed outpatient palliative vs hospice at [facility]. Again, son remains hopeful for ongoing improvement but also open to further hospice discussion if decline.   Reassured of ongoing palliative f/u this admit. Answered questions.    SUMMARY OF RECOMMENDATIONS    Son confirms DNR/DNI code status.  Son likely would opt against feeding tube placement if ongoing nutritional status decline, understanding this would not impact his father's quality of life.   Continue current plan of care and medical management. Watchful waiting.   Son remains optimistic today with his father's improvement in mental status. He is hopeful for ongoing improvement.   Continue PT/OT efforts.   Continue assist and encouragement with meals. Aspiration precautions.   Son interested in outpatient palliative f/u at SNF. He is open to further hospice discussions if his father's condition declines.   Code  Status/Advance Care Planning:  DNR  Symptom Management:   Per attending  Palliative Prophylaxis:   Aspiration, Delirium Protocol, Frequent Pain Assessment, Oral Care and Turn Reposition  Psycho-social/Spiritual:   Desire for further Chaplaincy support: yes  Additional Recommendations: Caregiving  Support/Resources, Compassionate Wean Education and Education on Hospice  Prognosis:   Guarded  Discharge Planning: To Be Determined      Primary Diagnoses: Present on Admission: . HCAP (healthcare-associated pneumonia) . (Resolved) Acute respiratory failure with hypoxia ([place]) . Hypotension . Acute respiratory failure with hypoxia and hypercapnia ([facility]) . COVID-19 virus infection . Diabetes mellitus type 2, uncontrolled ([place]) . DNR (do not resuscitate) . Hypothyroid   I have reviewed the medical record, interviewed the patient and family, and examined the patient. The following aspects are pertinent.  Past Medical History:  Diagnosis Date  . COPD (chronic obstructive pulmonary disease) ([place])   . Coronary heart disease    remote PCI  . Diabetes mellitus   . DVT (deep venous thrombosis) ([place]) 2011  . Gout   . Hyperlipidemia   . Hypertension   . Peripheral arterial disease ([place])    Social History   Socioeconomic History  . Marital status: Divorced    Spouse name: Not on file  . Number of children: 2  . Years of education: Not on file  . Highest education level: Not on file  Occupational History  . Occupation: retired from [employer]  . Smoking status: Former Smoker    Packs/day: 2.00    Years: 60.00    Pack years: 120.00    Types: Cigarettes    Quit date: 01/02/2001    Years since quitting: 19.0  . Smokeless tobacco: Former Systems developer    Types: Chew  Substance and Sexual Activity  . Alcohol use: No  . Drug use: No  . Sexual activity: Not on file  Other Topics Concern  . Not on file  Social History Narrative  . Not on file   Social Determinants  of Health   Financial Resource Strain: Not on file  Food Insecurity: Not on file  Transportation Needs: Not on file  Physical Activity: Not on file  Stress: Not on file  Social Connections: Not on file   Family History  Problem Relation Age of Onset  . Diabetes Father   . Diabetes Mother   . Diabetes Brother   . Diabetes Brother    Scheduled Meds: . aspirin EC  81 mg Oral Daily  . DULoxetine  40 mg Oral Daily  . enoxaparin (LOVENOX) injection  40 mg Subcutaneous Q24H  . levothyroxine  50 mcg Oral QAC breakfast  . methylPREDNISolone (SOLU-MEDROL) injection  40 mg Intravenous Daily  . pantoprazole  40 mg Oral Q1200  . rosuvastatin  40 mg Oral Daily  . umeclidinium-vilanterol  1 puff Inhalation Daily   Continuous Infusions: . dextrose 5 % and 0.45 % NaCl with KCl 10 mEq/L 30 mL/hr at  02/03/20 1246   PRN Meds:.haloperidol lactate, Ipratropium-Albuterol, Resource ThickenUp Clear Medications Prior to Admission:  Prior to Admission medications   Medication Sig Start Date End Date Taking? Authorizing Provider  Acetaminophen 500 MG capsule Take 1,000 mg by mouth every 4 (four) hours as needed for pain.   Yes [provider]  albuterol (ACCUNEB) 0.63 MG/3ML nebulizer solution Take 1 ampule by nebulization every 6 (six) hours as needed for wheezing.   Yes [provider]  albuterol (VENTOLIN HFA) 108 (90 Base) MCG/ACT inhaler    Yes [provider]  allopurinol (ZYLOPRIM) 100 MG tablet Take 100 mg by mouth daily.  10/13/14  Yes [provider]  ANORO ELLIPTA 62.5-25 MCG/INH AEPB USE 1 INHALATION ORALLY ONE TIME DAILY Patient taking differently: Inhale 1 puff into the lungs daily. 07/24/18  Yes Collene Gobble, MD  Ascorbic Acid (VITAMIN C WITH ROSE HIPS) 500 MG tablet Take 1,000 mg by mouth daily.   Yes [provider]  aspirin 81 MG tablet Take 81 mg by mouth daily.   Yes [provider]  cholecalciferol (VITAMIN D3) 25 MCG (1000  UNIT) tablet Take 1,000 Units by mouth daily.   Yes [provider]  docusate sodium (COLACE) 100 MG capsule Take 200 mg by mouth 2 (two) times daily.    Yes [provider]  DULoxetine HCl 40 MG CPEP Take 40 mg by mouth daily.   Yes [provider]  Ferrous Sulfate (IRON) 325 (65 Fe) MG TABS Take 325 mg by mouth every other day.   Yes [provider]  FLOVENT DISKUS 100 MCG/BLIST AEPB Inhale 1 puff into the lungs 2 (two) times daily. 12/31/19  Yes [provider]  furosemide (LASIX) 20 MG tablet Take 10 mg by mouth daily.   Yes [provider]  guaiFENesin (MUCINEX) 600 MG 12 hr tablet Take 600 mg by mouth 2 (two) times daily.   Yes [provider]  Ipratropium-Albuterol (COMBIVENT RESPIMAT) 20-100 MCG/ACT AERS respimat Inhale 1 puff into the lungs in the morning and at bedtime.   Yes [provider]  ipratropium-albuterol (DUONEB) 0.5-2.5 (3) MG/3ML SOLN Take 3 mLs by nebulization every 6 (six) hours as needed. 07/22/19  Yes Shelly Coss, MD  levothyroxine (SYNTHROID) 50 MCG tablet Take 50 mcg by mouth daily before breakfast. 05/08/18  Yes [provider]  Melatonin 5 MG CAPS Take 5 mg by mouth at bedtime.   Yes [provider]  mirtazapine (REMERON) 7.5 MG tablet Take 7.5 mg by mouth at bedtime. 01/11/20  Yes [provider]  montelukast (SINGULAIR) 10 MG tablet Take 10 mg by mouth at bedtime.   Yes [provider]  omeprazole (PRILOSEC) 20 MG capsule Take 20 mg by mouth 2 (two) times daily.   Yes [provider]  polyethylene glycol (MIRALAX / GLYCOLAX) packet Take 17 g by mouth daily as needed for moderate constipation. 07/08/16  Yes Rai, Ripudeep K, MD  Potassium Chloride ER 20 MEQ TBCR Take 20 mEq by mouth daily.   Yes [provider]  rosuvastatin (CRESTOR) 40 MG tablet Take 40 mg by mouth daily.   Yes [provider]  sodium chloride (OCEAN) 0.65 % SOLN nasal  spray Place 2 sprays into both nostrils every 4 (four) hours.   Yes [provider]  traZODone (DESYREL) 50 MG tablet Take 50 mg by mouth at bedtime. 01/06/20  Yes [provider]  TRESIBA FLEXTOUCH 100 UNIT/ML SOPN FlexTouch Pen Inject 10 Units  into the skin in the morning. 11/10/17  Yes [provider]  zinc sulfate 220 (50 Zn) MG capsule Take 220 mg by mouth daily.   Yes [provider]  BD PEN NEEDLE NANO U/F 32G X 4 MM MISC  09/21/17   [provider]   Allergies  Allergen Reactions  . Actos [Pioglitazone Hydrochloride] Other (See Comments)    RIGHT LEG SWELLING   . Angiotensin Receptor Blockers Cough  . Furosemide Other (See Comments)     Hyponatremia. Pt is taking furosemide now  . Vicodin Hp [Hydrocodone-Acetaminophen] Other (See Comments)    Unknown reaction  . Metformin And Related Other (See Comments)    Reaction unknown   Review of Systems  Unable to perform ROS: Acuity of condition   Physical Exam Vitals and nursing note reviewed.    Vital Signs: BP 126/61 (BP Location: Left Arm)   Pulse 93   Temp 98.6 F (37 C)   Resp 20   Wt 95.7 kg   SpO2 100%   BMI 32.08 kg/m  Pain Scale: 0-10   Pain Score: 0-No pain   SpO2: SpO2: 100 % O2 Device:SpO2: 100 % O2 Flow Rate: .O2 Flow Rate (L/min): 3 L/min  IO: Intake/output summary:   Intake/Output Summary (Last 24 hours) at 02/03/2020 1517 Last data filed at 02/03/2020 0029 Gross per 24 hour  Intake -  Output 900 ml  Net -900 ml    LBM: Last BM Date: 01/30/20 Baseline Weight: Weight: 95.7 kg Most recent weight: Weight: 95.7 kg     Palliative Assessment/Data: PPS 30%     Time Total: 40 Greater than 50%  of this time was spent counseling and coordinating care related to the above assessment and plan.  The above conversation was completed via telephone due to visitor restrictions during COVID-19 pandemic. Thorough chart review and discussion with multidisciplinary team  was completed as part of assessment. No physical examination was performed.   Signed by:  Ihor Dow, DNP, FNP-C Palliative Medicine Team  Phone: [phone] Fax: [phone]   Please contact Palliative Medicine Team phone at [phone] for questions and concerns.  For individual provider: See Shea Evans

## 2020-02-03 DEATH — deceased

## 2020-02-04 ENCOUNTER — Inpatient Hospital Stay (HOSPITAL_COMMUNITY): Payer: Medicare Other

## 2020-02-04 DIAGNOSIS — Z515 Encounter for palliative care: Secondary | ICD-10-CM | POA: Diagnosis not present

## 2020-02-04 DIAGNOSIS — J9602 Acute respiratory failure with hypercapnia: Secondary | ICD-10-CM | POA: Diagnosis not present

## 2020-02-04 DIAGNOSIS — U071 COVID-19: Secondary | ICD-10-CM | POA: Diagnosis not present

## 2020-02-04 DIAGNOSIS — J9601 Acute respiratory failure with hypoxia: Secondary | ICD-10-CM | POA: Diagnosis not present

## 2020-02-04 DIAGNOSIS — J189 Pneumonia, unspecified organism: Secondary | ICD-10-CM | POA: Diagnosis not present

## 2020-02-04 LAB — CBC WITH DIFFERENTIAL/PLATELET
Abs Immature Granulocytes: 0.29 10*3/uL — ABNORMAL HIGH (ref 0.00–0.07)
Basophils Absolute: 0.1 10*3/uL (ref 0.0–0.1)
Basophils Relative: 1 %
Eosinophils Absolute: 0 10*3/uL (ref 0.0–0.5)
Eosinophils Relative: 1 %
HCT: 43.8 % (ref 39.0–52.0)
Hemoglobin: 12.2 g/dL — ABNORMAL LOW (ref 13.0–17.0)
Immature Granulocytes: 4 %
Lymphocytes Relative: 9 %
Lymphs Abs: 0.8 10*3/uL (ref 0.7–4.0)
MCH: 28.6 pg (ref 26.0–34.0)
MCHC: 27.9 g/dL — ABNORMAL LOW (ref 30.0–36.0)
MCV: 102.8 fL — ABNORMAL HIGH (ref 80.0–100.0)
Monocytes Absolute: 0.6 10*3/uL (ref 0.1–1.0)
Monocytes Relative: 7 %
Neutro Abs: 6.5 10*3/uL (ref 1.7–7.7)
Neutrophils Relative %: 78 %
Platelets: 229 10*3/uL (ref 150–400)
RBC: 4.26 MIL/uL (ref 4.22–5.81)
RDW: 16.4 % — ABNORMAL HIGH (ref 11.5–15.5)
WBC: 8.2 10*3/uL (ref 4.0–10.5)
nRBC: 0 % (ref 0.0–0.2)

## 2020-02-04 LAB — COMPREHENSIVE METABOLIC PANEL
ALT: 18 U/L (ref 0–44)
AST: 19 U/L (ref 15–41)
Albumin: 2.5 g/dL — ABNORMAL LOW (ref 3.5–5.0)
Alkaline Phosphatase: 69 U/L (ref 38–126)
Anion gap: 7 (ref 5–15)
BUN: 16 mg/dL (ref 8–23)
CO2: 39 mmol/L — ABNORMAL HIGH (ref 22–32)
Calcium: 8.8 mg/dL — ABNORMAL LOW (ref 8.9–10.3)
Chloride: 98 mmol/L (ref 98–111)
Creatinine, Ser: 1 mg/dL (ref 0.61–1.24)
GFR, Estimated: >60 mL/min (ref >60)
Glucose, Bld: 158 mg/dL — ABNORMAL HIGH (ref 70–99)
Potassium: 3.5 mmol/L (ref 3.5–5.1)
Sodium: 144 mmol/L (ref 135–145)
Total Bilirubin: 1 mg/dL (ref 0.3–1.2)
Total Protein: 5.5 g/dL — ABNORMAL LOW (ref 6.5–8.1)

## 2020-02-04 LAB — GLUCOSE, CAPILLARY
Glucose-Capillary: 162 mg/dL — ABNORMAL HIGH (ref 70–99)
Glucose-Capillary: 128 mg/dL — ABNORMAL HIGH (ref 70–99)
Glucose-Capillary: 164 mg/dL — ABNORMAL HIGH (ref 70–99)
Glucose-Capillary: 255 mg/dL — ABNORMAL HIGH (ref 70–99)
Glucose-Capillary: 280 mg/dL — ABNORMAL HIGH (ref 70–99)
Glucose-Capillary: 251 mg/dL — ABNORMAL HIGH (ref 70–99)

## 2020-02-04 LAB — C-REACTIVE PROTEIN: CRP: 1.7 mg/dL — ABNORMAL HIGH

## 2020-02-04 LAB — D-DIMER, QUANTITATIVE: D-Dimer, Quant: 0.61 ug{FEU}/mL — ABNORMAL HIGH (ref 0.00–0.50)

## 2020-02-04 LAB — BRAIN NATRIURETIC PEPTIDE: B Natriuretic Peptide: 108.7 pg/mL — ABNORMAL HIGH (ref 0.0–100.0)

## 2020-02-04 LAB — MAGNESIUM: Magnesium: 2.4 mg/dL (ref 1.7–2.4)

## 2020-02-04 LAB — PROCALCITONIN: Procalcitonin: <0.1 ng/mL

## 2020-02-04 MED ORDER — GLUCERNA SHAKE PO LIQD
237.0000 mL | Freq: Three times a day (TID) | ORAL | Status: DC
  Administered 2020-02-04 – 2020-02-05 (×2): 237 mL via ORAL

## 2020-02-04 NOTE — Progress Notes (Signed)
Entered patient room for safety round. Patient had removed mitts and oxygen and saturation was 70%. Increased O2 to 6L to help patient recover, patient saturation now 91%. Educated patient to not remove mitts and oxygen because he is oxygen dependent at this time. No evidence of learning.

## 2020-02-04 NOTE — Progress Notes (Signed)
PROGRESS NOTE                                                                                                                                                                                                             Patient Demographics:    Ernest Clark, is a 87 y.o. male, DOB - Apr 16, 1931, SD:[id number]  Outpatient Primary MD for the patient is Hulan Fess, MD   Admit date - 02/01/2020   LOS - 6  Chief Complaint  Patient presents with  . Covid Positive       Brief Narrative: Patient is a 87 y.o. male with PMHx of CAD s/p PCI, PAD, HTN, HLD, COPD on home O2 2 L/min, DVT-presented to the hospital with acute on chronic hypoxic and hypercarbic respiratory failure due to HCAP/bacterial and COVID-19 infection.  COVID-19 vaccinated status: Vaccinated  Significant Events:  1/27>> Admit to [facility] for acute hypoxic/hypercarbic respiratory failure-HCAP/COVID-19 infection  Significant studies: 1/27>> CTA chest: No PE, bilateral airspace opacity-right>> left   COVID-19 medications: Steroids: 1/27>> Remdesivir: 1/27>>  Antibiotics: Rocephin: 1/27>> Zithromax: 1/27>>  Microbiology data: 1/27 >>blood culture: No growth  Procedures: None  Consults: PCCM, Pall.Care  DVT prophylaxis: enoxaparin (LOVENOX) injection 40 mg Start: 02/01/2020 0900    Subjective:   Patient in bed although in no distress overall appears quite frail, deconditioned and somewhat confused, is able to follow minimal commands at best   Assessment  & Plan :   Acute on chronic hypoxemic and hypercarbic respiratory failure: Suspect this is mostly from bacterial/HCAP-likely aspiration pneumonia-could have some element of COVID-19 infection.  Seems to have improved compared to on admission-back on usual home regimen of 2 L of oxygen.  Continue empiric antimicrobial therapy/steroids and Remdesivir.  Await culture data and narrow antibiotic spectrum  accordingly.    Speech therapy following for safe diet.  Currently on nectar thick liquids with dysphagia 2 diet per speech.  O2 requirements:   SpO2: 99 % O2 Flow Rate (L/min): 3 L/min FiO2 (%): 36 %     Recent Labs  Lab 01/20/2020 0952 01/20/2020 1139 01/31/2020 1258 01/12/2020 1259 01/24/2020 1325 01/23/2020 1439 01/31/20 0852 02/01/20 0049 02/02/20 0116 02/03/20 0222 02/04/20 0445  WBC  --   --   --   --   --    < > 11.2* 8.7 7.0 7.3 8.2  HGB  --   --   --   --   --    < >  12.6* 12.5* 12.4* 12.7* 12.2*  HCT  --   --   --   --   --    < > 42.9 42.4 42.0 42.5 43.8  PLT  --   --   --   --   --    < > 220 242 235 250 229  CRP  --   --   --   --   --   --  7.6* 5.3* 3.2* 2.0* 1.7*  BNP  --   --   --   --   --   --  531.7* 451.7* 319.4* 165.4* 108.7*  DDIMER  --   --   --   --   --   --  0.69* 0.48 0.59* 0.57* 0.61*  PROCALCITON  --   --   --   --   --   --  <0.10 <0.10 <0.10 <0.10 <0.10  AST  --   --  31  --   --   --   --  32 23 18 19   ALT  --   --  28  --   --   --   --  31 25 23 18   ALKPHOS  --   --  90  --   --   --   --  80 73 76 69  BILITOT  --   --  0.6  --   --   --   --  0.7 0.8 1.0 1.0  ALBUMIN  --   --  2.6*  --   --   --   --  2.7* 2.6* 2.7* 2.5*  LATICACIDVEN 1.1 1.6  --  2.2*  --   --   --   --   --   --   --   SARSCOV2NAA  --   --   --   --  POSITIVE*  --   --   --   --   --   --    < > = values in this interval not displayed.      Prone/Incentive Spirometry: encouraged patient to lie prone for 3-4 hours at a time for a total of 16 hours a day, and to encourage incentive spirometry use 3-4/hour.  Sepsis due to underlying COVID-19 pneumonia: Secondary to PNA-sepsis physiology has resolved.  So far cultures negative procalcitonin is undetectable hence will discontinue antibiotics on 02/01/2020.  Delirium/acute metabolic encephalopathy superimposed on dementia: Suspect delirium/metabolic encephalopathy due to hypoxemia/pneumonia.  On home dose Cymbalta and Remeron,  received Haldol early morning 02/01/2020 and extremely somnolent after that, -ve head CT .  Continue supportive care, minimize benzodiazepines and narcotics as much as possible.  Since he is somnolent gentle hydration with IV fluids to continue, negative care is following, I do not see any meaningful improvement in his mentation, although from the time of admission he is mildly improved but I do not think that he will be able to eat or drink safely with this mental status.  CAD/PAD: All stable-continue aspirin/Crestor  COPD: Not in flare-continue bronchodilators  Hypothyroidism: Continue levothyroxine  Obesity: BMI 32.  Follow with PCP.  DM-2: CBG stable-continue SSI  Lab Results  Component Value Date   HGBA1C 7.0 (H) 01/30/2020   Recent Labs    02/03/20 2307 02/04/20 0302 02/04/20 0818  GLUCAP 168* 162* 128*     Condition - Stable  Family Communication  :  Son - [phone] left voicemail on 01/30/20, left voicemail on 01/31/2020  at 9:27 AM, updated again 02/02/2020 at 11:10 AM  Code Status :  DNR  Diet :  Diet Order            DIET DYS 2 Room service appropriate? No; Fluid consistency: Thin  Diet effective now                  Disposition Plan  :   Status is: Inpatient  Remains inpatient appropriate because:Inpatient level of care appropriate due to severity of illness   Dispo: The patient is from: SNF              Anticipated d/c is to: SNF              Anticipated d/c date is: > 3 days              Patient currently is not medically stable to d/c.   Difficult to place patient No  Barriers to discharge: Hypoxia requiring O2 supplementation/complete 5 days of IV Remdesivir  Antimicorbials  :    Anti-infectives (From admission, onward)   Start     Dose/Rate Route Frequency Ordered Stop   01/30/20 1000  remdesivir 100 mg in sodium chloride 0.9 % 100 mL IVPB       "Followed by" Linked Group Details   100 mg 200 mL/hr over 30 Minutes Intravenous Daily 01/30/2020  1105 02/02/20 0942   01/05/2020 1200  remdesivir 200 mg in sodium chloride 0.9% 250 mL IVPB       "Followed by" Linked Group Details   200 mg 580 mL/hr over 30 Minutes Intravenous Once 01/09/2020 1105 01/25/2020 1405   01/18/2020 0830  cefTRIAXone (ROCEPHIN) 2 g in sodium chloride 0.9 % 100 mL IVPB  Status:  Discontinued        2 g 200 mL/hr over 30 Minutes Intravenous Every 24 hours 01/28/2020 0805 02/01/20 1133   01/18/2020 0815  azithromycin (ZITHROMAX) 500 mg in sodium chloride 0.9 % 250 mL IVPB  Status:  Discontinued        500 mg 250 mL/hr over 60 Minutes Intravenous Every 24 hours 01/07/2020 0805 02/01/20 1133   01/04/2020 0700  ceFEPIme (MAXIPIME) 2 g in sodium chloride 0.9 % 100 mL IVPB  Status:  Discontinued        2 g 200 mL/hr over 30 Minutes Intravenous Every 8 hours 01/10/2020 0656 02/01/2020 0805      Inpatient Medications  Scheduled Meds: . aspirin EC  81 mg Oral Daily  . DULoxetine  40 mg Oral Daily  . enoxaparin (LOVENOX) injection  40 mg Subcutaneous Q24H  . levothyroxine  50 mcg Oral QAC breakfast  . methylPREDNISolone (SOLU-MEDROL) injection  40 mg Intravenous Daily  . pantoprazole  40 mg Oral Q1200  . rosuvastatin  40 mg Oral Daily  . umeclidinium-vilanterol  1 puff Inhalation Daily   Continuous Infusions:  PRN Meds:.haloperidol lactate, Ipratropium-Albuterol, Resource ThickenUp Clear   Time Spent in minutes  25  See all Orders from today for further details   Lala Lund M.D on 02/04/2020 at 9:20 AM  To page go to [email or web address] - use universal password  [facility] -  Office  [phone]    Objective:   Vitals:   02/02/20 2157 02/03/20 1303 02/03/20 2127 02/04/20 0539  BP: (!) 155/78 126/61 115/69 110/74  Pulse: 98 93 92   Resp:  20    Temp: (!) 96.5 F (35.8 C) 98.6 F (37 C) 98.5 F (36.9 C) 98.4 F (36.9 C)  TempSrc:   Oral Oral  SpO2: 100% 100% 100% 99%  Weight:        Wt Readings from Last 3 Encounters:  01/20/2020 95.7 kg  09/17/19 95.7  kg  07/22/19 103 kg    No intake or output data in the 24 hours ending 02/04/20 0920   Physical Exam  Awake liver appears to be extremely weak and frail, overall still quite confused, moves all 4 extremities to painful stimuli, inconsistently follows commands [place].AT,PERRAL Supple Neck,No JVD, No cervical lymphadenopathy appriciated.  Symmetrical Chest wall movement, Good air movement bilaterally, CTAB RRR,No Gallops, Rubs or new Murmurs, No Parasternal Heave +ve B.Sounds, Abd Soft, No tenderness, No organomegaly appriciated, No rebound - guarding or rigidity. No Cyanosis, Clubbing or edema, No new Rash or bruise     Data Review:    CBC Recent Labs  Lab 01/30/20 0516 01/31/20 0852 02/01/20 0049 02/02/20 0116 02/03/20 0222 02/04/20 0445  WBC 9.3 11.2* 8.7 7.0 7.3 8.2  HGB 12.0* 12.6* 12.5* 12.4* 12.7* 12.2*  HCT 39.1 42.9 42.4 42.0 42.5 43.8  PLT 182 220 242 235 250 229  MCV 97.5 97.9 97.0 97.0 98.8 102.8*  MCH 29.9 28.8 28.6 28.6 29.5 28.6  MCHC 30.7 29.4* 29.5* 29.5* 29.9* 27.9*  RDW 16.5* 16.4* 16.2* 16.2* 16.6* 16.4*  LYMPHSABS 0.5*  --  0.6* 0.7 1.0 0.8  MONOABS 0.2  --  0.7 0.7 0.7 0.6  EOSABS 0.0  --  0.0 0.0 0.0 0.0  BASOSABS 0.0  --  0.0 0.0 0.1 0.1    Chemistries  Recent Labs  Lab 01/14/2020 1258 01/11/2020 1439 01/31/20 0852 02/01/20 0049 02/02/20 0116 02/03/20 0222 02/04/20 0445  NA  --    < > 141 140 142 144 144  K  --    < > 4.1 3.7 3.7 3.9 3.5  CL  --   --  97* 95* 98 97* 98  CO2  --   --  31 34* 35* 40* 39*  GLUCOSE  --   --  187* 161* 169* 173* 158*  BUN  --   --  17 17 17 18 16   CREATININE  --   --  0.78 0.85 0.86 1.02 1.00  CALCIUM  --   --  8.8* 8.9 8.7* 8.7* 8.8*  MG  --   --  2.2 2.2 2.2 2.4 2.4  AST 31  --   --  32 23 18 19   ALT 28  --   --  31 25 23 18   ALKPHOS 90  --   --  80 73 76 69  BILITOT 0.6  --   --  0.7 0.8 1.0 1.0   < > = values in this interval not displayed.    ------------------------------------------------------------------------------------------------------------------ No results for input(s): CHOL, HDL, LDLCALC, TRIG, CHOLHDL, LDLDIRECT in the last 72 hours.  Lab Results  Component Value Date   HGBA1C 7.0 (H) 01/30/2020   ------------------------------------------------------------------------------------------------------------------ No results for input(s): TSH, T4TOTAL, T3FREE, THYROIDAB in the last 72 hours.  Invalid input(s): FREET3 ------------------------------------------------------------------------------------------------------------------ No results for input(s): VITAMINB12, FOLATE, FERRITIN, TIBC, IRON, RETICCTPCT in the last 72 hours.  Coagulation profile No results for input(s): INR, PROTIME in the last 168 hours.  Recent Labs    02/03/20 0222 02/04/20 0445  DDIMER 0.57* 0.61*    Cardiac Enzymes No results for input(s): CKMB, TROPONINI, MYOGLOBIN in the last 168 hours.  Invalid input(s): CK ------------------------------------------------------------------------------------------------------------------    Component Value Date/Time   BNP 108.7 (H) 02/04/2020 0445  Micro Results Recent Results (from the past 240 hour(s))  Culture, blood (x 2)     Status: None   Collection Time: 02/01/2020  8:00 AM   Specimen: BLOOD RIGHT FOREARM  Result Value Ref Range Status   Specimen Description BLOOD RIGHT FOREARM  Final   Special Requests   Final    BOTTLES DRAWN AEROBIC AND ANAEROBIC Blood Culture results may not be optimal due to an excessive volume of blood received in culture bottles   Culture   Final    NO GROWTH 5 DAYS Performed at [facility], [place] [address] [place], [place] [address]    Report Status 02/03/2020 FINAL  Final  Culture, blood (x 2)     Status: None   Collection Time: 01/22/2020 12:58 PM   Specimen: BLOOD  Result Value Ref Range Status   Specimen Description BLOOD LEFT ANTECUBITAL   Final   Special Requests   Final    BOTTLES DRAWN AEROBIC AND ANAEROBIC Blood Culture adequate volume   Culture   Final    NO GROWTH 5 DAYS Performed at [facility], [place] [address], [place], [place] [address]    Report Status 02/03/2020 FINAL  Final  SARS CORONAVIRUS 2 (TAT 6-24 HRS) Nasopharyngeal Nasopharyngeal Swab     Status: Abnormal   Collection Time: 01/19/2020  1:25 PM   Specimen: Nasopharyngeal Swab  Result Value Ref Range Status   SARS Coronavirus 2 POSITIVE (A) NEGATIVE Final    Comment: (NOTE) SARS-CoV-2 target nucleic acids are DETECTED.  The SARS-CoV-2 RNA is generally detectable in upper and lower respiratory specimens during the acute phase of infection. Positive results are indicative of the presence of SARS-CoV-2 RNA. Clinical correlation with patient history and other diagnostic information is  necessary to determine patient infection status. Positive results do not rule out bacterial infection or co-infection with other viruses.  The expected result is Negative.  Fact Sheet for Patients: SugarRoll.be  Fact Sheet for Healthcare Providers: https://www.woods-mathews.com/  This test is not yet approved or cleared by the [place] FDA and  has been authorized for detection and/or diagnosis of SARS-CoV-2 by FDA under an Emergency Use Authorization (EUA). This EUA will remain  in effect (meaning this test can be used) for the duration of the COVID-19 declaration under Section 564(b)(1) of the Act, 21 U. S.C. section 360bbb-3(b)(1), unless the authorization is terminated or revoked sooner.   Performed at [facility], [place] [address] [place], [place] [address]     Radiology Reports CT HEAD WO CONTRAST  Result Date: 02/01/2020 CLINICAL DATA:  Delirium EXAM: CT HEAD WITHOUT CONTRAST TECHNIQUE: Contiguous axial images were obtained from the base of the skull through the vertex without intravenous contrast.  COMPARISON:  CT head 07/14/2019 FINDINGS: Brain: Image quality degraded by extensive motion Generalized atrophy.  No acute infarct, hemorrhage, mass Vascular: Carotid and vertebral artery calcification. Negative for hyperdense vessel Skull: Negative Sinuses/Orbits: Mucosal edema paranasal sinuses. Bilateral cataract extraction Other: None IMPRESSION: Image quality degraded by extensive motion. No acute abnormality identified Electronically Signed   By: Franchot Gallo M.D.   On: 02/01/2020 14:56   CT Angio Chest PE W and/or Wo Contrast  Result Date: 01/06/2020 CLINICAL DATA:  Positive D-dimer and COVID-19 pneumonia, initial encounter EXAM: CT ANGIOGRAPHY CHEST WITH CONTRAST TECHNIQUE: Multidetector CT imaging of the chest was performed using the standard protocol during bolus administration of intravenous contrast. Multiplanar CT image reconstructions and MIPs were obtained to evaluate the vascular  anatomy. CONTRAST:  32mL OMNIPAQUE IOHEXOL 350 MG/ML SOLN COMPARISON:  Chest x-ray from earlier in the same day, CT from 07/03/2017 FINDINGS: Cardiovascular: Thoracic aorta and its branches demonstrate atherosclerotic calcifications. No aneurysmal dilatation is seen. The degree of opacification of the aorta is limited. No cardiac enlargement is seen. Mitral and aortic valve calcifications are seen. Heavy coronary calcifications are noted. The pulmonary artery shows a normal branching pattern. No filling defect to suggest pulmonary embolism is noted. Mediastinum/Nodes: Thoracic inlet is within normal limits. No sizable hilar or mediastinal adenopathy is noted. Small precarinal nodes are seen. The esophagus as visualized is within normal limits. Lungs/Pleura: Lungs are well aerated bilaterally with patchy airspace opacities primarily in the lower lobes with some volume loss on the right. No sizable effusion or pneumothorax is seen. No parenchymal nodules are noted. Upper Abdomen: Visualized upper abdomen is within normal  limits. Musculoskeletal: Degenerative changes of the thoracic spine are noted. No acute rib abnormality is seen. Review of the MIP images confirms the above findings. IMPRESSION: No evidence of pulmonary emboli. Bilateral airspace opacity slightly worse on the right than the left similar to that seen on prior chest x-ray consistent with the given clinical history. Aortic Atherosclerosis (ICD10-I70.0). Electronically Signed   By: Inez Catalina M.D.   On: 01/23/2020 10:58   DG Chest Port 1 View  Result Date: 01/31/2020 CLINICAL DATA:  Shortness of breath.  COVID positive. EXAM: PORTABLE CHEST 1 VIEW COMPARISON:  Chest x-rays dated 01/04/2020 and 07/20/2019. Chest CT dated 02/01/2020. FINDINGS: Bilateral airspace opacities, RIGHT greater than LEFT, not significantly changed. Heart size and mediastinal contours appear stable. No pleural effusion or pneumothorax is seen. IMPRESSION: Stable bilateral multifocal pneumonia, RIGHT greater than LEFT. Electronically Signed   By: Franki Cabot M.D.   On: 01/31/2020 09:04   DG Chest Port 1 View  Result Date: 01/13/2020 CLINICAL DATA:  COVID pneumonia, dyspnea EXAM: PORTABLE CHEST 1 VIEW COMPARISON:  07/20/2019 FINDINGS: The lungs are symmetrically inflated. Elevation of the left hemidiaphragm is again noted. There is right mid and lower lung zone consolidation, new since prior examination, possibly infectious in the acute setting. Note that aspiration or pulmonary hemorrhage could appear similarly. Mild left basilar atelectasis or infiltrate noted. No pneumothorax or pleural effusion. Mild cardiomegaly is unchanged. IMPRESSION: Right mid and lower lung zone consolidation, in keeping with acute lobar pneumonia in the appropriate clinical setting. Probable left basilar atelectasis with associated stable elevation of the left hemidiaphragm. Electronically Signed   By: Fidela Salisbury MD   On: 01/06/2020 05:06

## 2020-02-04 NOTE — Progress Notes (Signed)
SLP Cancellation Note  Patient Details Name: Ernest Clark. MRN: [id number] DOB: January 21, 1931   Cancelled treatment:       Reason Eval/Treat Not Completed: Other (comment) (SLP was contacted by Benjamine Mola, RN who indicated that the pt's family has decided to transition the pt to comfort care. The MBS for this afternoon will therefore be cancelled and SLP services discontinued. Considering [name], pt may also have more advanced consistencies if he wishes.)  Mery Guadalupe I. Hardin Negus, [place], [place] Office number [phone] Pager [phone]  [name] 02/04/2020, 11:57 AM

## 2020-02-04 NOTE — Progress Notes (Signed)
Daily Progress Note   Patient Name: Ernest Clark.       Date: 02/04/2020 DOB: 21-Oct-1931  Age: 87 y.o. MRN#: [id number] Attending Physician: Thurnell Lose, MD Primary Care Physician: Hulan Fess, MD Admit Date: 01/22/2020  Reason for Consultation/Follow-up: Establishing goals of care  Subjective: Patient drowsy and confused. Oral intake remains poor. No discomfort or distress.   GOC: F/u with son, Ernest Clark via telephone to discuss goals of care.   Again reviewed events leading up to admission and course of hospitalization including diagnoses, interventions, plan of care. Discussed concern with nutritional status and ongoing encephalopathy contributing to poor oral intake, ability to sustain nutritional status, and ongoing decline.   Discussed feeding tube placement versus comfort feeds, explaining risks for complications with long-term feeding tubes. Deloss agrees that he does not wish to 'force feed' his father by feeding tube. We discussed comfort feeds, aspiration precautions, ongoing assist and encouragement with meals. Also adding glucerna shakes if he will accept.   Per son report, Ernest Clark was under long-term care at [facility] prior to admission. Discussed consideration of hospice services on discharge, educating on hospice philosophy based on his father's documented wishes. Ernest Clark is interested in hospice initiation at nursing facility on discharge. Emphasized focus on comfort, quality of life, symptom management, and preventing recurrent hospitalization. Discussed dementia disease trajectory and expressed that prognosis will greatly depend on oral intake.   Discussed ongoing medical management inpatient. Watchful waiting and plans for d/c to SNF with hospice on discharge. Son  agrees. Reassured him that I will notify care team.   Answered questions. Son has PMT contact information.   Length of Stay: 6  Current Medications: Scheduled Meds:  . aspirin EC  81 mg Oral Daily  . DULoxetine  40 mg Oral Daily  . enoxaparin (LOVENOX) injection  40 mg Subcutaneous Q24H  . levothyroxine  50 mcg Oral QAC breakfast  . methylPREDNISolone (SOLU-MEDROL) injection  40 mg Intravenous Daily  . pantoprazole  40 mg Oral Q1200  . rosuvastatin  40 mg Oral Daily  . umeclidinium-vilanterol  1 puff Inhalation Daily    Continuous Infusions:   PRN Meds: haloperidol lactate, Ipratropium-Albuterol, Resource ThickenUp Clear  Physical Exam Vitals and nursing note reviewed.  Constitutional:      Comments: drowsy  Pulmonary:  Effort: No tachypnea, accessory muscle usage or respiratory distress.     Comments: 4L  Neurological:     Mental Status: He is easily aroused.            Vital Signs: BP 110/74 (BP Location: Left Arm)   Pulse 92   Temp 98.4 F (36.9 C) (Oral)   Resp 20   Wt 95.7 kg   SpO2 99%   BMI 32.08 kg/m  SpO2: SpO2: 99 % O2 Device: O2 Device: Nasal Cannula O2 Flow Rate: O2 Flow Rate (L/min): 3 L/min  Intake/output summary: No intake or output data in the 24 hours ending 02/04/20 1125 LBM: Last BM Date: 01/30/20 Baseline Weight: Weight: 95.7 kg Most recent weight: Weight: 95.7 kg       Palliative Assessment/Data: PPS 20%      Patient Active Problem List   Diagnosis Date Noted  . HCAP (healthcare-associated pneumonia) 01/30/2020  . Hypotension 01/28/2020  . COVID-19 virus infection 01/28/2020  . Diabetes mellitus type 2, uncontrolled (HCC) 01/15/2020  . DNR (do not resuscitate) 01/21/2020  . Hypothyroid 01/07/2020  . Palliative care encounter   . Elevated troponin   . Acute respiratory failure with hypoxia and hypercapnia (HCC)   . Hav (hallux abducto valgus), left 04/15/2019  . Hav (hallux abducto valgus), right 04/15/2019  . Nail,  injury by, initial encounter 10/15/2018  . Pain due to onychomycosis of toenails of both feet 07/09/2018  . Type 2 diabetes mellitus with vascular disease (HCC) 07/09/2018  . Allergic rhinitis 03/08/2017  . Solitary pulmonary nodule 03/08/2017  . Hyponatremia 07/04/2016  . Type 2 diabetes mellitus without complication, with long-term current use of insulin (HCC) 07/04/2016  . Multiple rib fractures 07/04/2016  . Peripheral arterial disease (HCC) 09/25/2013  . Coronary heart disease 09/25/2013  . Essential hypertension 09/25/2013  . Hyperlipidemia 09/25/2013  . COPD (chronic obstructive pulmonary disease) (HCC) 12/09/2010    Palliative Care Assessment & Plan   Patient Profile: 87 y.o. male  with past medical history of CAD s/p PCI, PAD, HTN, HLD, COPD on home oxygen 2L, DVT admitted on 01/10/2020 with hypoxia and +COVID from SNF. Hospital admission for acute on chronic hypoxic and hypercarbic respiratory failure due to HCAP and COVID 19 infection. Receiving medical management. Antibiotics discontinued on 1/30. Delirium/acute metabolic encephalopathy superimposed on dementia. Head CT negative for acute findings. Palliative medicine consultation for goals of care.   Assessment: Acute on chronic respiratory failure COVID-19 infection Bacterial/HCAP, ? Aspiration pneumonia Sepsis Acute metabolic encephalopathy  Delirium superimposed on dementia Hx of COPD Hx of CAD/PAD Hx of DM type 2  Recommendations/Plan: Continue current plan of care and medical management.  DNR/DNI, NO feeding tube. Continue current diet and comfort feeds. Aspiration precautions. Patient needs assist and encouragement with meals.  Add Glucerna Shakes.  Son open to [facility] on discharge. Attending and TOC team notified.   Code Status: DNR/DNI   Code Status Orders  (From admission, onward)         Start     Ordered   01/28/2020 0800  Do not attempt resuscitation (DNR)  Continuous        Question Answer Comment  In the event of cardiac or respiratory ARREST Do not call a "code blue"   In the event of cardiac or respiratory ARREST Do not perform Intubation, CPR, defibrillation or ACLS   In the event of cardiac or respiratory ARREST Use medication by any route, position, wound care, and other measures  to relive pain and suffering. May use oxygen, suction and manual treatment of airway obstruction as needed for comfort.      01/16/2020 0805        Code Status History    Date Active Date Inactive Code Status Order ID Comments User Context   09/17/2019 1610 02/01/2020 0237 DNR [id number]  Collene Gobble, MD Outpatient   07/14/2019 1310 07/22/2019 2244 DNR [id number]  Mckinley Jewel, MD ED   07/14/2019 1249 07/14/2019 1310 Full Code [id number]  Mckinley Jewel, MD ED   07/04/2016 2154 07/08/2016 1931 Full Code [id number]  Danford, Suann Larry, MD Inpatient   Advance Care Planning Activity      Prognosis: Guarded to poor   Discharge Planning: Likely back to LTC facility with hospice support  Care plan was discussed with Dr. Candiss Norse, RN, son (Ernest Clark)x2 today, [facility] team  Thank you for allowing the Palliative Medicine Team to assist in the care of this patient.   Total Time 40 Prolonged Time Billed  no      Greater than 50%  of this time was spent counseling and coordinating care related to the above assessment and plan.  The above conversation was completed via telephone due to visitor restrictions during COVID-19 pandemic. Thorough chart review and discussion with multidisciplinary team was completed as part of assessment. No physical examination was performed.   Ihor Dow, DNP, FNP-C Palliative Medicine Team  Phone: [phone] Fax: [phone]  Please contact Palliative Medicine Team phone at [phone] for questions and concerns.

## 2020-02-04 NOTE — Progress Notes (Addendum)
  Speech Language Pathology Treatment: Dysphagia  Patient Details Name: Ernest Clark. MRN: [id number] DOB: 1931/02/03 Today's Date: 02/04/2020 Time: 3532-9924 SLP Time Calculation (min) (ACUTE ONLY): 12 min  Assessment / Plan / Recommendation Clinical Impression  Pt was seen for dysphagia treatment. He was adequately alert, but did not communicate verbally during this session. WOB was slightly increased upon SLP's entry, but pt maintained SpO2 between 90-94% during the session. Pt exhibited coughing with the initial set of three consecutive swallows of thin liquids, suggesting possible aspiration. He tolerated subsequent trials without overt s/sx of aspiration, but consistently exhibited inhalation at the end of the swallow. Difficulty adequately coordinating respiration with swallowing is suspected. Mastication of dysphagia 2 solids was Ernest Clark and no s/sx of aspiration or significant oral residue were noted. Considering pt's current respiratory function and anticipated fluctuation in swallow function, it is recommended that dysphagia 2 solids be continued with a downgrade back to nectar thick liquids. Pt's case was discussed further with Ernest Billings, RN and reported that the pt "got choked" a lot during breakfast. She cited difficulty with puree solids, dysphagia 2 solids and with thin liquids. A modified barium swallow study will be conducted today at 1430 to further assess swallow function.    HPI HPI: Pt is an 87 y.o. male admitted on 02/02/2020 for drop in O2 sats.  Pt dx with COVID 19 01/24/20.  Pt with significant PMH of PAD, HTN, dementia, DVT, gout, DM, COPD (on 2 L O2 [place] at baseline). CT chest 1/27: Bilateral airspace opacity slightly worse on the right than the left similar to that seen on prior chest x-ray consistent with the given clinical history. CT head 1/30 negative. CXR 1/29: Stable bilateral multifocal pneumonia, RIGHT greater than LEFT. Palliative care has been consulted and per note on 2/1,  pt's son likely would opt against feeding tube placement if ongoing nutritional status decline and is hopeful for recovery.      SLP Plan  Continue with current plan of care       Recommendations  Diet recommendations: Dysphagia 2 (fine chop);Nectar-thick liquid Liquids provided via: Straw;Cup Medication Administration: Crushed with puree Supervision: Staff to assist with self feeding;Intermittent supervision to cue for compensatory strategies Compensations: Small sips/bites;Slow rate                Oral Care Recommendations: Oral care BID Follow up Recommendations:  (TBD) SLP Visit Diagnosis: Dysphagia, unspecified (R13.10) Plan: Continue with current plan of care       Ernest Clark Ernest Clark, [place], [place] Office number [phone] Pager [phone]                Ernest Clark 02/04/2020, 10:29 AM

## 2020-02-05 ENCOUNTER — Inpatient Hospital Stay (HOSPITAL_COMMUNITY): Payer: Medicare Other

## 2020-02-05 DIAGNOSIS — J9602 Acute respiratory failure with hypercapnia: Secondary | ICD-10-CM | POA: Diagnosis not present

## 2020-02-05 DIAGNOSIS — R0600 Dyspnea, unspecified: Secondary | ICD-10-CM

## 2020-02-05 DIAGNOSIS — Z515 Encounter for palliative care: Secondary | ICD-10-CM | POA: Diagnosis not present

## 2020-02-05 DIAGNOSIS — J189 Pneumonia, unspecified organism: Secondary | ICD-10-CM | POA: Diagnosis not present

## 2020-02-05 DIAGNOSIS — U071 COVID-19: Secondary | ICD-10-CM | POA: Diagnosis not present

## 2020-02-05 DIAGNOSIS — J9601 Acute respiratory failure with hypoxia: Secondary | ICD-10-CM | POA: Diagnosis not present

## 2020-02-05 LAB — GLUCOSE, CAPILLARY
Glucose-Capillary: 199 mg/dL — ABNORMAL HIGH (ref 70–99)
Glucose-Capillary: 165 mg/dL — ABNORMAL HIGH (ref 70–99)

## 2020-02-05 LAB — PROCALCITONIN: Procalcitonin: <0.1 ng/mL

## 2020-02-05 MED ORDER — FUROSEMIDE 10 MG/ML IJ SOLN
40.0000 mg | Freq: Once | INTRAMUSCULAR | Status: AC
  Administered 2020-02-05: 40 mg via INTRAVENOUS
  Filled 2020-02-05: qty 4

## 2020-02-05 MED ORDER — LORAZEPAM 2 MG/ML IJ SOLN
0.5000 mg | INTRAMUSCULAR | Status: DC | PRN
  Administered 2020-02-05: 0.5 mg via INTRAVENOUS
  Filled 2020-02-05: qty 1

## 2020-02-05 MED ORDER — BISACODYL 10 MG RE SUPP: 10.0000 mg | Freq: Every day | RECTAL | Status: DC | PRN

## 2020-02-05 MED ORDER — RESOURCE THICKENUP CLEAR PO POWD: ORAL | Status: AC

## 2020-02-05 MED ORDER — ALBUTEROL SULFATE (2.5 MG/3ML) 0.083% IN NEBU
INHALATION_SOLUTION | RESPIRATORY_TRACT | Status: AC
  Filled 2020-02-05: qty 3

## 2020-02-05 MED ORDER — MORPHINE SULFATE (PF) 2 MG/ML IV SOLN
1.0000 mg | INTRAVENOUS | Status: DC | PRN
Start: 2020-02-05 — End: 2020-02-06
  Administered 2020-02-05 – 2020-02-06 (×3): 2 mg via INTRAVENOUS
  Filled 2020-02-05 (×3): qty 1

## 2020-02-05 MED ORDER — INSULIN ASPART 100 UNIT/ML ~~LOC~~ SOLN: SUBCUTANEOUS | 0 refills | Status: AC

## 2020-02-05 MED ORDER — GLYCOPYRROLATE 0.2 MG/ML IJ SOLN
0.2000 mg | INTRAMUSCULAR | Status: DC | PRN
  Administered 2020-02-05: 0.2 mg via INTRAVENOUS
  Filled 2020-02-05: qty 1

## 2020-02-05 NOTE — Progress Notes (Signed)
Daily Progress Note   Patient Name: Ernest Clark.       Date: 02/05/2020 DOB: 1931/06/04  Age: 87 y.o. MRN#: [id number] Attending Physician: Thurnell Lose, MD Primary Care Physician: Hulan Fess, MD Admit Date: Feb 08, 2020  Reason for Consultation/Follow-up: Establishing goals of care  Subjective: Patient awake but appears uncomfortable with tachypnea, accessory muscle use. Respiratory distress this AM likely from aspiration requiring 10L [place] and NRB mask. Patient recognizes his son at bedside. He is minimally engaged in conversation but states "I can't breath" multiple times.   GOC: F/u with son, Ernest Clark at bedside. Frankly and compassionately explained concern with worsening condition and respiratory status likely due to aspiration and poor prognosis. Ernest Clark confirms his father's decision for DNR/DNI code status. Discussed medical recommendation for shift to comfort, especially because his father continues to state he can't breath despite high oxygen requirements and breathing treatment this AM. Educated on comfort focused care including medications for symptom management, discontinuation of interventions not aimed at comfort, unrestricted visitor access, comfort feeds. Prepared son for poor prognosis possibly days if not less with risk for ongoing respiratory status decline and aspiration. Son understands and agrees with shift to comfort.  Ernest Clark calls his sister Ernest Clark during visit. She lives in [place]. Discussed diagnoses, interventions, comfort focused care plan and poor prognosis. Ernest Clark will facetime with Ernest Clark to see her father. Unsure if she will travel to see him in person.   Reassured patient and son that I will ask nurse to give prn morphine and robinul now. Son has PMT contact  information and understands he can call with questions or concerns this afternoon.   Length of Stay: 7  Current Medications: Scheduled Meds:  . albuterol      . aspirin EC  81 mg Oral Daily  . DULoxetine  40 mg Oral Daily  . enoxaparin (LOVENOX) injection  40 mg Subcutaneous Q24H  . feeding supplement (GLUCERNA SHAKE)  237 mL Oral TID BM  . levothyroxine  50 mcg Oral QAC breakfast  . methylPREDNISolone (SOLU-MEDROL) injection  40 mg Intravenous Daily  . pantoprazole  40 mg Oral Q1200  . rosuvastatin  40 mg Oral Daily  . umeclidinium-vilanterol  1 puff Inhalation Daily    Continuous Infusions:   PRN Meds: haloperidol lactate, Ipratropium-Albuterol, Resource ThickenUp Clear  Physical Exam Vitals and nursing note reviewed.  Constitutional:      General: He is awake.     Appearance: He is cachectic. He is ill-appearing.  HENT:     Head: Normocephalic and atraumatic.  Cardiovascular:     Rate and Rhythm: Regular rhythm.  Pulmonary:     Effort: Tachypnea and accessory muscle usage present. No respiratory distress.     Breath sounds: Rhonchi present.     Comments: 10L , NRB mask Skin:    General: Skin is warm and dry.     Coloration: Skin is pale.  Neurological:     Mental Status: He is alert.     Comments: Awake, alert but confused. He does recognize son at bedside. States "I can't breath"  Psychiatric:        Attention and Perception: He is inattentive.        Speech: Speech is delayed.        Cognition and Memory: Cognition is impaired.            Vital Signs: BP (!) 155/94 (BP Location: Right Arm)   Pulse (!) 101   Temp 98.3 F (36.8 C)   Resp (!) 22   Wt 95.7 kg   SpO2 90%   BMI 32.08 kg/m  SpO2: SpO2: 90 % O2 Device: O2 Device: High Flow Nasal Cannula,NRB O2 Flow Rate: O2 Flow Rate (L/min): 10 L/min  Intake/output summary:   Intake/Output Summary (Last 24 hours) at 02/05/2020 [id number] Last data filed at 02/05/2020 0331 Gross per 24 hour  Intake 120 ml   Output 1400 ml  Net -1280 ml   LBM: Last BM Date: 01/30/20 Baseline Weight: Weight: 95.7 kg Most recent weight: Weight: 95.7 kg       Palliative Assessment/Data: PPS 10%      Patient Active Problem List   Diagnosis Date Noted  . HCAP (healthcare-associated pneumonia) 02/01/2020  . Hypotension 01/22/2020  . COVID-19 virus infection 01/16/2020  . Diabetes mellitus type 2, uncontrolled ([place]) 01/30/2020  . DNR (do not resuscitate) 02/01/2020  . Hypothyroid 01/06/2020  . Palliative care encounter   . Elevated troponin   . Acute respiratory failure with hypoxia and hypercapnia (HCC)   . Hav (hallux abducto valgus), left 04/15/2019  . Hav (hallux abducto valgus), right 04/15/2019  . Nail, injury by, initial encounter 10/15/2018  . Pain due to onychomycosis of toenails of both feet 07/09/2018  . Type 2 diabetes mellitus with vascular disease ([place]) 07/09/2018  . Allergic rhinitis 03/08/2017  . Solitary pulmonary nodule 03/08/2017  . Hyponatremia 07/04/2016  . Type 2 diabetes mellitus without complication, with long-term current use of insulin ([place]) 07/04/2016  . Multiple rib fractures 07/04/2016  . Peripheral arterial disease ([place]) 09/25/2013  . Coronary heart disease 09/25/2013  . Essential hypertension 09/25/2013  . Hyperlipidemia 09/25/2013  . COPD (chronic obstructive pulmonary disease) ([place]) 12/09/2010    Palliative Care Assessment & Plan   Patient Profile: 87 y.o. male  with past medical history of CAD s/p PCI, PAD, HTN, HLD, COPD on home oxygen 2L, DVT admitted on 01/28/2020 with hypoxia and +COVID from SNF. Hospital admission for acute on chronic hypoxic and hypercarbic respiratory failure due to HCAP and COVID 19 infection. Receiving medical management. Antibiotics discontinued on 1/30. Delirium/acute metabolic encephalopathy superimposed on dementia. Head CT negative for acute findings. Palliative medicine consultation for goals of care.   Assessment: Acute on  chronic respiratory failure COVID-19 infection Bacterial/HCAP, ? Aspiration pneumonia Sepsis Acute metabolic encephalopathy  Delirium superimposed on dementia Hx of COPD Hx of CAD/PAD Hx of DM type 2  Recommendations/Plan: Clinical decline this AM. Patient with respiratory distress on 10L [place] and 15L NRB mask. Remains uncomfortable despite breathing treatment and oxygen.  F/u GOC with son, Ernest Clark at bedside. Son confirms DNR/DNI. Son agrees with medical recommendation for shift to comfort measures, understanding his father's condition and prognosis. Comfort meds added to [facility]. Continue current oxygen requirement. ? Daughter may come from [place]. Comfort feeds per patient/family request. Discussed aspiration risk and precautions.  Unrestricted visitor access. Continue frequent oral care and repositioning.  PMT will follow and assist with symptom management.   Code Status: DNR/DNI   Code Status Orders  (From admission, onward)         Start     Ordered   01/05/2020 0800  Do not attempt resuscitation (DNR)  Continuous       Question Answer Comment  In the event of cardiac or respiratory ARREST Do not call a "code blue"   In the event of cardiac or respiratory ARREST Do not perform Intubation, CPR, defibrillation or ACLS   In the event of cardiac or respiratory ARREST Use medication by any route, position, wound care, and other measures to relive pain and suffering. May use oxygen, suction and manual treatment of airway obstruction as needed for comfort.      01/10/2020 0805        Code Status History    Date Active Date Inactive Code Status Order ID Comments User Context   09/17/2019 1610 01/07/2020 0237 DNR [id number]  Collene Gobble, MD Outpatient   07/14/2019 1310 07/22/2019 2244 DNR [id number]  Mckinley Jewel, MD ED   07/14/2019 1249 07/14/2019 1310 Full Code [id number]  Mckinley Jewel, MD ED   07/04/2016 2154 07/08/2016 1931 Full Code [id number]  Danford, Suann Larry, MD  Inpatient   Advance Care Planning Activity      Prognosis: Poor prognosis: possibly days following aspiration event this morning. Acute respiratory failure on NRB and 10NC.  Discharge Planning: To be determined:   Care plan was discussed with Dr. Candiss Norse, RN, son [place])   Thank you for allowing the Palliative Medicine Team to assist in the care of this patient.   Total Time 60 Prolonged Time Billed  no    Greater than 50% of this time was spent counseling and coordinating care related to the above assessment and plan.   Ihor Dow, DNP, FNP-C Palliative Medicine Team  Phone: [phone] Fax: [phone]  Please contact Palliative Medicine Team phone at [phone] for questions and concerns.

## 2020-02-05 NOTE — Progress Notes (Signed)
RN called for RT to come to bedside. Upon arrival patient on NRB sating 88%. Per RN patient had desat to the 70's. RT placed patient on HFNC 10L and NRB. Patient currently sating 90%. RN aware and at bedside. RT will continue to monitor as needed.

## 2020-02-05 NOTE — Progress Notes (Signed)
Entered patient room to feed. Patient found to be diaphoretic and struggling to breathe on 4L [place]. O2 saturation 66%. Patient placed 15 L NRB, gave patient a emergent breathing treatment, split in additional 15L [place], after approximately 20 minutes patient O2 saturation is 87%. Lungs coarse crackles. Respiratory paged to come to bedside. MD Candiss Norse paged.

## 2020-02-05 NOTE — Progress Notes (Signed)
Post discharge patient has aspirated, per discussions between palliative care, treatment team and patient's son patient now transition to full comfort measures.  Likely will pass away here if survives for the next few days will look for residential hospice.  Discharge to SNF with comfort measures/hospice is held.

## 2020-02-05 NOTE — Discharge Instructions (Signed)
Follow with Primary MD Hulan Fess, MD in 7 days   Get CBC, CMP, 2 view Chest X ray -  checked next visit within 1 week by Primary MD or SNF MD   Activity: As tolerated with Full fall precautions use walker/cane & assistance as needed  Disposition SNF  Diet: Soft diet with nectar thick liquids, full feeding assistance and aspiration precautions.  Special Instructions: If you have smoked or chewed Tobacco  in the last 2 yrs please stop smoking, stop any regular Alcohol  and or any Recreational drug use.  On your next visit with your primary care physician please Get Medicines reviewed and adjusted.  Please request your Prim.MD to go over all Hospital Tests and Procedure/Radiological results at the follow up, please get all Hospital records sent to your Prim MD by signing hospital release before you go home.  If you experience worsening of your admission symptoms, develop shortness of breath, life threatening emergency, suicidal or homicidal thoughts you must seek medical attention immediately by calling 911 or calling your MD immediately  if symptoms less severe.

## 2020-02-05 NOTE — Discharge Summary (Signed)
Ernest Clark. [id number] DOB: 01-08-1931 DOA: 01/17/2020  PCP: Hulan Fess, MD  Admit date: 01/23/2020  Discharge date: 02/05/2020  Admitted From: SNF  Disposition:  SNF - if declines Hospice   Recommendations for Outpatient Follow-up:   Follow up with PCP in 1-2 weeks  PCP Please obtain BMP/CBC, 2 view CXR in 1week,  (see Discharge instructions)   PCP Please follow up on the following pending results:    Home Health: None   Equipment/Devices: None  Consultations: Pall.Care Discharge Condition: Stable    CODE STATUS: Full    Diet Recommendation:   Diet Order            DIET DYS 2 Room service appropriate? No; Fluid consistency: Nectar Thick  Diet effective now                  Chief Complaint  Patient presents with  . Covid Positive     Brief history of present illness from the day of admission and additional interim summary    Patient is a 87 y.o. male with PMHx of CAD s/p PCI, PAD, HTN, HLD, COPD on home O2 2 L/min, DVT-presented to the hospital with acute on chronic hypoxic and hypercarbic respiratory failure due to HCAP/bacterial and COVID-19 infection.  COVID-19 vaccinated status: Vaccinated  Significant Events:  1/27>> Admit to [facility] for acute hypoxic/hypercarbic respiratory failure-HCAP/COVID-19 infection  Significant studies: 1/27>> CTA chest: No PE, bilateral airspace opacity-right>> left   COVID-19 medications: Steroids: 1/27>> Remdesivir: 1/27>>  Antibiotics: Rocephin: 1/27>> Zithromax: 1/27>>  Microbiology data: 1/27 >>blood culture: No growth                                                                 Hospital Course   Acute on chronic hypoxemic and hypercarbic respiratory failure: Suspect this is mostly from bacterial/HCAP-likely aspiration pneumonia-could  have some element of COVID-19 infection.  Seems to have improved compared to on admission-back on usual home regimen of 2 L of oxygen now between 4-5 liters per minute, part of it is due to his encephalopathy causing agitation, more movements and more oxygen demand .    Has finished his steroid and remdesivir treatment.  All prognosis is extremely guarded, he still has considerable metabolic encephalopathy from the COVID-19 infection on top of his mild dementia,  oral intake is limited and minimal, long-term prognosis is bleak, if no further improvement at SNF focus on comfort care and transition to hospice    Recent Labs  Lab 01/10/2020 0952 01/11/2020 1139 01/26/2020 1258 01/16/2020 1259 01/26/2020 1325 01/30/20 0516 01/31/20 0852 02/01/20 0049 02/02/20 0116 02/03/20 0222 02/04/20 0445 02/05/20 0239  WBC  --   --   --   --   --    < > 11.2* 8.7 7.0 7.3 8.2  --  CRP  --   --   --   --   --   --  7.6* 5.3* 3.2* 2.0* 1.7*  --   DDIMER  --   --   --   --   --   --  0.69* 0.48 0.59* 0.57* 0.61*  --   BNP  --   --   --   --   --   --  531.7* 451.7* 319.4* 165.4* 108.7*  --   PROCALCITON  --   --   --   --   --    < > <0.10 <0.10 <0.10 <0.10 <0.10 <0.10  LATICACIDVEN 1.1 1.6  --  2.2*  --   --   --   --   --   --   --   --   AST  --   --  31  --   --   --   --  32 23 18 19   --   ALT  --   --  28  --   --   --   --  31 25 23 18   --   ALKPHOS  --   --  90  --   --   --   --  80 73 76 69  --   BILITOT  --   --  0.6  --   --   --   --  0.7 0.8 1.0 1.0  --   ALBUMIN  --   --  2.6*  --   --   --   --  2.7* 2.6* 2.7* 2.5*  --   SARSCOV2NAA  --   --   --   --  POSITIVE*  --   --   --   --   --   --   --    < > = values in this interval not displayed.       Sepsis due to underlying COVID-19 pneumonia: Secondary to PNA-sepsis physiology has resolved.  No bacterial infection.  Delirium/acute metabolic encephalopathy superimposed on dementia: Suspect delirium/metabolic encephalopathy due to  hypoxemia/pneumonia.  On home dose Cymbalta and Remeron, received Haldol early morning 02/01/2020 and extremely somnolent after that, -ve head CT .  Continue supportive care, minimize benzodiazepines and narcotics as much as possible.  Since he is somnolent gentle hydration with IV fluids to continue, negative care is following, I do not see any meaningful improvement in his mentation, although from the time of admission he is mildly improved but I do not think he has shown any meaningful recovery.  Palliative care was involved and detailed discussions with son made, goal is to send to SNF with palliative care assistant at [facility].  If he declines further full comfort care/hospice.  CAD/PAD: All stable-continue aspirin/Crestor  COPD: Not in flare-continue bronchodilators  Hypothyroidism: Continue levothyroxine  Obesity: BMI 32.  Follow with PCP.  DM-2: CBG stable-continue SSI  Discharge diagnosis     Principal Problem:   Acute respiratory failure with hypoxia and hypercapnia (HCC) Active Problems:   HCAP (healthcare-associated pneumonia)   Hypotension   COVID-19 virus infection   Diabetes mellitus type 2, uncontrolled ([place])   DNR (do not resuscitate)   Hypothyroid    Discharge instructions    Discharge Instructions    Discharge instructions   Complete by: As directed    Follow with Primary MD Hulan Fess, MD in 7 days   Get CBC, CMP, 2 view Chest X ray -  checked next visit within 1 week by Primary MD or SNF MD   Activity: As tolerated with Full fall precautions use walker/cane & assistance as needed  Disposition SNF  Diet: Soft diet with nectar thick liquids, full feeding assistance and aspiration precautions.  Special Instructions: If you have smoked or chewed Tobacco  in the last 2 yrs please stop smoking, stop any regular Alcohol  and or any Recreational drug use.  On your next visit with your primary care physician please Get Medicines reviewed and  adjusted.  Please request your Prim.MD to go over all Hospital Tests and Procedure/Radiological results at the follow up, please get all Hospital records sent to your Prim MD by signing hospital release before you go home.  If you experience worsening of your admission symptoms, develop shortness of breath, life threatening emergency, suicidal or homicidal thoughts you must seek medical attention immediately by calling 911 or calling your MD immediately  if symptoms less severe.   Increase activity slowly   Complete by: As directed       Discharge Medications   Allergies as of 02/05/2020      Reactions   Actos [pioglitazone Hydrochloride] Other (See Comments)   RIGHT LEG SWELLING   Angiotensin Receptor Blockers Cough   Furosemide Other (See Comments)    Hyponatremia. Pt is taking furosemide now   Vicodin Hp [hydrocodone-acetaminophen] Other (See Comments)   Unknown reaction   Metformin And Related Other (See Comments)   Reaction unknown      Medication List    STOP taking these medications   guaiFENesin 600 MG 12 hr tablet Commonly known as: MUCINEX   Iron 325 (65 Fe) MG Tabs   mirtazapine 7.5 MG tablet Commonly known as: REMERON   montelukast 10 MG tablet Commonly known as: SINGULAIR   traZODone 50 MG tablet Commonly known as: DESYREL   Tresiba FlexTouch 100 UNIT/ML FlexTouch Pen Generic drug: insulin degludec   vitamin C with rose hips 500 MG tablet   zinc sulfate 220 (50 Zn) MG capsule     TAKE these medications   Acetaminophen 500 MG capsule Take 1,000 mg by mouth every 4 (four) hours as needed for pain.   albuterol 108 (90 Base) MCG/ACT inhaler Commonly known as: VENTOLIN HFA   albuterol 0.63 MG/3ML nebulizer solution Commonly known as: ACCUNEB Take 1 ampule by nebulization every 6 (six) hours as needed for wheezing.   allopurinol 100 MG tablet Commonly known as: ZYLOPRIM Take 100 mg by mouth daily.   Anoro Ellipta 62.5-25 MCG/INH Aepb Generic  drug: umeclidinium-vilanterol USE 1 INHALATION ORALLY ONE TIME DAILY What changed: See the new instructions.   aspirin 81 MG tablet Take 81 mg by mouth daily.   BD Pen Needle Nano U/F 32G X 4 MM Misc Generic drug: Insulin Pen Needle   cholecalciferol 25 MCG (1000 UNIT) tablet Commonly known as: VITAMIN D3 Take 1,000 Units by mouth daily.   docusate sodium 100 MG capsule Commonly known as: COLACE Take 200 mg by mouth 2 (two) times daily.   DULoxetine HCl 40 MG Cpep Take 40 mg by mouth daily.   Flovent Diskus 100 MCG/BLIST Aepb Generic drug: Fluticasone Propionate (Inhal) Inhale 1 puff into the lungs 2 (two) times daily.   furosemide 20 MG tablet Commonly known as: LASIX Take 10 mg by mouth daily.   insulin aspart 100 UNIT/ML injection Commonly known as: NovoLOG Substitute to any brand approved.Before each meal 3 times a day, 140-199 - 2 units, 200-250 -  4 units, 251-299 - 6 units,  300-349 - 8 units,  350 or above 10 units. Dispense syringes and needles as needed, Ok to switch to PEN if approved. DX DM2, Code E11.65   Combivent Respimat 20-100 MCG/ACT Aers respimat Generic drug: Ipratropium-Albuterol Inhale 1 puff into the lungs in the morning and at bedtime.   ipratropium-albuterol 0.5-2.5 (3) MG/3ML Soln Commonly known as: DUONEB Take 3 mLs by nebulization every 6 (six) hours as needed.   levothyroxine 50 MCG tablet Commonly known as: SYNTHROID Take 50 mcg by mouth daily before breakfast.   Melatonin 5 MG Caps Take 5 mg by mouth at bedtime.   omeprazole 20 MG capsule Commonly known as: PRILOSEC Take 20 mg by mouth 2 (two) times daily.   polyethylene glycol 17 g packet Commonly known as: MIRALAX / GLYCOLAX Take 17 g by mouth daily as needed for moderate constipation.   Potassium Chloride ER 20 MEQ Tbcr Take 20 mEq by mouth daily.   Resource ThickenUp Clear Powd Use as needed to make fluids nectar thick consistency.   rosuvastatin 40 MG tablet Commonly  known as: CRESTOR Take 40 mg by mouth daily.   sodium chloride 0.65 % Soln nasal spray Commonly known as: OCEAN Place 2 sprays into both nostrils every 4 (four) hours.        Contact information for follow-up providers    Little, Lennette Bihari, MD. Schedule an appointment as soon as possible for a visit in 1 week(s).   Specialty: Family Medicine Contact information: [place] [address] [phone]        Lorretta Harp, MD .   Specialties: Cardiology, Radiology Contact information: [address] [place]  [phone]            Contact information for after-discharge care    Destination    [facility] Preferred SNF .   Service: Skilled Nursing Contact information: [place] [phone]                  Major procedures and Radiology Reports - PLEASE review detailed and final reports thoroughly  -       CT HEAD WO CONTRAST  Result Date: 02/01/2020 CLINICAL DATA:  Delirium EXAM: CT HEAD WITHOUT CONTRAST TECHNIQUE: Contiguous axial images were obtained from the base of the skull through the vertex without intravenous contrast. COMPARISON:  CT head 07/14/2019 FINDINGS: Brain: Image quality degraded by extensive motion Generalized atrophy.  No acute infarct, hemorrhage, mass Vascular: Carotid and vertebral artery calcification. Negative for hyperdense vessel Skull: Negative Sinuses/Orbits: Mucosal edema paranasal sinuses. Bilateral cataract extraction Other: None IMPRESSION: Image quality degraded by extensive motion. No acute abnormality identified Electronically Signed   By: Franchot Gallo M.D.   On: 02/01/2020 14:56   CT Angio Chest PE W and/or Wo Contrast  Result Date: 2020-02-28 CLINICAL DATA:  Positive D-dimer and COVID-19 pneumonia, initial encounter EXAM: CT ANGIOGRAPHY CHEST WITH CONTRAST TECHNIQUE: Multidetector CT imaging of the chest was performed using the  standard protocol during bolus administration of intravenous contrast. Multiplanar CT image reconstructions and MIPs were obtained to evaluate the vascular anatomy. CONTRAST:  1mL OMNIPAQUE IOHEXOL 350 MG/ML SOLN COMPARISON:  Chest x-ray from earlier in the same day, CT from 07/03/2017 FINDINGS: Cardiovascular: Thoracic aorta and its branches demonstrate atherosclerotic calcifications. No aneurysmal dilatation is seen. The degree of opacification of the aorta is limited. No cardiac enlargement is seen. Mitral and aortic valve calcifications are seen.  Heavy coronary calcifications are noted. The pulmonary artery shows a normal branching pattern. No filling defect to suggest pulmonary embolism is noted. Mediastinum/Nodes: Thoracic inlet is within normal limits. No sizable hilar or mediastinal adenopathy is noted. Small precarinal nodes are seen. The esophagus as visualized is within normal limits. Lungs/Pleura: Lungs are well aerated bilaterally with patchy airspace opacities primarily in the lower lobes with some volume loss on the right. No sizable effusion or pneumothorax is seen. No parenchymal nodules are noted. Upper Abdomen: Visualized upper abdomen is within normal limits. Musculoskeletal: Degenerative changes of the thoracic spine are noted. No acute rib abnormality is seen. Review of the MIP images confirms the above findings. IMPRESSION: No evidence of pulmonary emboli. Bilateral airspace opacity slightly worse on the right than the left similar to that seen on prior chest x-ray consistent with the given clinical history. Aortic Atherosclerosis (ICD10-I70.0). Electronically Signed   By: Inez Catalina M.D.   On: 01/30/2020 10:58   DG Chest Port 1 View  Result Date: 01/31/2020 CLINICAL DATA:  Shortness of breath.  COVID positive. EXAM: PORTABLE CHEST 1 VIEW COMPARISON:  Chest x-rays dated 01/20/2020 and 07/20/2019. Chest CT dated 01/17/2020. FINDINGS: Bilateral airspace opacities, RIGHT greater than LEFT,  not significantly changed. Heart size and mediastinal contours appear stable. No pleural effusion or pneumothorax is seen. IMPRESSION: Stable bilateral multifocal pneumonia, RIGHT greater than LEFT. Electronically Signed   By: Franki Cabot M.D.   On: 01/31/2020 09:04   DG Chest Port 1 View  Result Date: 01/23/2020 CLINICAL DATA:  COVID pneumonia, dyspnea EXAM: PORTABLE CHEST 1 VIEW COMPARISON:  07/20/2019 FINDINGS: The lungs are symmetrically inflated. Elevation of the left hemidiaphragm is again noted. There is right mid and lower lung zone consolidation, new since prior examination, possibly infectious in the acute setting. Note that aspiration or pulmonary hemorrhage could appear similarly. Mild left basilar atelectasis or infiltrate noted. No pneumothorax or pleural effusion. Mild cardiomegaly is unchanged. IMPRESSION: Right mid and lower lung zone consolidation, in keeping with acute lobar pneumonia in the appropriate clinical setting. Probable left basilar atelectasis with associated stable elevation of the left hemidiaphragm. Electronically Signed   By: Fidela Salisbury MD   On: 01/16/2020 05:06    Micro Results     Recent Results (from the past 240 hour(s))  Culture, blood (x 2)     Status: None   Collection Time: 01/31/2020  8:00 AM   Specimen: BLOOD RIGHT FOREARM  Result Value Ref Range Status   Specimen Description BLOOD RIGHT FOREARM  Final   Special Requests   Final    BOTTLES DRAWN AEROBIC AND ANAEROBIC Blood Culture results may not be optimal due to an excessive volume of blood received in culture bottles   Culture   Final    NO GROWTH 5 DAYS Performed at [facility], [place] [address] [place], [place] [address]    Report Status 02/03/2020 FINAL  Final  Culture, blood (x 2)     Status: None   Collection Time: 01/12/2020 12:58 PM   Specimen: BLOOD  Result Value Ref Range Status   Specimen Description BLOOD LEFT ANTECUBITAL  Final   Special Requests   Final    BOTTLES DRAWN  AEROBIC AND ANAEROBIC Blood Culture adequate volume   Culture   Final    NO GROWTH 5 DAYS Performed at [facility], [place] [address], [place],  [address]    Report Status 02/03/2020 FINAL  Final  SARS CORONAVIRUS 2 (TAT  6-24 HRS) Nasopharyngeal Nasopharyngeal Swab     Status: Abnormal   Collection Time: 01/28/2020  1:25 PM   Specimen: Nasopharyngeal Swab  Result Value Ref Range Status   SARS Coronavirus 2 POSITIVE (A) NEGATIVE Final    Comment: (NOTE) SARS-CoV-2 target nucleic acids are DETECTED.  The SARS-CoV-2 RNA is generally detectable in upper and lower respiratory specimens during the acute phase of infection. Positive results are indicative of the presence of SARS-CoV-2 RNA. Clinical correlation with patient history and other diagnostic information is  necessary to determine patient infection status. Positive results do not rule out bacterial infection or co-infection with other viruses.  The expected result is Negative.  Fact Sheet for Patients: SugarRoll.be  Fact Sheet for Healthcare Providers: https://www.woods-mathews.com/  This test is not yet approved or cleared by the [place] FDA and  has been authorized for detection and/or diagnosis of SARS-CoV-2 by FDA under an Emergency Use Authorization (EUA). This EUA will remain  in effect (meaning this test can be used) for the duration of the COVID-19 declaration under Section 564(b)(1) of the Act, 21 U. S.C. section 360bbb-3(b)(1), unless the authorization is terminated or revoked sooner.   Performed at [facility], [place] [address] [place], [place] [address]     Today   Subjective    Aspyn Schrag today in bed appears to be in no distress but remains confused, unable to reliably answer questions or follow commands   Objective   Blood pressure 129/73, pulse 73, temperature 98.2 F (36.8 C), temperature source Oral, resp. rate 18, weight 95.7 kg,  SpO2 100 %.   Intake/Output Summary (Last 24 hours) at 02/05/2020 0846 Last data filed at 02/05/2020 0331 Gross per 24 hour  Intake 120 ml  Output 1400 ml  Net -1280 ml    Exam  Awake remains confused, appears quite tired and lethargic, [place].AT,PERRAL Supple Neck,No JVD, No cervical lymphadenopathy appriciated.  Symmetrical Chest wall movement, Good air movement bilaterally, CTAB RRR,No Gallops,Rubs or new Murmurs, No Parasternal Heave +ve B.Sounds, Abd Soft, Non tender, No organomegaly appriciated, No rebound -guarding or rigidity. No Cyanosis, Clubbing or edema, No new Rash or bruise   Data Review   CBC w Diff:  Lab Results  Component Value Date   WBC 8.2 02/04/2020   HGB 12.2 (L) 02/04/2020   HCT 43.8 02/04/2020   PLT 229 02/04/2020   LYMPHOPCT 9 02/04/2020   MONOPCT 7 02/04/2020   EOSPCT 1 02/04/2020   BASOPCT 1 02/04/2020    CMP:  Lab Results  Component Value Date   NA 144 02/04/2020   K 3.5 02/04/2020   CL 98 02/04/2020   CO2 39 (H) 02/04/2020   BUN 16 02/04/2020   CREATININE 1.00 02/04/2020   PROT 5.5 (L) 02/04/2020   ALBUMIN 2.5 (L) 02/04/2020   BILITOT 1.0 02/04/2020   ALKPHOS 69 02/04/2020   AST 19 02/04/2020   ALT 18 02/04/2020  .   Total Time in preparing paper work, data evaluation and todays exam - 25 minutes  Lala Lund M.D on 02/05/2020 at 8:46 AM  [facility]

## 2020-02-05 NOTE — Plan of Care (Signed)
  Problem: Education: Goal: Knowledge of General Education information will improve Description: Including pain rating scale, medication(s)/side effects and non-pharmacologic comfort measures Outcome: Not Applicable   Problem: Health Behavior/Discharge Planning: Goal: Ability to manage health-related needs will improve Outcome: Not Applicable   Problem: Clinical Measurements: Goal: Ability to maintain clinical measurements within normal limits will improve Outcome: Not Applicable Goal: Will remain free from infection Outcome: Not Applicable Goal: Diagnostic test results will improve Outcome: Not Applicable Goal: Respiratory complications will improve Outcome: Not Applicable Goal: Cardiovascular complication will be avoided Outcome: Not Applicable   Problem: Activity: Goal: Risk for activity intolerance will decrease Outcome: Not Applicable   Problem: Nutrition: Goal: Adequate nutrition will be maintained Outcome: Not Applicable   Problem: Coping: Goal: Level of anxiety will decrease Outcome: Not Applicable   Problem: Elimination: Goal: Will not experience complications related to bowel motility Outcome: Not Applicable Goal: Will not experience complications related to urinary retention Outcome: Not Applicable   Problem: Pain Managment: Goal: General experience of comfort will improve Outcome: Not Applicable   Problem: Safety: Goal: Ability to remain free from injury will improve Outcome: Not Applicable   Problem: Skin Integrity: Goal: Risk for impaired skin integrity will decrease Outcome: Not Applicable   Problem: Education: Goal: Knowledge of risk factors and measures for prevention of condition will improve Outcome: Not Applicable   Problem: Respiratory: Goal: Will maintain a patent airway Outcome: Not Applicable Goal: Complications related to the disease process, condition or treatment will be avoided or minimized Outcome: Not Applicable

## 2020-02-06 DIAGNOSIS — Z515 Encounter for palliative care: Secondary | ICD-10-CM

## 2020-02-06 DIAGNOSIS — J9602 Acute respiratory failure with hypercapnia: Secondary | ICD-10-CM | POA: Diagnosis not present

## 2020-02-06 DIAGNOSIS — U071 COVID-19: Secondary | ICD-10-CM | POA: Diagnosis not present

## 2020-02-06 DIAGNOSIS — J189 Pneumonia, unspecified organism: Secondary | ICD-10-CM | POA: Diagnosis not present

## 2020-02-06 DIAGNOSIS — J9601 Acute respiratory failure with hypoxia: Secondary | ICD-10-CM | POA: Diagnosis not present

## 2020-02-06 DIAGNOSIS — R0600 Dyspnea, unspecified: Secondary | ICD-10-CM

## 2020-02-06 MED ORDER — GLYCOPYRROLATE 0.2 MG/ML IJ SOLN
0.2000 mg | Freq: Four times a day (QID) | INTRAMUSCULAR | Status: DC
  Administered 2020-02-06: 0.2 mg via INTRAVENOUS
  Filled 2020-02-06: qty 1

## 2020-02-06 MED ORDER — MORPHINE SULFATE (PF) 2 MG/ML IV SOLN
1.0000 mg | Freq: Four times a day (QID) | INTRAVENOUS | Status: DC
  Administered 2020-02-06: 1 mg via INTRAVENOUS
  Filled 2020-02-06: qty 1

## 2020-03-02 NOTE — Progress Notes (Signed)
Daily Progress Note   Patient Name: Ernest Clark.       Date: 02/27/2020 DOB: [birth year]  Age: 87 y.o. MRN#: [id number] Attending Physician: Thurnell Lose, MD Primary Care Physician: Hulan Fess, MD Admit Date: 02-12-2020  Reason for Consultation/Follow-up: Establishing goals of care  Subjective: Patient drowsy but will wake to voice. He grabs for son's hand. Slight accessory muscle use on 15L HFNC.   GOC: Son, Parth at bedside. Again discussed comfort focused care plan and recommendation for scheduled morphine in order to better control his air hunger and shortness of breath. Once comfortable on morphine, recommended titrating down oxygen in order not to prolong this process for his father. Also discontinuing interventions not aimed at comfort including cardiac monitor, as our focus is on symptoms and comfort instead of numbers. Educated on symptom management medications and EOL expectations. Discussed poor prognosis.   Clyde does share his father would appreciate a visit from chaplain. Order placed.  Answered questions. Support provided.      Length of Stay: 8  Current Medications: Scheduled Meds:  . feeding supplement (GLUCERNA SHAKE)  237 mL Oral TID BM  . glycopyrrolate  0.2 mg Intravenous Q6H  .  morphine injection  1 mg Intravenous Q6H    Continuous Infusions:   PRN Meds: bisacodyl, glycopyrrolate, haloperidol lactate, Ipratropium-Albuterol, LORazepam, morphine injection, Resource ThickenUp Clear  Physical Exam Vitals and nursing note reviewed.  Constitutional:      Appearance: He is cachectic. He is ill-appearing.  HENT:     Head: Normocephalic and atraumatic.  Cardiovascular:     Rate and Rhythm: Regular rhythm.  Pulmonary:     Effort: Accessory muscle  usage present. No tachypnea or respiratory distress.     Breath sounds: Rhonchi present.     Comments: 15L HFNC, will schedule morphine and work on titrating down oxygen Skin:    General: Skin is cool and dry.     Coloration: Skin is pale.  Neurological:     Mental Status: He is easily aroused.     Comments: Drowsy, confused  Psychiatric:        Attention and Perception: He is inattentive.        Speech: Speech is delayed.        Cognition and Memory: Cognition is impaired.  Vital Signs: BP 139/81 (BP Location: Right Arm)   Pulse 97   Temp 97.8 F (36.6 C)   Resp 20   Wt 95.7 kg   SpO2 93%   BMI 32.08 kg/m  SpO2: SpO2: 93 % O2 Device: O2 Device: High Flow Nasal Cannula,NRB O2 Flow Rate: O2 Flow Rate (L/min): 13 L/min  Intake/output summary:   Intake/Output Summary (Last 24 hours) at 02/09/2020 1016 Last data filed at 02/05/2020 1400 Gross per 24 hour  Intake -  Output 600 ml  Net -600 ml   LBM: Last BM Date: 01/30/20 Baseline Weight: Weight: 95.7 kg Most recent weight: Weight: 95.7 kg       Palliative Assessment/Data: PPS 10%      Patient Active Problem List   Diagnosis Date Noted  . HCAP (healthcare-associated pneumonia) 02/01/2020  . Hypotension 02-01-2020  . COVID-19 virus infection 2020-02-01  . Diabetes mellitus type 2, uncontrolled ([place]) February 01, 2020  . DNR (do not resuscitate) 02/01/2020  . Hypothyroid 02-01-20  . Palliative care encounter   . Elevated troponin   . Acute respiratory failure with hypoxia and hypercapnia (HCC)   . Hav (hallux abducto valgus), left 04/15/2019  . Hav (hallux abducto valgus), right 04/15/2019  . Nail, injury by, initial encounter 10/15/2018  . Pain due to onychomycosis of toenails of both feet 07/09/2018  . Type 2 diabetes mellitus with vascular disease ([place]) 07/09/2018  . Allergic rhinitis 03/08/2017  . Solitary pulmonary nodule 03/08/2017  . Hyponatremia 07/04/2016  . Type 2 diabetes mellitus without  complication, with long-term current use of insulin ([place]) 07/04/2016  . Multiple rib fractures 07/04/2016  . Peripheral arterial disease ([place]) 09/25/2013  . Coronary heart disease 09/25/2013  . Essential hypertension 09/25/2013  . Hyperlipidemia 09/25/2013  . COPD (chronic obstructive pulmonary disease) ([place]) 12/09/2010    Palliative Care Assessment & Plan   Patient Profile: 87 y.o. male  with past medical history of CAD s/p PCI, PAD, HTN, HLD, COPD on home oxygen 2L, DVT admitted on February 01, 2020 with hypoxia and +COVID from SNF. Hospital admission for acute on chronic hypoxic and hypercarbic respiratory failure due to HCAP and COVID 19 infection. Receiving medical management. Antibiotics discontinued on 1/30. Delirium/acute metabolic encephalopathy superimposed on dementia. Head CT negative for acute findings. Palliative medicine consultation for goals of care.   Assessment: Acute on chronic respiratory failure COVID-19 infection Bacterial/HCAP, ? Aspiration pneumonia Sepsis Acute metabolic encephalopathy  Delirium superimposed on dementia Hx of COPD Hx of CAD/PAD Hx of DM type 2  Recommendations/Plan: Comfort measures only.  Patient appears slightly uncomfortable this morning. Will schedule morphine and robinul. Continue prn comfort meds on MAR. Unrestricted visitor access. Comfort bits/sips. Aspiration precautions.  Chaplain consult for support and prayer. Encouraged titration of oxygen once patient appears more comfortable on morphine, explaining the prolonging nature of 15L. Son understands and agrees. Discussed with RN.   Code Status: DNR/DNI   Code Status Orders  (From admission, onward)         Start     Ordered   2020-02-01 0800  Do not attempt resuscitation (DNR)  Continuous       Question Answer Comment  In the event of cardiac or respiratory ARREST Do not call a "code blue"   In the event of cardiac or respiratory ARREST Do not perform Intubation, CPR,  defibrillation or ACLS   In the event of cardiac or respiratory ARREST Use medication by any route, position, wound care, and other measures to relive pain and suffering. May  use oxygen, suction and manual treatment of airway obstruction as needed for comfort.      01/20/2020 0805        Code Status History    Date Active Date Inactive Code Status Order ID Comments User Context   09/17/2019 1610 01/15/2020 0237 DNR [id number]  Collene Gobble, MD Outpatient   07/14/2019 1310 07/22/2019 2244 DNR [id number]  Mckinley Jewel, MD ED   07/14/2019 1249 07/14/2019 1310 Full Code [id number]  Mckinley Jewel, MD ED   07/04/2016 2154 07/08/2016 1931 Full Code [id number]  Danford, Suann Larry, MD Inpatient   Advance Care Planning Activity      Prognosis: Poor prognosis: possibly days following aspiration event yesterday morning.   Discharge Planning: Anticipate hospital death once oxygen weaned.  Care plan was discussed with RN, son Bicksler)   Thank you for allowing the Palliative Medicine Team to assist in the care of this patient.   Total Time 20 Prolonged Time Billed  no    Greater than 50% of this time was spent counseling and coordinating care related to the above assessment and plan.   Ihor Dow, DNP, FNP-C Palliative Medicine Team  Phone: [phone] Fax: [phone]  Please contact Palliative Medicine Team phone at [phone] for questions and concerns.

## 2020-03-02 NOTE — Progress Notes (Signed)
PROGRESS NOTE                                                                                                                                                                                                             Patient Demographics:    Ernest Annis, is a 87 y.o. male, DOB - 03-23-1931, SD:[id number]  Outpatient Primary MD for the patient is Hulan Fess, MD   Admit date - 01/15/2020   LOS - 8  Chief Complaint  Patient presents with  . Covid Positive       Brief Narrative: Patient is a 87 y.o. male with PMHx of CAD s/p PCI, PAD, HTN, HLD, COPD on home O2 2 L/min, DVT-presented to the hospital with acute on chronic hypoxic and hypercarbic respiratory failure due to HCAP/bacterial and COVID-19 infection.  Now on full comfort measures.  COVID-19 vaccinated status: Vaccinated  Significant Events:  1/27>> Admit to [facility] for acute hypoxic/hypercarbic respiratory failure-HCAP/COVID-19 infection  Significant studies: 1/27>> CTA chest: No PE, bilateral airspace opacity-right>> left   COVID-19 medications: Steroids: 1/27>> Remdesivir: 1/27>>  Antibiotics: Rocephin: 1/27>> Zithromax: 1/27>>  Microbiology data: 1/27 >>blood culture: No growth  Procedures: None  Consults: PCCM, Pall.Care  DVT prophylaxis:     Subjective:   Patient in bed although in no distress overall appears quite frail, deconditioned and somewhat confused, is able to follow minimal commands at best   Assessment  & Plan :   Patient admitted for acute COVID-19 infection causing pneumonia, respiratory failure and severe encephalopathy.  Despite appropriate treatment he continued to decline and finally aspirated on 02/05/2020 and declined even further.  Palliative care was involved, after discussions with treatment team and son it was decided that patient will be best served on full comfort measures which he is on now, expect passing away soon.   Full comfort measures only.  Only comfort medications on board.    Other medical issues addressed earlier this admission are below.  Now on full comfort measures only and only on comfort medications.      Acute on chronic hypoxemic and hypercarbic respiratory failure: Suspect this is mostly from bacterial/HCAP-likely aspiration pneumonia-could have some element of COVID-19 infection.  Seems to have improved compared to on admission-back on usual home regimen of 2 L of oxygen.  Continue empiric antimicrobial therapy/steroids and Remdesivir.  Await culture  data and narrow antibiotic spectrum accordingly.    Speech therapy following for safe diet.  Currently on nectar thick liquids with dysphagia 2 diet per speech.  O2 requirements:   SpO2: 93 % O2 Flow Rate (L/min): 13 L/min FiO2 (%): 100 %     Recent Labs  Lab 01/31/20 0852 02/01/20 0049 02/02/20 0116 02/03/20 0222 02/04/20 0445 02/05/20 0239  WBC 11.2* 8.7 7.0 7.3 8.2  --   HGB 12.6* 12.5* 12.4* 12.7* 12.2*  --   HCT 42.9 42.4 42.0 42.5 43.8  --   PLT 220 242 235 250 229  --   CRP 7.6* 5.3* 3.2* 2.0* 1.7*  --   BNP 531.7* 451.7* 319.4* 165.4* 108.7*  --   DDIMER 0.69* 0.48 0.59* 0.57* 0.61*  --   PROCALCITON <0.10 <0.10 <0.10 <0.10 <0.10 <0.10  AST  --  32 23 18 19   --   ALT  --  31 25 23 18   --   ALKPHOS  --  80 73 76 69  --   BILITOT  --  0.7 0.8 1.0 1.0  --   ALBUMIN  --  2.7* 2.6* 2.7* 2.5*  --       Prone/Incentive Spirometry: encouraged patient to lie prone for 3-4 hours at a time for a total of 16 hours a day, and to encourage incentive spirometry use 3-4/hour.  Sepsis due to underlying COVID-19 pneumonia: Secondary to PNA-sepsis physiology has resolved.  So far cultures negative procalcitonin is undetectable hence will discontinue antibiotics on 02/01/2020.  Delirium/acute metabolic encephalopathy superimposed on dementia: Suspect delirium/metabolic encephalopathy due to hypoxemia/pneumonia.  On home dose  Cymbalta and Remeron, received Haldol early morning 02/01/2020 and extremely somnolent after that, -ve head CT .  Continue supportive care, minimize benzodiazepines and narcotics as much as possible.  Since he is somnolent gentle hydration with IV fluids to continue, negative care is following, I do not see any meaningful improvement in his mentation, although from the time of admission he is mildly improved but I do not think that he will be able to eat or drink safely with this mental status.  CAD/PAD: All stable-continue aspirin/Crestor  COPD: Not in flare-continue bronchodilators  Hypothyroidism: Continue levothyroxine  Obesity: BMI 32.  Follow with PCP.  DM-2: CBG stable-continue SSI  Lab Results  Component Value Date   HGBA1C 7.0 (H) 01/30/2020   Recent Labs    02/04/20 2312 02/05/20 0304 02/05/20 0741  GLUCAP 251* 199* 165*     Condition - Stable  Family Communication  :  Son - [phone] left voicemail on 01/30/20, left voicemail on 01/31/2020 at 9:27 AM, updated again 02/02/2020 at 11:10 AM, 29-Feb-2020  Code Status :  DNR  Diet :  Diet Order            DIET DYS 2 Room service appropriate? No; Fluid consistency: Nectar Thick  Diet effective now                  Disposition Plan  :   Status is: Inpatient  Remains inpatient appropriate because:Inpatient level of care appropriate due to severity of illness   Dispo: The patient is from: SNF              Anticipated d/c is to: SNF              Anticipated d/c date is: > 3 days              Patient currently is not medically  stable to d/c.   Difficult to place patient No  Barriers to discharge: Hypoxia requiring O2 supplementation/complete 5 days of IV Remdesivir  Antimicorbials  :    Anti-infectives (From admission, onward)   Start     Dose/Rate Route Frequency Ordered Stop   01/30/20 1000  remdesivir 100 mg in sodium chloride 0.9 % 100 mL IVPB       "Followed by" Linked Group Details   100 mg 200 mL/hr over  30 Minutes Intravenous Daily 01/06/2020 1105 02/02/20 0942   01/05/2020 1200  remdesivir 200 mg in sodium chloride 0.9% 250 mL IVPB       "Followed by" Linked Group Details   200 mg 580 mL/hr over 30 Minutes Intravenous Once 01/14/2020 1105 01/22/2020 1405   01/27/2020 0830  cefTRIAXone (ROCEPHIN) 2 g in sodium chloride 0.9 % 100 mL IVPB  Status:  Discontinued        2 g 200 mL/hr over 30 Minutes Intravenous Every 24 hours 01/17/2020 0805 02/01/20 1133   01/28/2020 0815  azithromycin (ZITHROMAX) 500 mg in sodium chloride 0.9 % 250 mL IVPB  Status:  Discontinued        500 mg 250 mL/hr over 60 Minutes Intravenous Every 24 hours 01/25/2020 0805 02/01/20 1133   01/06/2020 0700  ceFEPIme (MAXIPIME) 2 g in sodium chloride 0.9 % 100 mL IVPB  Status:  Discontinued        2 g 200 mL/hr over 30 Minutes Intravenous Every 8 hours 01/30/2020 0656 01/22/2020 0805      Inpatient Medications  Scheduled Meds: . feeding supplement (GLUCERNA SHAKE)  237 mL Oral TID BM  . glycopyrrolate  0.2 mg Intravenous Q6H  .  morphine injection  1 mg Intravenous Q6H   Continuous Infusions:  PRN Meds:.bisacodyl, glycopyrrolate, haloperidol lactate, Ipratropium-Albuterol, LORazepam, morphine injection, Resource ThickenUp Clear   Time Spent in minutes  25  See all Orders from today for further details   Lala Lund M.D on 02/21/20 at 11:30 AM  To page go to [email or web address] - use universal password  [facility] -  Office  [phone]    Objective:   Vitals:   02/05/20 1520 02/05/20 2015 02/05/20 2043 Feb 21, 2020 0820  BP:   139/81 140/76  Pulse:   97 99  Resp:   20 18  Temp:   97.8 F (36.6 C) 97.7 F (36.5 C)  TempSrc:      SpO2: 90% 92% 95% 93%  Weight:        Wt Readings from Last 3 Encounters:  01/09/2020 95.7 kg  09/17/19 95.7 kg  07/22/19 103 kg     Intake/Output Summary (Last 24 hours) at Feb 21, 2020 1130 Last data filed at 02/05/2020 1400 Gross per 24 hour  Intake -  Output 600 ml  Net -600 ml      Physical Exam  Patient awake but minimally responsive, on full comfort measures, appears to be in no distress   Data Review:    CBC Recent Labs  Lab 01/31/20 0852 02/01/20 0049 02/02/20 0116 02/03/20 0222 02/04/20 0445  WBC 11.2* 8.7 7.0 7.3 8.2  HGB 12.6* 12.5* 12.4* 12.7* 12.2*  HCT 42.9 42.4 42.0 42.5 43.8  PLT 220 242 235 250 229  MCV 97.9 97.0 97.0 98.8 102.8*  MCH 28.8 28.6 28.6 29.5 28.6  MCHC 29.4* 29.5* 29.5* 29.9* 27.9*  RDW 16.4* 16.2* 16.2* 16.6* 16.4*  LYMPHSABS  --  0.6* 0.7 1.0 0.8  MONOABS  --  0.7 0.7 0.7 0.6  EOSABS  --  0.0 0.0 0.0 0.0  BASOSABS  --  0.0 0.0 0.1 0.1    Chemistries  Recent Labs  Lab 01/31/20 0852 02/01/20 0049 02/02/20 0116 02/03/20 0222 02/04/20 0445  NA 141 140 142 144 144  K 4.1 3.7 3.7 3.9 3.5  CL 97* 95* 98 97* 98  CO2 31 34* 35* 40* 39*  GLUCOSE 187* 161* 169* 173* 158*  BUN 17 17 17 18 16   CREATININE 0.78 0.85 0.86 1.02 1.00  CALCIUM 8.8* 8.9 8.7* 8.7* 8.8*  MG 2.2 2.2 2.2 2.4 2.4  AST  --  32 23 18 19   ALT  --  31 25 23 18   ALKPHOS  --  80 73 76 69  BILITOT  --  0.7 0.8 1.0 1.0   ------------------------------------------------------------------------------------------------------------------ No results for input(s): CHOL, HDL, LDLCALC, TRIG, CHOLHDL, LDLDIRECT in the last 72 hours.  Lab Results  Component Value Date   HGBA1C 7.0 (H) 01/30/2020   ------------------------------------------------------------------------------------------------------------------ No results for input(s): TSH, T4TOTAL, T3FREE, THYROIDAB in the last 72 hours.  Invalid input(s): FREET3 ------------------------------------------------------------------------------------------------------------------ No results for input(s): VITAMINB12, FOLATE, FERRITIN, TIBC, IRON, RETICCTPCT in the last 72 hours.  Coagulation profile No results for input(s): INR, PROTIME in the last 168 hours.  Recent Labs    02/04/20 0445  DDIMER 0.61*     Cardiac Enzymes No results for input(s): CKMB, TROPONINI, MYOGLOBIN in the last 168 hours.  Invalid input(s): CK ------------------------------------------------------------------------------------------------------------------    Component Value Date/Time   BNP 108.7 (H) 02/04/2020 0445    Micro Results Recent Results (from the past 240 hour(s))  Culture, blood (x 2)     Status: None   Collection Time: 02-26-20  8:00 AM   Specimen: BLOOD RIGHT FOREARM  Result Value Ref Range Status   Specimen Description BLOOD RIGHT FOREARM  Final   Special Requests   Final    BOTTLES DRAWN AEROBIC AND ANAEROBIC Blood Culture results may not be optimal due to an excessive volume of blood received in culture bottles   Culture   Final    NO GROWTH 5 DAYS Performed at [facility], [place] [address] [place], [place] [address]    Report Status 02/03/2020 FINAL  Final  Culture, blood (x 2)     Status: None   Collection Time: 02/26/2020 12:58 PM   Specimen: BLOOD  Result Value Ref Range Status   Specimen Description BLOOD LEFT ANTECUBITAL  Final   Special Requests   Final    BOTTLES DRAWN AEROBIC AND ANAEROBIC Blood Culture adequate volume   Culture   Final    NO GROWTH 5 DAYS Performed at [facility], [place] [address] [place], [place] [address]    Report Status 02/03/2020 FINAL  Final  SARS CORONAVIRUS 2 (TAT 6-24 HRS) Nasopharyngeal Nasopharyngeal Swab     Status: Abnormal   Collection Time: 2020/02/26  1:25 PM   Specimen: Nasopharyngeal Swab  Result Value Ref Range Status   SARS Coronavirus 2 POSITIVE (A) NEGATIVE Final    Comment: (NOTE) SARS-CoV-2 target nucleic acids are DETECTED.  The SARS-CoV-2 RNA is generally detectable in upper and lower respiratory specimens during the acute phase of infection. Positive results are indicative of the presence of SARS-CoV-2 RNA. Clinical correlation with patient history and other diagnostic information is  necessary to determine  patient infection status. Positive results do not rule out bacterial infection or co-infection with other viruses.  The expected result is Negative.  Fact Sheet for Patients: SugarRoll.be  Fact Sheet  for Healthcare Providers: https://www.woods-mathews.com/  This test is not yet approved or cleared by the [place] and  has been authorized for detection and/or diagnosis of SARS-CoV-2 by FDA under an Emergency Use Authorization (EUA). This EUA will remain  in effect (meaning this test can be used) for the duration of the COVID-19 declaration under Section 564(b)(1) of the Act, 21 U. S.C. section 360bbb-3(b)(1), unless the authorization is terminated or revoked sooner.   Performed at [facility], [place] [address], [place], [place] [address]     Radiology Reports CT HEAD WO CONTRAST  Result Date: 02/01/2020 CLINICAL DATA:  Delirium EXAM: CT HEAD WITHOUT CONTRAST TECHNIQUE: Contiguous axial images were obtained from the base of the skull through the vertex without intravenous contrast. COMPARISON:  CT head 07/14/2019 FINDINGS: Brain: Image quality degraded by extensive motion Generalized atrophy.  No acute infarct, hemorrhage, mass Vascular: Carotid and vertebral artery calcification. Negative for hyperdense vessel Skull: Negative Sinuses/Orbits: Mucosal edema paranasal sinuses. Bilateral cataract extraction Other: None IMPRESSION: Image quality degraded by extensive motion. No acute abnormality identified Electronically Signed   By: Franchot Gallo M.D.   On: 02/01/2020 14:56   CT Angio Chest PE W and/or Wo Contrast  Result Date: 01/23/2020 CLINICAL DATA:  Positive D-dimer and COVID-19 pneumonia, initial encounter EXAM: CT ANGIOGRAPHY CHEST WITH CONTRAST TECHNIQUE: Multidetector CT imaging of the chest was performed using the standard protocol during bolus administration of intravenous contrast. Multiplanar CT image reconstructions and  MIPs were obtained to evaluate the vascular anatomy. CONTRAST:  28mL OMNIPAQUE IOHEXOL 350 MG/ML SOLN COMPARISON:  Chest x-ray from earlier in the same day, CT from 07/03/2017 FINDINGS: Cardiovascular: Thoracic aorta and its branches demonstrate atherosclerotic calcifications. No aneurysmal dilatation is seen. The degree of opacification of the aorta is limited. No cardiac enlargement is seen. Mitral and aortic valve calcifications are seen. Heavy coronary calcifications are noted. The pulmonary artery shows a normal branching pattern. No filling defect to suggest pulmonary embolism is noted. Mediastinum/Nodes: Thoracic inlet is within normal limits. No sizable hilar or mediastinal adenopathy is noted. Small precarinal nodes are seen. The esophagus as visualized is within normal limits. Lungs/Pleura: Lungs are well aerated bilaterally with patchy airspace opacities primarily in the lower lobes with some volume loss on the right. No sizable effusion or pneumothorax is seen. No parenchymal nodules are noted. Upper Abdomen: Visualized upper abdomen is within normal limits. Musculoskeletal: Degenerative changes of the thoracic spine are noted. No acute rib abnormality is seen. Review of the MIP images confirms the above findings. IMPRESSION: No evidence of pulmonary emboli. Bilateral airspace opacity slightly worse on the right than the left similar to that seen on prior chest x-ray consistent with the given clinical history. Aortic Atherosclerosis (ICD10-I70.0). Electronically Signed   By: Inez Catalina M.D.   On: 01/16/2020 10:58   DG Chest Port 1 View  Result Date: 02/05/2020 CLINICAL DATA:  Shortness of breath EXAM: PORTABLE CHEST 1 VIEW COMPARISON:  01/31/2020 FINDINGS: Cardiopericardial enlargement. Bilateral pneumonia by recent chest CT. Volumes have worsened and hazy density is increased especially on the left. No pneumothorax. IMPRESSION: Bilateral pneumonia with worsening lung volumes and increased left  chest opacity. Electronically Signed   By: Monte Fantasia M.D.   On: 02/05/2020 09:23   DG Chest Port 1 View  Result Date: 01/31/2020 CLINICAL DATA:  Shortness of breath.  COVID positive. EXAM: PORTABLE CHEST 1 VIEW COMPARISON:  Chest x-rays dated 01/28/2020 and 07/20/2019. Chest CT dated 01/16/2020. FINDINGS: Bilateral airspace  opacities, RIGHT greater than LEFT, not significantly changed. Heart size and mediastinal contours appear stable. No pleural effusion or pneumothorax is seen. IMPRESSION: Stable bilateral multifocal pneumonia, RIGHT greater than LEFT. Electronically Signed   By: Franki Cabot M.D.   On: 01/31/2020 09:04   DG Chest Port 1 View  Result Date: 01/22/2020 CLINICAL DATA:  COVID pneumonia, dyspnea EXAM: PORTABLE CHEST 1 VIEW COMPARISON:  07/20/2019 FINDINGS: The lungs are symmetrically inflated. Elevation of the left hemidiaphragm is again noted. There is right mid and lower lung zone consolidation, new since prior examination, possibly infectious in the acute setting. Note that aspiration or pulmonary hemorrhage could appear similarly. Mild left basilar atelectasis or infiltrate noted. No pneumothorax or pleural effusion. Mild cardiomegaly is unchanged. IMPRESSION: Right mid and lower lung zone consolidation, in keeping with acute lobar pneumonia in the appropriate clinical setting. Probable left basilar atelectasis with associated stable elevation of the left hemidiaphragm. Electronically Signed   By: Fidela Salisbury MD   On: 01/26/2020 05:06

## 2020-03-02 NOTE — Progress Notes (Signed)
This chaplain responded to PMT consult for EOL spiritual care.  Upon arrival the chaplain learned, the Pt had passed and the Pt. son-At[place] is bedside.  The chaplain introduced herself and listened as Ernest Clark shared his life with his father.  Ernest Clark accepted the chaplain's prayer for himself and the Pt.   F/U spiritual care is available as needed.

## 2020-03-02 NOTE — Death Summary Note (Signed)
[facility] Death Note                                                                                                                                                                                               Ernest Clark, is a 87 y.o. male, DOB - 23-Jan-1931, [id number]  Admit date - 01/23/2020   Admitting Physician Norval Morton, MD  Outpatient Primary MD for the patient is Hulan Fess, MD  LOS - 8  Chief Complaint  Patient presents with  . Covid Positive       Notification: Hulan Fess, MD notified of death of February 28, 2020   Date and Time of Death - 28-Feb-2020 @ 12:16 pm  Pronounced by - RN  History of present illness:   Ernest Clark. is a 87 y.o. male  CAD s/p PCI, PAD, HTN, HLD, COPD on home O2 2 L/min, DVT- presented to the hospital with SOB, admitted for acute COVID-19 infection causing pneumonia, respiratory failure and severe encephalopathy.  Despite appropriate treatment he continued to decline and finally aspirated on 02/05/2020 and declined even further.  Palliative care was involved, after discussions with treatment team and son it was decided that patient will be best served on full comfort measures, was transition to morphine drip and passed away in comfort on 2020-02-28 at 12:16 PM.   Final Diagnoses:  Cause if death - Covid 37 Infection  Signature  Lala Lund M.D on 02-28-20 at 1:41 PM  [facility]  Office Phone [phone]  Total clinical and documentation time for today Under 30 minutes   Last Note                                                                       PROGRESS NOTE  Patient Demographics:    Ernest Clark, is a 87 y.o. male,  DOB - 1931/09/28, [id number]  Outpatient Primary MD for the patient is Hulan Fess, MD   Admit date - 01/06/2020   LOS - 8  Chief Complaint  Patient presents with  . Covid Positive       Brief Narrative: Patient is a 87 y.o. male with PMHx of CAD s/p PCI, PAD, HTN, HLD, COPD on home O2 2 L/min, DVT-presented to the hospital with acute on chronic hypoxic and hypercarbic respiratory failure due to HCAP/bacterial and COVID-19 infection.  Now on full comfort measures.  COVID-19 vaccinated status: Vaccinated  Significant Events:  1/27>> Admit to [facility] for acute hypoxic/hypercarbic respiratory failure-HCAP/COVID-19 infection  Significant studies: 1/27>> CTA chest: No PE, bilateral airspace opacity-right>> left   COVID-19 medications: Steroids: 1/27>> Remdesivir: 1/27>>  Antibiotics: Rocephin: 1/27>> Zithromax: 1/27>>  Microbiology data: 1/27 >>blood culture: No growth  Procedures: None  Consults: PCCM, Pall.Care  DVT prophylaxis:     Subjective:   Patient in bed although in no distress overall appears quite frail, deconditioned and somewhat confused, is able to follow minimal commands at best   Assessment  & Plan :   Patient admitted for acute COVID-19 infection causing pneumonia, respiratory failure and severe encephalopathy.  Despite appropriate treatment he continued to decline and finally aspirated on 02/05/2020 and declined even further.  Palliative care was involved, after discussions with treatment team and son it was decided that patient will be best served on full comfort measures which he is on now, expect passing away soon.  Full comfort measures only.  Only comfort medications on board.    Other medical issues addressed earlier this admission are below.  Now on full comfort measures only and only on comfort medications.      Acute on chronic hypoxemic and hypercarbic respiratory failure: Suspect this is mostly from bacterial/HCAP-likely aspiration  pneumonia-could have some element of COVID-19 infection.  Seems to have improved compared to on admission-back on usual home regimen of 2 L of oxygen.  Continue empiric antimicrobial therapy/steroids and Remdesivir.  Await culture data and narrow antibiotic spectrum accordingly.    Speech therapy following for safe diet.  Currently on nectar thick liquids with dysphagia 2 diet per speech.  O2 requirements:   SpO2: 93 % O2 Flow Rate (L/min): 13 L/min FiO2 (%): 100 %     Recent Labs  Lab 01/31/20 0852 02/01/20 0049 02/02/20 0116 02/03/20 0222 02/04/20 0445 02/05/20 0239  WBC 11.2* 8.7 7.0 7.3 8.2  --   HGB 12.6* 12.5* 12.4* 12.7* 12.2*  --   HCT 42.9 42.4 42.0 42.5 43.8  --   PLT 220 242 235 250 229  --   CRP 7.6* 5.3* 3.2* 2.0* 1.7*  --   BNP 531.7* 451.7* 319.4* 165.4* 108.7*  --   DDIMER 0.69* 0.48 0.59* 0.57* 0.61*  --   PROCALCITON <0.10 <0.10 <0.10 <0.10 <0.10 <0.10  AST  --  32 23 18 19   --   ALT  --  31 25 23 18   --   ALKPHOS  --  80 73 76 69  --   BILITOT  --  0.7 0.8 1.0 1.0  --   ALBUMIN  --  2.7* 2.6* 2.7* 2.5*  --       Prone/Incentive Spirometry: encouraged patient to lie prone for 3-4 hours at a time for a total of 16 hours a day, and to encourage incentive spirometry use 3-4/hour.  Sepsis due  to underlying COVID-19 pneumonia: Secondary to PNA-sepsis physiology has resolved.  So far cultures negative procalcitonin is undetectable hence will discontinue antibiotics on 02/01/2020.  Delirium/acute metabolic encephalopathy superimposed on dementia: Suspect delirium/metabolic encephalopathy due to hypoxemia/pneumonia.  On home dose Cymbalta and Remeron, received Haldol early morning 02/01/2020 and extremely somnolent after that, -ve head CT .  Continue supportive care, minimize benzodiazepines and narcotics as much as possible.  Since he is somnolent gentle hydration with IV fluids to continue, negative care is following, I do not see any meaningful improvement in his  mentation, although from the time of admission he is mildly improved but I do not think that he will be able to eat or drink safely with this mental status.  CAD/PAD: All stable-continue aspirin/Crestor  COPD: Not in flare-continue bronchodilators  Hypothyroidism: Continue levothyroxine  Obesity: BMI 32.  Follow with PCP.  DM-2: CBG stable-continue SSI  Lab Results  Component Value Date   HGBA1C 7.0 (H) 01/30/2020   Recent Labs    02/04/20 2312 02/05/20 0304 02/05/20 0741  GLUCAP 251* 199* 165*     Condition - Stable  Family Communication  :  Son - [phone] left voicemail on 01/30/20, left voicemail on 01/31/2020 at 9:27 AM, updated again 02/02/2020 at 11:10 AM, 02/15/20  Code Status :  DNR  Diet :  Diet Order            Diet NPO time specified  Diet effective now                  Disposition Plan  :   Status is: Inpatient  Remains inpatient appropriate because:Inpatient level of care appropriate due to severity of illness   Dispo: The patient is from: SNF              Anticipated d/c is to: SNF              Anticipated d/c date is: > 3 days              Patient currently is not medically stable to d/c.   Difficult to place patient No  Barriers to discharge: Hypoxia requiring O2 supplementation/complete 5 days of IV Remdesivir  Antimicorbials  :    Anti-infectives (From admission, onward)   Start     Dose/Rate Route Frequency Ordered Stop   01/30/20 1000  remdesivir 100 mg in sodium chloride 0.9 % 100 mL IVPB       "Followed by" Linked Group Details   100 mg 200 mL/hr over 30 Minutes Intravenous Daily 01/16/2020 1105 02/02/20 0942   01/04/2020 1200  remdesivir 200 mg in sodium chloride 0.9% 250 mL IVPB       "Followed by" Linked Group Details   200 mg 580 mL/hr over 30 Minutes Intravenous Once 01/24/2020 1105 01/17/2020 1405   01/26/2020 0830  cefTRIAXone (ROCEPHIN) 2 g in sodium chloride 0.9 % 100 mL IVPB  Status:  Discontinued        2 g 200 mL/hr over 30  Minutes Intravenous Every 24 hours 01/14/2020 0805 02/01/20 1133   01/24/2020 0815  azithromycin (ZITHROMAX) 500 mg in sodium chloride 0.9 % 250 mL IVPB  Status:  Discontinued        500 mg 250 mL/hr over 60 Minutes Intravenous Every 24 hours 01/19/2020 0805 02/01/20 1133   01/07/2020 0700  ceFEPIme (MAXIPIME) 2 g in sodium chloride 0.9 % 100 mL IVPB  Status:  Discontinued        2 g  200 mL/hr over 30 Minutes Intravenous Every 8 hours 01/18/2020 0656 01/28/2020 0805      Inpatient Medications  Scheduled Meds: . feeding supplement (GLUCERNA SHAKE)  237 mL Oral TID BM  . glycopyrrolate  0.2 mg Intravenous Q6H  .  morphine injection  1 mg Intravenous Q6H   Continuous Infusions:  PRN Meds:.bisacodyl, glycopyrrolate, haloperidol lactate, Ipratropium-Albuterol, LORazepam, morphine injection, Resource ThickenUp Clear   Time Spent in minutes  25  See all Orders from today for further details   Lala Lund M.D on 02/24/2020 at 1:42 PM  To page go to [email or web address] - use universal password  [facility] -  Office  [phone]    Objective:   Vitals:   02/05/20 2015 02/05/20 2043 February 24, 2020 0820 Feb 24, 2020 1237  BP:  139/81 140/76   Pulse:  97 99   Resp:  20 18   Temp:  97.8 F (36.6 C) 97.7 F (36.5 C)   TempSrc:      SpO2: 92% 95% 93%   Weight:    95.7 kg  Height:    5\' 8"  (1.727 m)    Wt Readings from Last 3 Encounters:  2020/02/24 95.7 kg  09/17/19 95.7 kg  07/22/19 103 kg     Intake/Output Summary (Last 24 hours) at 02-24-2020 1342 Last data filed at 02/05/2020 1400 Gross per 24 hour  Intake -  Output 600 ml  Net -600 ml     Physical Exam  Patient awake but minimally responsive, on full comfort measures, appears to be in no distress   Data Review:    CBC Recent Labs  Lab 01/31/20 0852 02/01/20 0049 02/02/20 0116 02/03/20 0222 02/04/20 0445  WBC 11.2* 8.7 7.0 7.3 8.2  HGB 12.6* 12.5* 12.4* 12.7* 12.2*  HCT 42.9 42.4 42.0 42.5 43.8  PLT 220 242 235 250 229   MCV 97.9 97.0 97.0 98.8 102.8*  MCH 28.8 28.6 28.6 29.5 28.6  MCHC 29.4* 29.5* 29.5* 29.9* 27.9*  RDW 16.4* 16.2* 16.2* 16.6* 16.4*  LYMPHSABS  --  0.6* 0.7 1.0 0.8  MONOABS  --  0.7 0.7 0.7 0.6  EOSABS  --  0.0 0.0 0.0 0.0  BASOSABS  --  0.0 0.0 0.1 0.1    Chemistries  Recent Labs  Lab 01/31/20 0852 02/01/20 0049 02/02/20 0116 02/03/20 0222 02/04/20 0445  NA 141 140 142 144 144  K 4.1 3.7 3.7 3.9 3.5  CL 97* 95* 98 97* 98  CO2 31 34* 35* 40* 39*  GLUCOSE 187* 161* 169* 173* 158*  BUN 17 17 17 18 16   CREATININE 0.78 0.85 0.86 1.02 1.00  CALCIUM 8.8* 8.9 8.7* 8.7* 8.8*  MG 2.2 2.2 2.2 2.4 2.4  AST  --  32 23 18 19   ALT  --  31 25 23 18   ALKPHOS  --  80 73 76 69  BILITOT  --  0.7 0.8 1.0 1.0   ------------------------------------------------------------------------------------------------------------------ No results for input(s): CHOL, HDL, LDLCALC, TRIG, CHOLHDL, LDLDIRECT in the last 72 hours.  Lab Results  Component Value Date   HGBA1C 7.0 (H) 01/30/2020   ------------------------------------------------------------------------------------------------------------------ No results for input(s): TSH, T4TOTAL, T3FREE, THYROIDAB in the last 72 hours.  Invalid input(s): FREET3 ------------------------------------------------------------------------------------------------------------------ No results for input(s): VITAMINB12, FOLATE, FERRITIN, TIBC, IRON, RETICCTPCT in the last 72 hours.  Coagulation profile No results for input(s): INR, PROTIME in the last 168 hours.  Recent Labs    02/04/20 0445  DDIMER 0.61*    Cardiac Enzymes No results for input(s): CKMB,  TROPONINI, MYOGLOBIN in the last 168 hours.  Invalid input(s): CK ------------------------------------------------------------------------------------------------------------------    Component Value Date/Time   BNP 108.7 (H) 02/04/2020 0445    Micro Results Recent Results (from the past 240  hour(s))  Culture, blood (x 2)     Status: None   Collection Time: 03-Feb-2020  8:00 AM   Specimen: BLOOD RIGHT FOREARM  Result Value Ref Range Status   Specimen Description BLOOD RIGHT FOREARM  Final   Special Requests   Final    BOTTLES DRAWN AEROBIC AND ANAEROBIC Blood Culture results may not be optimal due to an excessive volume of blood received in culture bottles   Culture   Final    NO GROWTH 5 DAYS Performed at [facility], [place] [address], [place], [place] [address]    Report Status 02/03/2020 FINAL  Final  Culture, blood (x 2)     Status: None   Collection Time: 02-03-20 12:58 PM   Specimen: BLOOD  Result Value Ref Range Status   Specimen Description BLOOD LEFT ANTECUBITAL  Final   Special Requests   Final    BOTTLES DRAWN AEROBIC AND ANAEROBIC Blood Culture adequate volume   Culture   Final    NO GROWTH 5 DAYS Performed at [facility], [place] [address] [place], [place] [address]    Report Status 02/03/2020 FINAL  Final  SARS CORONAVIRUS 2 (TAT 6-24 HRS) Nasopharyngeal Nasopharyngeal Swab     Status: Abnormal   Collection Time: 02-03-2020  1:25 PM   Specimen: Nasopharyngeal Swab  Result Value Ref Range Status   SARS Coronavirus 2 POSITIVE (A) NEGATIVE Final    Comment: (NOTE) SARS-CoV-2 target nucleic acids are DETECTED.  The SARS-CoV-2 RNA is generally detectable in upper and lower respiratory specimens during the acute phase of infection. Positive results are indicative of the presence of SARS-CoV-2 RNA. Clinical correlation with patient history and other diagnostic information is  necessary to determine patient infection status. Positive results do not rule out bacterial infection or co-infection with other viruses.  The expected result is Negative.  Fact Sheet for Patients: SugarRoll.be  Fact Sheet for Healthcare Providers: https://www.woods-mathews.com/  This test is not yet approved or cleared by the [place] FDA and  has been authorized for detection and/or diagnosis of SARS-CoV-2 by FDA under an Emergency Use Authorization (EUA). This EUA will remain  in effect (meaning this test can be used) for the duration of the COVID-19 declaration under Section 564(b)(1) of the Act, 21 U. S.C. section 360bbb-3(b)(1), unless the authorization is terminated or revoked sooner.   Performed at [facility], [place] [address], [place], [place] [address]     Radiology Reports CT HEAD WO CONTRAST  Result Date: 02/01/2020 CLINICAL DATA:  Delirium EXAM: CT HEAD WITHOUT CONTRAST TECHNIQUE: Contiguous axial images were obtained from the base of the skull through the vertex without intravenous contrast. COMPARISON:  CT head 07/14/2019 FINDINGS: Brain: Image quality degraded by extensive motion Generalized atrophy.  No acute infarct, hemorrhage, mass Vascular: Carotid and vertebral artery calcification. Negative for hyperdense vessel Skull: Negative Sinuses/Orbits: Mucosal edema paranasal sinuses. Bilateral cataract extraction Other: None IMPRESSION: Image quality degraded by extensive motion. No acute abnormality identified Electronically Signed   By: Franchot Gallo M.D.   On: 02/01/2020 14:56   CT Angio Chest PE W and/or Wo Contrast  Result Date: 02/03/2020 CLINICAL DATA:  Positive D-dimer and COVID-19 pneumonia, initial encounter EXAM: CT ANGIOGRAPHY CHEST WITH CONTRAST TECHNIQUE: Multidetector CT  imaging of the chest was performed using the standard protocol during bolus administration of intravenous contrast. Multiplanar CT image reconstructions and MIPs were obtained to evaluate the vascular anatomy. CONTRAST:  36mL OMNIPAQUE IOHEXOL 350 MG/ML SOLN COMPARISON:  Chest x-ray from earlier in the same day, CT from 07/03/2017 FINDINGS: Cardiovascular: Thoracic aorta and its branches demonstrate atherosclerotic calcifications. No aneurysmal dilatation is seen. The degree of opacification of the aorta is limited.  No cardiac enlargement is seen. Mitral and aortic valve calcifications are seen. Heavy coronary calcifications are noted. The pulmonary artery shows a normal branching pattern. No filling defect to suggest pulmonary embolism is noted. Mediastinum/Nodes: Thoracic inlet is within normal limits. No sizable hilar or mediastinal adenopathy is noted. Small precarinal nodes are seen. The esophagus as visualized is within normal limits. Lungs/Pleura: Lungs are well aerated bilaterally with patchy airspace opacities primarily in the lower lobes with some volume loss on the right. No sizable effusion or pneumothorax is seen. No parenchymal nodules are noted. Upper Abdomen: Visualized upper abdomen is within normal limits. Musculoskeletal: Degenerative changes of the thoracic spine are noted. No acute rib abnormality is seen. Review of the MIP images confirms the above findings. IMPRESSION: No evidence of pulmonary emboli. Bilateral airspace opacity slightly worse on the right than the left similar to that seen on prior chest x-ray consistent with the given clinical history. Aortic Atherosclerosis (ICD10-I70.0). Electronically Signed   By: Inez Catalina M.D.   On: 01/31/2020 10:58   DG Chest Port 1 View  Result Date: 02/05/2020 CLINICAL DATA:  Shortness of breath EXAM: PORTABLE CHEST 1 VIEW COMPARISON:  01/31/2020 FINDINGS: Cardiopericardial enlargement. Bilateral pneumonia by recent chest CT. Volumes have worsened and hazy density is increased especially on the left. No pneumothorax. IMPRESSION: Bilateral pneumonia with worsening lung volumes and increased left chest opacity. Electronically Signed   By: Monte Fantasia M.D.   On: 02/05/2020 09:23   DG Chest Port 1 View  Result Date: 01/31/2020 CLINICAL DATA:  Shortness of breath.  COVID positive. EXAM: PORTABLE CHEST 1 VIEW COMPARISON:  Chest x-rays dated 01/03/2020 and 07/20/2019. Chest CT dated 01/08/2020. FINDINGS: Bilateral airspace opacities, RIGHT greater than  LEFT, not significantly changed. Heart size and mediastinal contours appear stable. No pleural effusion or pneumothorax is seen. IMPRESSION: Stable bilateral multifocal pneumonia, RIGHT greater than LEFT. Electronically Signed   By: Franki Cabot M.D.   On: 01/31/2020 09:04   DG Chest Port 1 View  Result Date: 01/10/2020 CLINICAL DATA:  COVID pneumonia, dyspnea EXAM: PORTABLE CHEST 1 VIEW COMPARISON:  07/20/2019 FINDINGS: The lungs are symmetrically inflated. Elevation of the left hemidiaphragm is again noted. There is right mid and lower lung zone consolidation, new since prior examination, possibly infectious in the acute setting. Note that aspiration or pulmonary hemorrhage could appear similarly. Mild left basilar atelectasis or infiltrate noted. No pneumothorax or pleural effusion. Mild cardiomegaly is unchanged. IMPRESSION: Right mid and lower lung zone consolidation, in keeping with acute lobar pneumonia in the appropriate clinical setting. Probable left basilar atelectasis with associated stable elevation of the left hemidiaphragm. Electronically Signed   By: Fidela Salisbury MD   On: 01/09/2020 05:06

## 2020-03-02 DEATH — deceased
